# Patient Record
Sex: Female | Born: 1935 | Race: White | Hispanic: No | Smoking: Never smoker
Health system: Southern US, Community
[De-identification: ages and names within clinical notes are randomized; demographics above are authoritative.]

## PROBLEM LIST (undated history)

## (undated) DIAGNOSIS — R32 Unspecified urinary incontinence: Secondary | ICD-10-CM

## (undated) DIAGNOSIS — R600 Localized edema: Secondary | ICD-10-CM

## (undated) DIAGNOSIS — K559 Vascular disorder of intestine, unspecified: Secondary | ICD-10-CM

## (undated) DIAGNOSIS — D649 Anemia, unspecified: Secondary | ICD-10-CM

## (undated) DIAGNOSIS — J309 Allergic rhinitis, unspecified: Secondary | ICD-10-CM

## (undated) DIAGNOSIS — C4491 Basal cell carcinoma of skin, unspecified: Secondary | ICD-10-CM

## (undated) DIAGNOSIS — Z7901 Long term (current) use of anticoagulants: Secondary | ICD-10-CM

## (undated) DIAGNOSIS — Z905 Acquired absence of kidney: Secondary | ICD-10-CM

## (undated) DIAGNOSIS — I5032 Chronic diastolic (congestive) heart failure: Secondary | ICD-10-CM

## (undated) DIAGNOSIS — H409 Unspecified glaucoma: Secondary | ICD-10-CM

## (undated) DIAGNOSIS — L120 Bullous pemphigoid: Secondary | ICD-10-CM

## (undated) DIAGNOSIS — I2510 Atherosclerotic heart disease of native coronary artery without angina pectoris: Secondary | ICD-10-CM

## (undated) DIAGNOSIS — I10 Essential (primary) hypertension: Secondary | ICD-10-CM

## (undated) DIAGNOSIS — I639 Cerebral infarction, unspecified: Secondary | ICD-10-CM

## (undated) DIAGNOSIS — I482 Chronic atrial fibrillation, unspecified: Secondary | ICD-10-CM

## (undated) DIAGNOSIS — E039 Hypothyroidism, unspecified: Secondary | ICD-10-CM

## (undated) DIAGNOSIS — I82409 Acute embolism and thrombosis of unspecified deep veins of unspecified lower extremity: Secondary | ICD-10-CM

## (undated) DIAGNOSIS — Z952 Presence of prosthetic heart valve: Secondary | ICD-10-CM

## (undated) DIAGNOSIS — D696 Thrombocytopenia, unspecified: Secondary | ICD-10-CM

## (undated) HISTORY — PX: LAPAROSCOPIC SMALL BOWEL RESECTION: SUR793

## (undated) HISTORY — PX: HERNIA REPAIR: SHX51

## (undated) HISTORY — DX: Localized edema: R60.0

## (undated) HISTORY — PX: OTHER SURGICAL HISTORY: SHX169

## (undated) HISTORY — PX: APPENDECTOMY: SHX54

## (undated) HISTORY — PX: ABDOMINAL HYSTERECTOMY: SHX81

---
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
## 1998-07-29 ENCOUNTER — Ambulatory Visit (HOSPITAL_COMMUNITY): Admission: RE | Admit: 1998-07-29 | Discharge: 1998-07-30 | Payer: Self-pay | Admitting: Cardiology

## 1998-12-30 ENCOUNTER — Ambulatory Visit (HOSPITAL_COMMUNITY): Admission: RE | Admit: 1998-12-30 | Discharge: 1998-12-30 | Payer: Self-pay | Admitting: Internal Medicine

## 1998-12-30 ENCOUNTER — Encounter: Payer: Self-pay | Admitting: Internal Medicine

## 1999-02-08 ENCOUNTER — Encounter: Payer: Self-pay | Admitting: General Surgery

## 1999-02-09 ENCOUNTER — Ambulatory Visit (HOSPITAL_COMMUNITY): Admission: RE | Admit: 1999-02-09 | Discharge: 1999-02-09 | Payer: Self-pay | Admitting: General Surgery

## 1999-04-25 DIAGNOSIS — Z952 Presence of prosthetic heart valve: Secondary | ICD-10-CM

## 1999-04-25 HISTORY — DX: Presence of prosthetic heart valve: Z95.2

## 1999-11-16 ENCOUNTER — Ambulatory Visit (HOSPITAL_COMMUNITY): Admission: RE | Admit: 1999-11-16 | Discharge: 1999-11-16 | Payer: Self-pay | Admitting: Cardiology

## 2000-01-23 HISTORY — PX: MITRAL VALVE REPLACEMENT: SHX147

## 2000-01-23 HISTORY — PX: CORONARY ARTERY BYPASS GRAFT: SHX141

## 2000-01-25 ENCOUNTER — Ambulatory Visit (HOSPITAL_COMMUNITY): Admission: RE | Admit: 2000-01-25 | Discharge: 2000-01-25 | Payer: Self-pay | Admitting: *Deleted

## 2000-02-14 ENCOUNTER — Encounter: Payer: Self-pay | Admitting: Surgery

## 2000-02-16 ENCOUNTER — Inpatient Hospital Stay (HOSPITAL_COMMUNITY): Admission: RE | Admit: 2000-02-16 | Discharge: 2000-02-22 | Payer: Self-pay | Admitting: Surgery

## 2000-02-16 ENCOUNTER — Encounter: Payer: Self-pay | Admitting: Surgery

## 2000-02-17 ENCOUNTER — Encounter: Payer: Self-pay | Admitting: Surgery

## 2000-02-18 ENCOUNTER — Encounter: Payer: Self-pay | Admitting: Surgery

## 2000-03-14 ENCOUNTER — Encounter: Admission: RE | Admit: 2000-03-14 | Discharge: 2000-03-14 | Payer: Self-pay | Admitting: Surgery

## 2000-03-14 ENCOUNTER — Encounter: Payer: Self-pay | Admitting: Surgery

## 2000-03-16 ENCOUNTER — Inpatient Hospital Stay (HOSPITAL_COMMUNITY): Admission: AD | Admit: 2000-03-16 | Discharge: 2000-03-20 | Payer: Self-pay | Admitting: Surgery

## 2000-03-17 ENCOUNTER — Encounter: Payer: Self-pay | Admitting: Surgery

## 2000-03-18 ENCOUNTER — Encounter: Payer: Self-pay | Admitting: Cardiothoracic Surgery

## 2000-03-19 ENCOUNTER — Encounter: Payer: Self-pay | Admitting: Cardiothoracic Surgery

## 2000-03-20 ENCOUNTER — Encounter: Payer: Self-pay | Admitting: Surgery

## 2000-03-27 ENCOUNTER — Encounter: Admission: RE | Admit: 2000-03-27 | Discharge: 2000-03-27 | Payer: Self-pay | Admitting: Surgery

## 2000-03-27 ENCOUNTER — Encounter: Payer: Self-pay | Admitting: Surgery

## 2000-04-10 ENCOUNTER — Encounter: Payer: Self-pay | Admitting: Surgery

## 2000-04-10 ENCOUNTER — Encounter: Admission: RE | Admit: 2000-04-10 | Discharge: 2000-04-10 | Payer: Self-pay | Admitting: Surgery

## 2002-01-16 ENCOUNTER — Ambulatory Visit (HOSPITAL_COMMUNITY): Admission: RE | Admit: 2002-01-16 | Discharge: 2002-01-16 | Payer: Self-pay | Admitting: *Deleted

## 2002-07-07 ENCOUNTER — Ambulatory Visit (HOSPITAL_BASED_OUTPATIENT_CLINIC_OR_DEPARTMENT_OTHER): Admission: RE | Admit: 2002-07-07 | Discharge: 2002-07-07 | Payer: Self-pay | Admitting: General Surgery

## 2003-04-01 ENCOUNTER — Inpatient Hospital Stay (HOSPITAL_COMMUNITY): Admission: EM | Admit: 2003-04-01 | Discharge: 2003-04-12 | Payer: Self-pay | Admitting: Emergency Medicine

## 2003-06-04 ENCOUNTER — Ambulatory Visit (HOSPITAL_COMMUNITY): Admission: RE | Admit: 2003-06-04 | Discharge: 2003-06-04 | Payer: Self-pay | Admitting: Cardiology

## 2010-07-22 ENCOUNTER — Other Ambulatory Visit: Payer: Self-pay | Admitting: Dermatology

## 2011-02-15 ENCOUNTER — Other Ambulatory Visit: Payer: Self-pay | Admitting: Dermatology

## 2012-02-23 HISTORY — PX: TRANSTHORACIC ECHOCARDIOGRAM: SHX275

## 2012-03-01 ENCOUNTER — Inpatient Hospital Stay (HOSPITAL_COMMUNITY): Payer: Medicare Other

## 2012-03-01 ENCOUNTER — Observation Stay (HOSPITAL_COMMUNITY)
Admission: EM | Admit: 2012-03-01 | Discharge: 2012-03-04 | Disposition: A | Discharge status: Home / Self Care | Payer: Medicare Other | Attending: Internal Medicine | Admitting: Internal Medicine

## 2012-03-01 ENCOUNTER — Encounter (HOSPITAL_COMMUNITY): Payer: Self-pay | Admitting: Emergency Medicine

## 2012-03-01 DIAGNOSIS — L0390 Cellulitis, unspecified: Secondary | ICD-10-CM

## 2012-03-01 DIAGNOSIS — Z951 Presence of aortocoronary bypass graft: Secondary | ICD-10-CM | POA: Insufficient documentation

## 2012-03-01 DIAGNOSIS — Z7901 Long term (current) use of anticoagulants: Secondary | ICD-10-CM

## 2012-03-01 DIAGNOSIS — I482 Chronic atrial fibrillation, unspecified: Secondary | ICD-10-CM | POA: Diagnosis present

## 2012-03-01 DIAGNOSIS — L120 Bullous pemphigoid: Secondary | ICD-10-CM

## 2012-03-01 DIAGNOSIS — L03115 Cellulitis of right lower limb: Secondary | ICD-10-CM

## 2012-03-01 DIAGNOSIS — C4491 Basal cell carcinoma of skin, unspecified: Secondary | ICD-10-CM

## 2012-03-01 DIAGNOSIS — K559 Vascular disorder of intestine, unspecified: Secondary | ICD-10-CM

## 2012-03-01 DIAGNOSIS — Z905 Acquired absence of kidney: Secondary | ICD-10-CM | POA: Insufficient documentation

## 2012-03-01 DIAGNOSIS — I82409 Acute embolism and thrombosis of unspecified deep veins of unspecified lower extremity: Secondary | ICD-10-CM

## 2012-03-01 DIAGNOSIS — I4891 Unspecified atrial fibrillation: Secondary | ICD-10-CM

## 2012-03-01 DIAGNOSIS — K219 Gastro-esophageal reflux disease without esophagitis: Secondary | ICD-10-CM | POA: Insufficient documentation

## 2012-03-01 DIAGNOSIS — R32 Unspecified urinary incontinence: Secondary | ICD-10-CM

## 2012-03-01 DIAGNOSIS — I10 Essential (primary) hypertension: Secondary | ICD-10-CM | POA: Insufficient documentation

## 2012-03-01 DIAGNOSIS — H409 Unspecified glaucoma: Secondary | ICD-10-CM

## 2012-03-01 DIAGNOSIS — J309 Allergic rhinitis, unspecified: Secondary | ICD-10-CM

## 2012-03-01 DIAGNOSIS — Z952 Presence of prosthetic heart valve: Secondary | ICD-10-CM

## 2012-03-01 DIAGNOSIS — L129 Pemphigoid, unspecified: Secondary | ICD-10-CM | POA: Insufficient documentation

## 2012-03-01 DIAGNOSIS — Z79899 Other long term (current) drug therapy: Secondary | ICD-10-CM | POA: Insufficient documentation

## 2012-03-01 DIAGNOSIS — E079 Disorder of thyroid, unspecified: Secondary | ICD-10-CM

## 2012-03-01 DIAGNOSIS — Z954 Presence of other heart-valve replacement: Secondary | ICD-10-CM | POA: Insufficient documentation

## 2012-03-01 DIAGNOSIS — E039 Hypothyroidism, unspecified: Secondary | ICD-10-CM | POA: Diagnosis present

## 2012-03-01 DIAGNOSIS — R509 Fever, unspecified: Secondary | ICD-10-CM

## 2012-03-01 DIAGNOSIS — D649 Anemia, unspecified: Secondary | ICD-10-CM

## 2012-03-01 DIAGNOSIS — L02419 Cutaneous abscess of limb, unspecified: Principal | ICD-10-CM | POA: Insufficient documentation

## 2012-03-01 DIAGNOSIS — Z86718 Personal history of other venous thrombosis and embolism: Secondary | ICD-10-CM | POA: Insufficient documentation

## 2012-03-01 DIAGNOSIS — L03119 Cellulitis of unspecified part of limb: Principal | ICD-10-CM | POA: Insufficient documentation

## 2012-03-01 DIAGNOSIS — I2510 Atherosclerotic heart disease of native coronary artery without angina pectoris: Secondary | ICD-10-CM | POA: Insufficient documentation

## 2012-03-01 DIAGNOSIS — I509 Heart failure, unspecified: Secondary | ICD-10-CM

## 2012-03-01 HISTORY — DX: Basal cell carcinoma of skin, unspecified: C44.91

## 2012-03-01 HISTORY — DX: Essential (primary) hypertension: I10

## 2012-03-01 HISTORY — DX: Vascular disorder of intestine, unspecified: K55.9

## 2012-03-01 HISTORY — DX: Unspecified urinary incontinence: R32

## 2012-03-01 HISTORY — DX: Atherosclerotic heart disease of native coronary artery without angina pectoris: I25.10

## 2012-03-01 HISTORY — DX: Acquired absence of kidney: Z90.5

## 2012-03-01 HISTORY — DX: Long term (current) use of anticoagulants: Z79.01

## 2012-03-01 HISTORY — DX: Presence of prosthetic heart valve: Z95.2

## 2012-03-01 HISTORY — DX: Unspecified glaucoma: H40.9

## 2012-03-01 HISTORY — DX: Hypothyroidism, unspecified: E03.9

## 2012-03-01 HISTORY — DX: Acute embolism and thrombosis of unspecified deep veins of unspecified lower extremity: I82.409

## 2012-03-01 HISTORY — DX: Allergic rhinitis, unspecified: J30.9

## 2012-03-01 HISTORY — DX: Bullous pemphigoid: L12.0

## 2012-03-01 LAB — CBC WITH DIFFERENTIAL/PLATELET
Basophils Absolute: 0 10*3/uL (ref 0.0–0.1)
Basophils Relative: 0 % (ref 0–1)
Eosinophils Absolute: 0 10*3/uL (ref 0.0–0.7)
Eosinophils Relative: 0 % (ref 0–5)
HCT: 39.2 % (ref 36.0–46.0)
Hemoglobin: 13.7 g/dL (ref 12.0–15.0)
Lymphocytes Relative: 6 % — ABNORMAL LOW (ref 12–46)
Lymphs Abs: 0.7 10*3/uL (ref 0.7–4.0)
MCH: 31.2 pg (ref 26.0–34.0)
MCHC: 34.9 g/dL (ref 30.0–36.0)
MCV: 89.3 fL (ref 78.0–100.0)
Monocytes Absolute: 0.9 10*3/uL (ref 0.1–1.0)
Monocytes Relative: 8 % (ref 3–12)
Neutro Abs: 9.7 10*3/uL — ABNORMAL HIGH (ref 1.7–7.7)
Neutrophils Relative %: 86 % — ABNORMAL HIGH (ref 43–77)
Platelets: 159 10*3/uL (ref 150–400)
RBC: 4.39 MIL/uL (ref 3.87–5.11)
RDW: 13.6 % (ref 11.5–15.5)
WBC: 11.3 10*3/uL — ABNORMAL HIGH (ref 4.0–10.5)

## 2012-03-01 LAB — COMPREHENSIVE METABOLIC PANEL
ALT: 20 U/L (ref 0–35)
AST: 33 U/L (ref 0–37)
Albumin: 4 g/dL (ref 3.5–5.2)
Alkaline Phosphatase: 62 U/L (ref 39–117)
BUN: 15 mg/dL (ref 6–23)
CO2: 28 mEq/L (ref 19–32)
Calcium: 9.6 mg/dL (ref 8.4–10.5)
Chloride: 96 mEq/L (ref 96–112)
Creatinine, Ser: 0.83 mg/dL (ref 0.50–1.10)
GFR calc Af Amer: 72 mL/min — ABNORMAL LOW (ref >90)
GFR calc non Af Amer: 62 mL/min — ABNORMAL LOW (ref >90)
Glucose, Bld: 96 mg/dL (ref 70–99)
Potassium: 4 mEq/L (ref 3.5–5.1)
Sodium: 134 mEq/L — ABNORMAL LOW (ref 135–145)
Total Bilirubin: 0.8 mg/dL (ref 0.3–1.2)
Total Protein: 7.2 g/dL (ref 6.0–8.3)

## 2012-03-01 LAB — URINALYSIS, ROUTINE W REFLEX MICROSCOPIC
Bilirubin Urine: NEGATIVE
Ketones, ur: NEGATIVE mg/dL
Nitrite: NEGATIVE
pH: 7.5 (ref 5.0–8.0)

## 2012-03-01 LAB — PROTIME-INR
INR: 3.33 — ABNORMAL HIGH (ref 0.00–1.49)
Prothrombin Time: 31.9 seconds — ABNORMAL HIGH (ref 11.6–15.2)

## 2012-03-01 LAB — LACTIC ACID, PLASMA: Lactic Acid, Venous: 1.3 mmol/L (ref 0.5–2.2)

## 2012-03-01 LAB — URINE MICROSCOPIC-ADD ON

## 2012-03-01 MED ORDER — ATENOLOL 50 MG PO TABS
50.0000 mg | ORAL_TABLET | Freq: Every day | ORAL | Status: DC
  Administered 2012-03-02 – 2012-03-04 (×3): 50 mg via ORAL
  Filled 2012-03-01 (×3): qty 1

## 2012-03-01 MED ORDER — POTASSIUM CHLORIDE CRYS ER 20 MEQ PO TBCR
20.0000 meq | EXTENDED_RELEASE_TABLET | Freq: Every day | ORAL | Status: DC
  Filled 2012-03-01: qty 1

## 2012-03-01 MED ORDER — ONDANSETRON HCL 4 MG PO TABS: 4.0000 mg | ORAL_TABLET | Freq: Four times a day (QID) | ORAL | Status: DC | PRN

## 2012-03-01 MED ORDER — FUROSEMIDE 20 MG PO TABS
20.0000 mg | ORAL_TABLET | Freq: Every day | ORAL | Status: DC
  Administered 2012-03-02 – 2012-03-04 (×3): 20 mg via ORAL
  Filled 2012-03-01 (×3): qty 1

## 2012-03-01 MED ORDER — CLINDAMYCIN PHOSPHATE 900 MG/50ML IV SOLN
900.0000 mg | Freq: Three times a day (TID) | INTRAVENOUS | Status: DC
  Administered 2012-03-02 – 2012-03-04 (×7): 900 mg via INTRAVENOUS
  Filled 2012-03-01 (×10): qty 50

## 2012-03-01 MED ORDER — FOLIC ACID 400 MCG PO TABS: 400.0000 ug | ORAL_TABLET | Freq: Every day | ORAL | Status: DC

## 2012-03-01 MED ORDER — FOLIC ACID 0.5 MG HALF TAB
0.5000 mg | ORAL_TABLET | Freq: Every day | ORAL | Status: DC
  Administered 2012-03-02 – 2012-03-04 (×3): 0.5 mg via ORAL
  Filled 2012-03-01 (×3): qty 1

## 2012-03-01 MED ORDER — TRAMADOL HCL 50 MG PO TABS: 50.0000 mg | ORAL_TABLET | Freq: Four times a day (QID) | ORAL | Status: DC | PRN

## 2012-03-01 MED ORDER — ONDANSETRON HCL 4 MG/2ML IJ SOLN: 4.0000 mg | Freq: Three times a day (TID) | INTRAMUSCULAR | Status: DC | PRN

## 2012-03-01 MED ORDER — DOCUSATE SODIUM 100 MG PO CAPS
100.0000 mg | ORAL_CAPSULE | Freq: Two times a day (BID) | ORAL | Status: DC
  Administered 2012-03-01 – 2012-03-04 (×6): 100 mg via ORAL
  Filled 2012-03-01 (×7): qty 1

## 2012-03-01 MED ORDER — LATANOPROST 0.005 % OP SOLN
1.0000 [drp] | Freq: Every day | OPHTHALMIC | Status: DC
  Administered 2012-03-01: 1 [drp] via OPHTHALMIC
  Filled 2012-03-01: qty 2.5

## 2012-03-01 MED ORDER — ACETAMINOPHEN 325 MG PO TABS
650.0000 mg | ORAL_TABLET | Freq: Four times a day (QID) | ORAL | Status: DC | PRN
  Administered 2012-03-01 (×2): 650 mg via ORAL
  Filled 2012-03-01 (×2): qty 2

## 2012-03-01 MED ORDER — CALCIUM CARBONATE-VITAMIN D 500-200 MG-UNIT PO TABS
1.0000 | ORAL_TABLET | Freq: Every day | ORAL | Status: DC
  Administered 2012-03-02 – 2012-03-04 (×3): 1 via ORAL
  Filled 2012-03-01 (×3): qty 1

## 2012-03-01 MED ORDER — LEVOTHYROXINE SODIUM 25 MCG PO TABS
25.0000 ug | ORAL_TABLET | Freq: Every day | ORAL | Status: DC
  Administered 2012-03-02 – 2012-03-04 (×3): 25 ug via ORAL
  Filled 2012-03-01 (×5): qty 1

## 2012-03-01 MED ORDER — CLINDAMYCIN PHOSPHATE 900 MG/50ML IV SOLN
900.0000 mg | Freq: Once | INTRAVENOUS | Status: AC
  Administered 2012-03-01: 900 mg via INTRAVENOUS
  Filled 2012-03-01: qty 50

## 2012-03-01 MED ORDER — VANCOMYCIN HCL 500 MG IV SOLR
500.0000 mg | Freq: Two times a day (BID) | INTRAVENOUS | Status: DC
  Administered 2012-03-02 – 2012-03-04 (×5): 500 mg via INTRAVENOUS
  Filled 2012-03-01 (×6): qty 500

## 2012-03-01 MED ORDER — WARFARIN 0.5 MG HALF TABLET: 0.5000 mg | ORAL_TABLET | ORAL | Status: DC

## 2012-03-01 MED ORDER — MINOCYCLINE HCL 100 MG PO CAPS
100.0000 mg | ORAL_CAPSULE | Freq: Every day | ORAL | Status: DC
  Filled 2012-03-01: qty 1

## 2012-03-01 MED ORDER — VANCOMYCIN HCL IN DEXTROSE 1-5 GM/200ML-% IV SOLN
1000.0000 mg | INTRAVENOUS | Status: AC
  Administered 2012-03-01: 1000 mg via INTRAVENOUS
  Filled 2012-03-01: qty 200

## 2012-03-01 MED ORDER — NIACINAMIDE 500 MG PO TABS
500.0000 mg | ORAL_TABLET | Freq: Two times a day (BID) | ORAL | Status: DC
  Administered 2012-03-02 – 2012-03-04 (×5): 500 mg via ORAL

## 2012-03-01 MED ORDER — AMLODIPINE BESYLATE 5 MG PO TABS
5.0000 mg | ORAL_TABLET | Freq: Every day | ORAL | Status: DC
  Administered 2012-03-02 – 2012-03-04 (×3): 5 mg via ORAL
  Filled 2012-03-01 (×3): qty 1

## 2012-03-01 MED ORDER — MAGNESIUM HYDROXIDE 400 MG/5ML PO SUSP
30.0000 mL | Freq: Every day | ORAL | Status: DC | PRN
  Filled 2012-03-01: qty 30

## 2012-03-01 MED ORDER — DIGOXIN 250 MCG PO TABS
0.2500 mg | ORAL_TABLET | Freq: Every day | ORAL | Status: DC
Start: 2012-03-02 — End: 2012-03-04
  Administered 2012-03-02 – 2012-03-04 (×3): 0.25 mg via ORAL
  Filled 2012-03-01 (×3): qty 1

## 2012-03-01 MED ORDER — ADULT MULTIVITAMIN W/MINERALS CH
1.0000 | ORAL_TABLET | Freq: Every day | ORAL | Status: DC
  Administered 2012-03-02 – 2012-03-04 (×3): 1 via ORAL
  Filled 2012-03-01 (×3): qty 1

## 2012-03-01 MED ORDER — ONDANSETRON HCL 4 MG/2ML IJ SOLN: 4.0000 mg | Freq: Four times a day (QID) | INTRAMUSCULAR | Status: DC | PRN

## 2012-03-01 NOTE — H&P (Addendum)
[facility] History and Physical  Amanda Richardson ZOX:[id number] DOB: 1923-09-23 DOA: 03/01/2012  Referring physician: Dr. Juleen China PCP: Amanda Fick, MD Dr. Alesia Richardson  Chief Complaint: Cellulitis  HPI:   The patient is an 3-yo F with history of CAD, CHF, Mitral valve replacement, HTN, atrial fibrillation, hypothyroidism and bullous pemphigoid who presents with right lower extremity pain and redness and swelling.  The patient states that she has had long standing problems with her legs, including chronic lower extremity edema which has been getting progressively worse over the last year, particularly over the last month, and bullae secondary to bullous pemphigoid which have been worse over the last 4-5 months since she decreased her dose of minocycline.  She states she was in her normal state of health about one week ago, except for increased lower extremity swelling.  7 days ago, she cut her right leg on a branch of a plant, possibly a rose bush.  She had some bleeding from the area that stopped quickly, but since that cut happened, she has had continuous and increasing serous drainage from her right medial leg.  The area appeared to be healing some when she woke up this morning because she had decrease serous drainage, then at 10:30AM, she developed a bee sting like pain on the same area and she noticed it was a little red.  Over the course of the next few hours, she developed rapid progression of swelling, redness, and pain of the right leg with increased serous drainage from the ulcerated area.  Her symptoms have been associated with fevers, chills, and rigors.  She presented to her PCP who sent her to the ER for antibiotics.    In the ER, she was febrile to 102.22F.  She had blood cultures, CBC notable for elevated neutrophils 89%, and she was given vancomycin and clindamycin.  Per family, her leg already looks less red.    Review of Systems:   Chronic hearing loss.  Wears glasses.   Denies sinus congestions, sore throat, cough, shortness of breath, chest pain, nausea, vomiting, diarrhea.  She has constipation and takes prune juice.  Denies blood in stools.  Denies dysuria, but has had urgency, frequency today.  Denies lymphadenopathy.  Easy bruising secondary to warfarin.  No DOE, PND, orthopnea.  Denies focal weakness, numbness, anxiety, and depression.  She has some urinary incontinence and polyuria.     Past Medical History  Diagnosis Date  . H/O mitral valve replacement     mechanical heart valve.  Family states indication may have been MR from MVP or  rheumatic heart disease  . Thyroid disease   . Hypertension   . CHF (congestive heart failure)   . S/p nephrectomy     left  . Coronary artery disease   . H/O coronary artery bypass surgery   . 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 Urinary incontinence   . Atrial fibrillation   . DVT (deep venous thrombosis)     possible in 1990s and does not remember being on blood thinner  . Hypothyroidism   . Chronic anemia     heavy periods  . Glaucoma   . Bullous pemphigoid   . Chronic anticoagulation   . Basal cell carcinoma   . Allergic rhinitis    Past Surgical History  Procedure Date  . Abdominal hysterectomy 1960s  . Laparoscopic small bowel resection 2004 or 5    adhesions and ischemic bowel  . Mitral valve replacement  01/2000    mechanical heart valve  . Coronary artery bypass graft 01/2000  . Appendectomy 1960s  . Tonsillectomy 1933  . Hx broken collarbone    Social History:  reports that she has never smoked. She does not have any smokeless tobacco history on file. She reports that she does not drink alcohol or use illicit drugs. Two daughters, Amanda Richardson and [name]  Lives alone in a house with some steps near the mailbox, but otherwise single story.  Does not use a cane or walker or other assist device.  Active and gardens a lot.  Completes ADLS without assistance.    Allergies  Allergen  Reactions  . Heparin Anaphylaxis    Family History  Problem Relation Age of Onset  . Irregular heart beat Mother   . Other Mother     pellegra  . Heart failure Mother     had very swollen legs??  . Heart disease Brother   . Heart attack Brother 40    still alive  . Prostate cancer Father   . Heart attack Daughter     Prior to Admission medications   Medication Sig Start Date End Date Taking? Authorizing Provider  amLODipine (NORVASC) 5 MG tablet Take 5 mg by mouth daily.   Yes Historical Provider, MD  atenolol (TENORMIN) 50 MG tablet Take 50 mg by mouth daily.   Yes Historical Provider, MD  calcium-vitamin D (OSCAL WITH D) 500-200 MG-UNIT per tablet Take 1 tablet by mouth daily.   Yes Historical Provider, MD  Cholecalciferol (VITAMIN D-3) 1000 UNITS CAPS Take 1 capsule by mouth 2 (two) times daily.   Yes Historical Provider, MD  digoxin (LANOXIN) 0.25 MG tablet Take 0.25 mg by mouth daily.   Yes Historical Provider, MD  folic acid (FOLVITE) 400 MCG tablet Take 400 mcg by mouth daily.   Yes Historical Provider, MD  furosemide (LASIX) 20 MG tablet Take 20 mg by mouth 2 (two) times daily.   Yes Historical Provider, MD  latanoprost (XALATAN) 0.005 % ophthalmic solution Place 1 drop into both eyes at bedtime.   Yes Historical Provider, MD  levothyroxine (SYNTHROID, LEVOTHROID) 25 MCG tablet Take 25 mcg by mouth daily.   Yes Historical Provider, MD  minocycline (MINOCIN,DYNACIN) 100 MG capsule Take 100 mg by mouth daily.   Yes Historical Provider, MD  Multiple Vitamin (MULTIVITAMIN WITH MINERALS) TABS Take 1 tablet by mouth daily.   Yes Historical Provider, MD  niacinamide 500 MG tablet Take 500 mg by mouth 3 (three) times daily with meals.   Yes Historical Provider, MD  potassium chloride SA (K-DUR,KLOR-CON) 20 MEQ tabletch Take 20 mEq by mouth daily.   Yes Historical Provider, MD  warfarin (COUMADIN) 1 MG tablet Take 0.5-1 mg by mouth as directed. Take 1 mg everyday except on Saturday.  Take .5 mg on Saturday   Yes Historical Provider, MD   Physical Exam: Filed Vitals:   03/01/12 1442 03/01/12 1530 03/01/12 1635  BP: 147/124 143/94   Pulse: 67 70   Temp: 99.7 F (37.6 C) 102.7 F (39.3 C) 100.3 F (37.9 C)  TempSrc: Oral Rectal Oral  Resp: 16 20   SpO2: 93% 97%      General:  Caucasian female, no acute distress, lying on stretcher  Eyes: PERRL, anicteric, noninjected, EOMI  ENT: NCAT, nares erythematous with friable mucosa, OP nonerythematous, no exudate or plaque.  MMM  Neck: supple, no thyromegaly or nodules  Lymph: Shotty cervical LN on right, none on left, no  submandibular or supraclavicular LN  Cardiovascular: irregularly irregular, 2/6 murmur at LSB, S1 with mechanical click over apex  Respiratory: CTAB, no increased WOB  Abdomen: NABS, soft, nondistended, nontender, no organomegaly  Skin:  Erythema of the RLE marked with surgical marker.  Bright erythema extends midway up shin and to dorsum of foot with associated edema.  Lighter erythema extends to a couple inches below the knee.  Area of medial ankle with serous drainage and pea-size ulcer that is exquisitely painful with surrounding purple discoloration of skin several cm in radius.  Several crescent shaped healing scabs on the left shin from previous ulcers  Musculoskeletal: Normal tone and bulk  Psychiatric: A&Ox4  Neurologic:  III-XII grossly intact, strength 5/5, sensation intact to light touch.    Labs on Admission:  Basic Metabolic Panel:  Lab 03/01/12 6962  NA 134*  K 4.0  CL 96  CO2 28  GLUCOSE 96  BUN 15  CREATININE 0.83  CALCIUM 9.6  MG --  PHOS --   Liver Function Tests:  Lab 03/01/12 1550  AST 33  ALT 20  ALKPHOS 62  BILITOT 0.8  PROT 7.2  ALBUMIN 4.0   No results found for this basename: LIPASE:5,AMYLASE:5 in the last 168 hours No results found for this basename: AMMONIA:5 in the last 168 hours CBC:  Lab 03/01/12 1550  WBC 11.3*  NEUTROABS 9.7*  HGB 13.7   HCT 39.2  MCV 89.3  PLT 159   Cardiac Enzymes: No results found for this basename: CKTOTAL:5,CKMB:5,CKMBINDEX:5,TROPONINI:5 in the last 168 hours  BNP (last 3 results) No results found for this basename: PROBNP:3 in the last 8760 hours CBG: No results found for this basename: GLUCAP:5 in the last 168 hours  Radiological Exams on Admission: No results found.  EKG:  Ordered and pending.    Assessment/Plan Principal Problem:  *Cellulitis Active Problems:  H/O mitral valve replacement  Hypertension  CHF (congestive heart failure)  Coronary artery disease  Atrial fibrillation  Hypothyroidism  Bullous pemphigoid  Fever   Fever and rigors, likely due to right lower extremity cellulitis.  Patient does not have respiratory symptoms to suggest pneumonia.  She has had some urinary frequency and urgency, however.  She is at risk for endocarditis given her artificial heart valve.  Concerning that systemic symptoms started almost at the same time that she noticed the pain and redness in her leg, suggesting toxin production.  Also concerning that symptoms progressed rapidly.  She did have a thorn prick a few days ago, possible by a rose bush, but she appears to be getting better on vancomycin and clindamycin.  She does not have diabetes to predispose to PSA infection.  Given her chronic well-managed A/C and lack of recent stasis, unlikely that she developed acute DVT.  Will defer ordering duplex at this time.  She is on minocycline chronically as outpatient for bullous pemphigoid. -  F/u blood cultures -  Since there is copious drainage from the right leg ulcer, will attempt to culture to enable a more tapered Abx regimen -  Urinalysis and urine culture -  ECHO -  X-ray of medial leg/ankle without definitive subcutaneous gas, but granular opacities.  Clinically improving.  Surgery consult if worsens or stops improving.     -  Continue vancomycin with clindamycin for toxin suppression.   -   Tylenol and ultram for pain control  Bullous pemphigoid, worsening -  Continue minocycline and niacinamide -  Wound care consult for medial right leg  CAD, CHF, atrial fibrillation and MVR.  Blood pressure elevated.  She has had increased lower extremity edema without other symptoms of CHF.  She is in a-fib but rate controlled on heart monitor.   -  Continue norvasc and atenolol -  Continue digoxin, send level -  Continue lasix and potassium -  INR check -  Coumadin per pharmacy -  ECHO -  Check for proteinuria on UA as alternative dx to CHF for lower extremity edema, esp given one kidney  Hypothyroidism, stable.  Continue synthroid Glaucoma, stable.  Continue latanoprost  DIET:  Healthy heart ACCESS:  PIV IVF:  None PROPH:  SCDs (not to right leg)  Anaphylaxis to heparin  Code Status: Full code Family Communication: spoke with patient and daughter Amanda Richardson who was at bedside, [phone], [phone].  [name] is other daughter, [phone] Disposition Plan: Pending improvement in lower extremity cellulitis  Time spent: 26  Harley Mccartney [facility] Pager [phone]  If 7PM-7AM, please contact night-coverage [email or web address] Password [facility] 03/01/2012, 7:17 PM

## 2012-03-01 NOTE — Progress Notes (Signed)
[facility] ED CM spoke with pt and confirmed pcp Pt and female at bedside reports at 2 pm 03/01/12 pt was seen by Dr Emily Filbert, dermatologist's associate after her injury on legs did not respond to her attempts at home care remedies for "near a week"

## 2012-03-01 NOTE — ED Notes (Signed)
Pt presenting to ed with c/o sent by pcp for possible leg infection. Pt states positive fever and chills. Pt states her leg has been an ongoing problem. Pt states she has had drainage from bilateral legs. Pt with bandage to right leg

## 2012-03-01 NOTE — Progress Notes (Signed)
Updated EPIC with pt pcp Ramachandran per H&P note

## 2012-03-01 NOTE — ED Provider Notes (Signed)
History    78 year old female with right lower extremity pain and fever. Onset was about a day ago and progressively worsening. Patient has a history of bullous pemphigoid and often gets bullae particualry of LE, but this different. Patient also reports that she was stuck by a thorn in the same region while she was doing some gardening.   CSN: [id number]  Arrival date & time 03/01/12  1437   First MD Initiated Contact with Patient 03/01/12 1555      Chief Complaint  Patient presents with  . Fever    (Consider location/radiation/quality/duration/timing/severity/associated sxs/prior treatment) HPI  Past Medical History  Diagnosis Date  . H/O mitral valve replacement   . Thyroid disease   . Hypertension     Past Surgical History  Procedure Date  . Abdominal hysterectomy     No family history on file.  History  Substance Use Topics  . Smoking status: Never Smoker   . Smokeless tobacco: Not on file  . Alcohol Use: No    OB History    Grav Para Term Preterm Abortions TAB SAB Ect Mult Living                  Review of Systems   Review of symptoms negative unless otherwise noted in HPI.   Allergies  Heparin  Home Medications   Current Outpatient Rx  Name  Route  Sig  Dispense  Refill  . AMLODIPINE BESYLATE 5 MG PO TABS   Oral   Take 5 mg by mouth daily.         . ATENOLOL 50 MG PO TABS   Oral   Take 50 mg by mouth daily.         Marland Kitchen CALCIUM CARBONATE-VITAMIN D 500-200 MG-UNIT PO TABS   Oral   Take 1 tablet by mouth daily.         Marland Kitchen VITAMIN D-3 1000 UNITS PO CAPS   Oral   Take 1 capsule by mouth 2 (two) times daily.         Marland Kitchen DIGOXIN 0.25 MG PO TABS   Oral   Take 0.25 mg by mouth daily.         Marland Kitchen FOLIC ACID 400 MCG PO TABS   Oral   Take 400 mcg by mouth daily.         . FUROSEMIDE 20 MG PO TABS   Oral   Take 20 mg by mouth 2 (two) times daily.         Marland Kitchen LATANOPROST 0.005 % OP SOLN   Both Eyes   Place 1 drop into both eyes at  bedtime.         Marland Kitchen LEVOTHYROXINE SODIUM 25 MCG PO TABS   Oral   Take 25 mcg by mouth daily.         Marland Kitchen MINOCYCLINE HCL 100 MG PO CAPS   Oral   Take 100 mg by mouth daily.         . ADULT MULTIVITAMIN W/MINERALS CH   Oral   Take 1 tablet by mouth daily.         Marland Kitchen NIACINAMIDE 500 MG PO TABS   Oral   Take 500 mg by mouth 3 (three) times daily with meals.         Marland Kitchen POTASSIUM CHLORIDE CRYS ER 20 MEQ PO TBCR   Oral   Take 20 mEq by mouth daily.         . WARFARIN SODIUM 1 MG  PO TABS   Oral   Take 0.5-1 mg by mouth as directed. Take 1 mg everyday except on Saturday. Take .5 mg on Saturday           BP 143/94  Pulse 70  Temp 102.7 F (39.3 C) (Rectal)  Resp 20  SpO2 97%  Physical Exam  Nursing note and vitals reviewed. Constitutional: She appears well-developed and well-nourished. No distress.  HENT:  Head: Normocephalic and atraumatic.  Eyes: Conjunctivae normal are normal. Right eye exhibits no discharge. Left eye exhibits no discharge.  Neck: Neck supple.  Cardiovascular: Normal rate, regular rhythm and normal heart sounds.  Exam reveals no gallop and no friction rub.   No murmur heard. Pulmonary/Chest: Effort normal and breath sounds normal. No respiratory distress.  Abdominal: Soft. She exhibits no distension. There is no tenderness.  Musculoskeletal: She exhibits no edema and no tenderness.  Neurological: She is alert.  Skin:       Swelling, erythema, increased warmth consistent with cellulitis of LLE in area of medial/distal leg. NVI distally. No external evidence of drainable collection. No significantly increased pain with ROM or ankle to suggest septic joint.  Psychiatric: She has a normal mood and affect. Her behavior is normal. Thought content normal.    ED Course  Procedures (including critical care time)  Labs Reviewed  CBC WITH DIFFERENTIAL - Abnormal; Notable for the following:    WBC 11.3 (*)     Neutrophils Relative 86 (*)     Neutro  Abs 9.7 (*)     Lymphocytes Relative 6 (*)     All other components within normal limits  COMPREHENSIVE METABOLIC PANEL - Abnormal; Notable for the following:    Sodium 134 (*)     GFR calc non Af Amer 62 (*)     GFR calc Af Amer 72 (*)     All other components within normal limits  CULTURE, BLOOD (ROUTINE X 2)  CULTURE, BLOOD (ROUTINE X 2)  LACTIC ACID, PLASMA  PROTIME-INR   No results found.   1. Cellulitis of right lower leg   2. H/O mitral valve replacement   3. Thyroid disease   4. Hypertension   5. CHF (congestive heart failure)   6. S/p nephrectomy   7. Coronary artery disease   8. H/O coronary artery bypass surgery   9. Ischemic bowel disease   10. Urinary incontinence   11. Atrial fibrillation   12. DVT (deep venous thrombosis)   13. Hypothyroidism   14. Chronic anemia   15. Glaucoma   16. Bullous pemphigoid   17. Chronic anticoagulation   18. Basal cell carcinoma   19. Allergic rhinitis   20. Cellulitis   21. Fever       MDM  88yF with cellulitis of RLE. Febrile. IV vancomycin ordered. Given hx of stuck by thorn, clindamycin added for possible soil contamination. Consider sporothrix, but doubt. Discussed with hospitalist for admission.          Raeford Razor, MD 03/05/12 [phone]

## 2012-03-01 NOTE — Progress Notes (Addendum)
ANTICOAGULATION CONSULT NOTE - Initial Consult  Pharmacy Consult for Warfarin  Indication: Mechanical valve replacement   Allergies  Allergen Reactions  . Heparin Anaphylaxis    Patient Measurements: Height: 5\' 4"  (162.6 cm) Weight: 128 lb (58.06 kg) IBW/kg (Calculated) : 54.7    Vital Signs: Temp: 100.1 F (37.8 C) (11/08 2040) Temp src: Oral (11/08 2040) BP: 143/94 mmHg (11/08 1530) Pulse Rate: 70  (11/08 1530)  Labs:  Basename 03/01/12 1550  HGB 13.7  HCT 39.2  PLT 159  APTT --  LABPROT --  INR --  HEPARINUNFRC --  CREATININE 0.83  CKTOTAL --  CKMB --  TROPONINI --    Estimated Creatinine Clearance: 42 ml/min (by C-G formula based on Cr of 0.83).   Medical History: Past Medical History  Diagnosis Date  . H/O mitral valve replacement     mechanical heart valve.  Family states indication may have been MR from MVP or  rheumatic heart disease  . Thyroid disease   . Hypertension   . CHF (congestive heart failure)   . S/p nephrectomy     left  . Coronary artery disease   . H/O coronary artery bypass surgery   . 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 Urinary incontinence   . Atrial fibrillation   . DVT (deep venous thrombosis)     possible in 1990s and does not remember being on blood thinner  . Hypothyroidism   . Chronic anemia     heavy periods  . Glaucoma   . Bullous pemphigoid   . Chronic anticoagulation   . Basal cell carcinoma   . Allergic rhinitis     Medications:  Scheduled:    . amLODipine  5 mg Oral Daily  . atenolol  50 mg Oral Daily  . calcium-vitamin D  1 tablet Oral Daily  . [COMPLETED] clindamycin (CLEOCIN) IV  900 mg Intravenous Once  . clindamycin (CLEOCIN) IV  900 mg Intravenous Q8H  . digoxin  0.25 mg Oral Daily  . docusate sodium  100 mg Oral BID  . folic acid  0.5 mg Oral Daily  . furosemide  20 mg Oral Daily  . latanoprost  1 drop Both Eyes QHS  . levothyroxine  25 mcg Oral QAC breakfast  . minocycline   100 mg Oral Daily  . multivitamin with minerals  1 tablet Oral Daily  . niacinamide  500 mg Oral BID WC  . potassium chloride SA  20 mEq Oral Daily  . vancomycin  500 mg Intravenous Q12H  . [COMPLETED] vancomycin  1,000 mg Intravenous To ER  . [DISCONTINUED] folic acid  400 mcg Oral Daily  . [DISCONTINUED] warfarin  0.5-1 mg Oral UD   Infusions:   PRN: acetaminophen, magnesium hydroxide, ondansetron (ZOFRAN) IV, ondansetron, traMADol, [DISCONTINUED] ondansetron (ZOFRAN) IV  Assessment:  78 yo F on chronic coumadin for mechanical valve replacement.  Higher INR goal of 2.5-3.5 given indication of mechanical valve.   Home dose = 1 mg daily except 0.5 mg on Saturdays.  Patient reports taking last dose today on 11/8   INR tonight was therapeutic at 3.33  CBC is stable, no bleeding reported   Goal of Therapy:  INR 2.5-3.5  Monitor platelets by anticoagulation protocol: Yes   Plan:  1.) INR is therapeutic and patient already received dose today, will f/u with AM INR 2.) Daily PT/INR 3.) Monitor CBC and s/sx bleeding   Iokepa Geffre, Loma Messing PharmD Pager #: [phone]  9:17 PM 03/01/2012

## 2012-03-02 DIAGNOSIS — L129 Pemphigoid, unspecified: Secondary | ICD-10-CM

## 2012-03-02 DIAGNOSIS — R509 Fever, unspecified: Secondary | ICD-10-CM

## 2012-03-02 LAB — PROTIME-INR
INR: 3.19 — ABNORMAL HIGH (ref 0.00–1.49)
Prothrombin Time: 30.9 seconds — ABNORMAL HIGH (ref 11.6–15.2)

## 2012-03-02 LAB — BASIC METABOLIC PANEL
BUN: 15 mg/dL (ref 6–23)
CO2: 25 mEq/L (ref 19–32)
Chloride: 100 mEq/L (ref 96–112)
Creatinine, Ser: 0.92 mg/dL (ref 0.50–1.10)
Glucose, Bld: 97 mg/dL (ref 70–99)
Potassium: 3.3 mEq/L — ABNORMAL LOW (ref 3.5–5.1)

## 2012-03-02 LAB — CBC
HCT: 35.8 % — ABNORMAL LOW (ref 36.0–46.0)
Hemoglobin: 12.1 g/dL (ref 12.0–15.0)
MCHC: 33.8 g/dL (ref 30.0–36.0)
MCV: 89.5 fL (ref 78.0–100.0)
RDW: 13.8 % (ref 11.5–15.5)

## 2012-03-02 MED ORDER — WARFARIN - PHARMACIST DOSING INPATIENT: Freq: Every day | Status: DC

## 2012-03-02 MED ORDER — WARFARIN 0.5 MG HALF TABLET
0.5000 mg | ORAL_TABLET | Freq: Once | ORAL | Status: AC
  Administered 2012-03-02: 0.5 mg via ORAL
  Filled 2012-03-02: qty 1

## 2012-03-02 MED ORDER — ALUM & MAG HYDROXIDE-SIMETH 200-200-20 MG/5ML PO SUSP
15.0000 mL | ORAL | Status: DC | PRN
  Administered 2012-03-02 – 2012-03-04 (×6): 15 mL via ORAL
  Filled 2012-03-02 (×6): qty 30

## 2012-03-02 MED ORDER — MINOCYCLINE HCL 100 MG PO CAPS
100.0000 mg | ORAL_CAPSULE | Freq: Two times a day (BID) | ORAL | Status: DC
  Administered 2012-03-02: 100 mg via ORAL
  Filled 2012-03-02 (×3): qty 1

## 2012-03-02 MED ORDER — POTASSIUM CHLORIDE CRYS ER 20 MEQ PO TBCR
40.0000 meq | EXTENDED_RELEASE_TABLET | Freq: Once | ORAL | Status: AC
  Administered 2012-03-02: 40 meq via ORAL
  Filled 2012-03-02: qty 2

## 2012-03-02 MED ORDER — PANTOPRAZOLE SODIUM 40 MG PO TBEC
40.0000 mg | DELAYED_RELEASE_TABLET | Freq: Every day | ORAL | Status: DC
  Administered 2012-03-02 – 2012-03-04 (×3): 40 mg via ORAL
  Filled 2012-03-02 (×3): qty 1

## 2012-03-02 NOTE — Progress Notes (Signed)
ANTICOAGULATION CONSULT NOTE - Follow Up  Pharmacy Consult for Warfarin  Indication: Mechanical Valve Replacement   Allergies  Allergen Reactions  . Heparin Anaphylaxis    Patient Measurements: Height: 5\' 4"  (162.6 cm) Weight: 128 lb 6.4 oz (58.242 kg) IBW/kg (Calculated) : 54.7    Vital Signs: Temp: 100.5 F (38.1 C) (11/09 0600) Temp src: Oral (11/09 0600) BP: 123/44 mmHg (11/09 0600) Pulse Rate: 66  (11/09 0600)  Labs:  Basename 03/02/12 0515 03/01/12 2129 03/01/12 1550  HGB 12.1 -- 13.7  HCT 35.8* -- 39.2  PLT 126* -- 159  APTT -- -- --  LABPROT 30.9* 31.9* --  INR 3.19* 3.33* --  HEPARINUNFRC -- -- --  CREATININE 0.92 -- 0.83  CKTOTAL -- -- --  CKMB -- -- --  TROPONINI -- -- --    Estimated Creatinine Clearance: 37.9 ml/min (by C-G formula based on Cr of 0.92).   Medical History: Past Medical History  Diagnosis Date  . H/O mitral valve replacement     mechanical heart valve.  Family states indication may have been MR from MVP or  rheumatic heart disease  . Thyroid disease   . Hypertension   . CHF (congestive heart failure)   . S/p nephrectomy     left  . Coronary artery disease   . H/O coronary artery bypass surgery   . 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 Urinary incontinence   . Atrial fibrillation   . DVT (deep venous thrombosis)     possible in 1990s and does not remember being on blood thinner  . Hypothyroidism   . Chronic anemia     heavy periods  . Glaucoma   . Bullous pemphigoid   . Chronic anticoagulation   . Basal cell carcinoma   . Allergic rhinitis     Medications:  Scheduled:     . amLODipine  5 mg Oral Daily  . atenolol  50 mg Oral Daily  . calcium-vitamin D  1 tablet Oral Daily  . [COMPLETED] clindamycin (CLEOCIN) IV  900 mg Intravenous Once  . clindamycin (CLEOCIN) IV  900 mg Intravenous Q8H  . digoxin  0.25 mg Oral Daily  . docusate sodium  100 mg Oral BID  . folic acid  0.5 mg Oral Daily  .  furosemide  20 mg Oral Daily  . latanoprost  1 drop Both Eyes QHS  . levothyroxine  25 mcg Oral QAC breakfast  . minocycline  100 mg Oral Daily  . multivitamin with minerals  1 tablet Oral Daily  . niacinamide  500 mg Oral BID WC  . potassium chloride SA  20 mEq Oral Daily  . vancomycin  500 mg Intravenous Q12H  . [COMPLETED] vancomycin  1,000 mg Intravenous To ER  . [DISCONTINUED] folic acid  400 mcg Oral Daily  . [DISCONTINUED] warfarin  0.5-1 mg Oral UD   Infusions:   PRN: acetaminophen, magnesium hydroxide, ondansetron (ZOFRAN) IV, ondansetron, traMADol, [DISCONTINUED] ondansetron (ZOFRAN) IV  Assessment:  78 yo F on chronic coumadin for mechanical valve replacement.  Higher INR goal of 2.5-3.5 given indication of mechanical valve.   Home dose = 1 mg daily except 0.5 mg on Saturdays.  Patient reports taking last dose today on 11/8   INR remains therapeutic this am - will continue usual home dosage  Platelets slightly low this am - monitor   Drug interaction with minocycline noted (home med)- possible increased anticoag effect - monitor  Goal  of Therapy:  INR 2.5-3.5  Monitor platelets by anticoagulation protocol: Yes   Plan:  1) Warfarin 0.5mg  PO x1 at 18:00 2) Daily PT/INR 3) Monitor CBC and s/sx bleeding   Darrol Angel, PharmD Pager: [phone] 6:59 AM 03/02/2012

## 2012-03-02 NOTE — Progress Notes (Signed)
  Echocardiogram 2D Echocardiogram has been performed.  Amanda Richardson 03/02/2012, 8:37 AM

## 2012-03-02 NOTE — Progress Notes (Signed)
[facility] PROGRESS NOTE  JANETH TERRY WUJ:[id number] DOB: [birth year] DOA: 03/01/2012 PCP: Georgianne Fick, MD  Assessment/Plan: Cellulitis - There is still a copious amount of drainage from the right leg culture negative so far.  Continue to wrap leg. - X-ray of medial leg/ankle without definitive subcutaneous gas, but granular opacities. Clinically improving.   - Continue vancomycin with clindamycin  - Tylenol and ultram for pain control   Bullous pemphigoid, worsening  - Continue minocycline and niacinamide  - Wound care consult for medial right leg   CAD, CHF, atrial fibrillation and MVR.  - She has had increased lower extremity edema without other symptoms of CHF. She is in a-fib but rate controlled on heart monitor.  - Continue norvasc and atenolol  - Continue digoxin  - Continue lasix and potassium  - Coumadin per pharmacy  - ECHO pending   Hypothyroidism  stable. Continue synthroid   Glaucoma  stable. Continue latanoprost  Code Status: Full code Family Communication: Discussed with patient and her family in the room. Disposition Plan: Home in 2-3 days  [name], MD  [facility] Pager [phone]  If 7PM-7AM, please contact night-coverage [email or web address] Password [email or web address] 03/02/2012, 10:52 AM   LOS: 1 day   Brief narrative: Patient is a a 57-yo F with history of CAD, CHF, Mitral valve replacement, HTN, atrial fibrillation, hypothyroidism and bullous pemphigoid who presents with right lower extremity pain and redness and swelling.  She was noted to have cellulitis of the extremity and started on antibiotics.  The cellulitis has improved.   Consultants:  None  Procedures:  none  Antibiotics:  Clinda and  Vanc ?  HPI/Subjective: Patient feels much better.  She thinks the redness of her right lower extremity is much improved.   Objective: Filed Vitals:   03/01/12 2030 03/01/12 2040 03/01/12 2115 03/02/12 0600  BP:   147/53 123/44    Pulse:   71 66  Temp:  100.1 F (37.8 C) 101.6 F (38.7 C) 100.5 F (38.1 C)  TempSrc:  Oral Oral Oral  Resp: 24  20 18   Height:  5\' 4"  (1.626 m)    Weight:  58.06 kg (128 lb)  58.242 kg (128 lb 6.4 oz)  SpO2:   99% 100%    Intake/Output Summary (Last 24 hours) at 03/02/12 1052 Last data filed at 03/02/12 0032  Gross per 24 hour  Intake      0 ml  Output    600 ml  Net   -600 ml    Exam:   General:  Pt is sitting up in bed, does not seem to be in any acute distress.  Cardiovascular: RRR  Respiratory: clear to ascultation bilaterally,  QMV:HQIO non-tender non distended positive bowel sounds.  Ext: pos for edema 1 plus  Bilaterally with improved erythema of the right lower extremity with some clear drainage Data Reviewed: Basic Metabolic Panel:  Lab 03/02/12 9629 03/01/12 1550  NA 134* 134*  K 3.3* 4.0  CL 100 96  CO2 25 28  GLUCOSE 97 96  BUN 15 15  CREATININE 0.92 0.83  CALCIUM 8.4 9.6  MG -- --  PHOS -- --   Liver Function Tests:  Lab 03/01/12 1550  AST 33  ALT 20  ALKPHOS 62  BILITOT 0.8  PROT 7.2  ALBUMIN 4.0   CBC:  Lab 03/02/12 0515 03/01/12 1550  WBC 17.3* 11.3*  NEUTROABS -- 9.7*  HGB 12.1 13.7  HCT 35.8* 39.2  MCV 89.5 89.3  PLT 126*  159     Recent Results (from the past 240 hour(s))  WOUND CULTURE     Status: Normal (Preliminary result)   Collection Time   2012/03/24 10:10 PM      Component Value Range Status Comment   Specimen Description LEG   Final    Special Requests Normal   Final    Gram Stain     Final    Value: NO WBC SEEN     NO SQUAMOUS EPITHELIAL CELLS SEEN     NO ORGANISMS SEEN   Culture PENDING   Incomplete    Report Status PENDING   Incomplete      Studies: Dg Ankle 2 Views Right  03-24-12  *RADIOLOGY REPORT*  Clinical Data: Cellulitis, rash  RIGHT ANKLE - 2 VIEW  Comparison: None.  Findings: Two views of the right ankle submitted.  No acute fracture or subluxation.  There is diffuse soft tissue swelling.  Small granular lucencies are noted within soft tissue.  Early subcutaneous emphysema cannot be excluded.  Clinical correlation is necessary.  However subcutaneous edema may have similar appearance.  IMPRESSION:  No acute fracture or subluxation.  There is diffuse soft tissue swelling.  Small granular lucencies are noted within soft tissue. Early subcutaneous emphysema cannot be excluded.  Clinical correlation is necessary.  However subcutaneous edema may have similar appearance.   Original Report Authenticated By: Natasha Mead, M.D.     Scheduled Meds:   . amLODipine  5 mg Oral Daily  . atenolol  50 mg Oral Daily  . calcium-vitamin D  1 tablet Oral Daily  . [COMPLETED] clindamycin (CLEOCIN) IV  900 mg Intravenous Once  . clindamycin (CLEOCIN) IV  900 mg Intravenous Q8H  . digoxin  0.25 mg Oral Daily  . docusate sodium  100 mg Oral BID  . folic acid  0.5 mg Oral Daily  . furosemide  20 mg Oral Daily  . latanoprost  1 drop Both Eyes QHS  . levothyroxine  25 mcg Oral QAC breakfast  . minocycline  100 mg Oral Daily  . multivitamin with minerals  1 tablet Oral Daily  . niacinamide  500 mg Oral BID WC  . potassium chloride  40 mEq Oral Once  . vancomycin  500 mg Intravenous Q12H  . [COMPLETED] vancomycin  1,000 mg Intravenous To ER  . warfarin  0.5 mg Oral ONCE-1800  . Warfarin - Pharmacist Dosing Inpatient   Does not apply q1800  . [DISCONTINUED] folic acid  400 mcg Oral Daily  . [DISCONTINUED] potassium chloride SA  20 mEq Oral Daily  . [DISCONTINUED] warfarin  0.5-1 mg Oral UD   Continuous Infusions:    Time Spent: 25 min

## 2012-03-03 LAB — BASIC METABOLIC PANEL
BUN: 14 mg/dL (ref 6–23)
Chloride: 101 mEq/L (ref 96–112)
Creatinine, Ser: 0.92 mg/dL (ref 0.50–1.10)
GFR calc Af Amer: 64 mL/min — ABNORMAL LOW (ref >90)
GFR calc non Af Amer: 55 mL/min — ABNORMAL LOW (ref >90)
Potassium: 3.7 mEq/L (ref 3.5–5.1)

## 2012-03-03 LAB — CBC
HCT: 34.2 % — ABNORMAL LOW (ref 36.0–46.0)
MCHC: 34.8 g/dL (ref 30.0–36.0)
MCV: 88.8 fL (ref 78.0–100.0)
Platelets: 120 10*3/uL — ABNORMAL LOW (ref 150–400)
RDW: 13.8 % (ref 11.5–15.5)
WBC: 12.9 10*3/uL — ABNORMAL HIGH (ref 4.0–10.5)

## 2012-03-03 LAB — URINE CULTURE
Colony Count: NO GROWTH
Culture: NO GROWTH

## 2012-03-03 LAB — PROTIME-INR: INR: 2.87 — ABNORMAL HIGH (ref 0.00–1.49)

## 2012-03-03 MED ORDER — WARFARIN SODIUM 2 MG PO TABS
2.0000 mg | ORAL_TABLET | Freq: Once | ORAL | Status: AC
  Administered 2012-03-03: 2 mg via ORAL
  Filled 2012-03-03: qty 1

## 2012-03-03 MED ORDER — MINOCYCLINE HCL 100 MG PO CAPS
100.0000 mg | ORAL_CAPSULE | Freq: Every day | ORAL | Status: DC
  Administered 2012-03-04: 100 mg via ORAL
  Filled 2012-03-03: qty 1

## 2012-03-03 NOTE — Progress Notes (Signed)
ANTICOAGULATION CONSULT NOTE - Follow Up  Pharmacy Consult for Warfarin  Indication: Mechanical Valve Replacement   Allergies  Allergen Reactions  . Heparin Anaphylaxis    Patient Measurements: Height: 5\' 4"  (162.6 cm) Weight: 128 lb 14.4 oz (58.469 kg) IBW/kg (Calculated) : 54.7    Vital Signs: Temp: 98.9 F (37.2 C) (11/10 0600) Temp src: Oral (11/10 0600) BP: 156/69 mmHg (11/10 0600) Pulse Rate: 65  (11/10 0600)  Labs:  Basename 03/03/12 0523 03/02/12 0515 03/01/12 2129 03/01/12 1550  HGB 11.9* 12.1 -- --  HCT 34.2* 35.8* -- 39.2  PLT 120* 126* -- 159  APTT -- -- -- --  LABPROT 28.6* 30.9* 31.9* --  INR 2.87* 3.19* 3.33* --  HEPARINUNFRC -- -- -- --  CREATININE 0.92 0.92 -- 0.83  CKTOTAL -- -- -- --  CKMB -- -- -- --  TROPONINI -- -- -- --    Estimated Creatinine Clearance: 37.9 ml/min (by C-G formula based on Cr of 0.92).   Medical History: Past Medical History  Diagnosis Date  . H/O mitral valve replacement     mechanical heart valve.  Family states indication may have been MR from MVP or  rheumatic heart disease  . Thyroid disease   . Hypertension   . CHF (congestive heart failure)   . S/p nephrectomy     left  . Coronary artery disease   . H/O coronary artery bypass surgery   . 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 Urinary incontinence   . Atrial fibrillation   . DVT (deep venous thrombosis)     possible in 1990s and does not remember being on blood thinner  . Hypothyroidism   . Chronic anemia     heavy periods  . Glaucoma   . Bullous pemphigoid   . Chronic anticoagulation   . Basal cell carcinoma   . Allergic rhinitis     Medications:  Scheduled:     . amLODipine  5 mg Oral Daily  . atenolol  50 mg Oral Daily  . calcium-vitamin D  1 tablet Oral Daily  . clindamycin (CLEOCIN) IV  900 mg Intravenous Q8H  . digoxin  0.25 mg Oral Daily  . docusate sodium  100 mg Oral BID  . folic acid  0.5 mg Oral Daily  . furosemide   20 mg Oral Daily  . latanoprost  1 drop Both Eyes QHS  . levothyroxine  25 mcg Oral QAC breakfast  . minocycline  100 mg Oral BID  . multivitamin with minerals  1 tablet Oral Daily  . niacinamide  500 mg Oral BID WC  . pantoprazole  40 mg Oral Daily  . [COMPLETED] potassium chloride  40 mEq Oral Once  . vancomycin  500 mg Intravenous Q12H  . [COMPLETED] warfarin  0.5 mg Oral ONCE-1800  . Warfarin - Pharmacist Dosing Inpatient   Does not apply q1800  . [DISCONTINUED] minocycline  100 mg Oral Daily  . [DISCONTINUED] potassium chloride SA  20 mEq Oral Daily   Infusions:   PRN: acetaminophen, alum & mag hydroxide-simeth, magnesium hydroxide, ondansetron (ZOFRAN) IV, ondansetron, traMADol  Assessment:  78 yo F on chronic coumadin for mechanical valve replacement.  Higher INR goal of 2.5-3.5 given indication of mechanical valve.   Home dose = 1 mg daily except 0.5 mg on Saturdays.  Patient reports taking last dose today on 11/8.   INR remains therapeutic this am, however continues downward trend -will use larger than  usual home dose tonight.  Platelets slightly low this am - monitor   Drug interaction with minocycline noted (home med)- possible increased anticoag effect - monitor  Goal of Therapy:  INR 2.5-3.5  Monitor platelets by anticoagulation protocol: Yes   Plan:  1) Warfarin 2mg  PO x1 at 18:00 2) Daily PT/INR 3) Monitor CBC and s/sx bleeding   Darrol Angel, PharmD Pager: [phone] 8:20 AM 03/03/2012

## 2012-03-03 NOTE — Progress Notes (Signed)
[facility] PROGRESS NOTE  Amanda Richardson NWG:[id number] DOB: 1923-05-15 DOA: 03/01/2012 PCP: Georgianne Fick, MD  Assessment/Plan: Cellulitis -  drainage from the right leg culture negative so far.  Drainage has improved.  Erythema has improved. - X-ray of medial leg/ankle without definitive subcutaneous gas, but granular opacities. Clinically improving.   - Continue vancomycin with clindamycin  - Tylenol and ultram for pain control   Bullous pemphigoid, worsening  - Continue minocycline and niacinamide  - Wound care consult for medial right leg   CAD, CHF, atrial fibrillation and MVR.  - She has had increased lower extremity edema without other symptoms of CHF. She is in a-fib but rate controlled on heart monitor.  - Continue norvasc and atenolol  - Continue digoxin  - Continue lasix and potassium  - Coumadin per pharmacy  - ECHO nml EF, no wall motion abnormality.  Hypothyroidism  stable. Continue synthroid   Glaucoma  stable. Continue latanoprost  Code Status: Full code Family Communication: Discussed with patient and her family in the room. Disposition Plan: Home inin AM if stable [name], MD  [facility] Pager [phone]  If 7PM-7AM, please contact night-coverage [email or web address] Password [email or web address] 03/02/2012, 10:52 AM   LOS: 1 day   Brief narrative: Patient is a a 71-yo F with history of CAD, CHF, Mitral valve replacement, HTN, atrial fibrillation, hypothyroidism and bullous pemphigoid who presents with right lower extremity pain and redness and swelling.  She was noted to have cellulitis of the extremity and started on antibiotics.  The cellulitis has improved.   Consultants:  None  Procedures: 2D Echo  Antibiotics:  Clinda and  Vanc ?  HPI/Subjective: Patient feels much better today.  She is OK with going home tomorrow on PO antibiotics. Objective: Filed Vitals:   03/01/12 2030 03/01/12 2040 03/01/12 2115 03/02/12 0600  BP:   147/53  123/44  Pulse:   71 66  Temp:  100.1 F (37.8 C) 101.6 F (38.7 C) 100.5 F (38.1 C)  TempSrc:  Oral Oral Oral  Resp: 24  20 18   Height:  5\' 4"  (1.626 m)    Weight:  58.06 kg (128 lb)  58.242 kg (128 lb 6.4 oz)  SpO2:   99% 100%    Intake/Output Summary (Last 24 hours) at 03/02/12 1052 Last data filed at 03/02/12 0032  Gross per 24 hour  Intake      0 ml  Output    600 ml  Net   -600 ml    Exam:   General:  Pt is sitting up in bed, does not seem to be in any acute distress.  Cardiovascular: RRR  Respiratory: clear to ascultation bilaterally,  EXB:MWUX non-tender non distended positive bowel sounds.  Ext: pos for edema 1 plus  Bilaterally r>L with improved erythema of the right lower extremity with some clear drainage Data Reviewed: Basic Metabolic Panel:  Lab 03/02/12 3244 03/01/12 1550  NA 134* 134*  K 3.3* 4.0  CL 100 96  CO2 25 28  GLUCOSE 97 96  BUN 15 15  CREATININE 0.92 0.83  CALCIUM 8.4 9.6  MG -- --  PHOS -- --   Liver Function Tests:  Lab 03/01/12 1550  AST 33  ALT 20  ALKPHOS 62  BILITOT 0.8  PROT 7.2  ALBUMIN 4.0   CBC:  Lab 03/02/12 0515 03/01/12 1550  WBC 17.3* 11.3*  NEUTROABS -- 9.7*  HGB 12.1 13.7  HCT 35.8* 39.2  MCV 89.5 89.3  PLT 126* 159  Recent Results (from the past 240 hour(s))  WOUND CULTURE     Status: Normal (Preliminary result)   Collection Time   28-Mar-2012 10:10 PM      Component Value Range Status Comment   Specimen Description LEG   Final    Special Requests Normal   Final    Gram Stain     Final    Value: NO WBC SEEN     NO SQUAMOUS EPITHELIAL CELLS SEEN     NO ORGANISMS SEEN   Culture PENDING   Incomplete    Report Status PENDING   Incomplete      Studies: Dg Ankle 2 Views Right  03-28-12  *RADIOLOGY REPORT*  Clinical Data: Cellulitis, rash  RIGHT ANKLE - 2 VIEW  Comparison: None.  Findings: Two views of the right ankle submitted.  No acute fracture or subluxation.  There is diffuse soft tissue  swelling. Small granular lucencies are noted within soft tissue.  Early subcutaneous emphysema cannot be excluded.  Clinical correlation is necessary.  However subcutaneous edema may have similar appearance.  IMPRESSION:  No acute fracture or subluxation.  There is diffuse soft tissue swelling.  Small granular lucencies are noted within soft tissue. Early subcutaneous emphysema cannot be excluded.  Clinical correlation is necessary.  However subcutaneous edema may have similar appearance.   Original Report Authenticated By: Natasha Mead, M.D.     Scheduled Meds:   . amLODipine  5 mg Oral Daily  . atenolol  50 mg Oral Daily  . calcium-vitamin D  1 tablet Oral Daily  . [COMPLETED] clindamycin (CLEOCIN) IV  900 mg Intravenous Once  . clindamycin (CLEOCIN) IV  900 mg Intravenous Q8H  . digoxin  0.25 mg Oral Daily  . docusate sodium  100 mg Oral BID  . folic acid  0.5 mg Oral Daily  . furosemide  20 mg Oral Daily  . latanoprost  1 drop Both Eyes QHS  . levothyroxine  25 mcg Oral QAC breakfast  . minocycline  100 mg Oral Daily  . multivitamin with minerals  1 tablet Oral Daily  . niacinamide  500 mg Oral BID WC  . potassium chloride  40 mEq Oral Once  . vancomycin  500 mg Intravenous Q12H  . [COMPLETED] vancomycin  1,000 mg Intravenous To ER  . warfarin  0.5 mg Oral ONCE-1800  . Warfarin - Pharmacist Dosing Inpatient   Does not apply q1800  . [DISCONTINUED] folic acid  400 mcg Oral Daily  . [DISCONTINUED] potassium chloride SA  20 mEq Oral Daily  . [DISCONTINUED] warfarin  0.5-1 mg Oral UD   Continuous Infusions:    Time Spent: 25 min

## 2012-03-04 LAB — CBC
HCT: 34.3 % — ABNORMAL LOW (ref 36.0–46.0)
RDW: 13.8 % (ref 11.5–15.5)
WBC: 8 10*3/uL (ref 4.0–10.5)

## 2012-03-04 LAB — BASIC METABOLIC PANEL
BUN: 18 mg/dL (ref 6–23)
Chloride: 100 mEq/L (ref 96–112)
GFR calc Af Amer: 68 mL/min — ABNORMAL LOW (ref >90)
Potassium: 3.5 mEq/L (ref 3.5–5.1)
Sodium: 132 mEq/L — ABNORMAL LOW (ref 135–145)

## 2012-03-04 LAB — VANCOMYCIN, TROUGH: Vancomycin Tr: 10.2 ug/mL (ref 10.0–20.0)

## 2012-03-04 LAB — WOUND CULTURE

## 2012-03-04 LAB — PROTIME-INR
INR: 2.15 — ABNORMAL HIGH (ref 0.00–1.49)
Prothrombin Time: 23.1 seconds — ABNORMAL HIGH (ref 11.6–15.2)

## 2012-03-04 MED ORDER — OMEPRAZOLE MAGNESIUM 20 MG PO TBEC: 20.0000 mg | DELAYED_RELEASE_TABLET | Freq: Two times a day (BID) | ORAL | Status: DC

## 2012-03-04 MED ORDER — WARFARIN SODIUM 2 MG PO TABS
2.0000 mg | ORAL_TABLET | Freq: Once | ORAL | Status: DC
  Filled 2012-03-04: qty 1

## 2012-03-04 MED ORDER — CLINDAMYCIN HCL 300 MG PO CAPS: 300.0000 mg | ORAL_CAPSULE | Freq: Three times a day (TID) | ORAL | Status: DC

## 2012-03-04 NOTE — Progress Notes (Signed)
ANTIBIOTIC CONSULT NOTE   Pharmacy Consult for Vancomycin Indication: Cellulitis RLE  Allergies  Allergen Reactions  . Heparin Anaphylaxis    Patient Measurements: Height: 5\' 4"  (162.6 cm) Weight: 130 lb 12.8 oz (59.33 kg) IBW/kg (Calculated) : 54.7    Vital Signs: Temp: 98.8 F (37.1 C) (11/11 0600) Temp src: Oral (11/11 0600) BP: 135/68 mmHg (11/11 0600) Pulse Rate: 60  (11/11 0600) Intake/Output from previous day: 11/10 0701 - 11/11 0700 In: 390 [P.O.:120; IV Piggyback:150] Out: 650 [Urine:650] Intake/Output from this shift:    Labs:  Basename 03/04/12 0458 03/03/12 0523 03/02/12 0515  WBC 8.0 12.9* 17.3*  HGB 11.7* 11.9* 12.1  PLT 129* 120* 126*  LABCREA -- -- --  CREATININE 0.87 0.92 0.92   Estimated Creatinine Clearance: 40.1 ml/min (by C-G formula based on Cr of 0.87).  Basename 03/04/12 0633  VANCOTROUGH 10.2  VANCOPEAK --  VANCORANDOM --  GENTTROUGH --  GENTPEAK --  GENTRANDOM --  TOBRATROUGH --  TOBRAPEAK --  TOBRARND --  AMIKACINPEAK --  AMIKACINTROU --  AMIKACIN --     Microbiology: Recent Results (from the past 720 hour(s))  CULTURE, BLOOD (ROUTINE X 2)     Status: Normal (Preliminary result)   Collection Time   03/01/12  5:05 PM      Component Value Range Status Comment   Specimen Description BLOOD   Final    Special Requests BOTTLES DRAWN AEROBIC AND ANAEROBIC    Final    Culture  Setup Time 03/01/2012 23:39   Final    Culture     Final    Value:        BLOOD CULTURE RECEIVED NO GROWTH TO DATE CULTURE WILL BE HELD FOR 5 DAYS BEFORE ISSUING A FINAL NEGATIVE REPORT   Report Status PENDING   Incomplete   CULTURE, BLOOD (ROUTINE X 2)     Status: Normal (Preliminary result)   Collection Time   03/01/12  5:10 PM      Component Value Range Status Comment   Specimen Description BLOOD RIGHT ARM   Final    Special Requests BOTTLES DRAWN AEROBIC AND ANAEROBIC    Final    Culture  Setup Time 03/01/2012 23:39   Final    Culture      Final    Value:        BLOOD CULTURE RECEIVED NO GROWTH TO DATE CULTURE WILL BE HELD FOR 5 DAYS BEFORE ISSUING A FINAL NEGATIVE REPORT   Report Status PENDING   Incomplete   URINE CULTURE     Status: Normal   Collection Time   03/01/12  6:06 PM      Component Value Range Status Comment   Specimen Description URINE, RANDOM   Final    Special Requests NONE   Final    Culture  Setup Time 03/02/2012 01:59   Final    Colony Count NO GROWTH   Final    Culture NO GROWTH   Final    Report Status 03/03/2012 FINAL   Final   WOUND CULTURE     Status: Normal   Collection Time   03/01/12 10:10 PM      Component Value Range Status Comment   Specimen Description LEG   Final    Special Requests Normal   Final    Gram Stain     Final    Value: NO WBC SEEN     NO SQUAMOUS EPITHELIAL CELLS SEEN     NO ORGANISMS  SEEN   Culture NO GROWTH 2 DAYS   Final    Report Status 03/04/2012 FINAL   Final     Medical History: Past Medical History  Diagnosis Date  . H/O mitral valve replacement     mechanical heart valve.  Family states indication may have been MR from MVP or  rheumatic heart disease  . Thyroid disease   . Hypertension   . CHF (congestive heart failure)   . S/p nephrectomy     left  . Coronary artery disease   . H/O coronary artery bypass surgery   . 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 Urinary incontinence   . Atrial fibrillation   . DVT (deep venous thrombosis)     possible in 1990s and does not remember being on blood thinner  . Hypothyroidism   . Chronic anemia     heavy periods  . Glaucoma   . Bullous pemphigoid   . Chronic anticoagulation   . Basal cell carcinoma   . Allergic rhinitis     Medications:  Scheduled:    . amLODipine  5 mg Oral Daily  . atenolol  50 mg Oral Daily  . calcium-vitamin D  1 tablet Oral Daily  . clindamycin (CLEOCIN) IV  900 mg Intravenous Q8H  . digoxin  0.25 mg Oral Daily  . docusate sodium  100 mg Oral BID  . folic acid  0.5  mg Oral Daily  . furosemide  20 mg Oral Daily  . latanoprost  1 drop Both Eyes QHS  . levothyroxine  25 mcg Oral QAC breakfast  . minocycline  100 mg Oral Daily  . multivitamin with minerals  1 tablet Oral Daily  . niacinamide  500 mg Oral BID WC  . pantoprazole  40 mg Oral Daily  . vancomycin  500 mg Intravenous Q12H  . [COMPLETED] warfarin  2 mg Oral ONCE-1800  . warfarin  2 mg Oral ONCE-1800  . Warfarin - Pharmacist Dosing Inpatient   Does not apply q1800  . [DISCONTINUED] minocycline  100 mg Oral BID   Infusions:   PRN: acetaminophen, alum & mag hydroxide-simeth, magnesium hydroxide, ondansetron (ZOFRAN) IV, ondansetron, traMADol Assessment:  78 y/o F on D#4 vancomycin 500 mg IV q12h / clindamycin 900 mg IV q8h [per MD] for cellulitis RLE.  Fever and leukocytosis have resolved.  Vancomycin trough is therapeutic.  Serum creatinine is stable.  Goal of Therapy:   Vancomycin trough 10-15  Eradication of infection  Plan:   Continue present vancomycin dosage.  Follow serum creatinine and clinical course.  Elie Goody, PharmD, BCPS Pager: [phone] 03/04/2012  8:43 AM

## 2012-03-04 NOTE — Discharge Summary (Signed)
Physician Discharge Summary  Amanda Richardson ZOX:[id number] DOB: 01-Feb-1924 DOA: 03/01/2012  PCP: Georgianne Fick, MD  Admit date: 03/01/2012 Discharge date: 03/04/2012  Recommendations for Outpatient Follow-up:   Follow-up Information    Follow up with [facility], MD. In 1 week.   Contact information:   [address] [place] [address] [place] [address] [phone]         Discharge Diagnoses:  Principal Problem: Cellulitis Active Problems:  H/O mitral valve replacement  Hypertension  CHF (congestive heart failure)  Coronary artery disease  Atrial fibrillation  Hypothyroidism  Bullous pemphigoid  Fever  Discharge Condition: Stable Disposition: Home Diet recommendation: Heart Healthy  Filed Weights   03/03/12 0500 03/03/12 0600 03/04/12 0600  Weight: 61.009 kg (134 lb 8 oz) 58.469 kg (128 lb 14.4 oz) 59.33 kg (130 lb 12.8 oz)    History of present illness:  The patient is an 51-yo F with history of CAD, CHF, Mitral valve replacement, HTN, atrial fibrillation, hypothyroidism and bullous pemphigoid who presents with right lower extremity pain and redness and swelling. The patient states that she has had long standing problems with her legs, including chronic lower extremity edema which has been getting progressively worse over the last year, particularly over the last month, and bullae secondary to bullous pemphigoid which have been worse over the last 4-5 months since she decreased her dose of minocycline. She states she was in her normal state of health about one week ago, except for increased lower extremity swelling. 7 days ago, she cut her right leg on a branch of a plant, possibly a rose bush. She had some bleeding from the area that stopped quickly, but since that cut happened, she has had continuous and increasing serous drainage from her right medial leg. The area appeared to be healing some when she woke up this morning because she had decrease serous  drainage, then at 10:30AM, she developed a bee sting like pain on the same area and she noticed it was a little red. Over the course of the next few hours, she developed rapid progression of swelling, redness, and pain of the right leg with increased serous drainage from the ulcerated area. Her symptoms have been associated with fevers, chills, and rigors. She presented to her PCP who sent her to the ER for antibiotics.  In the ER, she was febrile to 102.75F. She had blood cultures, CBC notable for elevated neutrophils 89%, and she was given vancomycin and clindamycin. Per family, her leg already looks less red.    Hospital Course:  Cellulitis  Patient presented with cellulitis of her right lower extremity. Drainage from the right leg culture was negative. Drainage and erythema improved with antibiotic therapy. X-ray of medial leg/ankle without definitive subcutaneous gas, but granular opacities.  Patient was treated with IV vancomycin with clindamycin.  Redness and swelling improved in her lower extremity. Her fever and elevated white count both resolved. Patient was discharged on clindamycin for 7-8 days. She will follow up with her primary care physician. She was continued on the minocycline for her bullous pemphigoid.    Bullous pemphigoid, worsening  Continue minocycline and niacinamide   CAD, CHF, atrial fibrillation and MVR.  - She has had increased lower extremity edema without other symptoms of CHF. She is in a-fib but rate controlled on heart monitor.  she was continued on her home medications of atenolol digoxin Norvasc Lasix and potassium. Coumadin was monitored per pharmacy.  She had a 2-D echo done that show nml EF,  no wall motion abnormality.   Hypothyroidism   she was continued on her Synthroid   Glaucoma  She was  Continued on latanoprost  Reflux Patient complained of some reflux symptoms. She stated that she feels like there is stuff coming from her stomach up into her throat.  She stated that the Maalox helps. Discharged her on  Prilosec OTC twice daily while she is on antibiotic. If symptoms persist she will discuss with her primary care doctor.    Discharge Instructions  Discharge Orders    Future Orders Please Complete By Expires   Diet - low sodium heart healthy      Increase activity slowly          Medication List     As of 03/04/2012 11:20 AM    TAKE these medications         amLODipine 5 MG tablet   Commonly known as: NORVASC   Take 5 mg by mouth daily.      atenolol 50 MG tablet   Commonly known as: TENORMIN   Take 50 mg by mouth daily.      calcium-vitamin D 500-200 MG-UNIT per tablet   Commonly known as: OSCAL WITH D   Take 1 tablet by mouth daily.      clindamycin 300 MG capsule   Commonly known as: CLEOCIN   Take 1 capsule (300 mg total) by mouth 3 (three) times daily.      digoxin 0.25 MG tablet   Commonly known as: LANOXIN   Take 0.25 mg by mouth daily.      folic acid 400 MCG tablet   Commonly known as: FOLVITE   Take 400 mcg by mouth daily.      furosemide 20 MG tablet   Commonly known as: LASIX   Take 20 mg by mouth daily.      latanoprost 0.005 % ophthalmic solution   Commonly known as: XALATAN   Place 1 drop into both eyes at bedtime.      levothyroxine 25 MCG tablet   Commonly known as: SYNTHROID, LEVOTHROID   Take 25 mcg by mouth daily.      minocycline 100 MG capsule   Commonly known as: MINOCIN,DYNACIN   Take 100 mg by mouth daily.      multivitamin with minerals Tabs   Take 1 tablet by mouth daily.      niacinamide 500 MG tablet   Take 500 mg by mouth 2 (two) times daily with a meal.      omeprazole 20 MG tablet   Commonly known as: PRILOSEC OTC   Take 1 tablet (20 mg total) by mouth 2 (two) times daily.      potassium chloride SA 20 MEQ tablet   Commonly known as: K-DUR,KLOR-CON   Take 20 mEq by mouth daily.      Vitamin D-3 1000 UNITS Caps   Take 1 capsule by mouth 2 (two) times daily.        warfarin 1 MG tablet   Commonly known as: COUMADIN   Take 0.5-1 mg by mouth as directed. Take 1 mg everyday except on Saturday. Take .5 mg on Saturday           Follow-up Information    Follow up with [facility], MD. In 1 week.   Contact information:   [address] [place] [address] [phone]           The results of significant diagnostics from this hospitalization (including imaging,  microbiology, ancillary and laboratory) are listed below for reference.    Significant Diagnostic Studies: Dg Ankle 2 Views Right  Mar 05, 2012  *RADIOLOGY REPORT*  Clinical Data: Cellulitis, rash  RIGHT ANKLE - 2 VIEW  Comparison: None.  Findings: Two views of the right ankle submitted.  No acute fracture or subluxation.  There is diffuse soft tissue swelling. Small granular lucencies are noted within soft tissue.  Early subcutaneous emphysema cannot be excluded.  Clinical correlation is necessary.  However subcutaneous edema may have similar appearance.  IMPRESSION:  No acute fracture or subluxation.  There is diffuse soft tissue swelling.  Small granular lucencies are noted within soft tissue. Early subcutaneous emphysema cannot be excluded.  Clinical correlation is necessary.  However subcutaneous edema may have similar appearance.   Original Report Authenticated By: Natasha Mead, M.D.     Microbiology: Recent Results (from the past 240 hour(s))  CULTURE, BLOOD (ROUTINE X 2)     Status: Normal (Preliminary result)   Collection Time   March 05, 2012  5:05 PM      Component Value Range Status Comment   Specimen Description BLOOD   Final    Special Requests BOTTLES DRAWN AEROBIC AND ANAEROBIC    Final    Culture  Setup Time March 05, 2012 23:39   Final    Culture     Final    Value:        BLOOD CULTURE RECEIVED NO GROWTH TO DATE CULTURE WILL BE HELD FOR 5 DAYS BEFORE ISSUING A FINAL NEGATIVE REPORT   Report Status PENDING   Incomplete   CULTURE, BLOOD (ROUTINE X 2)      Status: Normal (Preliminary result)   Collection Time   March 05, 2012  5:10 PM      Component Value Range Status Comment   Specimen Description BLOOD RIGHT ARM   Final    Special Requests BOTTLES DRAWN AEROBIC AND ANAEROBIC    Final    Culture  Setup Time Mar 05, 2012 23:39   Final    Culture     Final    Value:        BLOOD CULTURE RECEIVED NO GROWTH TO DATE CULTURE WILL BE HELD FOR 5 DAYS BEFORE ISSUING A FINAL NEGATIVE REPORT   Report Status PENDING   Incomplete   URINE CULTURE     Status: Normal   Collection Time   2012/03/05  6:06 PM      Component Value Range Status Comment   Specimen Description URINE, RANDOM   Final    Special Requests NONE   Final    Culture  Setup Time 03/02/2012 01:59   Final    Colony Count NO GROWTH   Final    Culture NO GROWTH   Final    Report Status 03/03/2012 FINAL   Final   WOUND CULTURE     Status: Normal   Collection Time   2012/03/05 10:10 PM      Component Value Range Status Comment   Specimen Description LEG   Final    Special Requests Normal   Final    Gram Stain     Final    Value: NO WBC SEEN     NO SQUAMOUS EPITHELIAL CELLS SEEN     NO ORGANISMS SEEN   Culture NO GROWTH 2 DAYS   Final    Report Status 03/04/2012 FINAL   Final      Labs: Basic Metabolic Panel:  Lab 03/04/12 1610 03/03/12 0523 03/02/12 0515 March 05, 2012 1550  NA  132* 133* 134* 134*  K 3.5 3.7 3.3* 4.0  CL 100 101 100 96  CO2 24 24 25 28   GLUCOSE 98 98 97 96  BUN 18 14 15 15   CREATININE 0.87 0.92 0.92 0.83  CALCIUM 8.3* 8.5 8.4 9.6  MG -- -- -- --  PHOS -- -- -- --   Liver Function Tests:  Lab 03/01/12 1550  AST 33  ALT 20  ALKPHOS 62  BILITOT 0.8  PROT 7.2  ALBUMIN 4.0   CBC:  Lab 03/04/12 0458 03/03/12 0523 03/02/12 0515 03/01/12 1550  WBC 8.0 12.9* 17.3* 11.3*  NEUTROABS -- -- -- 9.7*  HGB 11.7* 11.9* 12.1 13.7  HCT 34.3* 34.2* 35.8* 39.2  MCV 88.9 88.8 89.5 89.3  PLT 129* 120* 126* 159     Time coordinating discharge: 60 min Signed:  [name], MD [facility] 03/04/2012, 11:20 AM

## 2012-03-04 NOTE — Consult Note (Signed)
WOC consult Note Reason for Consult:weeping area on right medial LE.  Patient with history of bullous pemphigoid. Wound type:traumatic.  Patient had trimmed bushes in yard one week ago and stepped on a twig, which then "popped up to poke her in the leg". Pressure Ulcer POA: No Measurement:.2cm round opening leaking yellow to serous fluid.  Draining is tapering off; patient states that her legs are up here in the hospital when they are primarily down (during the day) at home. Patient lives alone. Wound bed: not able to visualize,a but this small opening is leaking serous fluid at this time. Drainage (amount, consistency, odor) thin serous fluid, stains yellow on plastic, disposable underpad on bed. Periwound:intact, with resolving edema and fragile skin with staining.  Left LE has evidence of previously healed traumatic injuries (scabbed areas).  Patient states that these are from tape. Dressing procedure/placement/frequency:Patient will benefit from daily wound care with saline cleanse, placement of a non-adherent, non-irritating moisture retentive dressing.  I will order petrolatum gauze to be topped with a dry dressing and secured with a Kerlix wrap.  No adhesives are to be used on her skin in securing the dressings. May require HHRN to change and observe this area post discharge.  Patient may also require assessment of living situation to determine if a higher level of care is needed.  If you agree, please order HHRN. I will not follow.  Please re-consult if needed. Thanks, Ladona Mow, MSN, RN, [facility], CWOCN [phone])

## 2012-03-04 NOTE — Progress Notes (Signed)
Patient evaluated for long-term disease management services with [facility] Care Management Program as a benefit of her [employer]. Met with patient and daughter at bedside to explain [facility] Care Management services. Explained that she will receive a post discharge transition of care call and be evaluated for monthly home visits for assessments and for education. Discharge plan is to return home alone with home health services. Daughter at bedside is supportive as well. Left [facility] Care Management packet and contact information at bedside. Patient appreciative of visit.  Raiford Noble, RN,BSN, MSN-Ed, [facility] Liaison [phone]

## 2012-03-04 NOTE — Progress Notes (Signed)
ANTICOAGULATION CONSULT NOTE   Pharmacy Consult for Warfarin  Indication: Mechanical prosthetic mitral valve  Allergies  Allergen Reactions  . Heparin Anaphylaxis    Patient Measurements: Height: 5\' 4"  (162.6 cm) Weight: 130 lb 12.8 oz (59.33 kg) IBW/kg (Calculated) : 54.7    Vital Signs: Temp: 98.8 F (37.1 C) (11/11 0600) Temp src: Oral (11/11 0600) BP: 135/68 mmHg (11/11 0600) Pulse Rate: 60  (11/11 0600)  Labs:  Basename 03/04/12 0458 03/03/12 0523 03/02/12 0515  HGB 11.7* 11.9* --  HCT 34.3* 34.2* 35.8*  PLT 129* 120* 126*  APTT -- -- --  LABPROT 23.1* 28.6* 30.9*  INR 2.15* 2.87* 3.19*  HEPARINUNFRC -- -- --  CREATININE 0.87 0.92 0.92  CKTOTAL -- -- --  CKMB -- -- --  TROPONINI -- -- --    Estimated Creatinine Clearance: 40.1 ml/min (by C-G formula based on Cr of 0.87).   Medical History: Past Medical History  Diagnosis Date  . H/O mitral valve replacement     mechanical heart valve.  Family states indication may have been MR from MVP or  rheumatic heart disease  . Thyroid disease   . Hypertension   . CHF (congestive heart failure)   . S/p nephrectomy     left  . Coronary artery disease   . H/O coronary artery bypass surgery   . 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 Urinary incontinence   . Atrial fibrillation   . DVT (deep venous thrombosis)     possible in 1990s and does not remember being on blood thinner  . Hypothyroidism   . Chronic anemia     heavy periods  . Glaucoma   . Bullous pemphigoid   . Chronic anticoagulation   . Basal cell carcinoma   . Allergic rhinitis     Medications:  Scheduled:     . amLODipine  5 mg Oral Daily  . atenolol  50 mg Oral Daily  . calcium-vitamin D  1 tablet Oral Daily  . clindamycin (CLEOCIN) IV  900 mg Intravenous Q8H  . digoxin  0.25 mg Oral Daily  . docusate sodium  100 mg Oral BID  . folic acid  0.5 mg Oral Daily  . furosemide  20 mg Oral Daily  . latanoprost  1 drop Both Eyes  QHS  . levothyroxine  25 mcg Oral QAC breakfast  . minocycline  100 mg Oral Daily  . multivitamin with minerals  1 tablet Oral Daily  . niacinamide  500 mg Oral BID WC  . pantoprazole  40 mg Oral Daily  . vancomycin  500 mg Intravenous Q12H  . [COMPLETED] warfarin  2 mg Oral ONCE-1800  . Warfarin - Pharmacist Dosing Inpatient   Does not apply q1800  . [DISCONTINUED] minocycline  100 mg Oral BID   Inpatient warfarin doses administered 11/9 - 11/10:  0.5, 2 mg  Infusions:   PRN: acetaminophen, alum & mag hydroxide-simeth, magnesium hydroxide, ondansetron (ZOFRAN) IV, ondansetron, traMADol  Assessment:  78 yo F on chronic warfarin for mechanical prosthetic mitral valve. INR goal 2.5 - 3.5.  Home dose warfarin dosage reported as 1 mg daily except 0.5 mg on Saturdays.   Pltc slightly low but stable.  Drug interaction with minocycline noted (home med)- possible increased anticoag effect - monitor  On regular diet.  INR falling, now below goal range.  Goal of Therapy:  INR 2.5-3.5     Plan:  1.  Repeat warfarin booster dose  of 2 mg x 1 today. 2.  Follow PT/INR daily.  Elie Goody, PharmD, BCPS Pager: [phone] 03/04/2012  8:35 AM

## 2012-03-04 NOTE — Progress Notes (Signed)
Discharge instructions given to pt/dtr; verbalized understanding. Left the unit in stable condition.

## 2012-03-07 LAB — CULTURE, BLOOD (ROUTINE X 2)
Culture: NO GROWTH
Culture: NO GROWTH

## 2012-04-09 ENCOUNTER — Other Ambulatory Visit (HOSPITAL_COMMUNITY): Payer: Self-pay | Admitting: Cardiovascular Disease

## 2012-04-09 DIAGNOSIS — I6529 Occlusion and stenosis of unspecified carotid artery: Secondary | ICD-10-CM

## 2012-09-02 ENCOUNTER — Inpatient Hospital Stay (HOSPITAL_COMMUNITY): Admission: RE | Admit: 2012-09-02 | Payer: Medicare Other | Source: Ambulatory Visit

## 2012-09-29 ENCOUNTER — Other Ambulatory Visit: Payer: Self-pay | Admitting: Cardiovascular Disease

## 2012-10-18 ENCOUNTER — Other Ambulatory Visit: Payer: Self-pay | Admitting: Dermatology

## 2012-11-13 ENCOUNTER — Telehealth: Payer: Self-pay | Admitting: *Deleted

## 2012-11-13 ENCOUNTER — Encounter: Payer: Self-pay | Admitting: Cardiology

## 2012-11-13 ENCOUNTER — Ambulatory Visit (INDEPENDENT_AMBULATORY_CARE_PROVIDER_SITE_OTHER): Payer: Medicare Other | Admitting: Cardiology

## 2012-11-13 VITALS — BP 150/70 | HR 60 | Ht 64.0 in | Wt 130.6 lb

## 2012-11-13 DIAGNOSIS — R609 Edema, unspecified: Secondary | ICD-10-CM

## 2012-11-13 DIAGNOSIS — Z954 Presence of other heart-valve replacement: Secondary | ICD-10-CM

## 2012-11-13 DIAGNOSIS — L120 Bullous pemphigoid: Secondary | ICD-10-CM

## 2012-11-13 DIAGNOSIS — Z7901 Long term (current) use of anticoagulants: Secondary | ICD-10-CM

## 2012-11-13 DIAGNOSIS — Z952 Presence of prosthetic heart valve: Secondary | ICD-10-CM

## 2012-11-13 DIAGNOSIS — I482 Chronic atrial fibrillation, unspecified: Secondary | ICD-10-CM

## 2012-11-13 DIAGNOSIS — I2510 Atherosclerotic heart disease of native coronary artery without angina pectoris: Secondary | ICD-10-CM

## 2012-11-13 DIAGNOSIS — L129 Pemphigoid, unspecified: Secondary | ICD-10-CM

## 2012-11-13 DIAGNOSIS — Z951 Presence of aortocoronary bypass graft: Secondary | ICD-10-CM

## 2012-11-13 DIAGNOSIS — I4891 Unspecified atrial fibrillation: Secondary | ICD-10-CM

## 2012-11-13 DIAGNOSIS — R600 Localized edema: Secondary | ICD-10-CM

## 2012-11-13 DIAGNOSIS — Z79899 Other long term (current) drug therapy: Secondary | ICD-10-CM

## 2012-11-13 NOTE — Patient Instructions (Addendum)
Your physician recommends that you return for lab work in: the next month BMP  Your physician wants you to follow-up in: 6 months. You will receive a reminder letter in the mail two months in advance. If you don't receive a letter, please call our office to schedule the follow-up appointment.  Tips for taking potassium pills - melt pill in a little bit of warm water, then mix with a liquid or applesauce to hide taste - crush pill and mix with liquid or applesauce

## 2012-11-13 NOTE — Progress Notes (Signed)
11/13/2012 Amanda Richardson   December 25, 1923  [id number]  Primary Physicia Georgianne Fick, MD Primary Cardiologist: Dr Allyson Sabal  HPI:  79 y/o female followed by Dr Allyson Sabal here fro 6 month check up. Her records here and in Epic are incomplete. She had a mechanical MVR by Dr Laneta Simmers in 2001. Some notes indicate she may have had a bypass as well. There is no history of a Myoview. Echo in 11/13 showed preserved LVF. She has CAF and cut her Lanoxin back to 0.125 mg daily and feels better on this dose. Her HR today is 60. She denies any anginal symptoms. She has chronic LE edema. Dr Rennis Golden evaluated her in Oct 2013 but she was not felt to be a candidate for venous ablation. She has bullous pemphigoid and has trouble swallowing her K dur.    Current Outpatient Prescriptions  Medication Sig Dispense Refill  . atenolol (TENORMIN) 50 MG tablet Take 50 mg by mouth daily.      . calcium-vitamin D (OSCAL WITH D) 500-200 MG-UNIT per tablet Take 1 tablet by mouth daily.      . Cholecalciferol (VITAMIN D-3) 1000 UNITS CAPS Take 1 capsule by mouth 2 (two) times daily.      . clobetasol ointment (TEMOVATE) 0.05 % Apply 1 application topically as needed.      . digoxin (LANOXIN) 0.25 MG tablet Take 0.25 mg by mouth daily.      . folic acid (FOLVITE) 400 MCG tablet Take 400 mcg by mouth daily.      . furosemide (LASIX) 20 MG tablet Take 20 mg by mouth daily.       Marland Kitchen latanoprost (XALATAN) 0.005 % ophthalmic solution Place 1 drop into both eyes at bedtime.      Marland Kitchen levothyroxine (SYNTHROID, LEVOTHROID) 25 MCG tablet Take 25 mcg by mouth daily.      Marland Kitchen lisinopril (PRINIVIL,ZESTRIL) 5 MG tablet TAKE 1 TABLET EVERY DAY  30 tablet  5  . Multiple Vitamin (MULTIVITAMIN WITH MINERALS) TABS Take 1 tablet by mouth daily.      . niacinamide 500 MG tablet Take 500 mg by mouth 2 (two) times daily with a meal.       . potassium chloride SA (K-DUR,KLOR-CON) 20 MEQ tablet Take 20 mEq by mouth daily.      . tacrolimus (PROGRAF) 1 MG  capsule Take 1 capsule by mouth daily.      Marland Kitchen warfarin (COUMADIN) 1 MG tablet Take 0.5-1 mg by mouth as directed. Take 1 mg everyday except on Saturday. Take .5 mg on Saturday       No current facility-administered medications for this visit.    Allergies  Allergen Reactions  . Heparin Anaphylaxis    History   Social History  . Marital Status: Widowed    Spouse Name: N/A    Number of Children: N/A  . Years of Education: N/A   Occupational History  . Not on file.   Social History Main Topics  . Smoking status: Never Smoker   . Smokeless tobacco: Not on file  . Alcohol Use: No  . Drug Use: No  . Sexually Active:    Other Topics Concern  . Not on file   Social History Narrative   Two daughters, [name] and [name]  Lives alone in a house with some steps near the mailbox, but otherwise single story.  Does not use a cane or walker or other assist device.  Active and gardens a lot.  Completes ADLS without assistance.  Review of Systems: General: negative for chills, fever, night sweats or weight changes.  Cardiovascular: negative for chest pain, dyspnea on exertion, edema, orthopnea, palpitations, paroxysmal nocturnal dyspnea or shortness of breath Dermatological: negative for rash Respiratory: negative for cough or wheezing Urologic: negative for hematuria Abdominal: negative for nausea, vomiting, diarrhea, bright red blood per rectum, melena, or hematemesis Neurologic: negative for visual changes, syncope, or dizziness All other systems reviewed and are otherwise negative except as noted above.    Blood pressure 150/70, pulse 60, height 5\' 4"  (1.626 m), weight 130 lb 9.6 oz (59.24 kg).  General appearance: alert, cooperative and no distress Lungs: clear to auscultation bilaterally and kyphoscoliosis Heart: irregularly irregular rhythm and positive valve sounds over the Mitral area Extremities: chronic venous changes. !+ edema on Lt trace on Rt  EKG  EKG: unchanged  from previous tracings. AF with CVR  ASSESSMENT AND PLAN:   History of mechanical MVR 2001  Chronic atrial fibrillation Pt says she cut her Lanoxin back to 0.125 daily and feels she is doing well on this dose.  Bullous pemphigoid Trouble swallowing pills (K Dur)  Chronic anticoagulation Amanda Richardson (Pharmacist)  follows  Edema extremities- chronic. Seen by Dr Rennis Golden 10/13 Not felt to be a good candidate for ablation     PLAN  I will try and get records from Dr Sharee Pimple office. She will have lab drawn at her primary care providers office in a month and I asked her to send [place] copies. Her daughter was present in the interview and exam.   Garnette Gunner 11/13/2012 12:40 PM

## 2012-11-13 NOTE — Telephone Encounter (Signed)
Called [facility] Wayland Salinas, MD) requesting operative note on patient from 2001. Informed if note is located, will be faxed to [facility].

## 2012-11-13 NOTE — Assessment & Plan Note (Signed)
Pt says she cut her Lanoxin back to 0.125 daily and feels she is doing well on this dose.

## 2012-11-13 NOTE — Assessment & Plan Note (Signed)
Trouble swallowing pills (K Dur)

## 2012-11-13 NOTE — Assessment & Plan Note (Signed)
No angina 

## 2012-11-13 NOTE — Assessment & Plan Note (Signed)
Not felt to be a good candidate for ablation

## 2012-11-13 NOTE — Assessment & Plan Note (Signed)
Compensated 

## 2012-11-13 NOTE — Assessment & Plan Note (Addendum)
Virgina Evener (Pharmacist)  follows

## 2012-11-13 NOTE — Assessment & Plan Note (Signed)
Check BMP .  

## 2012-11-16 ENCOUNTER — Encounter: Payer: Self-pay | Admitting: Cardiovascular Disease

## 2013-04-22 ENCOUNTER — Encounter: Payer: Self-pay | Admitting: Cardiovascular Disease

## 2013-04-22 ENCOUNTER — Ambulatory Visit (INDEPENDENT_AMBULATORY_CARE_PROVIDER_SITE_OTHER): Payer: Medicare Other | Admitting: Cardiovascular Disease

## 2013-04-22 VITALS — BP 130/62 | HR 58 | Ht 64.0 in | Wt 133.0 lb

## 2013-04-22 DIAGNOSIS — Z954 Presence of other heart-valve replacement: Secondary | ICD-10-CM

## 2013-04-22 DIAGNOSIS — I4891 Unspecified atrial fibrillation: Secondary | ICD-10-CM

## 2013-04-22 DIAGNOSIS — I482 Chronic atrial fibrillation, unspecified: Secondary | ICD-10-CM

## 2013-04-22 DIAGNOSIS — Z952 Presence of prosthetic heart valve: Secondary | ICD-10-CM

## 2013-04-22 DIAGNOSIS — I10 Essential (primary) hypertension: Secondary | ICD-10-CM

## 2013-04-22 NOTE — Assessment & Plan Note (Signed)
Well-controlled on current medications 

## 2013-04-22 NOTE — Assessment & Plan Note (Signed)
History of [employer] mitral valve replacement in 2001. We've been following her 2-D echoes on annual basis which was last done a year ago revealing normal LV function with a well-functioning mitral prosthesis. She has crisp valve sounds on exam. We'll repeat a 2-D echocardiogram

## 2013-04-22 NOTE — Patient Instructions (Signed)
Your physician wants you to follow-up in: 6 months with Corine Shelter PA and 1 year with Dr Allyson Sabal. You will receive a reminder letter in the mail two months in advance. If you don't receive a letter, please call our office to schedule the follow-up appointment.  Dr Allyson Sabal has ordered an echocardiogram.

## 2013-04-22 NOTE — Assessment & Plan Note (Signed)
Rate controlled on Coumadin anticoagulation 

## 2013-04-22 NOTE — Progress Notes (Signed)
04/22/2013 Amanda Richardson   [birth year]  [id number]  Primary Physician Georgianne Fick, MD Primary Cardiologist: Runell Gess MD Amanda Richardson   HPI: The patient is an 79 year old thin and frail-appearing widowed Caucasian female, mother of 2 daughter, whom I last saw 3 months ago. She was referred to be established in our practice back in August. Her history is remarkable for chronic AFib; on digoxin, beta-blocker, and Coumadin anticoagulation. She had mitral valve replacement back in 2001. She also has treated hypertension. She has a family history of heart disease with a brother who had an MI in his 29s, but she has never had a heart attack or stroke. She had an echo performed in our office March 02, 2012, revealing normal LV function with mildly dilated left ventricle and a well-functioning mechanical mitral prosthesis. She did have venous stasis changes and chronic venous insufficiency by Doppler. I sent her to Dr. [place] Hilty to evaluate her for venous ablation, who thought she was not a good candidate for this based on her anatomy. I did discontinue her amlodipine, began her on a low-dose ACE inhibitor and low-pressure compression stockings, which provided symptomatic improvement. She saw Corine Shelter PAC back in the office 6 months ago and was stable at that time. She's been asymptomatic since that time.    Current Outpatient Prescriptions  Medication Sig Dispense Refill  . atenolol (TENORMIN) 50 MG tablet Take 50 mg by mouth daily.      . calcium-vitamin D (OSCAL WITH D) 500-200 MG-UNIT per tablet Take 1 tablet by mouth daily.      . Cholecalciferol (VITAMIN D-3) 1000 UNITS CAPS Take 1 capsule by mouth 2 (two) times daily.      . clobetasol ointment (TEMOVATE) 0.05 % Apply 1 application topically as needed.      . digoxin (LANOXIN) 0.125 MG tablet Take 0.125 mg by mouth daily.      . furosemide (LASIX) 20 MG tablet Take 20 mg by mouth daily.       Marland Kitchen latanoprost  (XALATAN) 0.005 % ophthalmic solution Place 1 drop into both eyes at bedtime.      Marland Kitchen levothyroxine (SYNTHROID, LEVOTHROID) 25 MCG tablet Take 25 mcg by mouth daily.      Marland Kitchen lisinopril (PRINIVIL,ZESTRIL) 5 MG tablet TAKE 1 TABLET EVERY DAY  30 tablet  5  . Multiple Vitamin (MULTIVITAMIN WITH MINERALS) TABS Take 1 tablet by mouth daily.      . potassium chloride SA (K-DUR,KLOR-CON) 20 MEQ tablet Take 20 mEq by mouth daily.      Marland Kitchen warfarin (COUMADIN) 1 MG tablet Take 0.5-1 mg by mouth as directed. Take 1 mg everyday except on Saturday. Take .5 mg on Saturday      . tacrolimus (PROGRAF) 1 MG capsule Take 1 capsule by mouth daily.       No current facility-administered medications for this visit.    Allergies  Allergen Reactions  . Heparin Anaphylaxis    History   Social History  . Marital Status: Widowed    Spouse Name: N/A    Number of Children: N/A  . Years of Education: N/A   Occupational History  . Not on file.   Social History Main Topics  . Smoking status: Never Smoker   . Smokeless tobacco: Not on file  . Alcohol Use: No  . Drug Use: No  . Sexual Activity:    Other Topics Concern  . Not on file   Social History Narrative  Two daughters, [name] and [name]  Lives alone in a house with some steps near the mailbox, but otherwise single story.  Does not use a cane or walker or other assist device.  Active and gardens a lot.  Completes ADLS without assistance.       Review of Systems: General: negative for chills, fever, night sweats or weight changes.  Cardiovascular: negative for chest pain, dyspnea on exertion, edema, orthopnea, palpitations, paroxysmal nocturnal dyspnea or shortness of breath Dermatological: negative for rash Respiratory: negative for cough or wheezing Urologic: negative for hematuria Abdominal: negative for nausea, vomiting, diarrhea, bright red blood per rectum, melena, or hematemesis Neurologic: negative for visual changes, syncope, or  dizziness All other systems reviewed and are otherwise negative except as noted above.    Blood pressure 130/62, pulse 58, height 5\' 4"  (1.626 m), weight 133 lb (60.328 kg).  General appearance: alert and no distress Neck: no adenopathy, no carotid bruit, no JVD, supple, symmetrical, trachea midline and thyroid not enlarged, symmetric, no tenderness/mass/nodules Lungs: clear to auscultation bilaterally Heart: irregularly irregular rhythm and crisp prosthetic valve sounds Extremities: extremities normal, atraumatic, no cyanosis or edema  EKG aatrial fibrillation with a ventricular response of 58 and evidence of left ventricular hypertrophy with repolarization changes  ASSESSMENT AND PLAN:   Chronic atrial fibrillation Rate controlled on Coumadin anticoagulation  Hypertension Well controlled on current medications  H/O mechanical mitral valve replacement 2001 History of St. Jude mitral valve replacement in 2001. We've been following her 2-D echoes on annual basis which was last done a year ago revealing normal LV function with a well-functioning mitral prosthesis. She has crisp valve sounds on exam. We'll repeat a 2-D echocardiogram      Runell Gess MD [facility], [facility] 04/22/2013 4:26 PM

## 2013-04-23 ENCOUNTER — Encounter: Payer: Self-pay | Admitting: Cardiovascular Disease

## 2013-05-07 ENCOUNTER — Ambulatory Visit (HOSPITAL_COMMUNITY): Payer: Medicare Other

## 2013-09-30 ENCOUNTER — Encounter: Payer: Self-pay | Admitting: *Deleted

## 2013-10-02 ENCOUNTER — Ambulatory Visit (INDEPENDENT_AMBULATORY_CARE_PROVIDER_SITE_OTHER): Payer: Medicare Other | Admitting: Cardiology

## 2013-10-02 ENCOUNTER — Encounter: Payer: Self-pay | Admitting: Cardiology

## 2013-10-02 VITALS — BP 168/82 | HR 53 | Ht 64.0 in | Wt 133.0 lb

## 2013-10-02 DIAGNOSIS — I059 Rheumatic mitral valve disease, unspecified: Secondary | ICD-10-CM

## 2013-10-02 DIAGNOSIS — Z7901 Long term (current) use of anticoagulants: Secondary | ICD-10-CM

## 2013-10-02 DIAGNOSIS — Z954 Presence of other heart-valve replacement: Secondary | ICD-10-CM

## 2013-10-02 DIAGNOSIS — I4891 Unspecified atrial fibrillation: Secondary | ICD-10-CM

## 2013-10-02 DIAGNOSIS — I482 Chronic atrial fibrillation, unspecified: Secondary | ICD-10-CM

## 2013-10-02 DIAGNOSIS — I341 Nonrheumatic mitral (valve) prolapse: Secondary | ICD-10-CM

## 2013-10-02 DIAGNOSIS — Z952 Presence of prosthetic heart valve: Secondary | ICD-10-CM

## 2013-10-02 NOTE — Patient Instructions (Signed)
OK to stop Digoxin if your heart rate goes below 50 Your physician has requested that you have an echocardiogram. Echocardiography is a painless test that uses sound waves to create images of your heart. It provides your doctor with information about the size and shape of your heart and how well your heart's chambers and valves are working. This procedure takes approximately one hour. There are no restrictions for this procedure. Your physician recommends that you schedule a follow-up appointment in:6 months with Dr [name].

## 2013-10-02 NOTE — Assessment & Plan Note (Signed)
Tolerating this well

## 2013-10-02 NOTE — Assessment & Plan Note (Signed)
St Jude MVR 2001

## 2013-10-02 NOTE — Assessment & Plan Note (Signed)
Followed by her primary care provider

## 2013-10-02 NOTE — Progress Notes (Signed)
10/02/2013 Amanda Richardson   [birth year]  [id number]  Primary Physicia Merrilee Seashore, MD Primary Cardiologist: Dr [name]  HPI:  The patient is an 80 year old thin and frail-appearing widowed Caucasian female, mother of 2 daughter, who is followed by Dr [name]. She was referred to be established in our practice back in August 2013. Her history is remarkable mechanical MVR 2001 by Dr Cyndia Bent, chronic AFib, chronic venous LE edema, and HTN. She is on digoxin, beta-blocker, and Coumadin anticoagulation. She had an echo performed in our office March 02, 2012, revealing normal LV function with mildly dilated left ventricle and a well-functioning mechanical mitral prosthesis. She did have venous stasis changes and chronic venous insufficiency by Doppler. Dr.Hilty has evaluated her for venous ablation, who felt  she was not a good candidate for this based on her anatomy. Her amlodipine was discontinued ,  And she  Was started on a low-dose ACE inhibitor and low-pressure compression stockings, which provided symptomatic improvement. She is in the office today for 6 month follow up. She denies any unusual SOB. She has not had dizziness or syncope. She denies any tachycardia. Her chronic edema is unchanged.     Current Outpatient Prescriptions  Medication Sig Dispense Refill  . atenolol (TENORMIN) 50 MG tablet Take 50 mg by mouth daily.      . calcium-vitamin D (OSCAL WITH D) 500-200 MG-UNIT per tablet Take 1 tablet by mouth daily.      . Cholecalciferol (VITAMIN D-3) 1000 UNITS CAPS Take 1 capsule by mouth 2 (two) times daily.      . clobetasol ointment (TEMOVATE) 2.99 % Apply 1 application topically as needed.      . digoxin (LANOXIN) 0.125 MG tablet Take 0.125 mg by mouth daily.      . furosemide (LASIX) 20 MG tablet Take 20 mg by mouth daily.       Marland Kitchen latanoprost (XALATAN) 0.005 % ophthalmic solution Place 1 drop into both eyes at bedtime.      Marland Kitchen levothyroxine (SYNTHROID, LEVOTHROID) 25 MCG  tablet Take 25 mcg by mouth daily.      Marland Kitchen lisinopril (PRINIVIL,ZESTRIL) 5 MG tablet TAKE 1 TABLET EVERY DAY  30 tablet  5  . Multiple Vitamin (MULTIVITAMIN WITH MINERALS) TABS Take 1 tablet by mouth daily.      . potassium chloride SA (K-DUR,KLOR-CON) 20 MEQ tablet Take 20 mEq by mouth daily.      . tacrolimus (PROGRAF) 1 MG capsule Take 1 capsule by mouth daily.      Marland Kitchen warfarin (COUMADIN) 1 MG tablet Take 0.5-1 mg by mouth as directed. Take 1 mg everyday except on Saturday. Take .5 mg on Saturday       No current facility-administered medications for this visit.    Allergies  Allergen Reactions  . Heparin Anaphylaxis    History   Social History  . Marital Status: Widowed    Spouse Name: N/A    Number of Children: 2  . Years of Education: 12   Occupational History  . Not on file.   Social History Main Topics  . Smoking status: Never Smoker   . Smokeless tobacco: Not on file  . Alcohol Use: No  . Drug Use: No  . Sexual Activity:    Other Topics Concern  . Not on file   Social History Narrative   Two daughters, [name] and [name]  Lives alone in a house with some steps near the mailbox, but otherwise single story.  Does not use  a cane or walker or other assist device.  Active and gardens a lot.  Completes ADLS without assistance.       Review of Systems: General: negative for chills, fever, night sweats or weight changes.  Cardiovascular: negative for chest pain, dyspnea on exertion, orthopnea, palpitations, paroxysmal nocturnal dyspnea or shortness of breath Dermatological: negative for rash Respiratory: negative for cough or wheezing Urologic: negative for hematuria Abdominal: negative for nausea, vomiting, diarrhea, bright red blood per rectum, melena, or hematemesis Neurologic: negative for visual changes, syncope, or dizziness All other systems reviewed and are otherwise negative except as noted above.    Blood pressure 168/82, pulse 53, height 5\' 4"  (1.626 m),  weight 133 lb (60.328 kg).  General appearance: alert, cooperative and no distress Lungs: clear to auscultation bilaterally and kyphoscoliosis Heart: irregularly irregular rhythm and positive valve sounds Extremities: 2+ bilateral LE edema  EKG AF with VR 53, LVH with NSST changes  ASSESSMENT AND PLAN:   H/O mechanical mitral valve replacement 2001 St Jude MVR 2001  Chronic atrial fibrillation Tolerating this well  Chronic anticoagulation Followed by her primary care provider   PLAN  Dr [name] had wanted her to have an echo in [name] but the cancelled secondary to weather ands never rescheduled. We'll get that done now. I told her she could stop her Lanoxin if her HR is less than 50.   Amanda Richardson KPA-C 10/02/2013 11:57 AM

## 2013-10-10 ENCOUNTER — Ambulatory Visit (HOSPITAL_COMMUNITY)
Admission: RE | Admit: 2013-10-10 | Discharge: 2013-10-10 | Disposition: A | Discharge status: Home / Self Care | Payer: Medicare Other | Source: Ambulatory Visit | Attending: Cardiology | Admitting: Cardiology

## 2013-10-10 DIAGNOSIS — I059 Rheumatic mitral valve disease, unspecified: Secondary | ICD-10-CM | POA: Insufficient documentation

## 2013-10-10 DIAGNOSIS — I359 Nonrheumatic aortic valve disorder, unspecified: Secondary | ICD-10-CM

## 2013-10-10 DIAGNOSIS — I341 Nonrheumatic mitral (valve) prolapse: Secondary | ICD-10-CM

## 2013-10-10 NOTE — Progress Notes (Signed)
2D Echocardiogram Complete.  10/10/2013   Bethany McMahill, RDCS 

## 2013-10-30 ENCOUNTER — Telehealth: Payer: Self-pay | Admitting: Cardiovascular Disease

## 2013-10-30 NOTE — Telephone Encounter (Signed)
She requested copy of Echo report mailed to her.  Verified her address and mailed copy of Echo Report dated 10/10/13 along with ROI for her to sign and return.  LP

## 2013-10-30 NOTE — Telephone Encounter (Signed)
Returned call to patient she wanted to know 10/10/13 echo results.Message sent to Brita Romp RN Dr.Berry's nurse.

## 2013-10-30 NOTE — Telephone Encounter (Signed)
Wanted to get results of echo from 10/10/13  Please call.

## 2013-10-30 NOTE — Telephone Encounter (Signed)
Luke, Please review the echo that you ordered.

## 2013-10-31 NOTE — Telephone Encounter (Signed)
Patient notified of results.

## 2013-10-31 NOTE — Telephone Encounter (Signed)
Echo looks OK- follow up with Dr [name] in Dec. Call if any problems.  Kerin Ransom PA-C 10/31/2013 7:47 AM

## 2014-05-08 ENCOUNTER — Encounter: Payer: Self-pay | Admitting: Cardiovascular Disease

## 2014-05-08 ENCOUNTER — Ambulatory Visit (INDEPENDENT_AMBULATORY_CARE_PROVIDER_SITE_OTHER): Payer: Medicare Other | Admitting: Cardiovascular Disease

## 2014-05-08 VITALS — BP 188/92 | HR 70 | Ht 64.0 in | Wt 140.0 lb

## 2014-05-08 DIAGNOSIS — Z952 Presence of prosthetic heart valve: Secondary | ICD-10-CM

## 2014-05-08 DIAGNOSIS — I482 Chronic atrial fibrillation, unspecified: Secondary | ICD-10-CM

## 2014-05-08 DIAGNOSIS — Z954 Presence of other heart-valve replacement: Secondary | ICD-10-CM

## 2014-05-08 DIAGNOSIS — Z951 Presence of aortocoronary bypass graft: Secondary | ICD-10-CM

## 2014-05-08 DIAGNOSIS — I10 Essential (primary) hypertension: Secondary | ICD-10-CM

## 2014-05-08 NOTE — Assessment & Plan Note (Signed)
History of hypertension with blood pressure measured at 180/92. She is on lisinopril and atenolol. Her blood pressures at home are much better than this.

## 2014-05-08 NOTE — Assessment & Plan Note (Signed)
History of coronary artery bypass grafting at the time of her mitral valve replacement in 2001. She denies chest pain or shortness of breath.

## 2014-05-08 NOTE — Progress Notes (Signed)
05/08/2014 Hans Eden Magwood   10-01-1923  [id number]  Primary Physician Merrilee Seashore, MD Primary Cardiologist: Lorretta Harp MD Renae Gloss   HPI:  The patient is an 81 year old thin and frail-appearing widowed Caucasian female, mother of 2 daughter, whom I last saw 12 months ago. She saw Kerin Ransom PA-C 6 months ago. She was referred to be established in our practice back in August. Her history is remarkable for chronic AFib; on digoxin, beta-blocker, and Coumadin anticoagulation. She had mitral valve replacement back in 2001. She also has treated hypertension. She has a family history of heart disease with a brother who had an MI in his 38s, but she has never had a heart attack or stroke. She had an echo performed in our office March 02, 2012, revealing normal LV function with mildly dilated left ventricle and a well-functioning mechanical mitral prosthesis. She did have venous stasis changes and chronic venous insufficiency by Doppler. I sent her to Dr. [place] Hilty to evaluate her for venous ablation, who thought she was not a good candidate for this based on her anatomy. I did discontinue her amlodipine, began her on a low-dose ACE inhibitor and low-pressure compression stockings, which provided symptomatic improvement. She's been asymptomatic since she was seen by me one year ago. Recent 2-D echo performed 6 months ago revealed normal LV systolic function with a well-functioning mitral mechanical prosthesis.   Current Outpatient Prescriptions  Medication Sig Dispense Refill  . amoxicillin (AMOXIL) 500 MG capsule 500 mg as needed.   0  . atenolol (TENORMIN) 50 MG tablet Take 50 mg by mouth daily.    . calcium-vitamin D (OSCAL WITH D) 500-200 MG-UNIT per tablet Take 1 tablet by mouth daily.    . Cholecalciferol (VITAMIN D-3) 1000 UNITS CAPS Take 1 capsule by mouth 2 (two) times daily.    . clobetasol ointment (TEMOVATE) 4.85 % Apply 1 application topically as needed.     Marland Kitchen COUMADIN 5 MG tablet Take 5 mg by mouth as directed.   8  . digoxin (LANOXIN) 0.125 MG tablet Take 0.125 mg by mouth daily.    Marland Kitchen latanoprost (XALATAN) 0.005 % ophthalmic solution Place 1 drop into both eyes at bedtime.    Marland Kitchen levothyroxine (SYNTHROID, LEVOTHROID) 50 MCG tablet Take 50 mcg by mouth daily.  6  . lisinopril (PRINIVIL,ZESTRIL) 10 MG tablet Take 10 mg by mouth daily.   3  . Multiple Vitamin (MULTIVITAMIN WITH MINERALS) TABS Take 1 tablet by mouth daily.    . potassium chloride SA (K-DUR,KLOR-CON) 20 MEQ tablet Take 20 mEq by mouth daily.     No current facility-administered medications for this visit.    Allergies  Allergen Reactions  . Heparin Anaphylaxis    History   Social History  . Marital Status: Widowed    Spouse Name: N/A    Number of Children: 2  . Years of Education: 12   Occupational History  . Not on file.   Social History Main Topics  . Smoking status: Never Smoker   . Smokeless tobacco: Not on file  . Alcohol Use: No  . Drug Use: No  . Sexual Activity: Not on file   Other Topics Concern  . Not on file   Social History Narrative   Two daughters, [name] and [name]  Lives alone in a house with some steps near the mailbox, but otherwise single story.  Does not use a cane or walker or other assist device.  Active and gardens a  lot.  Completes ADLS without assistance.       Review of Systems: General: negative for chills, fever, night sweats or weight changes.  Cardiovascular: negative for chest pain, dyspnea on exertion, edema, orthopnea, palpitations, paroxysmal nocturnal dyspnea or shortness of breath Dermatological: negative for rash Respiratory: negative for cough or wheezing Urologic: negative for hematuria Abdominal: negative for nausea, vomiting, diarrhea, bright red blood per rectum, melena, or hematemesis Neurologic: negative for visual changes, syncope, or dizziness All other systems reviewed and are otherwise negative except as noted  above.    Blood pressure 188/92, pulse 70, height 5\' 4"  (1.626 m), weight 140 lb (63.504 kg).  General appearance: alert and no distress Neck: no adenopathy, no carotid bruit, no JVD, supple, symmetrical, trachea midline and thyroid not enlarged, symmetric, no tenderness/mass/nodules Lungs: clear to auscultation bilaterally Heart: irregularly irregular rhythm and crisp prosthetic valve sounds Extremities: 2-3+ pitting edema bilaterally  EKG atrial fibrillation with a ventricular response of 70 and lateral T-wave inversion. I personally reviewed this EKG  ASSESSMENT AND PLAN:   Hypertension History of hypertension with blood pressure measured at 180/92. She is on lisinopril and atenolol. Her blood pressures at home are much better than this.   H/O mechanical mitral valve replacement 2001 History of mitral valve replacement back in 2001. She is on chronic Coumadin anticoagulation. Recent 2-D echo performed this past June revealed normal LV size and function and normal prosthetic valve function as well.   H/O coronary artery bypass surgery History of coronary artery bypass grafting at the time of her mitral valve replacement in 2001. She denies chest pain or shortness of breath.   Chronic atrial fibrillation Chronic atrial fibrillation rate controlled on Coumadin anticoagulation. She is on digoxin and a beta blocker.       Lorretta Harp MD FACP,FACC,FAHA, [facility] 05/08/2014 12:26 PM

## 2014-05-08 NOTE — Assessment & Plan Note (Signed)
History of mitral valve replacement back in 2001. She is on chronic Coumadin anticoagulation. Recent 2-D echo performed this past June revealed normal LV size and function and normal prosthetic valve function as well.

## 2014-05-08 NOTE — Assessment & Plan Note (Signed)
Chronic atrial fibrillation rate controlled on Coumadin anticoagulation. She is on digoxin and a beta blocker.

## 2014-05-08 NOTE — Patient Instructions (Signed)
  We will see you back in follow up in 6 months with Lurena Joiner and 1 year with Dr [name].   Dr [name] has ordered: 1.  Echocardiogram (June 2016). Echocardiography is a painless test that uses sound waves to create images of your heart. It provides your doctor with information about the size and shape of your heart and how well your heart's chambers and valves are working. This procedure takes approximately one hour. There are no restrictions for this procedure.

## 2014-08-04 ENCOUNTER — Emergency Department (HOSPITAL_COMMUNITY): Payer: Medicare Other

## 2014-08-04 ENCOUNTER — Encounter (HOSPITAL_COMMUNITY): Payer: Self-pay | Admitting: Emergency Medicine

## 2014-08-04 ENCOUNTER — Inpatient Hospital Stay (HOSPITAL_COMMUNITY)
Admission: EM | Admit: 2014-08-04 | Discharge: 2014-08-11 | DRG: 871 | Disposition: A | Discharge status: Skilled Nursing Facility | Payer: Medicare Other | Attending: Internal Medicine | Admitting: Internal Medicine

## 2014-08-04 DIAGNOSIS — Z66 Do not resuscitate: Secondary | ICD-10-CM | POA: Diagnosis not present

## 2014-08-04 DIAGNOSIS — A419 Sepsis, unspecified organism: Secondary | ICD-10-CM | POA: Diagnosis not present

## 2014-08-04 DIAGNOSIS — I10 Essential (primary) hypertension: Secondary | ICD-10-CM | POA: Diagnosis present

## 2014-08-04 DIAGNOSIS — M7989 Other specified soft tissue disorders: Secondary | ICD-10-CM | POA: Diagnosis present

## 2014-08-04 DIAGNOSIS — I2510 Atherosclerotic heart disease of native coronary artery without angina pectoris: Secondary | ICD-10-CM | POA: Diagnosis present

## 2014-08-04 DIAGNOSIS — I482 Chronic atrial fibrillation, unspecified: Secondary | ICD-10-CM | POA: Diagnosis present

## 2014-08-04 DIAGNOSIS — T45515A Adverse effect of anticoagulants, initial encounter: Secondary | ICD-10-CM | POA: Diagnosis present

## 2014-08-04 DIAGNOSIS — Z7901 Long term (current) use of anticoagulants: Secondary | ICD-10-CM | POA: Diagnosis not present

## 2014-08-04 DIAGNOSIS — W19XXXA Unspecified fall, initial encounter: Secondary | ICD-10-CM | POA: Diagnosis present

## 2014-08-04 DIAGNOSIS — G459 Transient cerebral ischemic attack, unspecified: Secondary | ICD-10-CM

## 2014-08-04 DIAGNOSIS — I5033 Acute on chronic diastolic (congestive) heart failure: Secondary | ICD-10-CM | POA: Diagnosis present

## 2014-08-04 DIAGNOSIS — Z86718 Personal history of other venous thrombosis and embolism: Secondary | ICD-10-CM

## 2014-08-04 DIAGNOSIS — I509 Heart failure, unspecified: Secondary | ICD-10-CM

## 2014-08-04 DIAGNOSIS — J189 Pneumonia, unspecified organism: Secondary | ICD-10-CM | POA: Diagnosis present

## 2014-08-04 DIAGNOSIS — Z905 Acquired absence of kidney: Secondary | ICD-10-CM

## 2014-08-04 DIAGNOSIS — Z951 Presence of aortocoronary bypass graft: Secondary | ICD-10-CM

## 2014-08-04 DIAGNOSIS — R791 Abnormal coagulation profile: Secondary | ICD-10-CM | POA: Diagnosis present

## 2014-08-04 DIAGNOSIS — Z952 Presence of prosthetic heart valve: Secondary | ICD-10-CM

## 2014-08-04 DIAGNOSIS — R0602 Shortness of breath: Secondary | ICD-10-CM

## 2014-08-04 DIAGNOSIS — M6282 Rhabdomyolysis: Secondary | ICD-10-CM | POA: Diagnosis present

## 2014-08-04 DIAGNOSIS — L120 Bullous pemphigoid: Secondary | ICD-10-CM | POA: Diagnosis present

## 2014-08-04 DIAGNOSIS — R4189 Other symptoms and signs involving cognitive functions and awareness: Secondary | ICD-10-CM

## 2014-08-04 DIAGNOSIS — L129 Pemphigoid, unspecified: Secondary | ICD-10-CM | POA: Diagnosis present

## 2014-08-04 DIAGNOSIS — E039 Hypothyroidism, unspecified: Secondary | ICD-10-CM | POA: Diagnosis present

## 2014-08-04 DIAGNOSIS — R404 Transient alteration of awareness: Secondary | ICD-10-CM | POA: Diagnosis not present

## 2014-08-04 LAB — BLOOD GAS, ARTERIAL
ACID-BASE EXCESS: 0.2 mmol/L (ref 0.0–2.0)
Bicarbonate: 23.2 mEq/L (ref 20.0–24.0)
Drawn by: 257701
O2 Content: 4 L/min
O2 Saturation: 97 %
PH ART: 7.441 (ref 7.350–7.450)
Patient temperature: 100.1
TCO2: 20.5 mmol/L (ref 0–100)
pCO2 arterial: 34.9 mmHg — ABNORMAL LOW (ref 35.0–45.0)
pO2, Arterial: 102 mmHg — ABNORMAL HIGH (ref 80.0–100.0)

## 2014-08-04 LAB — URINALYSIS, ROUTINE W REFLEX MICROSCOPIC
GLUCOSE, UA: NEGATIVE mg/dL
Ketones, ur: NEGATIVE mg/dL
NITRITE: NEGATIVE
PH: 6 (ref 5.0–8.0)
SPECIFIC GRAVITY, URINE: 1.026 (ref 1.005–1.030)
Urobilinogen, UA: 4 mg/dL — ABNORMAL HIGH (ref 0.0–1.0)

## 2014-08-04 LAB — CBC WITH DIFFERENTIAL/PLATELET
BASOS ABS: 0 10*3/uL (ref 0.0–0.1)
BASOS PCT: 0 % (ref 0–1)
EOS ABS: 0 10*3/uL (ref 0.0–0.7)
EOS PCT: 0 % (ref 0–5)
HCT: 40.6 % (ref 36.0–46.0)
Hemoglobin: 14.1 g/dL (ref 12.0–15.0)
Lymphocytes Relative: 6 % — ABNORMAL LOW (ref 12–46)
Lymphs Abs: 0.5 10*3/uL — ABNORMAL LOW (ref 0.7–4.0)
MCH: 31.1 pg (ref 26.0–34.0)
MCHC: 34.7 g/dL (ref 30.0–36.0)
MCV: 89.6 fL (ref 78.0–100.0)
Monocytes Absolute: 0.9 10*3/uL (ref 0.1–1.0)
Monocytes Relative: 10 % (ref 3–12)
Neutro Abs: 7 10*3/uL (ref 1.7–7.7)
Neutrophils Relative %: 84 % — ABNORMAL HIGH (ref 43–77)
Platelets: 126 10*3/uL — ABNORMAL LOW (ref 150–400)
RBC: 4.53 MIL/uL (ref 3.87–5.11)
RDW: 14.2 % (ref 11.5–15.5)
WBC: 8.4 10*3/uL (ref 4.0–10.5)

## 2014-08-04 LAB — COMPREHENSIVE METABOLIC PANEL
ALT: 40 U/L — ABNORMAL HIGH (ref 0–35)
ANION GAP: 8 (ref 5–15)
AST: 118 U/L — ABNORMAL HIGH (ref 0–37)
Albumin: 3.5 g/dL (ref 3.5–5.2)
Alkaline Phosphatase: 90 U/L (ref 39–117)
BUN: 25 mg/dL — ABNORMAL HIGH (ref 6–23)
CO2: 24 mmol/L (ref 19–32)
Calcium: 8.3 mg/dL — ABNORMAL LOW (ref 8.4–10.5)
Chloride: 102 mmol/L (ref 96–112)
Creatinine, Ser: 0.92 mg/dL (ref 0.50–1.10)
GFR calc non Af Amer: 54 mL/min — ABNORMAL LOW (ref >90)
GFR, EST AFRICAN AMERICAN: 62 mL/min — AB (ref >90)
Glucose, Bld: 127 mg/dL — ABNORMAL HIGH (ref 70–99)
POTASSIUM: 4 mmol/L (ref 3.5–5.1)
SODIUM: 134 mmol/L — AB (ref 135–145)
Total Bilirubin: 2.1 mg/dL — ABNORMAL HIGH (ref 0.3–1.2)
Total Protein: 6.5 g/dL (ref 6.0–8.3)

## 2014-08-04 LAB — PROTIME-INR
INR: 1.66 — ABNORMAL HIGH (ref 0.00–1.49)
Prothrombin Time: 19.8 seconds — ABNORMAL HIGH (ref 11.6–15.2)

## 2014-08-04 LAB — I-STAT CG4 LACTIC ACID, ED
LACTIC ACID, VENOUS: 1.13 mmol/L (ref 0.5–2.0)
Lactic Acid, Venous: 0.94 mmol/L (ref 0.5–2.0)

## 2014-08-04 LAB — DIGOXIN LEVEL: Digoxin Level: 0.3 ng/mL — ABNORMAL LOW (ref 0.8–2.0)

## 2014-08-04 LAB — URINE MICROSCOPIC-ADD ON

## 2014-08-04 LAB — CK: CK TOTAL: 2709 U/L — AB (ref 7–177)

## 2014-08-04 MED ORDER — ATENOLOL 50 MG PO TABS
50.0000 mg | ORAL_TABLET | Freq: Every day | ORAL | Status: DC
  Administered 2014-08-05 – 2014-08-11 (×7): 50 mg via ORAL
  Filled 2014-08-04 (×2): qty 1
  Filled 2014-08-04: qty 2
  Filled 2014-08-04: qty 1
  Filled 2014-08-04: qty 2
  Filled 2014-08-04 (×2): qty 1

## 2014-08-04 MED ORDER — DEXTROSE 5 % IV SOLN
1.0000 g | INTRAVENOUS | Status: DC
  Administered 2014-08-05 – 2014-08-10 (×6): 1 g via INTRAVENOUS
  Filled 2014-08-04 (×7): qty 10

## 2014-08-04 MED ORDER — WARFARIN SODIUM 5 MG PO TABS
5.0000 mg | ORAL_TABLET | ORAL | Status: AC
  Administered 2014-08-04: 5 mg via ORAL
  Filled 2014-08-04: qty 1

## 2014-08-04 MED ORDER — LEVOTHYROXINE SODIUM 50 MCG PO TABS
50.0000 ug | ORAL_TABLET | Freq: Every day | ORAL | Status: DC
  Administered 2014-08-05 – 2014-08-11 (×7): 50 ug via ORAL
  Filled 2014-08-04 (×7): qty 1

## 2014-08-04 MED ORDER — LATANOPROST 0.005 % OP SOLN
1.0000 [drp] | Freq: Every day | OPHTHALMIC | Status: DC
  Administered 2014-08-04 – 2014-08-10 (×7): 1 [drp] via OPHTHALMIC
  Filled 2014-08-04: qty 2.5

## 2014-08-04 MED ORDER — CEFTRIAXONE SODIUM 1 G IJ SOLR
1.0000 g | Freq: Once | INTRAMUSCULAR | Status: AC
  Administered 2014-08-04: 1 g via INTRAVENOUS
  Filled 2014-08-04: qty 10

## 2014-08-04 MED ORDER — DEXTROSE 5 % IV SOLN
500.0000 mg | Freq: Once | INTRAVENOUS | Status: AC
  Administered 2014-08-04: 500 mg via INTRAVENOUS
  Filled 2014-08-04: qty 500

## 2014-08-04 MED ORDER — VANCOMYCIN HCL 500 MG IV SOLR
500.0000 mg | Freq: Two times a day (BID) | INTRAVENOUS | Status: DC
  Administered 2014-08-04 – 2014-08-06 (×4): 500 mg via INTRAVENOUS
  Filled 2014-08-04 (×5): qty 500

## 2014-08-04 MED ORDER — WARFARIN - PHARMACIST DOSING INPATIENT: Freq: Every day | Status: DC

## 2014-08-04 MED ORDER — SODIUM CHLORIDE 0.9 % IV BOLUS (SEPSIS)
1000.0000 mL | Freq: Once | INTRAVENOUS | Status: AC
  Administered 2014-08-04: 1000 mL via INTRAVENOUS

## 2014-08-04 MED ORDER — AZITHROMYCIN 500 MG IV SOLR
500.0000 mg | INTRAVENOUS | Status: DC
  Administered 2014-08-05 – 2014-08-10 (×6): 500 mg via INTRAVENOUS
  Filled 2014-08-04 (×8): qty 500

## 2014-08-04 MED ORDER — DIGOXIN 0.0625 MG HALF TABLET
0.0625 mg | ORAL_TABLET | Freq: Every day | ORAL | Status: DC
  Administered 2014-08-05 – 2014-08-11 (×7): 0.0625 mg via ORAL
  Filled 2014-08-04 (×7): qty 1

## 2014-08-04 MED ORDER — SODIUM CHLORIDE 0.9 % IV SOLN
INTRAVENOUS | Status: DC
  Administered 2014-08-04 – 2014-08-05 (×2): via INTRAVENOUS

## 2014-08-04 MED ORDER — ASPIRIN EC 325 MG PO TBEC
325.0000 mg | DELAYED_RELEASE_TABLET | Freq: Every day | ORAL | Status: DC
  Administered 2014-08-05 – 2014-08-09 (×5): 325 mg via ORAL
  Filled 2014-08-04 (×7): qty 1

## 2014-08-04 NOTE — ED Notes (Signed)
MD made aware of rectal temp and lactic acid level.

## 2014-08-04 NOTE — Progress Notes (Addendum)
ANTICOAGULATION CONSULT NOTE - Initial Consult  Pharmacy Consult for Warfarin Indication: MVR  Allergies  Allergen Reactions  . Heparin Anaphylaxis    Patient Measurements: Weight: 135 lb (61.236 kg)  Vital Signs: Temp: 104.9 F (40.5 C) (04/12 1704) Temp Source: Rectal (04/12 1704) BP: 172/87 mmHg (04/12 1601) Pulse Rate: 84 (04/12 1845)  Labs:  Recent Labs  08/04/14 1658  HGB 14.1  HCT 40.6  PLT 126*  LABPROT 19.8*  INR 1.66*  CREATININE 0.92  CKTOTAL 2709*    Estimated Creatinine Clearance: 35.8 mL/min (by C-G formula based on Cr of 0.92).   Medical History: Past Medical History  Diagnosis Date  . H/O mitral valve replacement 2001    mechanical heart valve; family states indication may have been MR from MVP or  rheumatic heart disease  . Thyroid disease   . Hypertension   . CHF (congestive heart failure)   . S/p nephrectomy     left  . Coronary artery disease   . 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 Urinary incontinence   . Atrial fibrillation   . DVT (deep venous thrombosis)     possible in 1990s and does not remember being on blood thinner  . Hypothyroidism   . Chronic anemia     heavy periods  . Glaucoma   . Bullous pemphigoid   . Chronic anticoagulation   . Basal cell carcinoma   . Allergic rhinitis   . Bilateral lower extremity edema     history of venous insufficiency  . Shortness of breath dyspnea      Assessment: Amanda Richardson on chronic warfarin for history of mechanical MVR (2001) and atrial fibrillation.  Presents following fall Monday night due to weakness, shortness of breath, and fever. Code sepsis called.  Pharmacy consulted to manage warfarin therapy inpatient.  Home warfarin dose reported as 5 mg daily except takes 2.5 mg dose on Mondays, Tuesdays, and Thursdays.  Last dose reported as past week.    Today's INR 1.66 CBC: H/H wnl, Plts 126 Note antibiotics may interact with warfarin and cause elevated INR  Goal of  Therapy:  INR 2.5-3.5   Plan:  1.  Warfarin 5 mg po once tonight. 2.  Spoke with MD regarding a bridging agent while INR is subtherapeutic.  Patient has mechanical valve and afib as an additional thromboembolic risk factor. Patient's family reports that patient had an anaphylactic reaction to heparin in 2013 (given following a procedure).  MD will discuss options with cardiology.   Hershal Coria 08/04/2014,7:21 PM  Addendum: 08/04/2014 8:36 PM MD discussed with patient's family the options of bridging with fondaparinux versus continue with no bridging agent.  Family chose to continue with warfarin therapy without bridging agent.  Hershal Coria, PharmD, BCPS Pager: [phone] 08/04/2014 8:37 PM

## 2014-08-04 NOTE — ED Notes (Signed)
Code sepsis called to [facility].Marland Kitchenklj

## 2014-08-04 NOTE — ED Notes (Signed)
Per ems pt from home, co fall mondays night due to weakness. Pt has not been evaluated for fall. Today's issue is mainly gl weakness. Pt also co fever. Per daughter urine appears dark. Pt denies dysuria, nausea or flank pain. Pt also reports SOB , Hx CHF. O2 sat 97 on 2 L. Pt has labored RR. Pt on Coumadin.

## 2014-08-04 NOTE — Progress Notes (Addendum)
ANTIBIOTIC CONSULT NOTE - INITIAL  Pharmacy Consult for Vancomycin Indication: pneumonia  Allergies  Allergen Reactions  . Heparin Anaphylaxis    Patient Measurements: Weight: 135 lb (61.236 kg)  Vital Signs: Temp: 104.9 F (40.5 C) (04/12 1704) Temp Source: Rectal (04/12 1704) BP: 115/96 mmHg (04/12 2030) Pulse Rate: 88 (04/12 2030) Intake/Output from previous day:   Intake/Output from this shift:    Labs:  Recent Labs  08/04/14 1658  WBC 8.4  HGB 14.1  PLT 126*  CREATININE 0.92   Estimated Creatinine Clearance: 35.8 mL/min (by C-G formula based on Cr of 0.92). No results for input(s): VANCOTROUGH, VANCOPEAK, VANCORANDOM, GENTTROUGH, GENTPEAK, GENTRANDOM, TOBRATROUGH, TOBRAPEAK, TOBRARND, AMIKACINPEAK, AMIKACINTROU, AMIKACIN in the last 72 hours.   Microbiology: No results found for this or any previous visit (from the past 720 hour(s)).  Medical History: Past Medical History  Diagnosis Date  . H/O mitral valve replacement 2001    mechanical heart valve; family states indication may have been MR from MVP or  rheumatic heart disease  . Thyroid disease   . Hypertension   . CHF (congestive heart failure)   . S/p nephrectomy     left  . Coronary artery disease   . 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 Urinary incontinence   . Atrial fibrillation   . DVT (deep venous thrombosis)     possible in 1990s and does not remember being on blood thinner  . Hypothyroidism   . Chronic anemia     heavy periods  . Glaucoma   . Bullous pemphigoid   . Chronic anticoagulation   . Basal cell carcinoma   . Allergic rhinitis   . Bilateral lower extremity edema     history of venous insufficiency  . Shortness of breath dyspnea     Assessment: 31 yoF admitted with shortness of breath, weakness, fever.  Patient's daughter reports dark urine.  CXR in ED questions pneumonia.  Code sepsis called and patient admitted.  Azithromycin and Ceftriaxone given in ED.   Pharmacy consulted to dose vancomycin in addition to Azith/CTX for sepsis secondary to CAP, ICU admission.  4/12 >> Azithromycin >> 4/12 >> Ceftriaxone >> 4/12 >> Vancomycin >>  Tmax: 104.9 WBC: WNL Renal: SCr 0.92, CrCl~40 ml/min (CG)  Blood and urine cultures collected.  Goal of Therapy:  Vancomycin trough level 15-20 mcg/ml Doses adjusted per renal function Eradication of infection  Plan:  1.  Vancomycin 500mg  IV q12h. 2.  F/u SCr, trough levels, de-escalation of antibiotics.   Hershal Coria 08/04/2014,8:45 PM

## 2014-08-04 NOTE — Progress Notes (Signed)
EDCM spoke to patient and her son in law Harrington Challenger and daughter Daleen Snook.  Patient lives alone.  Patient reports she does not have home health services and has never had home health services.  Patient has a walker, several canes, shower chair, elevated toilet seat, wheelchair is available at home.  Patient reports she is able to complete her ADL's on her own.  Patient also reports she does her own cooking and cleaning.  Patient noted to be wearing oxygen in the ED.  Patient does not wear oxygen at home.  EDCM provided patient with list of home health agencies in [facility], explained services.  EDCM informed patient and her family that home health agency of their choice has 24-48 hours to contact them.  EDCM also provided patient with list of private duty nursing services, explained services and that it would be an out of pocket expense.  Patient and family thankful for resources.  No further EDCM needs at this time.

## 2014-08-04 NOTE — ED Provider Notes (Signed)
CSN: [id number]     Arrival date & time 08/04/14  1607 History   First MD Initiated Contact with Patient 08/04/14 1610     Chief Complaint  Patient presents with  . Shortness of Breath    fall     (Consider location/radiation/quality/duration/timing/severity/associated sxs/prior Treatment) Patient is a 81 y.o. female presenting with general illness.  Illness Quality:  Generalized weakness Severity:  Severe Onset quality:  Gradual Duration:  3 days Timing:  Constant Progression:  Worsening Chronicity:  New Context:  Started feeling weak two days ago.  Fell yesterday and was on the floor for about 10 hours.  Today, has had worsening weakness as well as subjective fevers and mild cough. Relieved by:  Nothing Worsened by:  Nothing Associated symptoms: shortness of breath   Associated symptoms: no abdominal pain, no chest pain, no nausea and no vomiting     Past Medical History  Diagnosis Date  . H/O mitral valve replacement 2001    mechanical heart valve; family states indication may have been MR from MVP or  rheumatic heart disease  . Thyroid disease   . Hypertension   . CHF (congestive heart failure)   . S/p nephrectomy     left  . Coronary artery disease   . 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 Urinary incontinence   . Atrial fibrillation   . DVT (deep venous thrombosis)     possible in 1990s and does not remember being on blood thinner  . Hypothyroidism   . Chronic anemia     heavy periods  . Glaucoma   . Bullous pemphigoid   . Chronic anticoagulation   . Basal cell carcinoma   . Allergic rhinitis   . Bilateral lower extremity edema     history of venous insufficiency   Past Surgical History  Procedure Laterality Date  . Abdominal hysterectomy  1960s  . Laparoscopic small bowel resection  2004 or 5    adhesions and ischemic bowel  . Mitral valve replacement  01/2000    mechanical heart valve  . Coronary artery bypass graft  01/2000  .  Appendectomy  1960s  . Tonsillectomy  1933  . Hx broken collarbone    . Transthoracic echocardiogram  02/2012    EF 60-65%, mild LVH; mildly thickened AV leaflets with sclerosis and moderate regurg; mechanical MV prosthesis; LA dilated; RVSP increased; RA severely dilated; increased LA pressure; moderate TR   Family History  Problem Relation Age of Onset  . Irregular heart beat Mother   . Other Mother     pellegra  . Heart failure Mother     had very swollen legs??  . Stroke Mother   . Prostate cancer Father     also skin cancer  . Heart attack Daughter   . Heart disease Brother   . Heart attack Brother 61    still alive   History  Substance Use Topics  . Smoking status: Never Smoker   . Smokeless tobacco: Not on file  . Alcohol Use: No   OB History    No data available     Review of Systems  Respiratory: Positive for shortness of breath.   Cardiovascular: Negative for chest pain.  Gastrointestinal: Negative for nausea, vomiting and abdominal pain.  All other systems reviewed and are negative.     Allergies  Heparin  Home Medications   Prior to Admission medications   Medication Sig Start Date End Date Taking?  Authorizing Provider  amoxicillin (AMOXIL) 500 MG capsule 500 mg as needed.  04/03/14   Historical Provider, MD  atenolol (TENORMIN) 50 MG tablet Take 50 mg by mouth daily.    Historical Provider, MD  calcium-vitamin D (OSCAL WITH D) 500-200 MG-UNIT per tablet Take 1 tablet by mouth daily.    Historical Provider, MD  Cholecalciferol (VITAMIN D-3) 1000 UNITS CAPS Take 1 capsule by mouth 2 (two) times daily.    Historical Provider, MD  clobetasol ointment (TEMOVATE) 3.71 % Apply 1 application topically as needed.    Historical Provider, MD  COUMADIN 5 MG tablet Take 5 mg by mouth as directed.  04/23/14   Historical Provider, MD  digoxin (LANOXIN) 0.125 MG tablet Take 0.125 mg by mouth daily.    Historical Provider, MD  latanoprost (XALATAN) 0.005 % ophthalmic  solution Place 1 drop into both eyes at bedtime.    Historical Provider, MD  levothyroxine (SYNTHROID, LEVOTHROID) 50 MCG tablet Take 50 mcg by mouth daily. 05/05/14   Historical Provider, MD  lisinopril (PRINIVIL,ZESTRIL) 10 MG tablet Take 10 mg by mouth daily.  03/27/14   Historical Provider, MD  Multiple Vitamin (MULTIVITAMIN WITH MINERALS) TABS Take 1 tablet by mouth daily.    Historical Provider, MD  potassium chloride SA (K-DUR,KLOR-CON) 20 MEQ tablet Take 20 mEq by mouth daily.    Historical Provider, MD   BP 172/87 mmHg  Pulse 97  Temp(Src) 99.8 F (37.7 C) (Oral)  Resp 22  SpO2 92% Physical Exam  Constitutional: She is oriented to person, place, and time. She appears well-developed and well-nourished. No distress.  Warm to the touch  HENT:  Head: Normocephalic and atraumatic.  Mouth/Throat: Oropharynx is clear and moist.  Eyes: Conjunctivae are normal. Pupils are equal, round, and reactive to light. No scleral icterus.  Neck: Neck supple.  Cardiovascular: Normal rate, regular rhythm, normal heart sounds and intact distal pulses.   No murmur heard. Pulmonary/Chest: Effort normal. No stridor. Tachypnea noted. No respiratory distress. She has rales (bibasilar).  Abdominal: Soft. Bowel sounds are normal. She exhibits no distension. There is no tenderness.  Musculoskeletal: Normal range of motion.  Neurological: She is alert and oriented to person, place, and time.  Skin: Skin is warm and dry. No rash noted.  Psychiatric: She has a normal mood and affect. Her behavior is normal.  Nursing note and vitals reviewed.   ED Course  Procedures (including critical care time) Labs Review Labs Reviewed  CBC WITH DIFFERENTIAL/PLATELET - Abnormal; Notable for the following:    Platelets 126 (*)    Neutrophils Relative % 84 (*)    Lymphocytes Relative 6 (*)    Lymphs Abs 0.5 (*)    All other components within normal limits  COMPREHENSIVE METABOLIC PANEL - Abnormal; Notable for the  following:    Sodium 134 (*)    Glucose, Bld 127 (*)    BUN 25 (*)    Calcium 8.3 (*)    AST 118 (*)    ALT 40 (*)    Total Bilirubin 2.1 (*)    GFR calc non Af Amer 54 (*)    GFR calc Af Amer 62 (*)    All other components within normal limits  URINALYSIS, ROUTINE W REFLEX MICROSCOPIC - Abnormal; Notable for the following:    Color, Urine ORANGE (*)    APPearance CLOUDY (*)    Hgb urine dipstick LARGE (*)    Bilirubin Urine MODERATE (*)    Protein, ur >300 (*)  Urobilinogen, UA 4.0 (*)    Leukocytes, UA TRACE (*)    All other components within normal limits  BLOOD GAS, ARTERIAL - Abnormal; Notable for the following:    pCO2 arterial 34.9 (*)    pO2, Arterial 102.0 (*)    All other components within normal limits  PROTIME-INR - Abnormal; Notable for the following:    Prothrombin Time 19.8 (*)    INR 1.66 (*)    All other components within normal limits  CK - Abnormal; Notable for the following:    Total CK 2709 (*)    All other components within normal limits  DIGOXIN LEVEL - Abnormal; Notable for the following:    Digoxin Level 0.3 (*)    All other components within normal limits  URINE MICROSCOPIC-ADD ON - Abnormal; Notable for the following:    Bacteria, UA FEW (*)    All other components within normal limits  CULTURE, BLOOD (ROUTINE X 2)  CULTURE, BLOOD (ROUTINE X 2)  URINE CULTURE  CULTURE, EXPECTORATED SPUTUM-ASSESSMENT  GRAM STAIN  MRSA PCR SCREENING  PROTIME-INR  HIV ANTIBODY (ROUTINE TESTING)  LEGIONELLA ANTIGEN, URINE  STREP PNEUMONIAE URINARY ANTIGEN  CBC WITH DIFFERENTIAL/PLATELET  COMPREHENSIVE METABOLIC PANEL  I-STAT CG4 LACTIC ACID, ED  I-STAT CG4 LACTIC ACID, ED    Imaging Review Ct Head Wo Contrast  08/04/2014   CLINICAL DATA:  [place] Monday secondary weakness. Headache. On blood thinners.  EXAM: CT HEAD WITHOUT CONTRAST  CT CERVICAL SPINE WITHOUT CONTRAST  TECHNIQUE: Multidetector CT imaging of the head and cervical spine was performed  following the standard protocol without intravenous contrast. Multiplanar CT image reconstructions of the cervical spine were also generated.  COMPARISON:  None  FINDINGS: CT HEAD FINDINGS  Sinuses/Soft tissues: No significant soft tissue swelling. Minimal motion degradation. No skull fracture. Clear paranasal sinuses and mastoid air cells.  Intracranial: Mild for age low density in the periventricular white matter likely related to small vessel disease. Expected cerebral atrophy. Suspect a remote lacunar infarct in the right basal ganglia. No mass lesion, hemorrhage, hydrocephalus, acute infarct, intra-axial, or extra-axial fluid collection.  CT CERVICAL SPINE FINDINGS  Spinal visualization through the bottom of T2. Mild motion degradation. Prevertebral soft tissues are within normal limits. Carotid atherosclerosis, worse on the left. No apical pneumothorax. Patient is oblique in the scanner. Relative rotation of C1 relative to C2 is felt to be positional. Grossly maintained vertebral body height and alignment. Multilevel facet arthropathy.  IMPRESSION: 1.  No acute intracranial abnormality.  Minimal motion degradation. 2. Motion degraded evaluation of the cervical spine. No gross fracture or subluxation identified. If symptoms warrant, repeat should be considered when patient is able to hold still.   Electronically Signed   By: Abigail Miyamoto M.D.   On: 08/04/2014 20:14   Ct Cervical Spine Wo Contrast  08/04/2014   CLINICAL DATA:  [place] Monday secondary weakness. Headache. On blood thinners.  EXAM: CT HEAD WITHOUT CONTRAST  CT CERVICAL SPINE WITHOUT CONTRAST  TECHNIQUE: Multidetector CT imaging of the head and cervical spine was performed following the standard protocol without intravenous contrast. Multiplanar CT image reconstructions of the cervical spine were also generated.  COMPARISON:  None  FINDINGS: CT HEAD FINDINGS  Sinuses/Soft tissues: No significant soft tissue swelling. Minimal motion degradation. No  skull fracture. Clear paranasal sinuses and mastoid air cells.  Intracranial: Mild for age low density in the periventricular white matter likely related to small vessel disease. Expected cerebral atrophy. Suspect a remote lacunar infarct in  the right basal ganglia. No mass lesion, hemorrhage, hydrocephalus, acute infarct, intra-axial, or extra-axial fluid collection.  CT CERVICAL SPINE FINDINGS  Spinal visualization through the bottom of T2. Mild motion degradation. Prevertebral soft tissues are within normal limits. Carotid atherosclerosis, worse on the left. No apical pneumothorax. Patient is oblique in the scanner. Relative rotation of C1 relative to C2 is felt to be positional. Grossly maintained vertebral body height and alignment. Multilevel facet arthropathy.  IMPRESSION: 1.  No acute intracranial abnormality.  Minimal motion degradation. 2. Motion degraded evaluation of the cervical spine. No gross fracture or subluxation identified. If symptoms warrant, repeat should be considered when patient is able to hold still.   Electronically Signed   By: Abigail Miyamoto M.D.   On: 08/04/2014 20:14   Dg Chest Port 1 View  08/04/2014   CLINICAL DATA:  Shortness of breath, fell Monday night due to weakness, fever, history CHF, hypertension, atrial fibrillation, mitral valve disease  EXAM: PORTABLE CHEST - 1 VIEW  COMPARISON:  Portable exam 1713 hours without priors for comparison.  FINDINGS: Enlargement of cardiac silhouette post MVR.  Calcified tortuous aorta.  Pulmonary vascularity normal.  Significant infiltrates identified RIGHT middle and RIGHT lower lobes, likely also RIGHT upper lobe.  LEFT lung grossly clear.  Questionable small RIGHT pleural effusion.  No LEFT pleural effusion or pneumothorax.  Bones demineralized.  IMPRESSION: Enlargement of cardiac silhouette with vascular congestion post MVR.  RIGHT lung infiltrates question pneumonia.   Electronically Signed   By: Lavonia Dana M.D.   On: 08/04/2014 17:38      EKG Interpretation   Date/Time:  Tuesday August 04 2014 16:23:06 EDT Ventricular Rate:  102 PR Interval:    QRS Duration: 101 QT Interval:  337 QTC Calculation: 439 R Axis:   111 Text Interpretation:  Atrial fibrillation Ventricular premature complex  Anterolateral infarct, age indeterminate No significant change was found  Confirmed by [facility]  MD, TREY ([id number]) on 08/04/2014 5:18:37 PM      MDM   Final diagnoses:  Fall  Warfarin anticoagulation  CAP  81 yo female presenting with generalized weakness and cough.  Fell early yesterday morning and was too weak to get up.  In ED, she was found to be febrile and hypoxic.  Code sepsis called.  Treated for CAP empirically.  CXR with possible pneumonia.  She has been admitted to internal medicine.     Serita Grit, MD 08/05/14 [phone]

## 2014-08-04 NOTE — ED Notes (Signed)
Patient is not in the room.  I will collect lab when she return.

## 2014-08-04 NOTE — H&P (Signed)
[facility] History and Physical  Patient: Amanda Richardson  MRN: [id number]  DOB: 07-18-1923  DOS: the patient was seen and examined on 08/04/2014 PCP: Merrilee Seashore, MD  Chief Complaint: Fall and shortness of breath with weakness  HPI: Amanda Richardson is a 81 y.o. 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 The patient is presenting with complaints of cough and shortness of breath ongoing since last one week. Patient has been feeling generalized weakness since last one day progressively worsening. Sundays she had some cough without any active mucus production. She also felt febrile that day. She has been feeling weak and progressively worsening since then. On Monday she had a fall when she was leaning forward. She has been living alone and has been on the ground for roughly 12 hours before her family found her on the floor. She denied any unconsciousness. Tuesday she continues to remain progressively worsening weakness and therefore they brought her to the hospital. No nausea no vomiting no abdominal pain no chest pain no constipation no diarrhea reported. No blood in the urine reported. She complains of some burning urination which is chronic. She has chronic leg swelling which is improving. She denies any leg tenderness. She has missed her dose of warfarin on Monday. Recently her warfarin level has been adjusted as her INR was elevated. She has a history of an allergic reaction with heparin and 2004 when she was admitted for small bowel obstruction at which time when they put her on heparin, as per family "she almost died"and they had to take her to ICU and at the time of the discharge she were informed that "do not give her heparin again".  The patient is coming from home. And at her  baseline independent for most of her ADL.  Review of Systems: as mentioned in the history of present illness.  A comprehensive review of the other systems is negative.  Past Medical History  Diagnosis Date  . H/O mitral valve replacement 2001    mechanical heart valve; family states indication may have been MR from MVP or  rheumatic heart disease  . Thyroid disease   . Hypertension   . CHF (congestive heart failure)   . S/p nephrectomy     left  . Coronary artery disease   . 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 Urinary incontinence   . Atrial fibrillation   . DVT (deep venous thrombosis)     possible in 1990s and does not remember being on blood thinner  . Hypothyroidism   . Chronic anemia     heavy periods  . Glaucoma   . Bullous pemphigoid   . Chronic anticoagulation   . Basal cell carcinoma   . Allergic rhinitis   . Bilateral lower extremity edema     history of venous insufficiency  . Shortness of breath dyspnea    Past Surgical History  Procedure Laterality Date  . Abdominal hysterectomy  1960s  . Laparoscopic small bowel resection  2004 or 5    adhesions and ischemic bowel  . Mitral valve replacement  01/2000    mechanical heart valve  . Coronary artery bypass graft  01/2000  . Appendectomy  1960s  . Tonsillectomy  1933  . Hx broken collarbone    . Transthoracic echocardiogram  02/2012    EF 60-65%, mild LVH; mildly thickened  AV leaflets with sclerosis and moderate regurg; mechanical MV prosthesis; LA dilated; RVSP increased; RA severely dilated; increased LA pressure; moderate TR  . Hernia repair     Social History:  reports that she has never smoked. She has never used smokeless tobacco. She reports that she does not drink alcohol or use illicit drugs.  Allergies  Allergen Reactions  . Heparin Anaphylaxis    Family History  Problem Relation Age of Onset  . Irregular heart beat Mother   . Other Mother     pellegra  . Heart failure  Mother     had very swollen legs??  . Stroke Mother   . Prostate cancer Father     also skin cancer  . Heart attack Daughter   . Heart disease Brother   . Heart attack Brother 58    still alive    Prior to Admission medications   Medication Sig Start Date End Date Taking? Authorizing Provider  atenolol (TENORMIN) 50 MG tablet Take 50 mg by mouth daily.   Yes Historical Provider, MD  calcium-vitamin D (OSCAL WITH D) 500-200 MG-UNIT per tablet Take 1 tablet by mouth daily.   Yes Historical Provider, MD  Cholecalciferol (VITAMIN D-3) 1000 UNITS CAPS Take 2 capsules by mouth daily.    Yes Historical Provider, MD  clobetasol ointment (TEMOVATE) 1.77 % Apply 1 application topically as needed (blisters for bulous pemphigoid).    Yes Historical Provider, MD  COUMADIN 5 MG tablet Take 5 mg by mouth as directed. Monday, Tuesday and Thursday take 2.5mg  Saturday, Sunday, Wednesday, and Friday= 5mg  04/23/14  Yes Historical Provider, MD  digoxin (LANOXIN) 0.125 MG tablet Take 0.0625 mg by mouth daily.    Yes Historical Provider, MD  latanoprost (XALATAN) 0.005 % ophthalmic solution Place 1 drop into both eyes at bedtime.   Yes Historical Provider, MD  levothyroxine (SYNTHROID, LEVOTHROID) 50 MCG tablet Take 50 mcg by mouth daily. 05/05/14  Yes Historical Provider, MD  lisinopril (PRINIVIL,ZESTRIL) 10 MG tablet Take 10 mg by mouth daily.  03/27/14  Yes Historical Provider, MD  Multiple Vitamin (MULTIVITAMIN WITH MINERALS) TABS Take 1 tablet by mouth daily.   Yes Historical Provider, MD  amoxicillin (AMOXIL) 500 MG capsule 500 mg as needed.  04/03/14   Historical Provider, MD    Physical Exam: Filed Vitals:   08/04/14 1704 08/04/14 1845 08/04/14 2007 08/04/14 2030  BP:   149/78 115/96  Pulse:  84 96 88  Temp: 104.9 F (40.5 C)     TempSrc: Rectal     Resp:  28 24 25   Weight:      SpO2:  97% 94% 96%    General: Alert, Awake and Oriented to Time, Place and Person. Appear in marked distress Eyes:  PERRL ENT: Oral Mucosa clear, tongue has bluish discoloration moist. Neck: Mild JVD Cardiovascular: S1 and S2 Present, aortic systolic Murmur, Peripheral Pulses Present Respiratory: Bilateral Air entry equal and Decreased,  Bilateral basal crackles, no wheezes Abdomen: Bowel Sound present, Soft and non- tender Skin: Lower extremity eczema Rash Extremities: Bilateral Pedal edema, no calf tenderness Neurologic: Grossly no focal neuro deficit.  Labs on Admission:  CBC:  Recent Labs Lab 08/04/14 1658  WBC 8.4  NEUTROABS 7.0  HGB 14.1  HCT 40.6  MCV 89.6  PLT 126*    CMP     Component Value Date/Time   NA 134* 08/04/2014 1658   K 4.0 08/04/2014 1658   CL 102 08/04/2014 1658   CO2 24 08/04/2014  1658   GLUCOSE 127* 08/04/2014 1658   BUN 25* 08/04/2014 1658   CREATININE 0.92 08/04/2014 1658   CALCIUM 8.3* 08/04/2014 1658   PROT 6.5 08/04/2014 1658   ALBUMIN 3.5 08/04/2014 1658   AST 118* 08/04/2014 1658   ALT 40* 08/04/2014 1658   ALKPHOS 90 08/04/2014 1658   BILITOT 2.1* 08/04/2014 1658   GFRNONAA 54* 08/04/2014 1658   GFRAA 62* 08/04/2014 1658    No results for input(s): LIPASE, AMYLASE in the last 168 hours.   Recent Labs Lab 08/04/14 1658  CKTOTAL 2709*   BNP (last 3 results) No results for input(s): BNP in the last 8760 hours.  ProBNP (last 3 results) No results for input(s): PROBNP in the last 8760 hours.   Radiological Exams on Admission: Ct Head Wo Contrast  08/04/2014   CLINICAL DATA:  [place] Monday secondary weakness. Headache. On blood thinners.  EXAM: CT HEAD WITHOUT CONTRAST  CT CERVICAL SPINE WITHOUT CONTRAST  TECHNIQUE: Multidetector CT imaging of the head and cervical spine was performed following the standard protocol without intravenous contrast. Multiplanar CT image reconstructions of the cervical spine were also generated.  COMPARISON:  None  FINDINGS: CT HEAD FINDINGS  Sinuses/Soft tissues: No significant soft tissue swelling. Minimal motion  degradation. No skull fracture. Clear paranasal sinuses and mastoid air cells.  Intracranial: Mild for age low density in the periventricular white matter likely related to small vessel disease. Expected cerebral atrophy. Suspect a remote lacunar infarct in the right basal ganglia. No mass lesion, hemorrhage, hydrocephalus, acute infarct, intra-axial, or extra-axial fluid collection.  CT CERVICAL SPINE FINDINGS  Spinal visualization through the bottom of T2. Mild motion degradation. Prevertebral soft tissues are within normal limits. Carotid atherosclerosis, worse on the left. No apical pneumothorax. Patient is oblique in the scanner. Relative rotation of C1 relative to C2 is felt to be positional. Grossly maintained vertebral body height and alignment. Multilevel facet arthropathy.  IMPRESSION: 1.  No acute intracranial abnormality.  Minimal motion degradation. 2. Motion degraded evaluation of the cervical spine. No gross fracture or subluxation identified. If symptoms warrant, repeat should be considered when patient is able to hold still.   Electronically Signed   By: Abigail Miyamoto M.D.   On: 08/04/2014 20:14   Ct Cervical Spine Wo Contrast  08/04/2014   CLINICAL DATA:  [place] Monday secondary weakness. Headache. On blood thinners.  EXAM: CT HEAD WITHOUT CONTRAST  CT CERVICAL SPINE WITHOUT CONTRAST  TECHNIQUE: Multidetector CT imaging of the head and cervical spine was performed following the standard protocol without intravenous contrast. Multiplanar CT image reconstructions of the cervical spine were also generated.  COMPARISON:  None  FINDINGS: CT HEAD FINDINGS  Sinuses/Soft tissues: No significant soft tissue swelling. Minimal motion degradation. No skull fracture. Clear paranasal sinuses and mastoid air cells.  Intracranial: Mild for age low density in the periventricular white matter likely related to small vessel disease. Expected cerebral atrophy. Suspect a remote lacunar infarct in the right basal  ganglia. No mass lesion, hemorrhage, hydrocephalus, acute infarct, intra-axial, or extra-axial fluid collection.  CT CERVICAL SPINE FINDINGS  Spinal visualization through the bottom of T2. Mild motion degradation. Prevertebral soft tissues are within normal limits. Carotid atherosclerosis, worse on the left. No apical pneumothorax. Patient is oblique in the scanner. Relative rotation of C1 relative to C2 is felt to be positional. Grossly maintained vertebral body height and alignment. Multilevel facet arthropathy.  IMPRESSION: 1.  No acute intracranial abnormality.  Minimal motion degradation. 2. Motion degraded  evaluation of the cervical spine. No gross fracture or subluxation identified. If symptoms warrant, repeat should be considered when patient is able to hold still.   Electronically Signed   By: Abigail Miyamoto M.D.   On: 08/04/2014 20:14   Dg Chest Port 1 View  08/04/2014   CLINICAL DATA:  Shortness of breath, fell Monday night due to weakness, fever, history CHF, hypertension, atrial fibrillation, mitral valve disease  EXAM: PORTABLE CHEST - 1 VIEW  COMPARISON:  Portable exam 1713 hours without priors for comparison.  FINDINGS: Enlargement of cardiac silhouette post MVR.  Calcified tortuous aorta.  Pulmonary vascularity normal.  Significant infiltrates identified RIGHT middle and RIGHT lower lobes, likely also RIGHT upper lobe.  LEFT lung grossly clear.  Questionable small RIGHT pleural effusion.  No LEFT pleural effusion or pneumothorax.  Bones demineralized.  IMPRESSION: Enlargement of cardiac silhouette with vascular congestion post MVR.  RIGHT lung infiltrates question pneumonia.   Electronically Signed   By: Lavonia Dana M.D.   On: 08/04/2014 17:38   EKG: Independently reviewed. normal sinus rhythm, nonspecific ST and T waves changes.  Assessment/Plan Principal Problem:   CAP (community acquired pneumonia) Active Problems:   H/O mechanical mitral valve replacement 2001   S/p nephrectomy    Coronary artery disease   Chronic atrial fibrillation   Hypothyroidism   Bullous pemphigoid   Chronic anticoagulation   Chronic right-sided heart failure   Subtherapeutic anticoagulation   1. CAP (community acquired pneumonia) The patient is presenting with complaints of generalized weakness with cough and shortness of breath. She is tachypneic with mild hypoxia. She has significant fever. Chest x-ray has been read as possible pneumonia. With this the patient will be treated as a civil community-acquired pneumonia with ceftriaxone and azithromycin. Follow cultures.  2. Fall. Chronic anticoagulation. Patient had a fall while she is on chronic warfarin anticoagulation, she has not been able to lift herself on her own and had difficulty and weakness progressing from there. We will get CT head and C-spine to rule out any acute abnormality. PTOT consultation. Most likely secondary to generalized weakness.  3. Chronic anticoagulation, mechanical mitral wall, heparin hypersensitivity. Subtherapeutic anticoagulation. Chronic A. fib.  The patient's INR is 1.66. Patient has a history of hyper-sensitivity with heparin. I discussed the case with on-call cardiology fellow as well as pharmacy for available option. No other anticoagulation has indication for mechanical mitral valve. Patient has never received Lovenox in the past. as per my discussion with pharmacy and cardiology, they recommended to provide the patient the option of fondaparinux as a potential bridging therapy. Patient's family was at bedside as well as patient decided to go with warfarin and aspirin without any other bridging therapeutic anticoagulation understanding the increased risk for mitral valvel thrombosis and subsequent complications. Patient will be closely monitored in stepdown unit. Pharmacy dosing warfarin dosing. Check INR. Continue digoxin.  4. fall, elevated CPK, nontraumatic rhabdomyolysis. Right-sided heart  failure  Patient's serum creatinine stable. Gentle IV hydration overnight. Monitor ins and outs and decide about continuing hydration in the morning since the patient has history of right-sided heart failure.  5.Hypothyroidism. Continue Synthroid.   Advance goals of care discussion: DNR/DNI as per my discussion with patient and family.   Consults: Phone consultation with cardiology   DVT Prophylaxis:  mechanical compression device And on chronic therapeutic anticoagulation. Nutrition: Cardiac diet  Family Communication: family was present at bedside, opportunity was given to ask question and all questions were answered satisfactorily at the time of  interview. Disposition: Admitted as inpatient, step-down unit.  Author: Berle Mull, MD [facility] Pager: [phone] 08/04/2014  If 7PM-7AM, please contact night-coverage [email or web address] Password [email or web address]

## 2014-08-04 NOTE — ED Notes (Signed)
Bed: WA21 Expected date:  Expected time:  Means of arrival:  Comments: EMS- 81yo M, abdominal pain, IV established

## 2014-08-05 DIAGNOSIS — Z7901 Long term (current) use of anticoagulants: Secondary | ICD-10-CM

## 2014-08-05 DIAGNOSIS — I482 Chronic atrial fibrillation: Secondary | ICD-10-CM

## 2014-08-05 DIAGNOSIS — I2510 Atherosclerotic heart disease of native coronary artery without angina pectoris: Secondary | ICD-10-CM

## 2014-08-05 DIAGNOSIS — Z954 Presence of other heart-valve replacement: Secondary | ICD-10-CM

## 2014-08-05 DIAGNOSIS — E039 Hypothyroidism, unspecified: Secondary | ICD-10-CM

## 2014-08-05 DIAGNOSIS — I509 Heart failure, unspecified: Secondary | ICD-10-CM

## 2014-08-05 DIAGNOSIS — J189 Pneumonia, unspecified organism: Secondary | ICD-10-CM

## 2014-08-05 LAB — LEGIONELLA ANTIGEN, URINE

## 2014-08-05 LAB — CBC WITH DIFFERENTIAL/PLATELET
Basophils Absolute: 0 10*3/uL (ref 0.0–0.1)
Basophils Relative: 0 % (ref 0–1)
EOS PCT: 0 % (ref 0–5)
Eosinophils Absolute: 0 10*3/uL (ref 0.0–0.7)
HEMATOCRIT: 35.7 % — AB (ref 36.0–46.0)
Hemoglobin: 11.9 g/dL — ABNORMAL LOW (ref 12.0–15.0)
LYMPHS ABS: 0.7 10*3/uL (ref 0.7–4.0)
Lymphocytes Relative: 13 % (ref 12–46)
MCH: 30.6 pg (ref 26.0–34.0)
MCHC: 33.3 g/dL (ref 30.0–36.0)
MCV: 91.8 fL (ref 78.0–100.0)
MONOS PCT: 20 % — AB (ref 3–12)
Monocytes Absolute: 1.1 10*3/uL — ABNORMAL HIGH (ref 0.1–1.0)
NEUTROS ABS: 3.5 10*3/uL (ref 1.7–7.7)
Neutrophils Relative %: 67 % (ref 43–77)
Platelets: 109 10*3/uL — ABNORMAL LOW (ref 150–400)
RBC: 3.89 MIL/uL (ref 3.87–5.11)
RDW: 14.2 % (ref 11.5–15.5)
WBC: 5.3 10*3/uL (ref 4.0–10.5)

## 2014-08-05 LAB — COMPREHENSIVE METABOLIC PANEL
ALBUMIN: 2.9 g/dL — AB (ref 3.5–5.2)
ALK PHOS: 91 U/L (ref 39–117)
ALT: 42 U/L — ABNORMAL HIGH (ref 0–35)
AST: 107 U/L — ABNORMAL HIGH (ref 0–37)
Anion gap: 6 (ref 5–15)
BILIRUBIN TOTAL: 1.2 mg/dL (ref 0.3–1.2)
BUN: 23 mg/dL (ref 6–23)
CO2: 26 mmol/L (ref 19–32)
Calcium: 7.6 mg/dL — ABNORMAL LOW (ref 8.4–10.5)
Chloride: 106 mmol/L (ref 96–112)
Creatinine, Ser: 0.84 mg/dL (ref 0.50–1.10)
GFR, EST AFRICAN AMERICAN: 69 mL/min — AB (ref >90)
GFR, EST NON AFRICAN AMERICAN: 60 mL/min — AB (ref >90)
Glucose, Bld: 109 mg/dL — ABNORMAL HIGH (ref 70–99)
POTASSIUM: 3.7 mmol/L (ref 3.5–5.1)
SODIUM: 138 mmol/L (ref 135–145)
Total Protein: 5.3 g/dL — ABNORMAL LOW (ref 6.0–8.3)

## 2014-08-05 LAB — URINE CULTURE
Colony Count: NO GROWTH
Culture: NO GROWTH

## 2014-08-05 LAB — PROTIME-INR
INR: 1.72 — ABNORMAL HIGH (ref 0.00–1.49)
Prothrombin Time: 20.3 seconds — ABNORMAL HIGH (ref 11.6–15.2)

## 2014-08-05 LAB — STREP PNEUMONIAE URINARY ANTIGEN: STREP PNEUMO URINARY ANTIGEN: NEGATIVE

## 2014-08-05 LAB — MRSA PCR SCREENING: MRSA by PCR: NEGATIVE

## 2014-08-05 LAB — HIV ANTIBODY (ROUTINE TESTING W REFLEX): HIV Screen 4th Generation wRfx: NONREACTIVE

## 2014-08-05 MED ORDER — LISINOPRIL 10 MG PO TABS
10.0000 mg | ORAL_TABLET | Freq: Every day | ORAL | Status: DC
  Administered 2014-08-05 – 2014-08-11 (×7): 10 mg via ORAL
  Filled 2014-08-05 (×7): qty 1

## 2014-08-05 MED ORDER — GUAIFENESIN-DM 100-10 MG/5ML PO SYRP
5.0000 mL | ORAL_SOLUTION | ORAL | Status: DC | PRN
  Administered 2014-08-08 – 2014-08-09 (×2): 5 mL via ORAL
  Filled 2014-08-05 (×3): qty 10

## 2014-08-05 MED ORDER — WARFARIN SODIUM 5 MG PO TABS
7.5000 mg | ORAL_TABLET | Freq: Once | ORAL | Status: AC
  Administered 2014-08-05: 7.5 mg via ORAL
  Filled 2014-08-05: qty 1.5

## 2014-08-05 MED ORDER — POTASSIUM CHLORIDE CRYS ER 20 MEQ PO TBCR
40.0000 meq | EXTENDED_RELEASE_TABLET | Freq: Once | ORAL | Status: AC
Start: 2014-08-05 — End: 2014-08-05
  Administered 2014-08-05: 40 meq via ORAL
  Filled 2014-08-05: qty 2

## 2014-08-05 MED ORDER — IPRATROPIUM-ALBUTEROL 0.5-2.5 (3) MG/3ML IN SOLN
3.0000 mL | RESPIRATORY_TRACT | Status: DC | PRN
  Administered 2014-08-05 – 2014-08-08 (×6): 3 mL via RESPIRATORY_TRACT
  Filled 2014-08-05 (×6): qty 3

## 2014-08-05 MED ORDER — CETYLPYRIDINIUM CHLORIDE 0.05 % MT LIQD
7.0000 mL | Freq: Two times a day (BID) | OROMUCOSAL | Status: DC
  Administered 2014-08-05 – 2014-08-11 (×13): 7 mL via OROMUCOSAL

## 2014-08-05 MED ORDER — GUAIFENESIN ER 600 MG PO TB12
1200.0000 mg | ORAL_TABLET | Freq: Two times a day (BID) | ORAL | Status: DC
  Administered 2014-08-05 – 2014-08-11 (×13): 1200 mg via ORAL
  Filled 2014-08-05 (×14): qty 2

## 2014-08-05 MED ORDER — ALPRAZOLAM 0.25 MG PO TABS
0.2500 mg | ORAL_TABLET | Freq: Three times a day (TID) | ORAL | Status: DC | PRN
  Administered 2014-08-06 – 2014-08-07 (×5): 0.25 mg via ORAL
  Filled 2014-08-05 (×5): qty 1

## 2014-08-05 NOTE — Progress Notes (Signed)
While turning pt to change bedding, pt became acutely SOB and developed increased WOB/stridorous breath sounds. Placed pt back in upright position and PRN breathing treatment given per RT; breathing still labored but improved. MD [facility] paged. Orders received for PRN Xanax for anxiety, but did not give as pt is now resting comfortably in bed with normal breath sounds and WOB. Will continue to monitor.   Dorrene German, RN

## 2014-08-05 NOTE — Progress Notes (Signed)
ANTICOAGULATION CONSULT NOTE - FOLLOW UP  Pharmacy Consult for Warfarin Indication: MVR and Afib  Allergies  Allergen Reactions  . Heparin Anaphylaxis    Patient Measurements: Height: 5' 4.5" (163.8 cm) Weight: 150 lb 2.1 oz (68.1 kg) IBW/kg (Calculated) : 55.85  Vital Signs: Temp: 98.3 F (36.8 C) (04/13 0800) Temp Source: Oral (04/13 0800) BP: 174/95 mmHg (04/13 0800) Pulse Rate: 86 (04/13 0800)   Labs:  Recent Labs  08/04/14 1658 08/05/14 0330  HGB 14.1 11.9*  HCT 40.6 35.7*  PLT 126* 109*  LABPROT 19.8* 20.3*  INR 1.66* 1.72*  CREATININE 0.92 0.84  CKTOTAL 2709*  --     Estimated Creatinine Clearance: 43.6 mL/min (by C-G formula based on Cr of 0.84).   Medical History: Past Medical History  Diagnosis Date  . H/O mitral valve replacement 2001    mechanical heart valve; family states indication may have been MR from MVP or  rheumatic heart disease  . Thyroid disease   . Hypertension   . CHF (congestive heart failure)   . S/p nephrectomy     left  . Coronary artery disease   . 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 Urinary incontinence   . Atrial fibrillation   . DVT (deep venous thrombosis)     possible in 1990s and does not remember being on blood thinner  . Hypothyroidism   . Chronic anemia     heavy periods  . Glaucoma   . Bullous pemphigoid   . Chronic anticoagulation   . Basal cell carcinoma   . Allergic rhinitis   . Bilateral lower extremity edema     history of venous insufficiency  . Shortness of breath dyspnea      Assessment: 89 YOF on chronic warfarin PTA for history of mechanical MVR (2001) and atrial fibrillation.  Present presented 08/04/14 following fall on Monday night due to weakness, shortness of breath, and fever. Code sepsis was called. Pharmacy consulted to manage warfarin therapy while inpatient. Home warfarin dose reported as 5 mg daily except takes 2.5 mg dose on Mondays, Tuesdays, and Thursdays. Last dose  reported as past week.    MD discussed with patient's family on 08/04/14 the options of bridging with fondaparinux versus continuing with no bridging agent.  Family chose to continue with warfarin therapy without bridging agent.  Today, 08/05/14 Patient's INR is SUBtherapeutic at 1.72. H/H low, Hbg 11.9 (decreased) and PLTs low/decreased at 109.  NOTE: antibiotics may interact with warfarin and cause elevated INR  Goal of Therapy:  INR 2.5-3.5   Plan:  -Increase warfarin to 7.5mg  once tonight (1.5 x home dose) -Follow INR in AM -Monitor for signs and symptoms of bleeding  Gloriajean Dell, PharmD Candidate

## 2014-08-05 NOTE — Progress Notes (Signed)
[facility] PROGRESS NOTE   Amanda Richardson JFH:[id number] DOB: 04-[name]-1924 DOA: 08/04/2014 PCP: Merrilee Seashore, MD  HPI/Subjective: Feels okay, no fever overnight. Has some cough and sputum production. Denies any pain, discussed CT scan findings with the patient.  Assessment/Plan: Principal Problem:   CAP (community acquired pneumonia) Active Problems:   H/O mechanical mitral valve replacement 2001   S/p nephrectomy   Coronary artery disease   Chronic atrial fibrillation   Hypothyroidism   Bullous pemphigoid   Chronic anticoagulation   Chronic right-sided heart failure   Subtherapeutic anticoagulation    Sepsis Present on admission, temperature of 104.9 and respiratory rate 28 in the presence of pneumonia. This is likely secondary to pneumonia. Was started on broad-spectrum antibiotics and IV fluids. Sputum and blood cultures pending.  CAP Community-acquired pneumonia, right lung infiltrates per CXR. Patient started on vancomycin, ceftriaxone and azithromycin. Continue current antibiotics, likely to de-escalate antibiotics if patient defervesces. Continue current supportive management with bronchodilators, mucolytics, antitussives and oxygen.  Fall Likely secondary to generalized weakness from pneumonia, no loss of consciousness. CT scan of the head/C-spine did not show any evidence of fractures of bleeding. Telemetry to rule out tachyarrhythmias.  Chronic anticoagulation Patient is on warfarin for mechanical mitral valve, INR is 1.66. Per admitting physician patient and family declined use of fondaparinux as bridge, elected to go along with warfarin and ASA. Pharmacy to dose Coumadin  Hypothyroidism Continue Synthroid.  History of right-sided heart failure Continue home medications, continue digoxin and atenolol. Wean off of IV fluids.   Code Status: DNR Family Communication: Plan discussed with the patient. Disposition Plan: Remains  inpatient Diet: Diet 2 gram sodium Room service appropriate?: Yes; Fluid consistency:: Thin  Consultants:  None  Procedures:  None  Antibiotics:  Vancomycin, Rocephin, azithromycin.   Objective: Filed Vitals:   08/05/14 0400  BP: 165/71  Pulse: 80  Temp: 98.7 F (37.1 C)  Resp: 25    Intake/Output Summary (Last 24 hours) at 08/05/14 0724 Last data filed at 08/05/14 0700  Gross per 24 hour  Intake 841.25 ml  Output    425 ml  Net 416.25 ml   Filed Weights   08/04/14 1649 08/04/14 2149  Weight: 61.236 kg (135 lb) 68.1 kg (150 lb 2.1 oz)    Exam: General: Alert and awake, oriented x3, not in any acute distress. HEENT: anicteric sclera, pupils reactive to light and accommodation, EOMI CVS: S1-S2 clear, no murmur rubs or gallops Chest: clear to auscultation bilaterally, no wheezing, rales or rhonchi Abdomen: soft nontender, nondistended, normal bowel sounds, no organomegaly Extremities: no cyanosis, clubbing or edema noted bilaterally Neuro: Cranial nerves II-XII intact, no focal neurological deficits  Data Reviewed: Basic Metabolic Panel:  Recent Labs Lab 08/04/14 1658 08/05/14 0330  NA 134* 138  K 4.0 3.7  CL 102 106  CO2 24 26  GLUCOSE 127* 109*  BUN 25* 23  CREATININE 0.92 0.84  CALCIUM 8.3* 7.6*   Liver Function Tests:  Recent Labs Lab 08/04/14 1658 08/05/14 0330  AST 118* 107*  ALT 40* 42*  ALKPHOS 90 91  BILITOT 2.1* 1.2  PROT 6.5 5.3*  ALBUMIN 3.5 2.9*   No results for input(s): LIPASE, AMYLASE in the last 168 hours. No results for input(s): AMMONIA in the last 168 hours. CBC:  Recent Labs Lab 08/04/14 1658 08/05/14 0330  WBC 8.4 5.3  NEUTROABS 7.0 3.5  HGB 14.1 11.9*  HCT 40.6 35.7*  MCV 89.6 91.8  PLT 126* 109*   Cardiac Enzymes:  Recent Labs Lab 08/04/14 1658  CKTOTAL 2709*   BNP (last 3 results) No results for input(s): BNP in the last 8760 hours.  ProBNP (last 3 results) No results for input(s): PROBNP in  the last 8760 hours.  CBG: No results for input(s): GLUCAP in the last 168 hours.  Micro Recent Results (from the past 240 hour(s))  MRSA PCR Screening     Status: None   Collection Time: 08/04/14  4:08 PM  Result Value Ref Range Status   MRSA by PCR NEGATIVE NEGATIVE Final    Comment:        The GeneXpert MRSA Assay (FDA approved for NASAL specimens only), is one component of a comprehensive MRSA colonization surveillance program. It is not intended to diagnose MRSA infection nor to guide or monitor treatment for MRSA infections.      Studies: Ct Head Wo Contrast  08/04/2014   CLINICAL DATA:  [place] secondary weakness. Headache. On blood thinners.  EXAM: CT HEAD WITHOUT CONTRAST  CT CERVICAL SPINE WITHOUT CONTRAST  TECHNIQUE: Multidetector CT imaging of the head and cervical spine was performed following the standard protocol without intravenous contrast. Multiplanar CT image reconstructions of the cervical spine were also generated.  COMPARISON:  None  FINDINGS: CT HEAD FINDINGS  Sinuses/Soft tissues: No significant soft tissue swelling. Minimal motion degradation. No skull fracture. Clear paranasal sinuses and mastoid air cells.  Intracranial: Mild for age low density in the periventricular white matter likely related to small vessel disease. Expected cerebral atrophy. Suspect a remote lacunar infarct in the right basal ganglia. No mass lesion, hemorrhage, hydrocephalus, acute infarct, intra-axial, or extra-axial fluid collection.  CT CERVICAL SPINE FINDINGS  Spinal visualization through the bottom of T2. Mild motion degradation. Prevertebral soft tissues are within normal limits. Carotid atherosclerosis, worse on the left. No apical pneumothorax. Patient is oblique in the scanner. Relative rotation of C1 relative to C2 is felt to be positional. Grossly maintained vertebral body height and alignment. Multilevel facet arthropathy.  IMPRESSION: 1.  No acute intracranial abnormality.   Minimal motion degradation. 2. Motion degraded evaluation of the cervical spine. No gross fracture or subluxation identified. If symptoms warrant, repeat should be considered when patient is able to hold still.   Electronically Signed   By: Abigail Miyamoto M.D.   On: 08/04/2014 20:14   Ct Cervical Spine Wo Contrast  08/04/2014   CLINICAL DATA:  [place] Monday secondary weakness. Headache. On blood thinners.  EXAM: CT HEAD WITHOUT CONTRAST  CT CERVICAL SPINE WITHOUT CONTRAST  TECHNIQUE: Multidetector CT imaging of the head and cervical spine was performed following the standard protocol without intravenous contrast. Multiplanar CT image reconstructions of the cervical spine were also generated.  COMPARISON:  None  FINDINGS: CT HEAD FINDINGS  Sinuses/Soft tissues: No significant soft tissue swelling. Minimal motion degradation. No skull fracture. Clear paranasal sinuses and mastoid air cells.  Intracranial: Mild for age low density in the periventricular white matter likely related to small vessel disease. Expected cerebral atrophy. Suspect a remote lacunar infarct in the right basal ganglia. No mass lesion, hemorrhage, hydrocephalus, acute infarct, intra-axial, or extra-axial fluid collection.  CT CERVICAL SPINE FINDINGS  Spinal visualization through the bottom of T2. Mild motion degradation. Prevertebral soft tissues are within normal limits. Carotid atherosclerosis, worse on the left. No apical pneumothorax. Patient is oblique in the scanner. Relative rotation of C1 relative to C2 is felt to be positional. Grossly maintained vertebral body height and alignment. Multilevel facet arthropathy.  IMPRESSION: 1.  No acute intracranial abnormality.  Minimal motion degradation. 2. Motion degraded evaluation of the cervical spine. No gross fracture or subluxation identified. If symptoms warrant, repeat should be considered when patient is able to hold still.   Electronically Signed   By: Abigail Miyamoto M.D.   On: 08/04/2014  20:14   Dg Chest Port 1 View  08/04/2014   CLINICAL DATA:  Shortness of breath, fell Monday night due to weakness, fever, history CHF, hypertension, atrial fibrillation, mitral valve disease  EXAM: PORTABLE CHEST - 1 VIEW  COMPARISON:  Portable exam 1713 hours without priors for comparison.  FINDINGS: Enlargement of cardiac silhouette post MVR.  Calcified tortuous aorta.  Pulmonary vascularity normal.  Significant infiltrates identified RIGHT middle and RIGHT lower lobes, likely also RIGHT upper lobe.  LEFT lung grossly clear.  Questionable small RIGHT pleural effusion.  No LEFT pleural effusion or pneumothorax.  Bones demineralized.  IMPRESSION: Enlargement of cardiac silhouette with vascular congestion post MVR.  RIGHT lung infiltrates question pneumonia.   Electronically Signed   By: Lavonia Dana M.D.   On: 08/04/2014 17:38    Scheduled Meds: . aspirin EC  325 mg Oral Daily  . atenolol  50 mg Oral Daily  . azithromycin  500 mg Intravenous Q24H  . cefTRIAXone (ROCEPHIN)  IV  1 g Intravenous Q24H  . digoxin  0.0625 mg Oral Daily  . latanoprost  1 drop Both Eyes QHS  . levothyroxine  50 mcg Oral QAC breakfast  . vancomycin  500 mg Intravenous Q12H  . Warfarin - Pharmacist Dosing Inpatient   Does not apply q1800   Continuous Infusions: . sodium chloride 75 mL/hr at 08/04/14 2107       Time spent: 35 minutes    [facility] A  [facility] Pager [phone] If 7PM-7AM, please contact night-coverage at [email or web address], password [facility] 08/05/2014, 7:24 AM  LOS: 1 day

## 2014-08-05 NOTE — Progress Notes (Signed)
Pt resting comfortably in bed. Breathing pattern regular and unlabored. No further episodes of shortness of breath/incresed WOB this shift.   Dorrene German, RN

## 2014-08-05 NOTE — Progress Notes (Signed)
CARE MANAGEMENT NOTE 08/05/2014  Patient:  Amanda Richardson, Amanda Richardson   Account Number:  [id number]  Date Initiated:  08/05/2014  Documentation initiated by:  DAVIS,RHONDA  Subjective/Objective Assessment:   pt with confirmed pna after fall and hypotension on Monday at home     Action/Plan:   home when stable   Anticipated DC Date:  08/08/2014   Anticipated DC Plan:  HOME/SELF CARE  In-house referral  NA      DC Planning Services  CM consult      PAC Choice  NA   Choice offered to / List presented to:  NA           Status of service:  In process, will continue to follow Medicare Important Message given?   (If response is "NO", the following Medicare IM given date fields will be blank) Date Medicare IM given:   Medicare IM given by:   Date Additional Medicare IM given:   Additional Medicare IM given by:    Discharge Disposition:    Per UR Regulation:  Reviewed for med. necessity/level of care/duration of stay  If discussed at [place] of Stay Meetings, dates discussed:    Comments:  August 05, 2014/Rhonda L. Rosana Hoes, RN, BSN, CCM. Case Management [place] [phone] No discharge needs present of time of review.

## 2014-08-06 DIAGNOSIS — Z5181 Encounter for therapeutic drug level monitoring: Secondary | ICD-10-CM

## 2014-08-06 DIAGNOSIS — L120 Bullous pemphigoid: Secondary | ICD-10-CM

## 2014-08-06 DIAGNOSIS — Z905 Acquired absence of kidney: Secondary | ICD-10-CM

## 2014-08-06 LAB — BASIC METABOLIC PANEL
Anion gap: 5 (ref 5–15)
BUN: 24 mg/dL — ABNORMAL HIGH (ref 6–23)
CO2: 23 mmol/L (ref 19–32)
Calcium: 7.7 mg/dL — ABNORMAL LOW (ref 8.4–10.5)
Chloride: 108 mmol/L (ref 96–112)
Creatinine, Ser: 0.82 mg/dL (ref 0.50–1.10)
GFR calc non Af Amer: 62 mL/min — ABNORMAL LOW (ref >90)
GFR, EST AFRICAN AMERICAN: 71 mL/min — AB (ref >90)
Glucose, Bld: 101 mg/dL — ABNORMAL HIGH (ref 70–99)
Potassium: 3.9 mmol/L (ref 3.5–5.1)
SODIUM: 136 mmol/L (ref 135–145)

## 2014-08-06 LAB — PROTIME-INR
INR: 2.96 — ABNORMAL HIGH (ref 0.00–1.49)
Prothrombin Time: 31.1 seconds — ABNORMAL HIGH (ref 11.6–15.2)

## 2014-08-06 LAB — VANCOMYCIN, TROUGH: Vancomycin Tr: 9.6 ug/mL — ABNORMAL LOW (ref 10.0–20.0)

## 2014-08-06 MED ORDER — WARFARIN 0.5 MG HALF TABLET
0.5000 mg | ORAL_TABLET | Freq: Once | ORAL | Status: AC
  Administered 2014-08-06: 0.5 mg via ORAL
  Filled 2014-08-06 (×2): qty 1

## 2014-08-06 MED ORDER — BUDESONIDE 0.25 MG/2ML IN SUSP
0.2500 mg | Freq: Once | RESPIRATORY_TRACT | Status: DC
  Filled 2014-08-06: qty 2

## 2014-08-06 MED ORDER — VANCOMYCIN HCL IN DEXTROSE 750-5 MG/150ML-% IV SOLN
750.0000 mg | Freq: Two times a day (BID) | INTRAVENOUS | Status: DC
  Filled 2014-08-06: qty 150

## 2014-08-06 NOTE — Progress Notes (Addendum)
ANTIBIOTIC CONSULT NOTE - Follow Up  Pharmacy Consult for Vancomycin Indication: pneumonia  Allergies  Allergen Reactions  . Heparin Anaphylaxis    Patient Measurements: Height: 5' 4.5" (163.8 cm) Weight: 150 lb 2.1 oz (68.1 kg) IBW/kg (Calculated) : 55.85  Vital Signs: Temp: 97.9 F (36.6 C) (04/14 0708) Temp Source: Oral (04/14 0708) BP: 179/94 mmHg (04/14 1000) Pulse Rate: 76 (04/14 1000) Intake/Output from previous day: 04/13 0701 - 04/14 0700 In: 2765 [P.O.:1200; I.V.:1065; IV Piggyback:500] Out: -  Intake/Output from this shift: Total I/O In: 500 [P.O.:150; I.V.:250; IV Piggyback:100] Out: -   Labs:  Recent Labs  08/04/14 1658 08/05/14 0330 08/06/14 0339  WBC 8.4 5.3  --   HGB 14.1 11.9*  --   PLT 126* 109*  --   CREATININE 0.92 0.84 0.82   Estimated Creatinine Clearance: 44.6 mL/min (by C-G formula based on Cr of 0.82).  Recent Labs  08/06/14 0927  VANCOTROUGH 9.6*     Assessment: 63 yoF admitted with shortness of breath, weakness, fever.  Patient's daughter reports dark urine.  CXR in ED questions pneumonia.  Code sepsis called and patient admitted.  Azithromycin and Ceftriaxone given in ED.  Pharmacy consulted to dose vancomycin in addition to Azith/CTX for sepsis secondary to CAP, ICU admission.  4/12 >> Azithromycin >> 4/12 >> Ceftriaxone >> 4/12 >> Vancomycin >>  Tmax: afebrile x 24 hours (afebrile since date of admission) WBC: WNL Renal: SCr 0.82, CrCl~49 ml/min (CG) Vancomycin trough subtherapeutic  4/12 urine cx: no growth 4/12 blood x 2 cx: ngtd  Today is day #3 Vancomycin 500 mg IV q12h, Azithromycin 500 mg IV q24h, Ceftriaxone 1g IV q24h.  Goal of Therapy:  Vancomycin trough level 15-20 mcg/ml Doses adjusted per renal function Eradication of infection  Plan:  1.  Adjust vancomycin to 750 mg IV q12h. 2.  F/u SCr, trough levels, de-escalation of antibiotics.   Hershal Coria 08/06/2014,11:12 AM

## 2014-08-06 NOTE — Progress Notes (Signed)
Report called to Myrna, RN on [facility], pt to be transferred to 1416. Family is present at bedside, two daughters. Pt denies any complaints or concerns at this time.

## 2014-08-06 NOTE — Progress Notes (Signed)
[facility] PROGRESS NOTE   Amanda Richardson KDT:[id number] DOB: Aug 24, 1923 DOA: 08/04/2014 PCP: Merrilee Seashore, MD  HPI/Subjective: Denies fever and chills, had an episode of shortness of breath with wheezing yesterday. Feels okay, less SOB and cough. I will transfer to telemetry bed.  Assessment/Plan: Principal Problem:   CAP (community acquired pneumonia) Active Problems:   H/O mechanical mitral valve replacement 2001   S/p nephrectomy   Coronary artery disease   Chronic atrial fibrillation   Hypothyroidism   Bullous pemphigoid   Chronic anticoagulation   Chronic right-sided heart failure   Subtherapeutic anticoagulation    Sepsis Present on admission, temperature of 104.9 and respiratory rate 28 in the presence of pneumonia. This is likely secondary to pneumonia. Was started on broad-spectrum antibiotics and IV fluids. Sputum and blood cultures pending.  CAP Community-acquired pneumonia, right lung infiltrates per CXR. Patient started on vancomycin, ceftriaxone and azithromycin. Continue current antibiotics, DC vancomycin as patient defervesces. Continue current supportive management with bronchodilators, mucolytics, antitussives and oxygen. Still has some respiratory distress was respiratory rate ranging from 20-33.  Fall Likely secondary to generalized weakness from pneumonia, no loss of consciousness. CT scan of the head/C-spine did not show any evidence of fractures of bleeding. Telemetry did not show any tachyarrhythmias.  Chronic anticoagulation Patient is on warfarin for mechanical mitral valve, INR is 1.66. Per admitting physician patient and family declined use of fondaparinux as bridge, elected to go along with warfarin and ASA. Pharmacy to dose Coumadin, INR is 2.9 today.  Hypothyroidism Continue Synthroid.  History of right-sided heart failure Continue home medications, continue digoxin and atenolol.  Discontinue IV fluids, Lasix as  needed.   Code Status: DNR Family Communication: Plan discussed with the patient. Disposition Plan: Remains inpatient Diet: Diet 2 gram sodium Room service appropriate?: Yes; Fluid consistency:: Thin  Consultants:  None  Procedures:  None  Antibiotics:  Vancomycin, Rocephin, azithromycin.   Objective: Filed Vitals:   08/06/14 1200  BP:   Pulse:   Temp: 97.9 F (36.6 C)  Resp:     Intake/Output Summary (Last 24 hours) at 08/06/14 1225 Last data filed at 08/06/14 1042  Gross per 24 hour  Intake   2060 ml  Output      0 ml  Net   2060 ml   Filed Weights   08/04/14 1649 08/04/14 2149  Weight: 61.236 kg (135 lb) 68.1 kg (150 lb 2.1 oz)    Exam: General: Alert and awake, oriented x3, not in any acute distress. HEENT: anicteric sclera, pupils reactive to light and accommodation, EOMI CVS: S1-S2 clear, no murmur rubs or gallops Chest: clear to auscultation bilaterally, no wheezing, rales or rhonchi Abdomen: soft nontender, nondistended, normal bowel sounds, no organomegaly Extremities: no cyanosis, clubbing or edema noted bilaterally Neuro: Cranial nerves II-XII intact, no focal neurological deficits  Data Reviewed: Basic Metabolic Panel:  Recent Labs Lab 08/04/14 1658 08/05/14 0330 08/06/14 0339  NA 134* 138 136  K 4.0 3.7 3.9  CL 102 106 108  CO2 24 26 23   GLUCOSE 127* 109* 101*  BUN 25* 23 24*  CREATININE 0.92 0.84 0.82  CALCIUM 8.3* 7.6* 7.7*   Liver Function Tests:  Recent Labs Lab 08/04/14 1658 08/05/14 0330  AST 118* 107*  ALT 40* 42*  ALKPHOS 90 91  BILITOT 2.1* 1.2  PROT 6.5 5.3*  ALBUMIN 3.5 2.9*   No results for input(s): LIPASE, AMYLASE in the last 168 hours. No results for input(s): AMMONIA in the last 168 hours. CBC:  Recent Labs Lab 08/04/14 1658 08/05/14 0330  WBC 8.4 5.3  NEUTROABS 7.0 3.5  HGB 14.1 11.9*  HCT 40.6 35.7*  MCV 89.6 91.8  PLT 126* 109*   Cardiac Enzymes:  Recent Labs Lab 08/04/14 1658    CKTOTAL 2709*   BNP (last 3 results) No results for input(s): BNP in the last 8760 hours.  ProBNP (last 3 results) No results for input(s): PROBNP in the last 8760 hours.  CBG: No results for input(s): GLUCAP in the last 168 hours.  Micro Recent Results (from the past 240 hour(s))  Urine culture     Status: None   Collection Time: 08/04/14  4:08 PM  Result Value Ref Range Status   Specimen Description URINE, CATHETERIZED  Final   Special Requests NONE  Final   Colony Count NO GROWTH Performed at [facility]   Final   Culture NO GROWTH Performed at [facility]   Final   Report Status 08/05/2014 FINAL  Final  MRSA PCR Screening     Status: None   Collection Time: 08/04/14  4:08 PM  Result Value Ref Range Status   MRSA by PCR NEGATIVE NEGATIVE Final    Comment:        The GeneXpert MRSA Assay (FDA approved for NASAL specimens only), is one component of a comprehensive MRSA colonization surveillance program. It is not intended to diagnose MRSA infection nor to guide or monitor treatment for MRSA infections.   Blood Culture (routine x 2)     Status: None (Preliminary result)   Collection Time: 08/04/14  4:53 PM  Result Value Ref Range Status   Specimen Description LEFT ANTECUBITAL  Final   Special Requests BOTTLES DRAWN AEROBIC AND ANAEROBIC 5CC  Final   Culture   Final           BLOOD CULTURE RECEIVED NO GROWTH TO DATE CULTURE WILL BE HELD FOR 5 DAYS BEFORE ISSUING A FINAL NEGATIVE REPORT Performed at [facility]    Report Status PENDING  Incomplete  Blood Culture (routine x 2)     Status: None (Preliminary result)   Collection Time: 08/04/14  4:56 PM  Result Value Ref Range Status   Specimen Description BLOOD RIGHT FOREARM  Final   Special Requests BOTTLES DRAWN AEROBIC AND ANAEROBIC 5CC  Final   Culture   Final           BLOOD CULTURE RECEIVED NO GROWTH TO DATE CULTURE WILL BE HELD FOR 5 DAYS BEFORE ISSUING A FINAL NEGATIVE  REPORT Performed at [facility]    Report Status PENDING  Incomplete     Studies: Ct Head Wo Contrast  08/04/2014   CLINICAL DATA:  [place] secondary weakness. Headache. On blood thinners.  EXAM: CT HEAD WITHOUT CONTRAST  CT CERVICAL SPINE WITHOUT CONTRAST  TECHNIQUE: Multidetector CT imaging of the head and cervical spine was performed following the standard protocol without intravenous contrast. Multiplanar CT image reconstructions of the cervical spine were also generated.  COMPARISON:  None  FINDINGS: CT HEAD FINDINGS  Sinuses/Soft tissues: No significant soft tissue swelling. Minimal motion degradation. No skull fracture. Clear paranasal sinuses and mastoid air cells.  Intracranial: Mild for age low density in the periventricular white matter likely related to small vessel disease. Expected cerebral atrophy. Suspect a remote lacunar infarct in the right basal ganglia. No mass lesion, hemorrhage, hydrocephalus, acute infarct, intra-axial, or extra-axial fluid collection.  CT CERVICAL SPINE FINDINGS  Spinal visualization through the bottom of  T2. Mild motion degradation. Prevertebral soft tissues are within normal limits. Carotid atherosclerosis, worse on the left. No apical pneumothorax. Patient is oblique in the scanner. Relative rotation of C1 relative to C2 is felt to be positional. Grossly maintained vertebral body height and alignment. Multilevel facet arthropathy.  IMPRESSION: 1.  No acute intracranial abnormality.  Minimal motion degradation. 2. Motion degraded evaluation of the cervical spine. No gross fracture or subluxation identified. If symptoms warrant, repeat should be considered when patient is able to hold still.   Electronically Signed   By: Abigail Miyamoto M.D.   On: 08/04/2014 20:14   Ct Cervical Spine Wo Contrast  08/04/2014   CLINICAL DATA:  [place] secondary weakness. Headache. On blood thinners.  EXAM: CT HEAD WITHOUT CONTRAST  CT CERVICAL SPINE WITHOUT CONTRAST   TECHNIQUE: Multidetector CT imaging of the head and cervical spine was performed following the standard protocol without intravenous contrast. Multiplanar CT image reconstructions of the cervical spine were also generated.  COMPARISON:  None  FINDINGS: CT HEAD FINDINGS  Sinuses/Soft tissues: No significant soft tissue swelling. Minimal motion degradation. No skull fracture. Clear paranasal sinuses and mastoid air cells.  Intracranial: Mild for age low density in the periventricular white matter likely related to small vessel disease. Expected cerebral atrophy. Suspect a remote lacunar infarct in the right basal ganglia. No mass lesion, hemorrhage, hydrocephalus, acute infarct, intra-axial, or extra-axial fluid collection.  CT CERVICAL SPINE FINDINGS  Spinal visualization through the bottom of T2. Mild motion degradation. Prevertebral soft tissues are within normal limits. Carotid atherosclerosis, worse on the left. No apical pneumothorax. Patient is oblique in the scanner. Relative rotation of C1 relative to C2 is felt to be positional. Grossly maintained vertebral body height and alignment. Multilevel facet arthropathy.  IMPRESSION: 1.  No acute intracranial abnormality.  Minimal motion degradation. 2. Motion degraded evaluation of the cervical spine. No gross fracture or subluxation identified. If symptoms warrant, repeat should be considered when patient is able to hold still.   Electronically Signed   By: Abigail Miyamoto M.D.   On: 08/04/2014 20:14   Dg Chest Port 1 View  08/04/2014   CLINICAL DATA:  Shortness of breath, fell Monday night due to weakness, fever, history CHF, hypertension, atrial fibrillation, mitral valve disease  EXAM: PORTABLE CHEST - 1 VIEW  COMPARISON:  Portable exam 1713 hours without priors for comparison.  FINDINGS: Enlargement of cardiac silhouette post MVR.  Calcified tortuous aorta.  Pulmonary vascularity normal.  Significant infiltrates identified RIGHT middle and RIGHT lower lobes,  likely also RIGHT upper lobe.  LEFT lung grossly clear.  Questionable small RIGHT pleural effusion.  No LEFT pleural effusion or pneumothorax.  Bones demineralized.  IMPRESSION: Enlargement of cardiac silhouette with vascular congestion post MVR.  RIGHT lung infiltrates question pneumonia.   Electronically Signed   By: Lavonia Dana M.D.   On: 08/04/2014 17:38    Scheduled Meds: . antiseptic oral rinse  7 mL Mouth Rinse BID  . aspirin EC  325 mg Oral Daily  . atenolol  50 mg Oral Daily  . azithromycin  500 mg Intravenous Q24H  . cefTRIAXone (ROCEPHIN)  IV  1 g Intravenous Q24H  . digoxin  0.0625 mg Oral Daily  . guaiFENesin  1,200 mg Oral BID  . latanoprost  1 drop Both Eyes QHS  . levothyroxine  50 mcg Oral QAC breakfast  . lisinopril  10 mg Oral Daily  . vancomycin  750 mg Intravenous Q12H  . warfarin  0.5 mg Oral ONCE-1800  . Warfarin - Pharmacist Dosing Inpatient   Does not apply q1800   Continuous Infusions: . sodium chloride 50 mL/hr at 08/05/14 1352       Time spent: 35 minutes    [facility] A  [facility] Pager [phone] If 7PM-7AM, please contact night-coverage at [email or web address], password [facility] 08/06/2014, 12:25 PM  LOS: 2 days

## 2014-08-06 NOTE — Progress Notes (Signed)
ANTICOAGULATION CONSULT NOTE - FOLLOW UP  Pharmacy Consult for Warfarin Indication: MVR and Afib  Allergies  Allergen Reactions  . Heparin Anaphylaxis    Patient Measurements: Height: 5' 4.5" (163.8 cm) Weight: 150 lb 2.1 oz (68.1 kg) IBW/kg (Calculated) : 55.85  Vital Signs: Temp: 97.9 F (36.6 C) (04/14 0708) Temp Source: Oral (04/14 0708) BP: 163/72 mmHg (04/14 0400) Pulse Rate: 83 (04/14 0400)   Labs:  Recent Labs  08/04/14 1658 08/05/14 0330 08/06/14 0339  HGB 14.1 11.9*  --   HCT 40.6 35.7*  --   PLT 126* 109*  --   LABPROT 19.8* 20.3* 31.1*  INR 1.66* 1.72* 2.96*  CREATININE 0.92 0.84 0.82  CKTOTAL 2709*  --   --     Estimated Creatinine Clearance: 44.6 mL/min (by C-G formula based on Cr of 0.82).   Medical History: Past Medical History  Diagnosis Date  . H/O mitral valve replacement 2001    mechanical heart valve; family states indication may have been MR from MVP or  rheumatic heart disease  . Thyroid disease   . Hypertension   . CHF (congestive heart failure)   . S/p nephrectomy     left  . Coronary artery disease   . 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 Urinary incontinence   . Atrial fibrillation   . DVT (deep venous thrombosis)     possible in 1990s and does not remember being on blood thinner  . Hypothyroidism   . Chronic anemia     heavy periods  . Glaucoma   . Bullous pemphigoid   . Chronic anticoagulation   . Basal cell carcinoma   . Allergic rhinitis   . Bilateral lower extremity edema     history of venous insufficiency  . Shortness of breath dyspnea    Assessment: Amanda Richardson on chronic warfarin PTA for history of mechanical MVR (2001) and atrial fibrillation.  Present presented 08/04/14 following fall on Monday night due to weakness, shortness of breath, and fever. Code sepsis was called. Pharmacy consulted to manage warfarin therapy while inpatient. Home warfarin dose reported as 5 mg daily except takes 2.5 mg dose  on Mondays, Tuesdays, and Thursdays. Last dose reported as last week (a least two days PTA).   MD discussed with patient's family on 08/04/14 the options of bridging with fondaparinux versus continuing with no bridging agent.  Family chose to continue with warfarin therapy without bridging agent.  Today, 08/06/14 Patient's INR is therapeutic at 2.96; however, INR has significantly increased.  Patient likely more sensitive to warfarin due to acute illness, low PO intake, interacting antibiotics and will likely continue to rise significantly following 7.5 mg dose given yesterday.  CBC (4/13) H/H low, Hbg 11.9 (decreased) and PLTs low/decreased at 109.  Goal of Therapy:  INR 2.5-3.5   Plan:  -Warfarin 0.5mg  once tonight (since anticipating a significant bump in INR tomorrow from 7.5mg  dose given 08/05/14) -Follow INR in AM -Monitor for signs and symptoms of bleeding  Gloriajean Dell, PharmD Candidate    ADDENDUM: 08/06/2014 9:01 AM I agree with the above assessment and plan.  Hershal Coria, PharmD, BCPS Pager: [phone] 08/06/2014 9:01 AM

## 2014-08-07 ENCOUNTER — Inpatient Hospital Stay (HOSPITAL_COMMUNITY): Payer: Medicare Other

## 2014-08-07 LAB — CBC
HEMATOCRIT: 36.7 % (ref 36.0–46.0)
Hemoglobin: 12.4 g/dL (ref 12.0–15.0)
MCH: 30.5 pg (ref 26.0–34.0)
MCHC: 33.8 g/dL (ref 30.0–36.0)
MCV: 90.4 fL (ref 78.0–100.0)
Platelets: 140 10*3/uL — ABNORMAL LOW (ref 150–400)
RBC: 4.06 MIL/uL (ref 3.87–5.11)
RDW: 14.1 % (ref 11.5–15.5)
WBC: 6.9 10*3/uL (ref 4.0–10.5)

## 2014-08-07 LAB — COMPREHENSIVE METABOLIC PANEL
ALT: 47 U/L — ABNORMAL HIGH (ref 0–35)
AST: 65 U/L — ABNORMAL HIGH (ref 0–37)
Albumin: 2.8 g/dL — ABNORMAL LOW (ref 3.5–5.2)
Alkaline Phosphatase: 90 U/L (ref 39–117)
Anion gap: 5 (ref 5–15)
BILIRUBIN TOTAL: 0.9 mg/dL (ref 0.3–1.2)
BUN: 20 mg/dL (ref 6–23)
CHLORIDE: 105 mmol/L (ref 96–112)
CO2: 24 mmol/L (ref 19–32)
CREATININE: 0.74 mg/dL (ref 0.50–1.10)
Calcium: 7.8 mg/dL — ABNORMAL LOW (ref 8.4–10.5)
GFR, EST AFRICAN AMERICAN: 85 mL/min — AB (ref >90)
GFR, EST NON AFRICAN AMERICAN: 73 mL/min — AB (ref >90)
GLUCOSE: 108 mg/dL — AB (ref 70–99)
Potassium: 3.9 mmol/L (ref 3.5–5.1)
Sodium: 134 mmol/L — ABNORMAL LOW (ref 135–145)
Total Protein: 5.3 g/dL — ABNORMAL LOW (ref 6.0–8.3)

## 2014-08-07 LAB — PROTIME-INR
INR: 4.35 — AB (ref 0.00–1.49)
Prothrombin Time: 41.9 seconds — ABNORMAL HIGH (ref 11.6–15.2)

## 2014-08-07 MED ORDER — MORPHINE SULFATE 2 MG/ML IJ SOLN
1.0000 mg | INTRAMUSCULAR | Status: DC | PRN
  Administered 2014-08-07: 2 mg via INTRAVENOUS
  Filled 2014-08-07: qty 1

## 2014-08-07 MED ORDER — FUROSEMIDE 10 MG/ML IJ SOLN
40.0000 mg | Freq: Once | INTRAMUSCULAR | Status: AC
  Administered 2014-08-07: 40 mg via INTRAVENOUS
  Filled 2014-08-07: qty 4

## 2014-08-07 NOTE — Progress Notes (Signed)
ANTICOAGULATION CONSULT NOTE - FOLLOW UP  Pharmacy Consult for Warfarin Indication: MVR and Afib  Allergies  Allergen Reactions  . Heparin Anaphylaxis    Patient Measurements: Height: 5\' 4"  (162.6 cm) Weight: 157 lb 6.5 oz (71.4 kg) IBW/kg (Calculated) : 54.7  Vital Signs: Temp: 98.2 F (36.8 C) (04/15 0618) Temp Source: Oral (04/15 0618) BP: 158/84 mmHg (04/15 0618) Pulse Rate: 74 (04/15 0618)   Labs:  Recent Labs  08/04/14 1658 08/05/14 0330 08/06/14 0339 08/07/14 0432  HGB 14.1 11.9*  --  12.4  HCT 40.6 35.7*  --  36.7  PLT 126* 109*  --  140*  LABPROT 19.8* 20.3* 31.1* 41.9*  INR 1.66* 1.72* 2.96* 4.35*  CREATININE 0.92 0.84 0.82 0.74  CKTOTAL 2709*  --   --   --     Estimated Creatinine Clearance: 46.2 mL/min (by C-G formula based on Cr of 0.74).   Medical History: Past Medical History  Diagnosis Date  . H/O mitral valve replacement 2001    mechanical heart valve; family states indication may have been MR from MVP or  rheumatic heart disease  . Thyroid disease   . Hypertension   . CHF (congestive heart failure)   . S/p nephrectomy     left  . Coronary artery disease   . 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 Urinary incontinence   . Atrial fibrillation   . DVT (deep venous thrombosis)     possible in 1990s and does not remember being on blood thinner  . Hypothyroidism   . Chronic anemia     heavy periods  . Glaucoma   . Bullous pemphigoid   . Chronic anticoagulation   . Basal cell carcinoma   . Allergic rhinitis   . Bilateral lower extremity edema     history of venous insufficiency  . Shortness of breath dyspnea    Assessment: 89 YOF on chronic warfarin PTA for history of mechanical MVR (2001) and atrial fibrillation.  Present presented 08/04/14 following fall on Monday night due to weakness, shortness of breath, and fever. Code sepsis was called. Pharmacy consulted to manage warfarin therapy while inpatient. Home warfarin dose  reported as 5 mg daily except takes 2.5 mg dose on Mondays, Tuesdays, and Thursdays. Last dose reported as last week (a least two days PTA).  MD discussed with patient's family on 08/04/14 the options of bridging with fondaparinux versus continuing with no bridging agent.  Family chose to continue with warfarin therapy without bridging agent.  Today, 08/07/2014:  INR significantly increased to 4.35, supratherapeutic  Rn reports no bleeding or complications  Patient likely more sensitive to warfarin due to acute illness, low PO intake, interacting antibiotics.  CBC: Hgb 12.4, Plt 140.  Remains stable.  Drug-interactions: Aspirin 325mg , Ceftriaxone/Azithromycin  Goal of Therapy:  INR 2.5-3.5   Plan:   Hold warfarin tonight.  Daily INR  Monitor for signs and symptoms of bleeding  Gretta Arab PharmD, BCPS Pager [phone] 08/07/2014 12:11 PM

## 2014-08-07 NOTE — Progress Notes (Signed)
Patient SOB this morning but patient stated 'not as bad as I have been'.  Appears to have increased WOB and respirations in the 30s.  O2 sats 98% on 2L [place]. Called for breathing tx this AM and administered xanax with little change.  Patient also complaining of food 'stopping in my chest' and coughing while eating.  MD notified.  No new orders at this time.  Will continue to monitor.

## 2014-08-07 NOTE — Care Management Note (Signed)
    Page 1 of 1   08/07/2014     2:55:57 PM CARE MANAGEMENT NOTE 08/07/2014  Patient:  Amanda Richardson, Amanda Richardson   Account Number:  [id number]  Date Initiated:  08/05/2014  Documentation initiated by:  DAVIS,RHONDA  Subjective/Objective Assessment:   pt with confirmed pna after fall and hypotension on Monday at home     Action/Plan:   home when stable   Anticipated DC Date:  08/10/2014   Anticipated DC Plan:  [place]  In-house referral  NA      DC Planning Services  CM consult      [facility] Choice  NA   Choice offered to / List presented to:  NA           Status of service:  In process, will continue to follow Medicare Important Message given?  YES (If response is "NO", the following Medicare IM given date fields will be blank) Date Medicare IM given:  08/07/2014 Medicare IM given by:  [facility] Date Additional Medicare IM given:   Additional Medicare IM given by:    Discharge Disposition:    Per UR Regulation:  Reviewed for med. necessity/level of care/duration of stay  If discussed at [place] of Stay Meetings, dates discussed:    Comments:  08/07/14 Dessa Phi RN BSN NCM [phone] Pt ordered-await recommendations.O2, iv abx.  August 05, 2014/Rhonda L. Rosana Hoes, RN, BSN, CCM. Case Management [place] [phone] No discharge needs present of time of review.

## 2014-08-07 NOTE — Evaluation (Signed)
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    Clinical Impression  Pt admitted with above diagnosis. Pt currently with functional limitations due to the deficits listed below (see PT Problem List).  Pt will benefit from skilled PT to increase their independence and safety with mobility to allow discharge to the venue listed below.  Pt with increased WOB throughout session so limited to sitting EOB only (even though pt hoping to stand today as well).  RN into room during session and adjusted O2 to 4L.  Daughters present so discussed recommendation to SNF at this time due to WOB and likely needing assist for all tasks at home due to fatigue and breathing.  Will attempt to progress mobility safely while pt in acute care.     Follow Up Recommendations SNF;Supervision/Assistance - 24 hour    Equipment Recommendations  Wheelchair (measurements PT)    Recommendations for Other Services       Precautions / Restrictions Precautions Precautions: Fall Precaution Comments: monitor RR and SpO2      Mobility  Bed Mobility Overal bed mobility: Needs Assistance;+2 for physical assistance Bed Mobility: Supine to Sit;Sit to Supine     Supine to sit: Min assist Sit to supine: Mod assist;+2 for physical assistance   General bed mobility comments: more assist back to bed due to increased work of breathing, pt sat EOB for 5 minutes, SpO2 flucuating from 80-91% on 2L with RN into room and adjusted to 4L O2 [place], SpO2 99% on 4L O2 upon returning to supine  Transfers Overall transfer level:  (not  attempted due to WOB)                  Ambulation/Gait                Stairs            Wheelchair Mobility    Modified Rankin (Stroke Patients Only)       Balance Overall balance assessment: Needs assistance Sitting-balance support: Bilateral upper extremity supported Sitting balance-Leahy Scale: Poor Sitting balance - Comments: requires UE support                                     Pertinent Vitals/Pain Pain Assessment: No/denies pain    Home Living Family/patient expects to be discharged to:: Private residence Living Arrangements: Alone Available Help at Discharge: Family;Available PRN/intermittently Type of Home: House         Home Equipment: Walker - 2 wheels      Prior Function Level of Independence: Independent with assistive device(s)               Hand Dominance        Extremity/Trunk Assessment   Upper Extremity Assessment: Generalized weakness           Lower Extremity Assessment: Generalized weakness         Communication   Communication: No difficulties  Cognition Arousal/Alertness: Awake/alert Behavior During Therapy: WFL for tasks assessed/performed Overall Cognitive Status: Within Functional Limits for tasks assessed  General Comments      Exercises        Assessment/Plan    PT Assessment Patient needs continued PT services  PT Diagnosis Difficulty walking;Generalized weakness   PT Problem List Decreased strength;Decreased activity tolerance;Decreased mobility;Decreased knowledge of use of DME;Cardiopulmonary status limiting activity  PT Treatment Interventions DME instruction;Gait training;Functional mobility training;Patient/family education;Therapeutic activities;Therapeutic exercise   PT Goals (Current goals can be found in the Care Plan section) Acute Rehab PT Goals PT Goal Formulation: With patient Time For Goal Achievement: 08/21/14 Potential  to Achieve Goals: Good    Frequency Min 3X/week   Barriers to discharge        Co-evaluation               End of Session   Activity Tolerance: Other (comment) (limited by SPO2 and increased WOB) Patient left: in bed;with call bell/phone within reach;with family/visitor present;with bed alarm set Nurse Communication: Mobility status         Time: 0175-1025 PT Time Calculation (min) (ACUTE ONLY): 23 min   Charges:   PT Evaluation $Initial PT Evaluation Tier I: 1 Procedure     PT G Codes:        Adedamola Seto,KATHrine E 08/07/2014, 3:47 PM Carmelia Bake, PT, DPT 08/07/2014 Pager: [phone]

## 2014-08-07 NOTE — Progress Notes (Signed)
Patient having labored breathing at 40-45 breaths using accessory muscles and wheezing.  Oxygen saturation 100% on 2 L/M. Patient states "I do feel a little short of breath." Anti-anxiety medication given and respiratory paged for breathing treatment.  MD paged with findings. No new orders received.  Will continue to monitor.

## 2014-08-07 NOTE — Progress Notes (Signed)
[facility] PROGRESS NOTE   LIZVETTE LIGHTSEY PFX:[id number] DOB: 09/22/1923 DOA: 08/04/2014 PCP: Merrilee Seashore, MD  HPI/Subjective: Patient seen with daughter and other family members at bedside, she was eating late breakfast. Initially complained about feeling of food stuck in her throat, denies any cough with eating. Then patient said she had history of bullous pemphigoids which sometimes causes the dysphagia feeling.  Assessment/Plan: Principal Problem:   CAP (community acquired pneumonia) Active Problems:   H/O mechanical mitral valve replacement 2001   S/p nephrectomy   Coronary artery disease   Chronic atrial fibrillation   Hypothyroidism   Bullous pemphigoid   Chronic anticoagulation   Chronic right-sided heart failure   Subtherapeutic anticoagulation    Sepsis Present on admission, temperature of 104.9 and respiratory rate 28 in the presence of pneumonia. This is likely secondary to pneumonia. Was started on broad-spectrum antibiotics and IV fluids. Sputum and blood cultures pending.  CAP Community-acquired pneumonia, right lung infiltrates per CXR. Patient started on vancomycin, ceftriaxone and azithromycin. Continue current antibiotics, DC vancomycin today. Continue current supportive management with bronchodilators, mucolytics, antitussives and oxygen. Feels better today, as the x-ray has right lower lobe infiltrates, I'll check for swallowing to rule out aspiration.  Fall Likely secondary to generalized weakness from pneumonia, no loss of consciousness. CT scan of the head/C-spine did not show any evidence of fractures of bleeding. Telemetry did not show any tachyarrhythmias.  Chronic anticoagulation Patient is on warfarin for mechanical mitral valve, INR is 1.66. Per admitting physician patient and family declined use of fondaparinux as bridge, elected to go along with warfarin and ASA. Pharmacy to dose Coumadin, INR is 4.3 today, dosage per  pharmacy.  Hypothyroidism Continue Synthroid.  History of right-sided heart failure Continue home medications, continue digoxin and atenolol.  Discontinue IV fluids, Lasix as needed.   Code Status: DNR Family Communication: Plan discussed with the patient. Disposition Plan: Remains inpatient Diet: Diet 2 gram sodium Room service appropriate?: Yes; Fluid consistency:: Thin  Consultants:  None  Procedures:  None  Antibiotics:  Rocephin, azithromycin.  Vancomycin discontinued on 4/15   Objective: Filed Vitals:   08/07/14 0618  BP: 158/84  Pulse: 74  Temp: 98.2 F (36.8 C)  Resp: 22    Intake/Output Summary (Last 24 hours) at 08/07/14 1250 Last data filed at 08/06/14 1511  Gross per 24 hour  Intake    100 ml  Output      0 ml  Net    100 ml   Filed Weights   08/04/14 1649 08/04/14 2149 08/07/14 0618  Weight: 61.236 kg (135 lb) 68.1 kg (150 lb 2.1 oz) 71.4 kg (157 lb 6.5 oz)    Exam: General: Alert and awake, oriented x3, not in any acute distress. HEENT: anicteric sclera, pupils reactive to light and accommodation, EOMI CVS: S1-S2 clear, no murmur rubs or gallops Chest: clear to auscultation bilaterally, no wheezing, rales or rhonchi Abdomen: soft nontender, nondistended, normal bowel sounds, no organomegaly Extremities: no cyanosis, clubbing or edema noted bilaterally Neuro: Cranial nerves II-XII intact, no focal neurological deficits  Data Reviewed: Basic Metabolic Panel:  Recent Labs Lab 08/04/14 1658 08/05/14 0330 08/06/14 0339 08/07/14 0432  NA 134* 138 136 134*  K 4.0 3.7 3.9 3.9  CL 102 106 108 105  CO2 24 26 23 24   GLUCOSE 127* 109* 101* 108*  BUN 25* 23 24* 20  CREATININE 0.92 0.84 0.82 0.74  CALCIUM 8.3* 7.6* 7.7* 7.8*   Liver Function Tests:  Recent Labs Lab  08/04/14 1658 08/05/14 0330 08/07/14 0432  AST 118* 107* 65*  ALT 40* 42* 47*  ALKPHOS 90 91 90  BILITOT 2.1* 1.2 0.9  PROT 6.5 5.3* 5.3*  ALBUMIN 3.5 2.9* 2.8*    No results for input(s): LIPASE, AMYLASE in the last 168 hours. No results for input(s): AMMONIA in the last 168 hours. CBC:  Recent Labs Lab 08/04/14 1658 08/05/14 0330 08/07/14 0432  WBC 8.4 5.3 6.9  NEUTROABS 7.0 3.5  --   HGB 14.1 11.9* 12.4  HCT 40.6 35.7* 36.7  MCV 89.6 91.8 90.4  PLT 126* 109* 140*   Cardiac Enzymes:  Recent Labs Lab 08/04/14 1658  CKTOTAL 2709*   BNP (last 3 results) No results for input(s): BNP in the last 8760 hours.  ProBNP (last 3 results) No results for input(s): PROBNP in the last 8760 hours.  CBG: No results for input(s): GLUCAP in the last 168 hours.  Micro Recent Results (from the past 240 hour(s))  Urine culture     Status: None   Collection Time: 08/04/14  4:08 PM  Result Value Ref Range Status   Specimen Description URINE, CATHETERIZED  Final   Special Requests NONE  Final   Colony Count NO GROWTH Performed at [facility]   Final   Culture NO GROWTH Performed at [facility]   Final   Report Status 08/05/2014 FINAL  Final  MRSA PCR Screening     Status: None   Collection Time: 08/04/14  4:08 PM  Result Value Ref Range Status   MRSA by PCR NEGATIVE NEGATIVE Final    Comment:        The GeneXpert MRSA Assay (FDA approved for NASAL specimens only), is one component of a comprehensive MRSA colonization surveillance program. It is not intended to diagnose MRSA infection nor to guide or monitor treatment for MRSA infections.   Blood Culture (routine x 2)     Status: None (Preliminary result)   Collection Time: 08/04/14  4:53 PM  Result Value Ref Range Status   Specimen Description LEFT ANTECUBITAL  Final   Special Requests BOTTLES DRAWN AEROBIC AND ANAEROBIC 5CC  Final   Culture   Final           BLOOD CULTURE RECEIVED NO GROWTH TO DATE CULTURE WILL BE HELD FOR 5 DAYS BEFORE ISSUING A FINAL NEGATIVE REPORT Performed at [facility]    Report Status PENDING  Incomplete  Blood Culture  (routine x 2)     Status: None (Preliminary result)   Collection Time: 08/04/14  4:56 PM  Result Value Ref Range Status   Specimen Description BLOOD RIGHT FOREARM  Final   Special Requests BOTTLES DRAWN AEROBIC AND ANAEROBIC 5CC  Final   Culture   Final           BLOOD CULTURE RECEIVED NO GROWTH TO DATE CULTURE WILL BE HELD FOR 5 DAYS BEFORE ISSUING A FINAL NEGATIVE REPORT Performed at [facility]    Report Status PENDING  Incomplete     Studies: No results found.  Scheduled Meds: . antiseptic oral rinse  7 mL Mouth Rinse BID  . aspirin EC  325 mg Oral Daily  . atenolol  50 mg Oral Daily  . azithromycin  500 mg Intravenous Q24H  . budesonide (PULMICORT) nebulizer solution  0.25 mg Nebulization Once  . cefTRIAXone (ROCEPHIN)  IV  1 g Intravenous Q24H  . digoxin  0.0625 mg Oral Daily  . guaiFENesin  1,200 mg  Oral BID  . latanoprost  1 drop Both Eyes QHS  . levothyroxine  50 mcg Oral QAC breakfast  . lisinopril  10 mg Oral Daily  . Warfarin - Pharmacist Dosing Inpatient   Does not apply q1800   Continuous Infusions:       Time spent: 35 minutes    [facility]  [facility] Pager [phone] If 7PM-7AM, please contact night-coverage at [email or web address], password [facility] 08/07/2014, 12:50 PM  LOS: 3 days

## 2014-08-07 NOTE — Progress Notes (Signed)
Patient had increased WOB. A Duoneb treatment was given. Respirations were 30;  O2 sat 96% on 2 L [place]   PCP on call was notified

## 2014-08-08 ENCOUNTER — Inpatient Hospital Stay (HOSPITAL_COMMUNITY): Payer: Medicare Other

## 2014-08-08 ENCOUNTER — Encounter (HOSPITAL_COMMUNITY): Payer: Self-pay | Admitting: Radiology

## 2014-08-08 DIAGNOSIS — I5033 Acute on chronic diastolic (congestive) heart failure: Secondary | ICD-10-CM

## 2014-08-08 LAB — BASIC METABOLIC PANEL
Anion gap: 8 (ref 5–15)
BUN: 16 mg/dL (ref 6–23)
CALCIUM: 8 mg/dL — AB (ref 8.4–10.5)
CO2: 27 mmol/L (ref 19–32)
Chloride: 99 mmol/L (ref 96–112)
Creatinine, Ser: 0.73 mg/dL (ref 0.50–1.10)
GFR, EST AFRICAN AMERICAN: 85 mL/min — AB (ref >90)
GFR, EST NON AFRICAN AMERICAN: 74 mL/min — AB (ref >90)
GLUCOSE: 106 mg/dL — AB (ref 70–99)
POTASSIUM: 3.5 mmol/L (ref 3.5–5.1)
Sodium: 134 mmol/L — ABNORMAL LOW (ref 135–145)

## 2014-08-08 LAB — AMMONIA: Ammonia: 62 umol/L — ABNORMAL HIGH (ref 11–32)

## 2014-08-08 LAB — BLOOD GAS, ARTERIAL
Acid-Base Excess: 6.1 mmol/L — ABNORMAL HIGH (ref 0.0–2.0)
Bicarbonate: 29 mEq/L — ABNORMAL HIGH (ref 20.0–24.0)
Drawn by: 257701
O2 Content: 2 L/min
O2 SAT: 94.1 %
PCO2 ART: 36.3 mmHg (ref 35.0–45.0)
Patient temperature: 98.6
TCO2: 25.3 mmol/L (ref 0–100)
pH, Arterial: 7.514 — ABNORMAL HIGH (ref 7.350–7.450)
pO2, Arterial: 63.9 mmHg — ABNORMAL LOW (ref 80.0–100.0)

## 2014-08-08 LAB — PROTIME-INR
INR: 3.3 — AB (ref 0.00–1.49)
Prothrombin Time: 33.8 seconds — ABNORMAL HIGH (ref 11.6–15.2)

## 2014-08-08 LAB — TROPONIN I: Troponin I: 0.09 ng/mL — ABNORMAL HIGH (ref <0.031)

## 2014-08-08 LAB — GLUCOSE, CAPILLARY: Glucose-Capillary: 121 mg/dL — ABNORMAL HIGH (ref 70–99)

## 2014-08-08 MED ORDER — POTASSIUM CHLORIDE CRYS ER 20 MEQ PO TBCR
60.0000 meq | EXTENDED_RELEASE_TABLET | Freq: Once | ORAL | Status: AC
  Administered 2014-08-08: 60 meq via ORAL
  Filled 2014-08-08: qty 3

## 2014-08-08 MED ORDER — WARFARIN SODIUM 2.5 MG PO TABS
2.5000 mg | ORAL_TABLET | Freq: Once | ORAL | Status: AC
  Administered 2014-08-08: 2.5 mg via ORAL
  Filled 2014-08-08: qty 1

## 2014-08-08 MED ORDER — FUROSEMIDE 10 MG/ML IJ SOLN
40.0000 mg | Freq: Two times a day (BID) | INTRAMUSCULAR | Status: DC
  Administered 2014-08-08 – 2014-08-11 (×7): 40 mg via INTRAVENOUS
  Filled 2014-08-08 (×7): qty 4

## 2014-08-08 MED ORDER — WARFARIN - PHARMACIST DOSING INPATIENT: Freq: Every day | Status: DC

## 2014-08-08 NOTE — Progress Notes (Signed)
Clinical Social Work Department BRIEF PSYCHOSOCIAL ASSESSMENT 08/08/2014  Patient:  Amanda Richardson, Amanda Richardson     Account Number:  [id number]     Admit date:  08/04/2014  Clinical Social Worker:  Maryln Manuel  Date/Time:  08/08/2014 03:30 PM  Referred by:  Physician  Date Referred:  08/08/2014 Referred for  SNF Placement   Other Referral:   Interview type:  Patient Other interview type:   and patient family at bedside    PSYCHOSOCIAL DATA Living Status:  ALONE Admitted from facility:   Level of care:   Primary support name:  [name] Hart/daughter/[phone] Primary support relationship to patient:  CHILD, ADULT Degree of support available:   strong-pt has 2 daughters who are both actively involved.    CURRENT CONCERNS Current Concerns  Post-Acute Placement   Other Concerns:    SOCIAL WORK ASSESSMENT / PLAN CSW received referral for New SNF.    CSW met with pt, pt two daughter, [name] and Vania Rea, and pt son-in-law, [name]'s husband at bedside. CSW introduced self and explained role. Pt reports that prior to admission she was living at home alone. Pt states that at home she ambulated independently and was doing work in her yard a week ago. CSW discussed with pt and pt family recommendation from PT of short term rehab. Pt states that she does not feel that PT was able to adequately evaluate her as she was having some increased work of breathing during assessment. Pt shared that she is hopeful to improve in order to return home upon discharge. CSW inquired with pt if she would be willing to explore rehab at SNF options in case pt is not progressed to the level of independence where she would be safe at home. Pt states that she is agreeable to [facility] search to have option open, but wants CSW to be aware that agreeing to search does not mean that she is agreeing to go to rehab upon discharge until she is able to see how she progresses. CSW expressed understanding and discussed with pt that  initiation of SNF search is no guarantee that pt will choose rehab at SNF, but it provides pt with options in order to make an informed decision. Pt daughter agree that it is a good plan to explore SNF rehab options in case pt does not progress to where she is strong enough to return home. Pt daughter expressed that they are familiar with [facility] an [place] home and that would be an ideal option if pt chose the route of rehab at [facility] upon discharge.    CSW completed FL2 and initiated SNF search to [facility]. Pt has [employer] and CSW will submit for authorization when pt makes decision regarding disposition.    CSW to continue to follow to provide support and assist with pt disposition planning.   Assessment/plan status:  Psychosocial Support/Ongoing Assessment of Needs Other assessment/ plan:   discharge planning   Information/referral to community resources:   [facility] list    PATIENT'S/FAMILY'S RESPONSE TO PLAN OF CARE: Pt alert and oriented x 4. Pt is very hopeful that she will be able to regain her independence in order to return home, but agreeable to explore rehab options. Pt strong willed and motivated to continue to work with therapy in the hospital as she has not yet had to rely on assistance at home or using a walker prior to admission. Pt has strong family support.   Alison Murray, MSW, LCSW Clinical  Social Work [employer] [phone]

## 2014-08-08 NOTE — Progress Notes (Signed)
Noted patient in bed with eyes open, not following most command, unable to respond to me verbally, only able to move left arm slightly, and no other command, vital 123/58, 94%-2L, 87, 28, rapid response call and Dr. Hartford Poli notified, CT scan ordered, will continue to assess patient.

## 2014-08-08 NOTE — Significant Event (Signed)
Rapid Response Event Note  Overview: Time Called: 1705 Arrival Time: 1708 Event Type: Neurologic  Initial Focused Assessment: Pt lying in bed, in no acute distress. Bedside RN states pt is less responsive than she was earlier, slight facial droop noted on left side.  Pt has eyes open, and is nonverbal. PEARL, monitor shows at fib.      Interventions: CBG 121,  Dr Hartford Poli called by bedside RN. Will get CT of head and lab work.   Event Summary:  Around 5:20pm pt became more responsive, able to move all ext, grips are weak but present.  Able to follow commands and is now verbal. Denies pain.  BP 131/67 HR 84 Resp 30's.   Will continue to monitor on telemetry unit.     at      at          [place], Jae Dire

## 2014-08-08 NOTE — Progress Notes (Signed)
ANTICOAGULATION CONSULT NOTE - FOLLOW UP  Pharmacy Consult for Warfarin Indication: MVR and Afib  Allergies  Allergen Reactions  . Heparin Anaphylaxis    Patient Measurements: Height: 5\' 4"  (162.6 cm) Weight: 147 lb 11.3 oz (67 kg) IBW/kg (Calculated) : 54.7  Vital Signs: Temp: 98.9 F (37.2 C) (04/16 0455) Temp Source: Oral (04/16 0455) BP: 156/80 mmHg (04/16 0756) Pulse Rate: 96 (04/16 0455)   Labs:  Recent Labs  08/06/14 0339 08/07/14 0432 08/08/14 0545 08/08/14 0820  HGB  --  12.4  --   --   HCT  --  36.7  --   --   PLT  --  140*  --   --   LABPROT 31.1* 41.9* 33.8*  --   INR 2.96* 4.35* 3.30*  --   CREATININE 0.82 0.74  --  0.73    Estimated Creatinine Clearance: 44.9 mL/min (by C-G formula based on Cr of 0.73).   Medical History: Past Medical History  Diagnosis Date  . H/O mitral valve replacement 2001    mechanical heart valve; family states indication may have been MR from MVP or  rheumatic heart disease  . Thyroid disease   . Hypertension   . CHF (congestive heart failure)   . S/p nephrectomy     left  . Coronary artery disease   . 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 Urinary incontinence   . Atrial fibrillation   . DVT (deep venous thrombosis)     possible in 1990s and does not remember being on blood thinner  . Hypothyroidism   . Chronic anemia     heavy periods  . Glaucoma   . Bullous pemphigoid   . Chronic anticoagulation   . Basal cell carcinoma   . Allergic rhinitis   . Bilateral lower extremity edema     history of venous insufficiency  . Shortness of breath dyspnea    Assessment: 89 YOF on chronic warfarin PTA for history of mechanical MVR (2001) and atrial fibrillation.  Present presented 08/04/14 following fall on Monday night due to weakness, shortness of breath, and fever. Code sepsis was called. Pharmacy consulted to manage warfarin therapy while inpatient. Home warfarin dose reported as 5 mg daily except takes  2.5 mg dose on Mondays, Tuesdays, and Thursdays. Last dose reported as last week (a least two days PTA).  MD discussed with patient's family on 08/04/14 the options of bridging with fondaparinux versus continuing with no bridging agent.  Family chose to continue with warfarin therapy without bridging agent.  Today, 08/08/2014:  INR now therapeutic  Rn reports no bleeding or complications  Patient likely more sensitive to warfarin due to acute illness, low PO intake, interacting antibiotics.  Yesterday CBC: Hgb 12.4, Plt 140.  Remains stable.  Drug-interactions: Aspirin 325mg , Ceftriaxone/Azithromycin  Goal of Therapy:  INR 2.5-3.5   Plan:   Warfarin 2.5mg  po x 1 tonight  Daily INR  Monitor for signs and symptoms of bleeding  Ralene Bathe, PharmD, BCPS 08/08/2014, 11:41 AM  Pager: [phone]

## 2014-08-08 NOTE — Evaluation (Signed)
Clinical/Bedside Swallow Evaluation Patient Details  Name: Amanda Richardson MRN: [id number] Date of Birth: 1924-02-17  Today's Date: 08/08/2014 Time: SLP Start Time (ACUTE ONLY): 9371 SLP Stop Time (ACUTE ONLY): 1638 SLP Time Calculation (min) (ACUTE ONLY): 30 min  Past Medical History:  Past Medical History  Diagnosis Date  . H/O mitral valve replacement 2001    mechanical heart valve; family states indication may have been MR from MVP or  rheumatic heart disease  . Thyroid disease   . Hypertension   . CHF (congestive heart failure)   . S/p nephrectomy     left  . Coronary artery disease   . 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 Urinary incontinence   . Atrial fibrillation   . DVT (deep venous thrombosis)     possible in 1990s and does not remember being on blood thinner  . Hypothyroidism   . Chronic anemia     heavy periods  . Glaucoma   . Bullous pemphigoid   . Chronic anticoagulation   . Basal cell carcinoma   . Allergic rhinitis   . Bilateral lower extremity edema     history of venous insufficiency  . Shortness of breath dyspnea    Past Surgical History:  Past Surgical History  Procedure Laterality Date  . Abdominal hysterectomy  1960s  . Laparoscopic small bowel resection  2004 or 5    adhesions and ischemic bowel  . Mitral valve replacement  01/2000    mechanical heart valve  . Coronary artery bypass graft  01/2000  . Appendectomy  1960s  . Tonsillectomy  1933  . Hx broken collarbone    . Transthoracic echocardiogram  02/2012    EF 60-65%, mild LVH; mildly thickened AV leaflets with sclerosis and moderate regurg; mechanical MV prosthesis; LA dilated; RVSP increased; RA severely dilated; increased LA pressure; moderate TR  . Hernia repair     HPI:  81 yo female adm with increased WOB, fever, cough.  Pt PMH + for CAP, right sided heart failure, mitral valve replacement.  CXR showed increased edema, left effusion, ? LLL and RLL pna.  Pt with  event yesterday where her RR was elevated and labored.  Today respiratory status better with dosing of Lasix per MD note.     Assessment / Plan / Recommendation Clinical Impression  Pt presents with symptoms consistent with possible primary esophageal dysphagia characterized by pt report of sensation of delayed esophageal clearance.  Pt pointis to cervical esophagus stating she was "full up to here".  CN exam unremarkable.  Cough noted at baseline x3 only (approx 10 minues of med hx reviewing).   Delayed cough observed after swallow with 2/2 yogurt boluses and 2/3 water boluses.  - pt with poor awareness to this response.  Pt denies sensation of pharyngeal residuals nor choking sensation during these episodes.    Concern for possible aspiration present both from oropharyngeal due to dyspnea and esophagus from pt's symptoms.  Pt observed to use accessory muscle during shallow respirations - and becomes dyspneic easily.    Recommend consider dedicated esophageal evaluation.  Pt/RN informed and pt agreeable.    Reviewed aspiration precautions with pt using teach back stressing importance of airway protection.  Will follow up to help mitigate aspiration/dysphagia symptoms/risk.      Aspiration Risk    Moderate   Diet Recommendation Regular;Thin liquid   Liquid Administration via: Cup;Straw Medication Administration:  (as tolerated) Supervision: Patient able to self  feed Compensations: Slow rate;Small sips/bites Postural Changes and/or Swallow Maneuvers: Seated upright 90 degrees;Upright 30-60 min after meal    Other  Recommendations Oral Care Recommendations: Oral care BID   Follow Up Recommendations       Frequency and Duration min 1 x/week  1 week   Pertinent Vitals/Pain Afebrile, decreased      Swallow Study Prior Functional Status   see [place] Date of Onset: 08/08/14 HPI: 81 yo female adm with increased WOB, fever, cough.  Pt PMH + for CAP, right sided heart failure,  mitral valve replacement.  CXR showed increased edema, left effusion, ? LLL and RLL pna.  Pt with event yesterday where her RR was elevated and labored.  Today respiratory status better with dosing of Lasix per MD note.   Type of Study: Bedside swallow evaluation Diet Prior to this Study: Regular;Thin liquids Temperature Spikes Noted: No Respiratory Status: Nasal cannula History of Recent Intubation: No Behavior/Cognition: Alert;Cooperative;Pleasant mood Oral Cavity - Dentition: Adequate natural dentition Self-Feeding Abilities: Able to feed self Patient Positioning: Upright in bed Baseline Vocal Quality: Low vocal intensity;Hoarse Volitional Cough: Strong Volitional Swallow: Able to elicit    Oral/Motor/Sensory Function Overall Oral Motor/Sensory Function: Appears within functional limits for tasks assessed (except generalized weakness)   Ice Chips Ice chips: Not tested   Thin Liquid Thin Liquid: Impaired Presentation: Straw Oral Phase Functional Implications: Prolonged oral transit (pt "swished and swallowed" water causing minimal delay in oral transit) Pharyngeal  Phase Impairments: Cough - Delayed    Nectar Thick Nectar Thick Liquid: Not tested   Honey Thick Honey Thick Liquid: Not tested   Puree Puree: Impaired Presentation: Self Fed;Spoon Oral Phase Impairments: Other (comment) (n/a) Oral Phase Functional Implications: Other (comment) (n/a) Pharyngeal Phase Impairments: Cough - Immediate;Cough - Delayed Other Comments: yogurt   Solid   GO    Solid: Not tested Other Comments: pt stated she was "full up to here" pointing to her cervical esophageal region       Luanna Salk, [place] [facility] [place] [phone]

## 2014-08-08 NOTE — Progress Notes (Signed)
Clinical Social Work Department CLINICAL SOCIAL WORK PLACEMENT NOTE 08/08/2014  Patient:  Amanda Richardson, Amanda Richardson  Account Number:  [id number] [place] date:  08/04/2014  Clinical Social Worker:  Maryln Manuel  Date/time:  08/08/2014 03:45 PM  Clinical Social Work is seeking post-discharge placement for this patient at the following level of care:   SKILLED NURSING   (*CSW will update this form in Epic as items are completed)   08/08/2014  Patient/family provided with [place] Department of Clinical Social Work's list of facilities offering this level of care within the geographic area requested by the patient (or if unable, by the patient's family).  08/08/2014  Patient/family informed of their freedom to choose among providers that offer the needed level of care, that participate in Medicare, Medicaid or managed care program needed by the patient, have an available bed and are willing to accept the patient.  08/08/2014  Patient/family informed of [facility]' ownership interest in [facility], as well as of the fact that they are under no obligation to receive care at this facility.  PASARR submitted to EDS on 08/08/2014 PASARR number received on 08/08/2014  FL2 transmitted to all facilities in geographic area requested by pt/family on  08/08/2014 FL2 transmitted to all facilities within larger geographic area on   Patient informed that his/her managed care company has contracts with or will negotiate with  certain facilities, including the following:     Patient/family informed of bed offers received:   Patient chooses bed at  Physician recommends and patient chooses bed at    Patient to be transferred to  on   Patient to be transferred to facility by  Patient and family notified of transfer on  Name of family member notified:    The following physician request were entered in Epic:   Additional Comments:   Alison Murray, MSW, [place] Work Weekend  coverage [phone]

## 2014-08-08 NOTE — Progress Notes (Signed)
Patient responding but still slow than normal, unable to give PO medication until she is more responsive. Will continue to monitor. Family at the bedside.

## 2014-08-08 NOTE — Progress Notes (Signed)
[facility] PROGRESS NOTE   WAYNETTE TOWERS IOM:[id number] DOB: [name] 31, 1924 DOA: 08/04/2014 PCP: Merrilee Seashore, MD  HPI/Subjective: Patient seen with daughter at bedside. Had respiratory distress yesterday improved with Lasix. Chest x-ray showed pulmonary edema, started on 40 mg of Lasix twice a day.   Assessment/Plan: Principal Problem:   CAP (community acquired pneumonia) Active Problems:   H/O mechanical mitral valve replacement 2001   S/p nephrectomy   Coronary artery disease   Chronic atrial fibrillation   Hypothyroidism   Bullous pemphigoid   Chronic anticoagulation   Acute on chronic diastolic heart failure   Subtherapeutic anticoagulation    Sepsis Present on admission, temperature of 104.9 and respiratory rate 28 in the presence of pneumonia. This is likely secondary to pneumonia. Was started on broad-spectrum antibiotics and IV fluids. Sputum and blood cultures pending.  CAP Community-acquired pneumonia, right lung infiltrates per CXR. Patient started on vancomycin, ceftriaxone and azithromycin. Continue current antibiotics, DC vancomycin today. Continue current supportive management with bronchodilators, mucolytics, antitussives and oxygen. Feels better today, as the x-ray has right lower lobe infiltrates, I'll check for swallowing to rule out aspiration.  Fall Likely secondary to generalized weakness from pneumonia, no loss of consciousness. CT scan of the head/C-spine did not show any evidence of fractures of bleeding. Telemetry did not show any tachyarrhythmias.  Chronic anticoagulation Patient is on warfarin for mechanical mitral valve, INR is 1.66. Per admitting physician patient and family declined use of fondaparinux as bridge, elected to go along with warfarin and ASA. Pharmacy to dose Coumadin, INR is 4.3 today, dosage per pharmacy.  Hypothyroidism Continue Synthroid.  Acute on chronic diastolic CHF Continue home medications,  continue digoxin and atenolol.  Wasn't IV fluids, probably cause acute on chronic diastolic CHF, IV fluids discontinued. Patient is started on Lasix, monitor intake/output. Monitor renal function closely as patient has solitary kidney.   Code Status: DNR Family Communication: Plan discussed with the patient. Disposition Plan: Remains inpatient Diet: Diet 2 gram sodium Room service appropriate?: Yes; Fluid consistency:: Thin; Fluid restriction:: 1500 mL Fluid  Consultants:  None  Procedures:  None  Antibiotics:  Rocephin, azithromycin.  Vancomycin discontinued on 4/15   Objective: Filed Vitals:   08/08/14 0756  BP: 156/80  Pulse:   Temp:   Resp:     Intake/Output Summary (Last 24 hours) at 08/08/14 1217 Last data filed at 08/08/14 1100  Gross per 24 hour  Intake    720 ml  Output   3750 ml  Net  -3030 ml   Filed Weights   08/04/14 2149 08/07/14 0618 08/08/14 0455  Weight: 68.1 kg (150 lb 2.1 oz) 71.4 kg (157 lb 6.5 oz) 67 kg (147 lb 11.3 oz)    Exam: General: Alert and awake, oriented x3, not in any acute distress. HEENT: anicteric sclera, pupils reactive to light and accommodation, EOMI CVS: S1-S2 clear, no murmur rubs or gallops Chest: clear to auscultation bilaterally, no wheezing, rales or rhonchi Abdomen: soft nontender, nondistended, normal bowel sounds, no organomegaly Extremities: no cyanosis, clubbing or edema noted bilaterally Neuro: Cranial nerves II-XII intact, no focal neurological deficits  Data Reviewed: Basic Metabolic Panel:  Recent Labs Lab 08/04/14 1658 08/05/14 0330 08/06/14 0339 08/07/14 0432 08/08/14 0820  NA 134* 138 136 134* 134*  K 4.0 3.7 3.9 3.9 3.5  CL 102 106 108 105 99  CO2 24 26 23 24 27   GLUCOSE 127* 109* 101* 108* 106*  BUN 25* 23 24* 20 16  CREATININE 0.92 0.84 0.82 0.74  0.73  CALCIUM 8.3* 7.6* 7.7* 7.8* 8.0*   Liver Function Tests:  Recent Labs Lab 08/04/14 1658 08/05/14 0330 08/07/14 0432  AST 118*  107* 65*  ALT 40* 42* 47*  ALKPHOS 90 91 90  BILITOT 2.1* 1.2 0.9  PROT 6.5 5.3* 5.3*  ALBUMIN 3.5 2.9* 2.8*   No results for input(s): LIPASE, AMYLASE in the last 168 hours. No results for input(s): AMMONIA in the last 168 hours. CBC:  Recent Labs Lab 08/04/14 1658 08/05/14 0330 08/07/14 0432  WBC 8.4 5.3 6.9  NEUTROABS 7.0 3.5  --   HGB 14.1 11.9* 12.4  HCT 40.6 35.7* 36.7  MCV 89.6 91.8 90.4  PLT 126* 109* 140*   Cardiac Enzymes:  Recent Labs Lab 08/04/14 1658  CKTOTAL 2709*   BNP (last 3 results) No results for input(s): BNP in the last 8760 hours.  ProBNP (last 3 results) No results for input(s): PROBNP in the last 8760 hours.  CBG: No results for input(s): GLUCAP in the last 168 hours.  Micro Recent Results (from the past 240 hour(s))  Urine culture     Status: None   Collection Time: 08/04/14  4:08 PM  Result Value Ref Range Status   Specimen Description URINE, CATHETERIZED  Final   Special Requests NONE  Final   Colony Count NO GROWTH Performed at [facility]   Final   Culture NO GROWTH Performed at [facility]   Final   Report Status 08/05/2014 FINAL  Final  MRSA PCR Screening     Status: None   Collection Time: 08/04/14  4:08 PM  Result Value Ref Range Status   MRSA by PCR NEGATIVE NEGATIVE Final    Comment:        The GeneXpert MRSA Assay (FDA approved for NASAL specimens only), is one component of a comprehensive MRSA colonization surveillance program. It is not intended to diagnose MRSA infection nor to guide or monitor treatment for MRSA infections.   Blood Culture (routine x 2)     Status: None (Preliminary result)   Collection Time: 08/04/14  4:53 PM  Result Value Ref Range Status   Specimen Description LEFT ANTECUBITAL  Final   Special Requests BOTTLES DRAWN AEROBIC AND ANAEROBIC 5CC  Final   Culture   Final           BLOOD CULTURE RECEIVED NO GROWTH TO DATE CULTURE WILL BE HELD FOR 5 DAYS BEFORE ISSUING A  FINAL NEGATIVE REPORT Performed at [facility]    Report Status PENDING  Incomplete  Blood Culture (routine x 2)     Status: None (Preliminary result)   Collection Time: 08/04/14  4:56 PM  Result Value Ref Range Status   Specimen Description BLOOD RIGHT FOREARM  Final   Special Requests BOTTLES DRAWN AEROBIC AND ANAEROBIC 5CC  Final   Culture   Final           BLOOD CULTURE RECEIVED NO GROWTH TO DATE CULTURE WILL BE HELD FOR 5 DAYS BEFORE ISSUING A FINAL NEGATIVE REPORT Performed at [facility]    Report Status PENDING  Incomplete     Studies: Dg Chest Port 1 View  08/07/2014   CLINICAL DATA:  Shortness of breath and cough  EXAM: PORTABLE CHEST - 1 VIEW  COMPARISON:  August 04, 2014  FINDINGS: There is a new pleural effusion on the left with left lower lobe consolidation, new. There is extensive airspace consolidation and edema throughout the right lung, slightly increased.  Heart is enlarged with pulmonary venous hypertension. Patient is status post mitral valve replacement. No pneumothorax. No adenopathy.  IMPRESSION: Congestive heart failure, with increased edema and new left effusion. Areas of consolidation on the right may represent superimposed pneumonia. Left lower lobe consolidation is also concerning for pneumonia. Both congestive heart failure and pneumonia may exist concurrently.   Electronically Signed   By: Lowella Grip III M.D.   On: 08/07/2014 15:41    Scheduled Meds: . antiseptic oral rinse  7 mL Mouth Rinse BID  . aspirin EC  325 mg Oral Daily  . atenolol  50 mg Oral Daily  . azithromycin  500 mg Intravenous Q24H  . budesonide (PULMICORT) nebulizer solution  0.25 mg Nebulization Once  . cefTRIAXone (ROCEPHIN)  IV  1 g Intravenous Q24H  . digoxin  0.0625 mg Oral Daily  . furosemide  40 mg Intravenous Q12H  . guaiFENesin  1,200 mg Oral BID  . latanoprost  1 drop Both Eyes QHS  . levothyroxine  50 mcg Oral QAC breakfast  . lisinopril  10 mg Oral  Daily  . warfarin  2.5 mg Oral ONCE-1800  . Warfarin - Pharmacist Dosing Inpatient   Does not apply q1800   Continuous Infusions:       Time spent: 35 minutes    [facility]  [facility] Pager [phone] If 7PM-7AM, please contact night-coverage at [email or web address], password [facility] 08/08/2014, 12:17 PM  LOS: 4 days

## 2014-08-08 NOTE — Progress Notes (Signed)
Patient responding back slowly but not her baseline yet, able to move arms and legs, but speech not as clear as before. Will continue to assess patient.

## 2014-08-08 NOTE — Progress Notes (Signed)
[facility] progress note. Chief complaint. Altered mental status. History of present illness. This 81 year old female in hospital with community-acquired pneumonia and sepsis. She has a history of mechanical heart valve and is chronically on Coumadin with today's INR level  3.3. Patient had an event late afternoon where she was noted to be staring fixedly and nonverbal as well as not following commands. A CT scan of the brain was ordered and resulted no acute findings with chronic atrophy. Nursing reported to me that the family arrived this evening and found the patient dysarthric and confused. I came up to see the patient at bedside and to address the family's concerns. I found the patient alert but a poor historian secondary to confusion. Physical exam. Vital signs. Temperature 97.5, pulse 77, respiration 24, blood pressure 124/53. General appearance. Frail elderly female who is alert and in no distress. Cardiac. Rate and rhythm regular. Lungs. Breath sounds are reduced in the bases otherwise clear. Abdomen. Soft with positive bowel sounds. Neurologic. Cranial nerves II-12 grossly intact. No unilateral or focal defects found. I found the patient to be speaking clearly but her answers are not appropriate. She does not appear to be oriented. Discussed the case with neurology Dr. Nicole Kindred who recommended a follow-up MRI to rule out CVA. The patient is fully anticoagulated does not a candidate for any procedures. If MRI is positive and neurology consult is desired neurology requests we contact them officially for a consult.

## 2014-08-08 NOTE — Progress Notes (Signed)
F/c ordered d/t severe dyspnea with turning, IV lasix and strict I &O. Patient voided approximately 50cc's into female urinal. RN then inserted Foley catheter using sterile technique per physician order. Output of 800 ml of clear yellow urine after insertion. Patient tolerated insertion well, stat lock placed.  Barbee Shro[place]. Brigitte Pulse, RN

## 2014-08-09 ENCOUNTER — Inpatient Hospital Stay (HOSPITAL_COMMUNITY): Payer: Medicare Other

## 2014-08-09 LAB — COMPREHENSIVE METABOLIC PANEL
ALBUMIN: 2.7 g/dL — AB (ref 3.5–5.2)
ALT: 39 U/L — AB (ref 0–35)
AST: 45 U/L — AB (ref 0–37)
Alkaline Phosphatase: 125 U/L — ABNORMAL HIGH (ref 39–117)
Anion gap: 6 (ref 5–15)
BUN: 18 mg/dL (ref 6–23)
CALCIUM: 7.9 mg/dL — AB (ref 8.4–10.5)
CHLORIDE: 100 mmol/L (ref 96–112)
CO2: 30 mmol/L (ref 19–32)
Creatinine, Ser: 0.78 mg/dL (ref 0.50–1.10)
GFR calc non Af Amer: 72 mL/min — ABNORMAL LOW (ref >90)
GFR, EST AFRICAN AMERICAN: 83 mL/min — AB (ref >90)
Glucose, Bld: 108 mg/dL — ABNORMAL HIGH (ref 70–99)
POTASSIUM: 3.5 mmol/L (ref 3.5–5.1)
Sodium: 136 mmol/L (ref 135–145)
Total Bilirubin: 0.8 mg/dL (ref 0.3–1.2)
Total Protein: 5.4 g/dL — ABNORMAL LOW (ref 6.0–8.3)

## 2014-08-09 LAB — TROPONIN I
Troponin I: 0.07 ng/mL — ABNORMAL HIGH (ref <0.031)
Troponin I: 0.08 ng/mL — ABNORMAL HIGH (ref <0.031)

## 2014-08-09 LAB — PROTIME-INR
INR: 2.61 — AB (ref 0.00–1.49)
Prothrombin Time: 28.2 seconds — ABNORMAL HIGH (ref 11.6–15.2)

## 2014-08-09 MED ORDER — POTASSIUM CHLORIDE CRYS ER 10 MEQ PO TBCR
60.0000 meq | EXTENDED_RELEASE_TABLET | Freq: Once | ORAL | Status: AC
  Administered 2014-08-09: 60 meq via ORAL
  Filled 2014-08-09: qty 6

## 2014-08-09 MED ORDER — WARFARIN SODIUM 5 MG PO TABS
5.0000 mg | ORAL_TABLET | Freq: Once | ORAL | Status: AC
  Administered 2014-08-09: 5 mg via ORAL
  Filled 2014-08-09: qty 1

## 2014-08-09 NOTE — Consult Note (Signed)
Referring Physician: Dr Hartford Poli    Chief Complaint: Transient aphasia  HPI: Amanda Richardson is an 81 y.o. female admitted to [facility] on 08/04/2014 for evaluation of a recent fall associated with shortness of breath and generalized weakness. The patient has a history of congestive heart failure, hypertension, coronary artery disease, she is status post mechanical mitral replacement and is on chronic Coumadin therapy. She also has atrial fibrillation, and a previous DVT. Since admission the patient's warfarin level has been therapeutic. She was found to have possible pneumonia and is currently on IV antibiotic therapy for this problem. Yesterday her swallowing was being evaluated by the speech therapist when it was noted that the patient was having difficulty getting her words out. The patient's nurse also noted that the patient was less responsive. Dr Hartford Poli was notified and evaluated the patient. A neurology consult was requested. A CT scan of the head was performed that showed atrophy but no acute changes.  Today most of the history is obtained from the patient's daughter. She reports that the patient was living alone and independently prior to admission. She still drives. She is very active. She takes care of her own home. She has not had any noticeable memory deficits in the past. The daughter was contacted by the patient's nurse yesterday after the above-noted episode. The patient's daughter came to the hospital and she agreed that her mother was less responsive than normal. She was not speaking much. When asked questions the patient had difficulty responding. When asked what her name was she answered with a string of numbers. The patient's daughter did not note any facial droop but felt that her speech was mildly slurred. The patient's nurse felt that the patient was having difficulty moving her extremities.The patient continues to be somewhat less responsive and less talkative today but not  as bad as yesterday. With questioning the patient is notably confused. An MRI is being anticipated although there are concerns regarding the patient's mechanical mitral valve compatibility with the MRI.  Date last known well: Date: 08/08/2014 Time last known well: Time: 17:00 tPA Given: No: Deficits. Improving.  Past Medical History  Diagnosis Date  . H/O mitral valve replacement 2001    mechanical heart valve; family states indication may have been MR from MVP or  rheumatic heart disease  . Thyroid disease   . Hypertension   . CHF (congestive heart failure)   . S/p nephrectomy     left  . Coronary artery disease   . 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 Urinary incontinence   . Atrial fibrillation   . DVT (deep venous thrombosis)     possible in 1990s and does not remember being on blood thinner  . Hypothyroidism   . Chronic anemia     heavy periods  . Glaucoma   . Bullous pemphigoid   . Chronic anticoagulation   . Basal cell carcinoma   . Allergic rhinitis   . Bilateral lower extremity edema     history of venous insufficiency  . Shortness of breath dyspnea     Past Surgical History  Procedure Laterality Date  . Abdominal hysterectomy  1960s  . Laparoscopic small bowel resection  2004 or 5    adhesions and ischemic bowel  . Mitral valve replacement  01/2000    mechanical heart valve  . Coronary artery bypass graft  01/2000  . Appendectomy  1960s  . Tonsillectomy  1933  .  Hx broken collarbone    . Transthoracic echocardiogram  02/2012    EF 60-65%, mild LVH; mildly thickened AV leaflets with sclerosis and moderate regurg; mechanical MV prosthesis; LA dilated; RVSP increased; RA severely dilated; increased LA pressure; moderate TR  . Hernia repair      Family History  Problem Relation Age of Onset  . Irregular heart beat Mother   . Other Mother     pellegra  . Heart failure Mother     had very swollen legs??  . Stroke Mother   . Prostate cancer  Father     also skin cancer  . Heart attack Daughter   . Heart disease Brother   . Heart attack Brother 49    still alive   Social History:  reports that she has never smoked. She has never used smokeless tobacco. She reports that she does not drink alcohol or use illicit drugs.  Allergies:  Allergies  Allergen Reactions  . Heparin Anaphylaxis    Medications:  Scheduled: . antiseptic oral rinse  7 mL Mouth Rinse BID  . aspirin EC  325 mg Oral Daily  . atenolol  50 mg Oral Daily  . azithromycin  500 mg Intravenous Q24H  . budesonide (PULMICORT) nebulizer solution  0.25 mg Nebulization Once  . cefTRIAXone (ROCEPHIN)  IV  1 g Intravenous Q24H  . digoxin  0.0625 mg Oral Daily  . furosemide  40 mg Intravenous Q12H  . guaiFENesin  1,200 mg Oral BID  . latanoprost  1 drop Both Eyes QHS  . levothyroxine  50 mcg Oral QAC breakfast  . lisinopril  10 mg Oral Daily  . warfarin  5 mg Oral ONCE-1800  . Warfarin - Pharmacist Dosing Inpatient   Does not apply q1800    ROS: History obtained from the patient's daughter  General ROS: negative for - chills, fatigue, fever, night sweats, weight gain or weight loss. Positive for generalized weakness Psychological ROS: negative for - behavioral disorder, hallucinations, memory difficulties, mood swings or suicidal ideation Ophthalmic ROS: negative for - blurry vision, double vision, eye pain or loss of vision ENT ROS: negative for - epistaxis, nasal discharge, oral lesions, sore throat, tinnitus or vertigo Allergy and Immunology ROS: negative for - hives or itchy/watery eyes Hematological and Lymphatic ROS: negative for - bleeding problems, bruising or swollen lymph nodes Endocrine ROS: negative for - galactorrhea, hair pattern changes, polydipsia/polyuria or temperature intolerance Respiratory ROS: negative for - hemoptysis, or wheezing. Positive for cough and shortness of breath. Cardiovascular ROS: negative for - chest pain, dyspnea on  exertion, edema or irregular heartbeat Gastrointestinal ROS: negative for - abdominal pain, diarrhea, hematemesis, nausea/vomiting or stool incontinence Genito-Urinary ROS: negative for - dysuria, hematuria, incontinence or urinary frequency/urgency Musculoskeletal ROS: negative for - joint swelling or muscular weakness Neurological ROS: as noted in HPI Dermatological ROS: negative for rash and skin lesion changes   Physical Examination: Blood pressure 131/57, pulse 76, temperature 97.7 F (36.5 C), temperature source Oral, resp. rate 32, height 5\' 4"  (1.626 m), weight 62.7 kg (138 lb 3.7 oz), SpO2 97 %.  General - pleasantly confused 81 year old female alert but somewhat slow to respond. Heart - Regular rate and rhythm - crisp valve click noted Lungs - decreased breath sounds in all fields. Poor inspiratory effort. Abdomen - Soft - non tender Extremities - Distal pulses intact - no edema Skin - Warm and dry  Mental Status: Disoriented. The patient was able to name [facility]. She does not  know why she is in the hospital. She believes the year is 1. When asked the year of birth she also responded 1946. She believes the month is October. She was able to identify her daughter sitting at the bedside. Speech slow and soft.  Could not do simple calculations. Able to follow 3 step commands with some difficulty Cranial Nerves: II: Discs not visualized; Visual fields grossly normal, pupils equal, round, reactive to light and accommodation III,IV, VI: ptosis not present, extra-ocular motions intact bilaterally V,VII: smile symmetric, facial light touch sensation normal bilaterally VIII: hearing normal bilaterally IX,X: gag reflex present XI: bilateral shoulder shrug XII: midline tongue extension Motor: 3-4 over 5 throughout. No unilateral weakness identified. Tone and bulk:normal tone throughout; no atrophy noted Sensory: Light touch intact in all areas Deep Tendon Reflexes: 1 -  2+ and symmetric throughout Plantars: Right: downgoing   Left: downgoing Cerebellar: normal finger-to-nose but very slow Gait: Deferred CV: pulses palpable throughout   Laboratory Studies:  Basic Metabolic Panel:  Recent Labs Lab 08/05/14 0330 08/06/14 0339 08/07/14 0432 08/08/14 0820 08/09/14 0526  NA 138 136 134* 134* 136  K 3.7 3.9 3.9 3.5 3.5  CL 106 108 105 99 100  CO2 26 23 24 27 30   GLUCOSE 109* 101* 108* 106* 108*  BUN 23 24* 20 16 18   CREATININE 0.84 0.82 0.74 0.73 0.78  CALCIUM 7.6* 7.7* 7.8* 8.0* 7.9*    Liver Function Tests:  Recent Labs Lab 08/04/14 1658 08/05/14 0330 08/07/14 0432 08/09/14 0526  AST 118* 107* 65* 45*  ALT 40* 42* 47* 39*  ALKPHOS 90 91 90 125*  BILITOT 2.1* 1.2 0.9 0.8  PROT 6.5 5.3* 5.3* 5.4*  ALBUMIN 3.5 2.9* 2.8* 2.7*   No results for input(s): LIPASE, AMYLASE in the last 168 hours.  Recent Labs Lab 08/08/14 1816  AMMONIA 62*    CBC:  Recent Labs Lab 08/04/14 1658 08/05/14 0330 08/07/14 0432  WBC 8.4 5.3 6.9  NEUTROABS 7.0 3.5  --   HGB 14.1 11.9* 12.4  HCT 40.6 35.7* 36.7  MCV 89.6 91.8 90.4  PLT 126* 109* 140*    Cardiac Enzymes:  Recent Labs Lab 08/04/14 1658 08/08/14 1816 08/08/14 2330 08/09/14 0526  CKTOTAL 2709*  --   --   --   TROPONINI  --  0.09* 0.08* 0.07*    BNP: Invalid input(s): POCBNP  CBG:  Recent Labs Lab 08/08/14 1713  GLUCAP 121*    Microbiology: Results for orders placed or performed during the hospital encounter of 08/04/14  Urine culture     Status: None   Collection Time: 08/04/14  4:08 PM  Result Value Ref Range Status   Specimen Description URINE, CATHETERIZED  Final   Special Requests NONE  Final   Colony Count NO GROWTH Performed at [facility]   Final   Culture NO GROWTH Performed at [facility]   Final   Report Status 08/05/2014 FINAL  Final  MRSA PCR Screening     Status: None   Collection Time: 08/04/14  4:08 PM  Result Value Ref  Range Status   MRSA by PCR NEGATIVE NEGATIVE Final    Comment:        The GeneXpert MRSA Assay (FDA approved for NASAL specimens only), is one component of a comprehensive MRSA colonization surveillance program. It is not intended to diagnose MRSA infection nor to guide or monitor treatment for MRSA infections.   Blood Culture (routine x 2)  Status: None (Preliminary result)   Collection Time: 08/04/14  4:53 PM  Result Value Ref Range Status   Specimen Description LEFT ANTECUBITAL  Final   Special Requests BOTTLES DRAWN AEROBIC AND ANAEROBIC 5CC  Final   Culture   Final           BLOOD CULTURE RECEIVED NO GROWTH TO DATE CULTURE WILL BE HELD FOR 5 DAYS BEFORE ISSUING A FINAL NEGATIVE REPORT Performed at [facility]    Report Status PENDING  Incomplete  Blood Culture (routine x 2)     Status: None (Preliminary result)   Collection Time: 08/04/14  4:56 PM  Result Value Ref Range Status   Specimen Description BLOOD RIGHT FOREARM  Final   Special Requests BOTTLES DRAWN AEROBIC AND ANAEROBIC 5CC  Final   Culture   Final           BLOOD CULTURE RECEIVED NO GROWTH TO DATE CULTURE WILL BE HELD FOR 5 DAYS BEFORE ISSUING A FINAL NEGATIVE REPORT Performed at [facility]    Report Status PENDING  Incomplete    Coagulation Studies:  Recent Labs  08/07/14 0432 08/08/14 0545 08/09/14 0526  LABPROT 41.9* 33.8* 28.2*  INR 4.35* 3.30* 2.61*    Urinalysis:  Recent Labs Lab 08/04/14 1608  COLORURINE ORANGE*  LABSPEC 1.026  PHURINE 6.0  GLUCOSEU NEGATIVE  HGBUR LARGE*  BILIRUBINUR MODERATE*  KETONESUR NEGATIVE  PROTEINUR >300*  UROBILINOGEN 4.0*  NITRITE NEGATIVE  LEUKOCYTESUR TRACE*    Lipid Panel: No results found for: CHOL, TRIG, HDL, CHOLHDL, VLDL, LDLCALC  HgbA1C: No results found for: HGBA1C  Urine Drug Screen:  No results found for: LABOPIA, COCAINSCRNUR, LABBENZ, AMPHETMU, THCU, LABBARB  Alcohol Level: No results for input(s): ETH in the  last 168 hours.  Other results: EKG: Atrial fibrillation with ventricular response at 102 bpm.  Imaging:  Ct Head Wo Contrast 08/08/2014    No acute findings.  Chronic atrophy.       Dg Chest Port 1 View 08/07/2014    Congestive heart failure, with increased edema and new left effusion. Areas of consolidation on the right may represent superimposed pneumonia. Left lower lobe consolidation is also concerning for pneumonia. Both congestive heart failure and pneumonia may exist concurrently.       Assessment: 81 y.o. female with recent confusion and speech difficulties in the setting of pneumonia and possible congestive heart failure. The patient has been on warfarin for a prosthetic heart valve as well as atrial fibrillation, with therapeutic INR. She is now also on aspirin 325 mg daily. This episode may represent a stroke or a TIA however her exam at this time is unremarkable other than confusion. An MRI has been ordered. An MRA might also be a consideration if her prosthetic heart pelvis compatible. Carotid Dopplers and a 2-D echo might also be performed. Doubt seizure and will not suggest EEG at this moment.  Stroke Risk Factors - atrial fibrillation, family history, hypertension and coronary artery disease and prosthetic heart valve.  Plan: 1. HgbA1c, fasting lipid panel 2. MRI, MRA  of the brain without contrast 3. PT consult, OT consult, Speech consult 4. Echocardiogram 5. Carotid dopplers 6. Prophylactic therapy-Anticoagulation: Coumadin- at current dose along with aspirin - consider decreasing dose to 81 mg daily. 7. NPO until RN stroke swallow screen 8. Telemetry monitoring 9. Frequent neuro checks   Lowry Ram [facility] Pager [phone] 08/09/2014, 3:11 PM

## 2014-08-09 NOTE — Progress Notes (Signed)
Patient and family unsure if patient has a metallic implant from mitral valve replacement in 2001. Family unable to find records at home to support which model for MRI to assess.  Family instructed to call MD who did procedure in 2001 to fax records to [facility] in the morning.  MD made aware of update.

## 2014-08-09 NOTE — Progress Notes (Addendum)
ANTICOAGULATION CONSULT NOTE - FOLLOW UP  Pharmacy Consult for Warfarin Indication: MVR and Afib  Allergies  Allergen Reactions  . Heparin Anaphylaxis    Patient Measurements: Height: 5\' 4"  (162.6 cm) Weight: 138 lb 3.7 oz (62.7 kg) IBW/kg (Calculated) : 54.7  Vital Signs: Temp Source: Oral (04/17 0553) BP: 142/67 mmHg (04/17 0553) Pulse Rate: 61 (04/17 0553)   Labs:  Recent Labs  08/07/14 0432 08/08/14 0545 08/08/14 0820 08/08/14 1816 08/08/14 2330 08/09/14 0526  HGB 12.4  --   --   --   --   --   HCT 36.7  --   --   --   --   --   PLT 140*  --   --   --   --   --   LABPROT 41.9* 33.8*  --   --   --  28.2*  INR 4.35* 3.30*  --   --   --  2.61*  CREATININE 0.74  --  0.73  --   --  0.78  TROPONINI  --   --   --  0.09* 0.08* 0.07*    Estimated Creatinine Clearance: 41.2 mL/min (by C-G formula based on Cr of 0.78).   Medical History: Past Medical History  Diagnosis Date  . H/O mitral valve replacement 2001    mechanical heart valve; family states indication may have been MR from MVP or  rheumatic heart disease  . Thyroid disease   . Hypertension   . CHF (congestive heart failure)   . S/p nephrectomy     left  . Coronary artery disease   . 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 Urinary incontinence   . Atrial fibrillation   . DVT (deep venous thrombosis)     possible in 1990s and does not remember being on blood thinner  . Hypothyroidism   . Chronic anemia     heavy periods  . Glaucoma   . Bullous pemphigoid   . Chronic anticoagulation   . Basal cell carcinoma   . Allergic rhinitis   . Bilateral lower extremity edema     history of venous insufficiency  . Shortness of breath dyspnea    Assessment: 89 YOF on chronic warfarin PTA for history of mechanical MVR (2001) and atrial fibrillation.  Present presented 08/04/14 following fall on Monday night due to weakness, shortness of breath, and fever. Code sepsis was called. Pharmacy consulted  to manage warfarin therapy while inpatient.   Home warfarin dose reported as 5 mg daily except takes 2.5 mg dose on Mondays, Tuesdays, and Thursdays.  Last dose reported as last week (a least two days PTA).  MD discussed with patient's family on 08/04/14 the options of bridging with fondaparinux versus continuing with no bridging agent.  Family chose to continue with warfarin therapy without bridging agent.  Today, 08/09/2014:  INR remains therapeutic (goal 2.5-3.5)  Pt with episode of unresponsiveness yesterday afternoon, CT scan negative, plan for MRI per neurology recommendations to rule out CVA.  MRI delayed until family can obtain medical records on type of MVR implant to know which model MRI to use.   CBC (4/15): Hgb ok, plts slightly low  Drug-interactions: Aspirin 325mg , Ceftriaxone/Azithromycin  RN notes pt has bleeding in one nostril, MD aware and wishes to continue warfarin tonight  Goal of Therapy:  INR 2.5-3.5   Plan:   Warfarin 5mg  po x 1 tonight  Daily INR, check CBC tomorrow   Monitor  for signs and symptoms of bleeding  F/u MRI results  Ralene Bathe, PharmD, BCPS 08/09/2014, 2:02 PM  Pager: [phone]

## 2014-08-09 NOTE — Progress Notes (Addendum)
[facility] PROGRESS NOTE   Amanda Richardson GGE:[id number] DOB: 1923/11/03 DOA: 08/04/2014 PCP: Merrilee Seashore, MD  HPI/Subjective: Patient seen with daughter at bedside. Had episode of unresponsiveness, unclear what happened. Per family and RN report patient eyes were opened but was not following commands. CT scan is negative  Assessment/Plan: Principal Problem:   CAP (community acquired pneumonia) Active Problems:   H/O mechanical mitral valve replacement 2001   S/p nephrectomy   Coronary artery disease   Chronic atrial fibrillation   Hypothyroidism   Bullous pemphigoid   Chronic anticoagulation   Acute on chronic diastolic heart failure   Subtherapeutic anticoagulation    Sepsis Present on admission, temperature of 104.9 and respiratory rate 28 in the presence of pneumonia. This is likely secondary to pneumonia. Was started on broad-spectrum antibiotics and IV fluids. Sputum and blood cultures pending. No changes in regimen.  CAP Community-acquired pneumonia, right lung infiltrates per CXR. Patient started on vancomycin, ceftriaxone and azithromycin. Continue current antibiotics, DC vancomycin today. Continue current supportive management with bronchodilators, mucolytics, antitussives and oxygen. Feels better today, as the x-ray has right lower lobe infiltrates, I'll check for swallowing to rule out aspiration.  Unresponsiveness Patient was in bed, had episode of unresponsiveness with her eyes wide open but she does not follow commands. CT scan of the head repeated because of the recent fall looking for delayed bleeding, no acute events. MRI to be done, and asked neurology to see her.  Fall Likely secondary to generalized weakness from pneumonia, no loss of consciousness. CT scan of the head/C-spine did not show any evidence of fractures of bleeding. Telemetry did not show any tachyarrhythmias.  Chronic anticoagulation/mechanical mitral valve Patient  is on warfarin for mechanical mitral valve, INR is 1.66. Per admitting physician patient and family declined use of fondaparinux as bridge, elected to go along with warfarin and ASA. Pharmacy to dose Coumadin, INR is 4.3 today, dosage per pharmacy.  Hypothyroidism Continue Synthroid.  Acute on chronic diastolic CHF Continue home medications, continue digoxin and atenolol.  Wasn't IV fluids, probably cause acute on chronic diastolic CHF, IV fluids discontinued. Patient is started on Lasix, monitor intake/output. Monitor renal function closely as patient has solitary kidney.  Slightly elevated INR INR is slightly elevated, cereals are 0.09, 0.08 and 0.07. Likely the slight elevation secondary to acute diastolic CHF, patient did not mention any chest pain.   Code Status: DNR Family Communication: Plan discussed with the patient. Disposition Plan: Remains inpatient Diet: Diet 2 gram sodium Room service appropriate?: Yes; Fluid consistency:: Thin; Fluid restriction:: 1500 mL Fluid  Consultants:  None  Procedures:  None  Antibiotics:  Rocephin, azithromycin.  Vancomycin discontinued on 4/15   Objective: Filed Vitals:   08/09/14 0827  BP:   Pulse:   Temp:   Resp: 33    Intake/Output Summary (Last 24 hours) at 08/09/14 1117 Last data filed at 08/09/14 0552  Gross per 24 hour  Intake    780 ml  Output   2695 ml  Net  -1915 ml   Filed Weights   08/07/14 0618 08/08/14 0455 08/09/14 0500  Weight: 71.4 kg (157 lb 6.5 oz) 67 kg (147 lb 11.3 oz) 62.7 kg (138 lb 3.7 oz)    Exam: General: Alert and awake, oriented x3, not in any acute distress. HEENT: anicteric sclera, pupils reactive to light and accommodation, EOMI CVS: S1-S2 clear, no murmur rubs or gallops Chest: clear to auscultation bilaterally, no wheezing, rales or rhonchi Abdomen: soft nontender, nondistended, normal bowel  sounds, no organomegaly Extremities: no cyanosis, clubbing or edema noted  bilaterally Neuro: Cranial nerves II-XII intact, no focal neurological deficits  Data Reviewed: Basic Metabolic Panel:  Recent Labs Lab 08/05/14 0330 08/06/14 0339 08/07/14 0432 08/08/14 0820 08/09/14 0526  NA 138 136 134* 134* 136  K 3.7 3.9 3.9 3.5 3.5  CL 106 108 105 99 100  CO2 26 23 24 27 30   GLUCOSE 109* 101* 108* 106* 108*  BUN 23 24* 20 16 18   CREATININE 0.84 0.82 0.74 0.73 0.78  CALCIUM 7.6* 7.7* 7.8* 8.0* 7.9*   Liver Function Tests:  Recent Labs Lab 08/04/14 1658 08/05/14 0330 08/07/14 0432 08/09/14 0526  AST 118* 107* 65* 45*  ALT 40* 42* 47* 39*  ALKPHOS 90 91 90 125*  BILITOT 2.1* 1.2 0.9 0.8  PROT 6.5 5.3* 5.3* 5.4*  ALBUMIN 3.5 2.9* 2.8* 2.7*   No results for input(s): LIPASE, AMYLASE in the last 168 hours.  Recent Labs Lab 08/08/14 1816  AMMONIA 62*   CBC:  Recent Labs Lab 08/04/14 1658 08/05/14 0330 08/07/14 0432  WBC 8.4 5.3 6.9  NEUTROABS 7.0 3.5  --   HGB 14.1 11.9* 12.4  HCT 40.6 35.7* 36.7  MCV 89.6 91.8 90.4  PLT 126* 109* 140*   Cardiac Enzymes:  Recent Labs Lab 08/04/14 1658 08/08/14 1816 08/08/14 2330 08/09/14 0526  CKTOTAL 2709*  --   --   --   TROPONINI  --  0.09* 0.08* 0.07*   BNP (last 3 results) No results for input(s): BNP in the last 8760 hours.  ProBNP (last 3 results) No results for input(s): PROBNP in the last 8760 hours.  CBG:  Recent Labs Lab 08/08/14 1713  GLUCAP 121*    Micro Recent Results (from the past 240 hour(s))  Urine culture     Status: None   Collection Time: 08/04/14  4:08 PM  Result Value Ref Range Status   Specimen Description URINE, CATHETERIZED  Final   Special Requests NONE  Final   Colony Count NO GROWTH Performed at [facility]   Final   Culture NO GROWTH Performed at [facility]   Final   Report Status 08/05/2014 FINAL  Final  MRSA PCR Screening     Status: None   Collection Time: 08/04/14  4:08 PM  Result Value Ref Range Status   MRSA by PCR  NEGATIVE NEGATIVE Final    Comment:        The GeneXpert MRSA Assay (FDA approved for NASAL specimens only), is one component of a comprehensive MRSA colonization surveillance program. It is not intended to diagnose MRSA infection nor to guide or monitor treatment for MRSA infections.   Blood Culture (routine x 2)     Status: None (Preliminary result)   Collection Time: 08/04/14  4:53 PM  Result Value Ref Range Status   Specimen Description LEFT ANTECUBITAL  Final   Special Requests BOTTLES DRAWN AEROBIC AND ANAEROBIC 5CC  Final   Culture   Final           BLOOD CULTURE RECEIVED NO GROWTH TO DATE CULTURE WILL BE HELD FOR 5 DAYS BEFORE ISSUING A FINAL NEGATIVE REPORT Performed at [facility]    Report Status PENDING  Incomplete  Blood Culture (routine x 2)     Status: None (Preliminary result)   Collection Time: 08/04/14  4:56 PM  Result Value Ref Range Status   Specimen Description BLOOD RIGHT FOREARM  Final   Special Requests BOTTLES  DRAWN AEROBIC AND ANAEROBIC 5CC  Final   Culture   Final           BLOOD CULTURE RECEIVED NO GROWTH TO DATE CULTURE WILL BE HELD FOR 5 DAYS BEFORE ISSUING A FINAL NEGATIVE REPORT Performed at [facility]    Report Status PENDING  Incomplete     Studies: Ct Head Wo Contrast  08/08/2014   CLINICAL DATA:  Mental status changes, not following commands  EXAM: CT HEAD WITHOUT CONTRAST  TECHNIQUE: Contiguous axial images were obtained from the base of the skull through the vertex without intravenous contrast.  COMPARISON:  08/04/2014  FINDINGS: Severe diffuse atrophy. Low attenuation in the deep white matter. No hydrocephalus. No evidence of cortical infarct hemorrhage or extra-axial fluid. No intracranial mass. Chronic lacunar infarct right caudate head stable. Calvarium intact. Visualized portions of the paranasal sinuses clear.  IMPRESSION: No acute findings.  Chronic atrophy.   Electronically Signed   By: Skipper Cliche M.D.   On:  08/08/2014 18:15   Dg Chest Port 1 View  08/07/2014   CLINICAL DATA:  Shortness of breath and cough  EXAM: PORTABLE CHEST - 1 VIEW  COMPARISON:  August 04, 2014  FINDINGS: There is a new pleural effusion on the left with left lower lobe consolidation, new. There is extensive airspace consolidation and edema throughout the right lung, slightly increased. Heart is enlarged with pulmonary venous hypertension. Patient is status post mitral valve replacement. No pneumothorax. No adenopathy.  IMPRESSION: Congestive heart failure, with increased edema and new left effusion. Areas of consolidation on the right may represent superimposed pneumonia. Left lower lobe consolidation is also concerning for pneumonia. Both congestive heart failure and pneumonia may exist concurrently.   Electronically Signed   By: Lowella Grip III M.D.   On: 08/07/2014 15:41    Scheduled Meds: . antiseptic oral rinse  7 mL Mouth Rinse BID  . aspirin EC  325 mg Oral Daily  . atenolol  50 mg Oral Daily  . azithromycin  500 mg Intravenous Q24H  . budesonide (PULMICORT) nebulizer solution  0.25 mg Nebulization Once  . cefTRIAXone (ROCEPHIN)  IV  1 g Intravenous Q24H  . digoxin  0.0625 mg Oral Daily  . furosemide  40 mg Intravenous Q12H  . guaiFENesin  1,200 mg Oral BID  . latanoprost  1 drop Both Eyes QHS  . levothyroxine  50 mcg Oral QAC breakfast  . lisinopril  10 mg Oral Daily  . Warfarin - Pharmacist Dosing Inpatient   Does not apply q1800   Continuous Infusions:       Time spent: 35 minutes    [facility]  [facility] Pager [phone] If 7PM-7AM, please contact night-coverage at [email or web address], password [facility] 08/09/2014, 11:17 AM  LOS: 5 days

## 2014-08-09 NOTE — Progress Notes (Signed)
Patient had small amount of blood from left nare after blowing her nose.  Reported to pharmacist who ordered Coumadin dosage for today.  Pharmacist informed MD who in turn stated it was ok to give to patient.  Will continue to monitor for any other signs of bleeding.

## 2014-08-10 ENCOUNTER — Inpatient Hospital Stay (HOSPITAL_COMMUNITY): Payer: Medicare Other

## 2014-08-10 DIAGNOSIS — R404 Transient alteration of awareness: Secondary | ICD-10-CM

## 2014-08-10 LAB — CULTURE, BLOOD (ROUTINE X 2)
CULTURE: NO GROWTH
Culture: NO GROWTH

## 2014-08-10 LAB — PROTIME-INR
INR: 2.84 — AB (ref 0.00–1.49)
PROTHROMBIN TIME: 30.1 s — AB (ref 11.6–15.2)

## 2014-08-10 LAB — BASIC METABOLIC PANEL
ANION GAP: 5 (ref 5–15)
BUN: 21 mg/dL (ref 6–23)
CO2: 31 mmol/L (ref 19–32)
Calcium: 7.8 mg/dL — ABNORMAL LOW (ref 8.4–10.5)
Chloride: 97 mmol/L (ref 96–112)
Creatinine, Ser: 0.86 mg/dL (ref 0.50–1.10)
GFR calc Af Amer: 67 mL/min — ABNORMAL LOW (ref >90)
GFR calc non Af Amer: 58 mL/min — ABNORMAL LOW (ref >90)
GLUCOSE: 111 mg/dL — AB (ref 70–99)
Potassium: 3.5 mmol/L (ref 3.5–5.1)
SODIUM: 133 mmol/L — AB (ref 135–145)

## 2014-08-10 MED ORDER — WARFARIN SODIUM 2.5 MG PO TABS
2.5000 mg | ORAL_TABLET | Freq: Once | ORAL | Status: AC
  Administered 2014-08-10: 2.5 mg via ORAL
  Filled 2014-08-10: qty 1

## 2014-08-10 MED ORDER — POTASSIUM CHLORIDE CRYS ER 20 MEQ PO TBCR
40.0000 meq | EXTENDED_RELEASE_TABLET | Freq: Four times a day (QID) | ORAL | Status: AC
Start: 2014-08-10 — End: 2014-08-10
  Administered 2014-08-10 (×2): 40 meq via ORAL
  Filled 2014-08-10 (×2): qty 2

## 2014-08-10 MED ORDER — MAGNESIUM SULFATE 2 GM/50ML IV SOLN
2.0000 g | Freq: Once | INTRAVENOUS | Status: AC
  Administered 2014-08-10: 2 g via INTRAVENOUS
  Filled 2014-08-10: qty 50

## 2014-08-10 NOTE — Progress Notes (Signed)
Speech Language Pathology Treatment: Dysphagia  Patient Details Name: Amanda Richardson MRN: [id number] DOB: 02-18-1924 Today's Date: 08/10/2014 Time: 5176-1607 SLP Time Calculation (min) (ACUTE ONLY): 29 min  Assessment / Plan / Recommendation Clinical Impression  F/u after initial bedside swallow evaluation 4/16. Patient alert with significant decreased in SOB. Esophageal w/u recommended initially has not been complete. Per patient, occasional globus noted occurring for years prior to arrival consistent with dx of GER/ possible esophageal involvement. Diagnostic po trials provided by SLP today to assess for need for this to be complete on this admission. Patient with much improvement in overall function, consuming trials of regular solids, thin liquids without overt indication of aspiration as seen previously. Although cannot r/o aspiration related PNA, patient does not have a history of recent recurrent PNAs and appears to be protecting airway at this time. Educated patient and daughters regarding current function and recommendations as well as general safe swallowing and esophageal precautions which may mitigate aspiration risks and increase comfort during po intake. All verbalized understanding. If PNA becomes recurrent in nature and/or symptoms return/worsen, would consider esophageal w/u and/or instrumental testing of swallow. Given s/s of aspiration noted on initial evaluation, will continue to f/u briefly for diagnostic treatment and education.    HPI HPI: 81 yo female adm with increased WOB, fever, cough.  Pt PMH + for CAP, right sided heart failure, mitral valve replacement.  CXR showed increased edema, left effusion, ? LLL and RLL pna.  Pt with event yesterday where her RR was elevated and labored.  Today respiratory status better with dosing of Lasix per MD note.     Pertinent Vitals Pain Assessment: No/denies pain  SLP Plan  Continue with current plan of care    Recommendations Diet  recommendations: Regular;Thin liquid Liquids provided via: Cup;Straw Medication Administration: Whole meds with liquid Supervision: Patient able to self feed Compensations: Slow rate;Small sips/bites;Follow solids with liquid Postural Changes and/or Swallow Maneuvers: Seated upright 90 degrees;Upright 30-60 min after meal              Oral Care Recommendations: Oral care BID Plan: Continue with current plan of care    [place], [place] [phone]  Cyniah Gossard Meryl 08/10/2014, 3:08 PM

## 2014-08-10 NOTE — Progress Notes (Signed)
CSW met with patient & daughters at bedside re: discharge planning. Patient is agreeable to SNF now and has accepted bed offer at [facility]. CSW confirmed with Claiborne Billings at [facility] that they would be able to take patient when ready for discharge.   Clinical Social Work Department CLINICAL SOCIAL WORK PLACEMENT NOTE 08/10/2014  Patient:  Amanda Richardson, Amanda Richardson  Account Number:  [id number] [place] date:  08/04/2014  Clinical Social Worker:  Maryln Manuel  Date/time:  08/08/2014 03:45 PM  Clinical Social Work is seeking post-discharge placement for this patient at the following level of care:   SKILLED NURSING   (*CSW will update this form in Epic as items are completed)   08/08/2014  Patient/family provided with [place] Department of Clinical Social Work's list of facilities offering this level of care within the geographic area requested by the patient (or if unable, by the patient's family).  08/08/2014  Patient/family informed of their freedom to choose among providers that offer the needed level of care, that participate in Medicare, Medicaid or managed care program needed by the patient, have an available bed and are willing to accept the patient.  08/08/2014  Patient/family informed of [facility]' ownership interest in [facility], as well as of the fact that they are under no obligation to receive care at this facility.  PASARR submitted to EDS on 08/08/2014 PASARR number received on 08/08/2014  FL2 transmitted to all facilities in geographic area requested by pt/family on  08/08/2014 FL2 transmitted to all facilities within larger geographic area on   Patient informed that his/her managed care company has contracts with or will negotiate with  certain facilities, including the following:     Patient/family informed of bed offers received:  08/10/2014 Patient chooses bed at [place] Physician recommends and patient chooses bed at    Patient to be  transferred to [place] on   Patient to be transferred to facility by  Patient and family notified of transfer on  Name of family member notified:    The following physician request were entered in Epic:   Additional Comments:    Raynaldo Opitz, [place] Social Worker cell #: [phone]

## 2014-08-10 NOTE — Progress Notes (Signed)
ANTICOAGULATION CONSULT NOTE - FOLLOW UP  Pharmacy Consult for Warfarin Indication: MVR and Afib  Allergies  Allergen Reactions  . Heparin Anaphylaxis    Patient Measurements: Height: 5\' 4"  (162.6 cm) Weight: 138 lb 3.7 oz (62.7 kg) IBW/kg (Calculated) : 54.7  Vital Signs: Temp: 99 F (37.2 C) (04/18 0507) Temp Source: Oral (04/18 0507) BP: 110/56 mmHg (04/18 0909) Pulse Rate: 79 (04/18 0909)   Labs:  Recent Labs  08/08/14 0545 08/08/14 0820 08/08/14 1816 08/08/14 2330 08/09/14 0526 08/10/14 0545  LABPROT 33.8*  --   --   --  28.2* 30.1*  INR 3.30*  --   --   --  2.61* 2.84*  CREATININE  --  0.73  --   --  0.78 0.86  TROPONINI  --   --  0.09* 0.08* 0.07*  --     Estimated Creatinine Clearance: 38.3 mL/min (by C-G formula based on Cr of 0.86).   Medical History: Past Medical History  Diagnosis Date  . H/O mitral valve replacement 2001    mechanical heart valve; family states indication may have been MR from MVP or  rheumatic heart disease  . Thyroid disease   . Hypertension   . CHF (congestive heart failure)   . S/p nephrectomy     left  . Coronary artery disease   . 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 Urinary incontinence   . Atrial fibrillation   . DVT (deep venous thrombosis)     possible in 1990s and does not remember being on blood thinner  . Hypothyroidism   . Chronic anemia     heavy periods  . Glaucoma   . Bullous pemphigoid   . Chronic anticoagulation   . Basal cell carcinoma   . Allergic rhinitis   . Bilateral lower extremity edema     history of venous insufficiency  . Shortness of breath dyspnea    Assessment: 89 YOF on chronic warfarin PTA for history of mechanical MVR (2001) and atrial fibrillation. Present presented 08/04/14 following fall on Monday night, 08/03/14, due to weakness, shortness of breath, and fever. Code sepsis was called. Pharmacy consulted to manage warfarin therapy while inpatient.   Home warfarin  dose reported as 5 mg daily except takes 2.5 mg dose on Mondays, Tuesdays, and Thursdays. Last dose reported as last week (a least two days PTA).   MD discussed with patient's family on 08/04/14 the options of bridging with fondaparinux versus continuing with no bridging agent. Family chose to continue with warfarin therapy without bridging agent.  Today, 08/10/2014: -INR remains therapeutic at 2.84 (goal 2.5-3.5) -08/07/14 CBC: H/H WNL, Hgb 12.4, PLTs slightly low at 140 -Drug-dug interactions- Aspirin 325mg , Ceftriaxone/Azithromycin -08/09/14: RN notes pt has bleeding in one nostril on, MD aware and wishes to continue warfarin. No further bleeding noted today.   Goal of Therapy:  INR 2.5-3.5   Plan:  -Warfarin 2.5mg  PO x 1 tonight -Daily INR, check CBC tomorrow  -Monitor for signs and symptoms of bleeding  Gloriajean Dell, PharmD Candidate    I agree with the above assessment and plan. For discharge, recommend resuming home warfarin dosing. Gretta Arab PharmD, BCPS Pager [phone] 08/10/2014 12:53 PM

## 2014-08-10 NOTE — Progress Notes (Signed)
Subjective: No further episodes. Per daughter, seems to be showing some improvement.   Exam: Filed Vitals:   08/10/14 0507  BP: 141/57  Pulse: 81  Temp: 99 F (37.2 C)  Resp: 28   Gen: In bed, NAD MS: Awake, alert. Not oriented to month or year.  EH:UDJSH, EOMI, VFF Motor: MAEW, but with generalized weakness 4/5.  Sensory:intact to LT   Impression: 81 yo F with AMS in the setting of pneumonia and hospitalization who had a transient episode of unresponsiveness on 04/16. From description, TIA or stroke does not sound likely though cannot be ruled out. If it did represent ischemia, I would not add antiplatelets to her already therapeutic INR. Also, given her generalized weakness, would not favor adding statin therapy at 89. Another possibility would be a partial complex seizure, but with this being her first episode and unclear that it even was a seizure, I would not favor starting an AED at this time. If further events were to occur, then EEG or empiric AED therapy could be considered.   Given that management would not change considerably if this were stroke, I do not feel that MRI would change management. I discussed this with the daughter who expressed understanding.   I also discussed driving and the family has already decided to revoke her driving privileges.   I suspect her continued confusion is hospital related delirium.   Recommendations: 1) Agree with cessation of driving.  2) Will d/c asa given theraputic INR 3) will d/c MRI given that it will not change management.  4) please call with any further questions or concerns.   Roland Rack, MD [facility] [phone]  If 7pm- 7am, please page neurology on call as listed in [place].

## 2014-08-10 NOTE — Progress Notes (Signed)
[facility] PROGRESS NOTE   Amanda Richardson VHQ:[id number] DOB: 07/14/1923 DOA: 08/04/2014 PCP: Merrilee Seashore, MD  HPI/Subjective: Seen with daughter at bedside, denies any complaints today. Breathing improving, not was on oxygen when I saw her she is about to have her physical therapy. Discussed with Dr. Leonel Ramsay, recommended to cancel the MRI, as it will not change management. Anticipate discharge in the morning to skilled nursing facility.  Assessment/Plan: Principal Problem:   CAP (community acquired pneumonia) Active Problems:   H/O mechanical mitral valve replacement 2001   S/p nephrectomy   Coronary artery disease   Chronic atrial fibrillation   Hypothyroidism   Bullous pemphigoid   Chronic anticoagulation   Acute on chronic diastolic heart failure   Subtherapeutic anticoagulation    Sepsis Present on admission, temperature of 104.9 and respiratory rate 28 in the presence of pneumonia. This is likely secondary to pneumonia. Was started on broad-spectrum antibiotics and IV fluids. Sputum blood cultures showed no growth, continue antibiotics for total of 7 days.  CAP Community-acquired pneumonia, right lung infiltrates per CXR. Patient started on vancomycin, ceftriaxone and azithromycin. Continue current supportive management with bronchodilators, mucolytics, antitussives and oxygen. Feels better today, as the x-ray has right lower lobe infiltrates, I'll check for swallowing to rule out aspiration.  Acute on chronic diastolic CHF Continue home medications, continue digoxin and atenolol.  Was on IV fluids briefly on admission, probably caused acute on chronic diastolic CHF, IV fluids discontinued. Patient is started on Lasix 40 IV twice a day, monitor intake/output. Renal function monitored daily, creatinine is 0.8  Unresponsiveness Patient was in bed, had episode of unresponsiveness with her eyes wide open but she does not follow commands. CT scan  of the head repeated because of the recent fall looking for delayed bleeding, no acute events. Per neurology this could be partial complex seizure, MRI canceled no change in management. No EEG for now if this is happening again consider EEG and anticonvulsants.  Fall Likely secondary to generalized weakness from pneumonia, no loss of consciousness. CT scan of the head/C-spine did not show any evidence of fractures of bleeding. Telemetry did not show any tachyarrhythmias.  Chronic anticoagulation/mechanical mitral valve Patient is on warfarin for mechanical mitral valve, INR is 1.66. Per admitting physician patient and family declined use of fondaparinux as bridge, elected to go along with warfarin and ASA. Pharmacy is dosing the Coumadin, INR today is 2.8.  Hypothyroidism Continue Synthroid.  Slightly elevated INR INR is slightly elevated, cereals are 0.09, 0.08 and 0.07. Likely the slight elevation secondary to acute diastolic CHF, patient did not mention any chest pain.   Code Status: DNR Family Communication: Plan discussed with the patient. Disposition Plan: Remains inpatient Diet: Diet 2 gram sodium Room service appropriate?: Yes; Fluid consistency:: Thin; Fluid restriction:: 1500 mL Fluid  Consultants:  None  Procedures:  None  Antibiotics:  Rocephin, azithromycin.  Vancomycin discontinued on 4/15   Objective: Filed Vitals:   08/10/14 0909  BP: 110/56  Pulse: 79  Temp:   Resp:     Intake/Output Summary (Last 24 hours) at 08/10/14 1157 Last data filed at 08/10/14 1012  Gross per 24 hour  Intake    660 ml  Output   3550 ml  Net  -2890 ml   Filed Weights   08/07/14 0618 08/08/14 0455 08/09/14 0500  Weight: 71.4 kg (157 lb 6.5 oz) 67 kg (147 lb 11.3 oz) 62.7 kg (138 lb 3.7 oz)    Exam: General: Alert and awake, oriented  x3, not in any acute distress. HEENT: anicteric sclera, pupils reactive to light and accommodation, EOMI CVS: S1-S2 clear, no  murmur rubs or gallops Chest: clear to auscultation bilaterally, no wheezing, rales or rhonchi Abdomen: soft nontender, nondistended, normal bowel sounds, no organomegaly Extremities: no cyanosis, clubbing or edema noted bilaterally Neuro: Cranial nerves II-XII intact, no focal neurological deficits  Data Reviewed: Basic Metabolic Panel:  Recent Labs Lab 08/06/14 0339 08/07/14 0432 08/08/14 0820 08/09/14 0526 08/10/14 0545  NA 136 134* 134* 136 133*  K 3.9 3.9 3.5 3.5 3.5  CL 108 105 99 100 97  CO2 23 24 27 30 31   GLUCOSE 101* 108* 106* 108* 111*  BUN 24* 20 16 18 21   CREATININE 0.82 0.74 0.73 0.78 0.86  CALCIUM 7.7* 7.8* 8.0* 7.9* 7.8*   Liver Function Tests:  Recent Labs Lab 08/04/14 1658 08/05/14 0330 08/07/14 0432 08/09/14 0526  AST 118* 107* 65* 45*  ALT 40* 42* 47* 39*  ALKPHOS 90 91 90 125*  BILITOT 2.1* 1.2 0.9 0.8  PROT 6.5 5.3* 5.3* 5.4*  ALBUMIN 3.5 2.9* 2.8* 2.7*   No results for input(s): LIPASE, AMYLASE in the last 168 hours.  Recent Labs Lab 08/08/14 1816  AMMONIA 62*   CBC:  Recent Labs Lab 08/04/14 1658 08/05/14 0330 08/07/14 0432  WBC 8.4 5.3 6.9  NEUTROABS 7.0 3.5  --   HGB 14.1 11.9* 12.4  HCT 40.6 35.7* 36.7  MCV 89.6 91.8 90.4  PLT 126* 109* 140*   Cardiac Enzymes:  Recent Labs Lab 08/04/14 1658 08/08/14 1816 08/08/14 2330 08/09/14 0526  CKTOTAL 2709*  --   --   --   TROPONINI  --  0.09* 0.08* 0.07*   BNP (last 3 results) No results for input(s): BNP in the last 8760 hours.  ProBNP (last 3 results) No results for input(s): PROBNP in the last 8760 hours.  CBG:  Recent Labs Lab 08/08/14 1713  GLUCAP 121*    Micro Recent Results (from the past 240 hour(s))  Urine culture     Status: None   Collection Time: 08/04/14  4:08 PM  Result Value Ref Range Status   Specimen Description URINE, CATHETERIZED  Final   Special Requests NONE  Final   Colony Count NO GROWTH Performed at [facility]   Final    Culture NO GROWTH Performed at [facility]   Final   Report Status 08/05/2014 FINAL  Final  MRSA PCR Screening     Status: None   Collection Time: 08/04/14  4:08 PM  Result Value Ref Range Status   MRSA by PCR NEGATIVE NEGATIVE Final    Comment:        The GeneXpert MRSA Assay (FDA approved for NASAL specimens only), is one component of a comprehensive MRSA colonization surveillance program. It is not intended to diagnose MRSA infection nor to guide or monitor treatment for MRSA infections.   Blood Culture (routine x 2)     Status: None   Collection Time: 08/04/14  4:53 PM  Result Value Ref Range Status   Specimen Description LEFT ANTECUBITAL  Final   Special Requests BOTTLES DRAWN AEROBIC AND ANAEROBIC 5CC  Final   Culture   Final    NO GROWTH 5 DAYS Performed at [facility]    Report Status 08/10/2014 FINAL  Final  Blood Culture (routine x 2)     Status: None   Collection Time: 08/04/14  4:56 PM  Result Value Ref Range Status  Specimen Description BLOOD RIGHT FOREARM  Final   Special Requests BOTTLES DRAWN AEROBIC AND ANAEROBIC 5CC  Final   Culture   Final    NO GROWTH 5 DAYS Performed at [facility]    Report Status 08/10/2014 FINAL  Final     Studies: Ct Head Wo Contrast  08/08/2014   CLINICAL DATA:  Mental status changes, not following commands  EXAM: CT HEAD WITHOUT CONTRAST  TECHNIQUE: Contiguous axial images were obtained from the base of the skull through the vertex without intravenous contrast.  COMPARISON:  08/04/2014  FINDINGS: Severe diffuse atrophy. Low attenuation in the deep white matter. No hydrocephalus. No evidence of cortical infarct hemorrhage or extra-axial fluid. No intracranial mass. Chronic lacunar infarct right caudate head stable. Calvarium intact. Visualized portions of the paranasal sinuses clear.  IMPRESSION: No acute findings.  Chronic atrophy.   Electronically Signed   By: Skipper Cliche M.D.   On: 08/08/2014  18:15    Scheduled Meds: . antiseptic oral rinse  7 mL Mouth Rinse BID  . atenolol  50 mg Oral Daily  . azithromycin  500 mg Intravenous Q24H  . budesonide (PULMICORT) nebulizer solution  0.25 mg Nebulization Once  . cefTRIAXone (ROCEPHIN)  IV  1 g Intravenous Q24H  . digoxin  0.0625 mg Oral Daily  . furosemide  40 mg Intravenous Q12H  . guaiFENesin  1,200 mg Oral BID  . latanoprost  1 drop Both Eyes QHS  . levothyroxine  50 mcg Oral QAC breakfast  . lisinopril  10 mg Oral Daily  . potassium chloride  40 mEq Oral Q6H  . Warfarin - Pharmacist Dosing Inpatient   Does not apply q1800   Continuous Infusions:       Time spent: 35 minutes    [facility]  [facility] Pager [phone] If 7PM-7AM, please contact night-coverage at [email or web address], password [facility] 08/10/2014, 11:57 AM  LOS: 6 days

## 2014-08-10 NOTE — Progress Notes (Signed)
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    PT Comments    Progressing with mobility and activity tolerance. Able to tolerate ambulation on today. Pt is unsteady and at risk for falls. Continue to recommend ST rehab at SNF to improve strength, activity tolerance, balance, and safety.   Follow Up Recommendations  SNF;Supervision/Assistance - 24 hour     Equipment Recommendations  Rolling walker with 5" wheels    Recommendations for Other Services       Precautions / Restrictions Precautions Precautions: Fall Precaution Comments: monitor RR and SpO2 Restrictions Weight Bearing Restrictions: No    Mobility  Bed Mobility Overal bed mobility: Needs Assistance Bed Mobility: Supine to Sit     Supine to sit: [facility] elevated;Min assist     General bed mobility comments: assist to fully get to EOB. Increased time. VCs for instruction  Transfers Overall transfer level: Needs assistance Equipment used: Rolling walker (2 wheeled) Transfers: Sit to/from Stand Sit to Stand: Min assist         General transfer comment: Assist to rise, stabilize, control descent. VCs safety, technique, hand placement. Weight shifted posteriorly with initial standing so stood in front of bed for a short while before taking any steps  Ambulation/Gait Ambulation/Gait assistance: Min assist Ambulation Distance (Feet): 50 Feet Assistive device: Rolling walker (2 wheeled) Gait Pattern/deviations: Step-through pattern;Decreased stride length;Trunk flexed     General Gait Details: Assist to stabilize pt  and maneuver with walker. Unsteady. O2 sats 95% on RA after ambulation   Stairs            Wheelchair Mobility    Modified Rankin (Stroke Patients Only)       Balance           Standing balance support: Bilateral upper extremity supported;During functional activity Standing balance-Leahy Scale: Poor Standing balance comment: needs RW                    Cognition Arousal/Alertness: Awake/alert Behavior During Therapy: WFL for tasks assessed/performed Overall Cognitive Status: Impaired/Different from baseline Area of Impairment: Orientation;Following commands Orientation Level: Place;Situation     Following Commands: Follows one step commands consistently            Exercises      General Comments        Pertinent Vitals/Pain Pain Assessment: No/denies pain    Home Living                      Prior Function            PT Goals (current goals can now be found in the care plan section) Progress towards PT goals: Progressing toward goals    Frequency  Min 3X/week    PT Plan Current plan remains appropriate    Co-evaluation             End of Session Equipment Utilized During Treatment: Gait belt Activity Tolerance: Patient tolerated treatment well Patient left: in chair;with call bell/phone within reach;with chair alarm set;with family/visitor present     Time: 0950-1011 PT Time Calculation (min) (ACUTE ONLY): 21 min  Charges:  $Gait Training: 8-22 mins  G Codes:      Weston Anna, MPT Pager: [phone]

## 2014-08-11 DIAGNOSIS — R740 Nonspecific elevation of levels of transaminase and lactic acid dehydrogenase [LDH]: Secondary | ICD-10-CM

## 2014-08-11 LAB — COMPREHENSIVE METABOLIC PANEL
ALBUMIN: 2.6 g/dL — AB (ref 3.5–5.2)
ALK PHOS: 128 U/L — AB (ref 39–117)
ALT: 34 U/L (ref 0–35)
AST: 43 U/L — ABNORMAL HIGH (ref 0–37)
Anion gap: 6 (ref 5–15)
BUN: 24 mg/dL — ABNORMAL HIGH (ref 6–23)
CO2: 30 mmol/L (ref 19–32)
Calcium: 7.8 mg/dL — ABNORMAL LOW (ref 8.4–10.5)
Chloride: 98 mmol/L (ref 96–112)
Creatinine, Ser: 0.88 mg/dL (ref 0.50–1.10)
GFR calc Af Amer: 66 mL/min — ABNORMAL LOW (ref >90)
GFR calc non Af Amer: 57 mL/min — ABNORMAL LOW (ref >90)
Glucose, Bld: 100 mg/dL — ABNORMAL HIGH (ref 70–99)
Potassium: 3.5 mmol/L (ref 3.5–5.1)
SODIUM: 134 mmol/L — AB (ref 135–145)
TOTAL PROTEIN: 5.2 g/dL — AB (ref 6.0–8.3)
Total Bilirubin: 0.8 mg/dL (ref 0.3–1.2)

## 2014-08-11 LAB — CBC
HCT: 39 % (ref 36.0–46.0)
Hemoglobin: 12.9 g/dL (ref 12.0–15.0)
MCH: 30 pg (ref 26.0–34.0)
MCHC: 33.1 g/dL (ref 30.0–36.0)
MCV: 90.7 fL (ref 78.0–100.0)
Platelets: 262 10*3/uL (ref 150–400)
RBC: 4.3 MIL/uL (ref 3.87–5.11)
RDW: 13.9 % (ref 11.5–15.5)
WBC: 7.1 10*3/uL (ref 4.0–10.5)

## 2014-08-11 LAB — DIGOXIN LEVEL: Digoxin Level: 0.4 ng/mL — ABNORMAL LOW (ref 0.8–2.0)

## 2014-08-11 LAB — PROTIME-INR
INR: 2.75 — ABNORMAL HIGH (ref 0.00–1.49)
Prothrombin Time: 29.4 seconds — ABNORMAL HIGH (ref 11.6–15.2)

## 2014-08-11 LAB — MAGNESIUM: MAGNESIUM: 2 mg/dL (ref 1.5–2.5)

## 2014-08-11 LAB — BRAIN NATRIURETIC PEPTIDE: B Natriuretic Peptide: 277.7 pg/mL — ABNORMAL HIGH (ref 0.0–100.0)

## 2014-08-11 MED ORDER — FUROSEMIDE 40 MG PO TABS: 40.0000 mg | ORAL_TABLET | Freq: Every day | ORAL | Status: DC

## 2014-08-11 NOTE — Progress Notes (Signed)
Report called to Diplomatic Services operational officer at [facility].  Transportation has been arranged.  Ready for d/c.

## 2014-08-11 NOTE — Progress Notes (Signed)
ANTICOAGULATION CONSULT NOTE - FOLLOW UP  Pharmacy Consult for Warfarin Indication: MVR and Afib  Allergies  Allergen Reactions  . Heparin Anaphylaxis    Patient Measurements: Height: 5\' 4"  (162.6 cm) Weight: 133 lb 6.1 oz (60.5 kg) IBW/kg (Calculated) : 54.7  Vital Signs: Temp: 98.7 F (37.1 C) (04/19 0500) Temp Source: Oral (04/19 0500) BP: 125/49 mmHg (04/19 0500) Pulse Rate: 76 (04/19 0500)   Labs:  Recent Labs  08/08/14 1816 08/08/14 2330 08/09/14 0526 08/10/14 0545 08/11/14 0510  HGB  --   --   --   --  12.9  HCT  --   --   --   --  39.0  PLT  --   --   --   --  262  LABPROT  --   --  28.2* 30.1* 29.4*  INR  --   --  2.61* 2.84* 2.75*  CREATININE  --   --  0.78 0.86 0.88  TROPONINI 0.09* 0.08* 0.07*  --   --     Estimated Creatinine Clearance: 37.4 mL/min (by C-G formula based on Cr of 0.88).   Medical History: Past Medical History  Diagnosis Date  . H/O mitral valve replacement 2001    mechanical heart valve; family states indication may have been MR from MVP or  rheumatic heart disease  . Thyroid disease   . Hypertension   . CHF (congestive heart failure)   . S/p nephrectomy     left  . Coronary artery disease   . 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 Urinary incontinence   . Atrial fibrillation   . DVT (deep venous thrombosis)     possible in 1990s and does not remember being on blood thinner  . Hypothyroidism   . Chronic anemia     heavy periods  . Glaucoma   . Bullous pemphigoid   . Chronic anticoagulation   . Basal cell carcinoma   . Allergic rhinitis   . Bilateral lower extremity edema     history of venous insufficiency  . Shortness of breath dyspnea    Assessment: 89 YOF on chronic warfarin PTA for history of mechanical MVR (2001) and atrial fibrillation. Present presented 08/04/14 following fall on Monday night, 08/03/14, due to weakness, shortness of breath, and fever. Code sepsis was called. Pharmacy consulted to  manage warfarin therapy while inpatient.   Home warfarin dose reported as 5 mg daily except takes 2.5 mg dose on Mondays, Tuesdays, and Thursdays. Last dose reported as last week (a least two days PTA).   Today, 08/11/2014: -INR remains therapeutic at 2.75 (goal 2.5-3.5) -CBC: H/H WNL, Hgb 12.9, PLTs have increased to WNL -Drug-dug interactions -  Ceftriaxone/Azithromycin - No further bleeding per RN  Goal of Therapy:  INR 2.5-3.5   Plan:  - For discharge recommend to continue PTA regimen of warfarin 5mg  daily except for 2.5mg  on Mondays, Tuesdays, and Thursdays -If patient remains inpatient give warfarin 5mg  PO x 1 tonight at 1800 -Follow up for INR check by Monday 08/17/14  Gloriajean Dell, PharmD Candidate   Gretta Arab PharmD, BCPS Pager [phone] 08/11/2014 10:51 AM

## 2014-08-11 NOTE — Discharge Summary (Signed)
Physician Discharge Summary  Amanda Richardson JOI:[id number] DOB: May 30, 1923 DOA: 08/04/2014  PCP: Merrilee Seashore, MD  Admit date: 08/04/2014 Discharge date: 08/11/2014  Time spent: 40 minutes  Recommendations for Outpatient Follow-up:  1. Follow-up with nursing home M.D. 2. Check BMP and INR in 2-3 days  Discharge Diagnoses:  Principal Problem:   CAP (community acquired pneumonia) Active Problems:   H/O mechanical mitral valve replacement 2001   S/p nephrectomy   Coronary artery disease   Chronic atrial fibrillation   Hypothyroidism   Bullous pemphigoid   Chronic anticoagulation   Acute on chronic diastolic heart failure   Subtherapeutic anticoagulation   Transaminitis   Discharge Condition: Stable  Diet recommendation: Heart healthy  Filed Weights   08/08/14 0455 08/09/14 0500 08/11/14 0500  Weight: 67 kg (147 lb 11.3 oz) 62.7 kg (138 lb 3.7 oz) 60.5 kg (133 lb 6.1 oz)    History of present illness:  Amanda Richardson is a 81 y.o. 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 The patient is presenting with complaints of cough and shortness of breath ongoing since last one week. Patient has been feeling generalized weakness since last one day progressively worsening. Sundays she had some cough without any active mucus production. She also felt febrile that day. She has been feeling weak and progressively worsening since then. On Monday she had a fall when she was leaning forward. She has been living alone and has been on the ground for roughly 12 hours before her family found her on the floor. She denied any unconsciousness. Tuesday she continues to remain progressively worsening weakness and therefore they brought her to the hospital. No nausea no vomiting no abdominal pain no chest  pain no constipation no diarrhea reported. No blood in the urine reported. She complains of some burning urination which is chronic. She has chronic leg swelling which is improving. She denies any leg tenderness. She has missed her dose of warfarin on Monday. Recently her warfarin level has been adjusted as her INR was elevated. She has a history of an allergic reaction with heparin and 2004 when she was admitted for small bowel obstruction at which time when they put her on heparin, as per family "she almost died"and they had to take her to ICU and at the time of the discharge she were informed that "do not give her heparin again".  The patient is coming from home. And at her baseline independent for most of her ADL.   Hospital Course:   Sepsis Present on admission, temperature of 104.9 and respiratory rate 28 in the presence of pneumonia. This is likely secondary to pneumonia. Started on broad-spectrum antibiotics and IV fluids. Sputum blood cultures showed no growth, finished 7 days of antibiotics prior to discharge.  CAP Community-acquired pneumonia, right lung infiltrates per CXR. Patient started on vancomycin, ceftriaxone and azithromycin. Continue current supportive management with bronchodilators, mucolytics, antitussives and oxygen. Felt much better, treated with azithromycin and ceftriaxone for total of 7 days. Because of the RLL infiltrates patient was checked for aspiration, SOB findings no evidence of dysphagia/aspiration.  Acute on chronic diastolic CHF Continue home medications, continue digoxin and atenolol.  Was on IV fluids briefly on admission, probably caused acute on chronic diastolic CHF, IV fluids discontinued. Patient is started on Lasix 40 IV twice a day, monitor intake/output. On discharge placed on Lasix 40 mg daily. Patient reported that  she was on Lasix before but she was taken off because bullous pemphigoid. If patient developed blisters and flareup of her  pemphigoid, consider switching to another loop diuretic.  Unresponsiveness Patient was in bed, had episode of unresponsiveness with her eyes wide open but she does not follow commands. CT scan of the head repeated because of the recent fall looking for delayed bleeding, no acute events. Per neurology this could be partial complex seizure, MRI canceled no change in management. No EEG for now if this is happens again consider EEG and anticonvulsants.  Fall Likely secondary to generalized weakness from pneumonia, no loss of consciousness. CT scan of the head/C-spine did not show any evidence of fractures of bleeding. Telemetry did not show any tachyarrhythmias.  Chronic anticoagulation/mechanical mitral valve Patient is on warfarin for mechanical mitral valve, INR is 1.66. Per admitting physician patient and family declined use of fondaparinux as bridge, elected to go along with warfarin and ASA. Pharmacy is dosing the Coumadin, INR today is 2.7.  Hypothyroidism Continue Synthroid.  Slightly elevated INR INR is slightly elevated, cereals are 0.09, 0.08 and 0.07. Likely the slight elevation secondary to acute diastolic CHF, patient did not mention any chest pain.   Procedures:  None  Consultations:  None  Discharge Exam: Filed Vitals:   08/11/14 0500  BP: 125/49  Pulse: 76  Temp: 98.7 F (37.1 C)  Resp: 24   General: Alert and awake, oriented x3, not in any acute distress. HEENT: anicteric sclera, pupils reactive to light and accommodation, EOMI CVS: S1-S2 clear, no murmur rubs or gallops Chest: clear to auscultation bilaterally, no wheezing, rales or rhonchi Abdomen: soft nontender, nondistended, normal bowel sounds, no organomegaly Extremities: no cyanosis, clubbing or edema noted bilaterally Neuro: Cranial nerves II-XII intact, no focal neurological deficits  Discharge Instructions   Discharge Instructions    Diet - low sodium heart healthy    Complete by:  As  directed      Increase activity slowly    Complete by:  As directed           Current Discharge Medication List    START taking these medications   Details  furosemide (LASIX) 40 MG tablet Take 1 tablet (40 mg total) by mouth daily.      CONTINUE these medications which have NOT CHANGED   Details  atenolol (TENORMIN) 50 MG tablet Take 50 mg by mouth daily.    calcium-vitamin D (OSCAL WITH D) 500-200 MG-UNIT per tablet Take 1 tablet by mouth daily.    Cholecalciferol (VITAMIN D-3) 1000 UNITS CAPS Take 2 capsules by mouth daily.     clobetasol ointment (TEMOVATE) 5.68 % Apply 1 application topically as needed (blisters for bulous pemphigoid).     COUMADIN 5 MG tablet Take 5 mg by mouth as directed. Monday, Tuesday and Thursday take 2.5mg  Saturday, Sunday, Wednesday, and Friday= 5mg  Refills: 8    digoxin (LANOXIN) 0.125 MG tablet Take 0.0625 mg by mouth daily.     latanoprost (XALATAN) 0.005 % ophthalmic solution Place 1 drop into both eyes at bedtime.    levothyroxine (SYNTHROID, LEVOTHROID) 50 MCG tablet Take 50 mcg by mouth daily. Refills: 6    lisinopril (PRINIVIL,ZESTRIL) 10 MG tablet Take 10 mg by mouth daily.  Refills: 3    Multiple Vitamin (MULTIVITAMIN WITH MINERALS) TABS Take 1 tablet by mouth daily.      STOP taking these medications     amoxicillin (AMOXIL) 500 MG capsule  Allergies  Allergen Reactions  . Heparin Anaphylaxis   Follow-up Information    Follow up with Lorretta Harp, MD In 1 week.   Specialty:  Cardiology   Contact information:   [address] [place] [address] [phone]        The results of significant diagnostics from this hospitalization (including imaging, microbiology, ancillary and laboratory) are listed below for reference.    Significant Diagnostic Studies: Ct Head Wo Contrast  08/08/2014   CLINICAL DATA:  Mental status changes, not following commands  EXAM: CT HEAD WITHOUT CONTRAST   TECHNIQUE: Contiguous axial images were obtained from the base of the skull through the vertex without intravenous contrast.  COMPARISON:  08/04/2014  FINDINGS: Severe diffuse atrophy. Low attenuation in the deep white matter. No hydrocephalus. No evidence of cortical infarct hemorrhage or extra-axial fluid. No intracranial mass. Chronic lacunar infarct right caudate head stable. Calvarium intact. Visualized portions of the paranasal sinuses clear.  IMPRESSION: No acute findings.  Chronic atrophy.   Electronically Signed   By: Skipper Cliche M.D.   On: 08/08/2014 18:15   Ct Head Wo Contrast  08/04/2014   CLINICAL DATA:  [place] Monday secondary weakness. Headache. On blood thinners.  EXAM: CT HEAD WITHOUT CONTRAST  CT CERVICAL SPINE WITHOUT CONTRAST  TECHNIQUE: Multidetector CT imaging of the head and cervical spine was performed following the standard protocol without intravenous contrast. Multiplanar CT image reconstructions of the cervical spine were also generated.  COMPARISON:  None  FINDINGS: CT HEAD FINDINGS  Sinuses/Soft tissues: No significant soft tissue swelling. Minimal motion degradation. No skull fracture. Clear paranasal sinuses and mastoid air cells.  Intracranial: Mild for age low density in the periventricular white matter likely related to small vessel disease. Expected cerebral atrophy. Suspect a remote lacunar infarct in the right basal ganglia. No mass lesion, hemorrhage, hydrocephalus, acute infarct, intra-axial, or extra-axial fluid collection.  CT CERVICAL SPINE FINDINGS  Spinal visualization through the bottom of T2. Mild motion degradation. Prevertebral soft tissues are within normal limits. Carotid atherosclerosis, worse on the left. No apical pneumothorax. Patient is oblique in the scanner. Relative rotation of C1 relative to C2 is felt to be positional. Grossly maintained vertebral body height and alignment. Multilevel facet arthropathy.  IMPRESSION: 1.  No acute intracranial  abnormality.  Minimal motion degradation. 2. Motion degraded evaluation of the cervical spine. No gross fracture or subluxation identified. If symptoms warrant, repeat should be considered when patient is able to hold still.   Electronically Signed   By: Abigail Miyamoto M.D.   On: 08/04/2014 20:14   Ct Cervical Spine Wo Contrast  08/04/2014   CLINICAL DATA:  [place] Monday secondary weakness. Headache. On blood thinners.  EXAM: CT HEAD WITHOUT CONTRAST  CT CERVICAL SPINE WITHOUT CONTRAST  TECHNIQUE: Multidetector CT imaging of the head and cervical spine was performed following the standard protocol without intravenous contrast. Multiplanar CT image reconstructions of the cervical spine were also generated.  COMPARISON:  None  FINDINGS: CT HEAD FINDINGS  Sinuses/Soft tissues: No significant soft tissue swelling. Minimal motion degradation. No skull fracture. Clear paranasal sinuses and mastoid air cells.  Intracranial: Mild for age low density in the periventricular white matter likely related to small vessel disease. Expected cerebral atrophy. Suspect a remote lacunar infarct in the right basal ganglia. No mass lesion, hemorrhage, hydrocephalus, acute infarct, intra-axial, or extra-axial fluid collection.  CT CERVICAL SPINE FINDINGS  Spinal visualization through the bottom of T2. Mild motion degradation. Prevertebral soft tissues  are within normal limits. Carotid atherosclerosis, worse on the left. No apical pneumothorax. Patient is oblique in the scanner. Relative rotation of C1 relative to C2 is felt to be positional. Grossly maintained vertebral body height and alignment. Multilevel facet arthropathy.  IMPRESSION: 1.  No acute intracranial abnormality.  Minimal motion degradation. 2. Motion degraded evaluation of the cervical spine. No gross fracture or subluxation identified. If symptoms warrant, repeat should be considered when patient is able to hold still.   Electronically Signed   By: Abigail Miyamoto M.D.   On:  08/04/2014 20:14   Dg Chest Port 1 View  08/07/2014   CLINICAL DATA:  Shortness of breath and cough  EXAM: PORTABLE CHEST - 1 VIEW  COMPARISON:  August 04, 2014  FINDINGS: There is a new pleural effusion on the left with left lower lobe consolidation, new. There is extensive airspace consolidation and edema throughout the right lung, slightly increased. Heart is enlarged with pulmonary venous hypertension. Patient is status post mitral valve replacement. No pneumothorax. No adenopathy.  IMPRESSION: Congestive heart failure, with increased edema and new left effusion. Areas of consolidation on the right may represent superimposed pneumonia. Left lower lobe consolidation is also concerning for pneumonia. Both congestive heart failure and pneumonia may exist concurrently.   Electronically Signed   By: Lowella Grip III M.D.   On: 08/07/2014 15:41   Dg Chest Port 1 View  08/04/2014   CLINICAL DATA:  Shortness of breath, fell Monday night due to weakness, fever, history CHF, hypertension, atrial fibrillation, mitral valve disease  EXAM: PORTABLE CHEST - 1 VIEW  COMPARISON:  Portable exam 1713 hours without priors for comparison.  FINDINGS: Enlargement of cardiac silhouette post MVR.  Calcified tortuous aorta.  Pulmonary vascularity normal.  Significant infiltrates identified RIGHT middle and RIGHT lower lobes, likely also RIGHT upper lobe.  LEFT lung grossly clear.  Questionable small RIGHT pleural effusion.  No LEFT pleural effusion or pneumothorax.  Bones demineralized.  IMPRESSION: Enlargement of cardiac silhouette with vascular congestion post MVR.  RIGHT lung infiltrates question pneumonia.   Electronically Signed   By: Lavonia Dana M.D.   On: 08/04/2014 17:38   Dg Chest Port 1v Same Day  08/10/2014   CLINICAL DATA:  Congestive heart failure.  Shortness of breath.  EXAM: PORTABLE CHEST - 1 VIEW SAME DAY  COMPARISON:  08/07/2014  FINDINGS: Pulmonary edema on the left has almost resolved. Pulmonary edema on  the right has improved. Persistent bilateral moderate pleural effusions. Cardiomegaly. Mitral valve prosthesis. Pulmonary vascularity is now normal.  IMPRESSION: Improving congestive heart failure. Persistent moderate effusions. Slight residual pulmonary edema.   Electronically Signed   By: Lorriane Shire M.D.   On: 08/10/2014 13:12    Microbiology: Recent Results (from the past 240 hour(s))  Urine culture     Status: None   Collection Time: 08/04/14  4:08 PM  Result Value Ref Range Status   Specimen Description URINE, CATHETERIZED  Final   Special Requests NONE  Final   Colony Count NO GROWTH Performed at [facility]   Final   Culture NO GROWTH Performed at [facility]   Final   Report Status 08/05/2014 FINAL  Final  MRSA PCR Screening     Status: None   Collection Time: 08/04/14  4:08 PM  Result Value Ref Range Status   MRSA by PCR NEGATIVE NEGATIVE Final    Comment:        The GeneXpert MRSA Assay (FDA approved for  NASAL specimens only), is one component of a comprehensive MRSA colonization surveillance program. It is not intended to diagnose MRSA infection nor to guide or monitor treatment for MRSA infections.   Blood Culture (routine x 2)     Status: None   Collection Time: 08/04/14  4:53 PM  Result Value Ref Range Status   Specimen Description LEFT ANTECUBITAL  Final   Special Requests BOTTLES DRAWN AEROBIC AND ANAEROBIC 5CC  Final   Culture   Final    NO GROWTH 5 DAYS Performed at [facility]    Report Status 08/10/2014 FINAL  Final  Blood Culture (routine x 2)     Status: None   Collection Time: 08/04/14  4:56 PM  Result Value Ref Range Status   Specimen Description BLOOD RIGHT FOREARM  Final   Special Requests BOTTLES DRAWN AEROBIC AND ANAEROBIC 5CC  Final   Culture   Final    NO GROWTH 5 DAYS Performed at [facility]    Report Status 08/10/2014 FINAL  Final     Labs: Basic Metabolic Panel:  Recent Labs Lab  08/07/14 0432 08/08/14 0820 08/09/14 0526 08/10/14 0545 08/11/14 0510  NA 134* 134* 136 133* 134*  K 3.9 3.5 3.5 3.5 3.5  CL 105 99 100 97 98  CO2 24 27 30 31 30   GLUCOSE 108* 106* 108* 111* 100*  BUN 20 16 18 21  24*  CREATININE 0.74 0.73 0.78 0.86 0.88  CALCIUM 7.8* 8.0* 7.9* 7.8* 7.8*  MG  --   --   --   --  2.0   Liver Function Tests:  Recent Labs Lab 08/04/14 1658 08/05/14 0330 08/07/14 0432 08/09/14 0526 08/11/14 0510  AST 118* 107* 65* 45* 43*  ALT 40* 42* 47* 39* 34  ALKPHOS 90 91 90 125* 128*  BILITOT 2.1* 1.2 0.9 0.8 0.8  PROT 6.5 5.3* 5.3* 5.4* 5.2*  ALBUMIN 3.5 2.9* 2.8* 2.7* 2.6*   No results for input(s): LIPASE, AMYLASE in the last 168 hours.  Recent Labs Lab 08/08/14 1816  AMMONIA 62*   CBC:  Recent Labs Lab 08/04/14 1658 08/05/14 0330 08/07/14 0432 08/11/14 0510  WBC 8.4 5.3 6.9 7.1  NEUTROABS 7.0 3.5  --   --   HGB 14.1 11.9* 12.4 12.9  HCT 40.6 35.7* 36.7 39.0  MCV 89.6 91.8 90.4 90.7  PLT 126* 109* 140* 262   Cardiac Enzymes:  Recent Labs Lab 08/04/14 1658 08/08/14 1816 08/08/14 2330 08/09/14 0526  CKTOTAL 2709*  --   --   --   TROPONINI  --  0.09* 0.08* 0.07*   BNP: BNP (last 3 results)  Recent Labs  08/11/14 0510  BNP 277.7*    ProBNP (last 3 results) No results for input(s): PROBNP in the last 8760 hours.  CBG:  Recent Labs Lab 08/08/14 1713  GLUCAP 121*       Signed:  Wiley Flicker A  [facility] 08/11/2014, 11:19 AM

## 2014-08-11 NOTE — Progress Notes (Signed)
Patient is set to discharge to [facility] today. Patient & daughter at bedside aware. Discharge packet given to RN, Deidre Ala. [facility] called for transport to pickup at 3:00pm.    Clinical Social Work Department CLINICAL SOCIAL WORK PLACEMENT NOTE 08/11/2014  Patient:  Amanda Richardson, Amanda Richardson  Account Number:  [id number] Admit date:  08/04/2014  Clinical Social Worker:  Maryln Manuel  Date/time:  08/08/2014 03:45 PM  Clinical Social Work is seeking post-discharge placement for this patient at the following level of care:   [facility]   (*CSW will update this form in Epic as items are completed)   08/08/2014  Patient/family provided with [facility] Department of Clinical Social Work's list of facilities offering this level of care within the geographic area requested by the patient (or if unable, by the patient's family).  08/08/2014  Patient/family informed of their freedom to choose among providers that offer the needed level of care, that participate in Medicare, Medicaid or managed care program needed by the patient, have an available bed and are willing to accept the patient.  08/08/2014  Patient/family informed of [facility]' ownership interest in [facility], as well as of the fact that they are under no obligation to receive care at this facility.  PASARR submitted to EDS on 08/08/2014 PASARR number received on 08/08/2014  FL2 transmitted to all facilities in geographic area requested by pt/family on  08/08/2014 FL2 transmitted to all facilities within larger geographic area on   Patient informed that his/her managed care company has contracts with or will negotiate with  certain facilities, including the following:     Patient/family informed of bed offers received:  08/10/2014 Patient chooses bed at [place] [facility] [place] Physician recommends and patient chooses bed at    Patient to be transferred to [facility] on   08/11/2014 Patient to be transferred to facility by [facility] Patient and family notified of transfer on 08/11/2014 Name of family member notified:  patient's daughter at bedside  The following physician request were entered in Epic:   Additional Comments:    Raynaldo Opitz, [place] Social Worker cell #: [phone]

## 2014-08-27 ENCOUNTER — Ambulatory Visit (INDEPENDENT_AMBULATORY_CARE_PROVIDER_SITE_OTHER): Payer: Medicare Other | Admitting: Cardiology

## 2014-08-27 VITALS — BP 152/80 | HR 62 | Ht 64.0 in | Wt 127.0 lb

## 2014-08-27 DIAGNOSIS — I5032 Chronic diastolic (congestive) heart failure: Secondary | ICD-10-CM | POA: Diagnosis not present

## 2014-08-27 NOTE — Patient Instructions (Signed)
You will received an appointment letter in the mailed for your next available appointment.

## 2014-08-27 NOTE — Progress Notes (Signed)
08/27/2014 [name]   Jun 10, 1923  [id number]  Primary Physician: Merrilee Seashore, MD Primary Cardiologist: Dr. [name]  Reason for Visit/CC: [facility] F/U for Diastolic CHF Exacerbation  HPI:  The patient is a 81 y/o female, followed by Dr. [name]. Her  history is remarkable for mechanical MVR in 2001 by Dr Cyndia Bent, chronic AFib, chronic venous LE edema,  HTN and chronic diastolic dysfunction. She is on digoxin, beta-blocker, and Coumadin anticoagulation. Her INRs are followed by her PCP. Last 2D echo was 10/10/13 which demonstrated normal LVEF at 55-60% with moderate AI and mild MR.   She was recently admitted by Internal Medicine to [facility] on 08/04/14 for sepsis, CAP and acute on chronic diastolic CHF. She was treated with antibiotics and dieretic therapy with IV Lasix. She had a good diuretic response and was discharged home on 40 mg PO daily. She was continued on digoxin and atenolol. She was discharged to a SNF for temporary rehab but has since transitioned back to home.  She reports to clinic today for post hospital f/u. She is accompanied by her daughter. She reports that she has done well. She denies any additional weight gain and no LEE. No dyspnea, orthopnea, PND, or chest pain. Fully compliant with medications and adherent to a low sodium diet. Denies any falls or abnormal bleeding on coumadin. She is scheduled for an INR check with her PCP next week.    Current Outpatient Prescriptions  Medication Sig Dispense Refill  . atenolol (TENORMIN) 50 MG tablet Take 50 mg by mouth daily.    . calcium-vitamin D (OSCAL WITH D) 500-200 MG-UNIT per tablet Take 1 tablet by mouth daily.    . Cholecalciferol (VITAMIN D-3) 1000 UNITS CAPS Take 2 capsules by mouth daily.     . clobetasol ointment (TEMOVATE) 6.59 % Apply 1 application topically as needed (blisters for bulous pemphigoid).     . COUMADIN 5 MG tablet Take 5 mg by mouth as directed. Monday, Tuesday and Thursday take  2.5mg  Saturday, Sunday, Wednesday, and Friday= 5mg   8  . digoxin (LANOXIN) 0.125 MG tablet Take 0.0625 mg by mouth daily.     . furosemide (LASIX) 40 MG tablet Take 1 tablet (40 mg total) by mouth daily.    Marland Kitchen KLOR-CON M20 20 MEQ tablet Take 1 tablet by mouth daily.  0  . latanoprost (XALATAN) 0.005 % ophthalmic solution Place 1 drop into both eyes at bedtime.    Marland Kitchen levothyroxine (SYNTHROID, LEVOTHROID) 50 MCG tablet Take 50 mcg by mouth daily.  6  . lisinopril (PRINIVIL,ZESTRIL) 10 MG tablet Take 10 mg by mouth daily.   3  . Multiple Vitamin (MULTIVITAMIN WITH MINERALS) TABS Take 1 tablet by mouth daily.     No current facility-administered medications for this visit.    Allergies  Allergen Reactions  . Heparin Anaphylaxis    History   Social History  . Marital Status: Widowed    Spouse Name: N/A  . Number of Children: 2  . Years of Education: 12   Occupational History  . Not on file.   Social History Main Topics  . Smoking status: Never Smoker   . Smokeless tobacco: Never Used  . Alcohol Use: No  . Drug Use: No  . Sexual Activity: Not on file   Other Topics Concern  . Not on file   Social History Narrative   Two daughters, [name] and [name]  Lives alone in a house with some steps near the mailbox, but otherwise  single story.  Does not use a cane or walker or other assist device.  Active and gardens a lot.  Completes ADLS without assistance.       Review of Systems: General: negative for chills, fever, night sweats or weight changes.  Cardiovascular: negative for chest pain, dyspnea on exertion, edema, orthopnea, palpitations, paroxysmal nocturnal dyspnea or shortness of breath Dermatological: negative for rash Respiratory: negative for cough or wheezing Urologic: negative for hematuria Abdominal: negative for nausea, vomiting, diarrhea, bright red blood per rectum, melena, or hematemesis Neurologic: negative for visual changes, syncope, or dizziness All other systems  reviewed and are otherwise negative except as noted above.    Blood pressure 152/80, pulse 62, height 5\' 4"  (1.626 m), weight 127 lb (57.607 kg).  General appearance: alert, cooperative and no distress Neck: no carotid bruit and no JVD Lungs: clear to auscultation bilaterally Heart: regular rate and rhythm, S1, S2 normal, no murmur, click, rub or gallop Extremities: no LEE Pulses: 2+ and symmetric Skin: warm and dry Neurologic: Grossly normal  EKG not performed.   ASSESSMENT AND PLAN:   1. Chronic Diastolic CHF: stable. Euvolemic on physical exam. No dyspnea, orthopnea, PND or LEE. Continue daily lasix and BB therapy. BP is well controlled.  Continue low sodium diet and daily weights.   2. H/O Mechanical MVR: stable on coumadin for anticoagulation. INR followed by PCP.   3. Chronic Atrial Fibrillation: rate controlled on atenolol and digoxin. HR in the low 60s. Asymptomatic. Continue Coumadin for a/c.   4. CAP: symptoms resolved. Patient has completed course of antibiotics.    PLAN  Continue current plan of care. Continue routine f/u with Dr. [name] every 6 months.    Amanda Richardson, [place] 08/27/2014 11:29 AM

## 2014-08-28 ENCOUNTER — Encounter: Payer: Self-pay | Admitting: Cardiology

## 2015-05-05 ENCOUNTER — Other Ambulatory Visit (HOSPITAL_COMMUNITY): Payer: Medicare Other

## 2015-05-12 ENCOUNTER — Ambulatory Visit: Payer: Medicare Other | Admitting: Cardiovascular Disease

## 2015-12-17 ENCOUNTER — Inpatient Hospital Stay (HOSPITAL_COMMUNITY)
Admission: EM | Admit: 2015-12-17 | Discharge: 2015-12-22 | DRG: 470 | Disposition: A | Discharge status: Skilled Nursing Facility | Payer: Medicare Other | Attending: Family Medicine | Admitting: Family Medicine

## 2015-12-17 ENCOUNTER — Emergency Department (HOSPITAL_COMMUNITY): Payer: Medicare Other

## 2015-12-17 ENCOUNTER — Encounter (HOSPITAL_COMMUNITY): Payer: Self-pay | Admitting: Emergency Medicine

## 2015-12-17 DIAGNOSIS — Z85828 Personal history of other malignant neoplasm of skin: Secondary | ICD-10-CM | POA: Diagnosis not present

## 2015-12-17 DIAGNOSIS — D62 Acute posthemorrhagic anemia: Secondary | ICD-10-CM | POA: Diagnosis not present

## 2015-12-17 DIAGNOSIS — Z952 Presence of prosthetic heart valve: Secondary | ICD-10-CM | POA: Diagnosis not present

## 2015-12-17 DIAGNOSIS — Z7901 Long term (current) use of anticoagulants: Secondary | ICD-10-CM | POA: Diagnosis not present

## 2015-12-17 DIAGNOSIS — Z8249 Family history of ischemic heart disease and other diseases of the circulatory system: Secondary | ICD-10-CM

## 2015-12-17 DIAGNOSIS — W1830XA Fall on same level, unspecified, initial encounter: Secondary | ICD-10-CM | POA: Diagnosis present

## 2015-12-17 DIAGNOSIS — E039 Hypothyroidism, unspecified: Secondary | ICD-10-CM | POA: Diagnosis present

## 2015-12-17 DIAGNOSIS — S72002D Fracture of unspecified part of neck of left femur, subsequent encounter for closed fracture with routine healing: Secondary | ICD-10-CM | POA: Diagnosis not present

## 2015-12-17 DIAGNOSIS — Z808 Family history of malignant neoplasm of other organs or systems: Secondary | ICD-10-CM

## 2015-12-17 DIAGNOSIS — Z905 Acquired absence of kidney: Secondary | ICD-10-CM

## 2015-12-17 DIAGNOSIS — Z951 Presence of aortocoronary bypass graft: Secondary | ICD-10-CM

## 2015-12-17 DIAGNOSIS — I482 Chronic atrial fibrillation, unspecified: Secondary | ICD-10-CM | POA: Diagnosis present

## 2015-12-17 DIAGNOSIS — S72012A Unspecified intracapsular fracture of left femur, initial encounter for closed fracture: Secondary | ICD-10-CM | POA: Diagnosis present

## 2015-12-17 DIAGNOSIS — I110 Hypertensive heart disease with heart failure: Secondary | ICD-10-CM | POA: Diagnosis present

## 2015-12-17 DIAGNOSIS — S72002A Fracture of unspecified part of neck of left femur, initial encounter for closed fracture: Secondary | ICD-10-CM | POA: Diagnosis not present

## 2015-12-17 DIAGNOSIS — Z888 Allergy status to other drugs, medicaments and biological substances status: Secondary | ICD-10-CM | POA: Diagnosis not present

## 2015-12-17 DIAGNOSIS — Z954 Presence of other heart-valve replacement: Secondary | ICD-10-CM | POA: Diagnosis not present

## 2015-12-17 DIAGNOSIS — Z9049 Acquired absence of other specified parts of digestive tract: Secondary | ICD-10-CM

## 2015-12-17 DIAGNOSIS — Z96649 Presence of unspecified artificial hip joint: Secondary | ICD-10-CM

## 2015-12-17 DIAGNOSIS — I5032 Chronic diastolic (congestive) heart failure: Secondary | ICD-10-CM | POA: Diagnosis present

## 2015-12-17 DIAGNOSIS — W19XXXA Unspecified fall, initial encounter: Secondary | ICD-10-CM

## 2015-12-17 DIAGNOSIS — I2510 Atherosclerotic heart disease of native coronary artery without angina pectoris: Secondary | ICD-10-CM | POA: Diagnosis present

## 2015-12-17 LAB — CBC WITH DIFFERENTIAL/PLATELET
BASOS PCT: 0 %
Basophils Absolute: 0 10*3/uL (ref 0.0–0.1)
EOS ABS: 0 10*3/uL (ref 0.0–0.7)
Eosinophils Relative: 0 %
HCT: 36.4 % (ref 36.0–46.0)
Hemoglobin: 12.8 g/dL (ref 12.0–15.0)
LYMPHS ABS: 0.6 10*3/uL — AB (ref 0.7–4.0)
Lymphocytes Relative: 8 %
MCH: 31.4 pg (ref 26.0–34.0)
MCHC: 35.2 g/dL (ref 30.0–36.0)
MCV: 89.2 fL (ref 78.0–100.0)
MONOS PCT: 7 %
Monocytes Absolute: 0.6 10*3/uL (ref 0.1–1.0)
NEUTROS PCT: 85 %
Neutro Abs: 6.6 10*3/uL (ref 1.7–7.7)
PLATELETS: 119 10*3/uL — AB (ref 150–400)
RBC: 4.08 MIL/uL (ref 3.87–5.11)
RDW: 14.2 % (ref 11.5–15.5)
WBC: 7.8 10*3/uL (ref 4.0–10.5)

## 2015-12-17 LAB — COMPREHENSIVE METABOLIC PANEL
ALK PHOS: 64 U/L (ref 38–126)
ALT: 28 U/L (ref 14–54)
ANION GAP: 8 (ref 5–15)
AST: 45 U/L — ABNORMAL HIGH (ref 15–41)
Albumin: 3.8 g/dL (ref 3.5–5.0)
BUN: 18 mg/dL (ref 6–20)
CHLORIDE: 107 mmol/L (ref 101–111)
CO2: 26 mmol/L (ref 22–32)
Calcium: 9.1 mg/dL (ref 8.9–10.3)
Creatinine, Ser: 0.84 mg/dL (ref 0.44–1.00)
GFR calc Af Amer: >60 mL/min (ref >60)
GFR calc non Af Amer: 59 mL/min — ABNORMAL LOW (ref >60)
GLUCOSE: 146 mg/dL — AB (ref 65–99)
Potassium: 3.9 mmol/L (ref 3.5–5.1)
SODIUM: 141 mmol/L (ref 135–145)
Total Bilirubin: 1.5 mg/dL — ABNORMAL HIGH (ref 0.3–1.2)
Total Protein: 7 g/dL (ref 6.5–8.1)

## 2015-12-17 LAB — DIGOXIN LEVEL: Digoxin Level: 0.2 ng/mL — ABNORMAL LOW (ref 0.8–2.0)

## 2015-12-17 LAB — TROPONIN I: Troponin I: <0.03 ng/mL (ref <0.03)

## 2015-12-17 LAB — PROTIME-INR
INR: 2.67
Prothrombin Time: 29 seconds — ABNORMAL HIGH (ref 11.4–15.2)

## 2015-12-17 MED ORDER — LISINOPRIL 10 MG PO TABS
10.0000 mg | ORAL_TABLET | Freq: Every day | ORAL | Status: DC
Start: 2015-12-17 — End: 2015-12-19
  Administered 2015-12-17: 10 mg via ORAL
  Filled 2015-12-17: qty 1

## 2015-12-17 MED ORDER — ADULT MULTIVITAMIN W/MINERALS CH
1.0000 | ORAL_TABLET | Freq: Every evening | ORAL | Status: DC
  Administered 2015-12-17 – 2015-12-22 (×5): 1 via ORAL
  Filled 2015-12-17 (×5): qty 1

## 2015-12-17 MED ORDER — MORPHINE SULFATE (PF) 2 MG/ML IV SOLN: 0.5000 mg | INTRAVENOUS | Status: DC | PRN

## 2015-12-17 MED ORDER — ATENOLOL 50 MG PO TABS
50.0000 mg | ORAL_TABLET | Freq: Every day | ORAL | Status: DC
  Administered 2015-12-17 – 2015-12-22 (×5): 50 mg via ORAL
  Filled 2015-12-17 (×6): qty 1

## 2015-12-17 MED ORDER — SODIUM CHLORIDE 0.9 % IV SOLN
INTRAVENOUS | Status: DC
  Administered 2015-12-17 – 2015-12-18 (×2): via INTRAVENOUS

## 2015-12-17 MED ORDER — VITAMIN K1 10 MG/ML IJ SOLN
1.0000 mg | Freq: Once | INTRAVENOUS | Status: AC
  Administered 2015-12-17: 1 mg via INTRAVENOUS
  Filled 2015-12-17: qty 0.5

## 2015-12-17 MED ORDER — DIGOXIN 125 MCG PO TABS
0.0625 mg | ORAL_TABLET | Freq: Every day | ORAL | Status: DC
  Administered 2015-12-17 – 2015-12-22 (×5): 0.0625 mg via ORAL
  Filled 2015-12-17 (×5): qty 1

## 2015-12-17 MED ORDER — HYDROCODONE-ACETAMINOPHEN 5-325 MG PO TABS
1.0000 | ORAL_TABLET | Freq: Four times a day (QID) | ORAL | Status: DC | PRN
  Administered 2015-12-18: 1 via ORAL
  Administered 2015-12-19: 2 via ORAL
  Administered 2015-12-21 – 2015-12-22 (×3): 1 via ORAL
  Filled 2015-12-17: qty 2
  Filled 2015-12-17 (×4): qty 1

## 2015-12-17 MED ORDER — LEVOTHYROXINE SODIUM 50 MCG PO TABS
50.0000 ug | ORAL_TABLET | Freq: Every day | ORAL | Status: DC
  Administered 2015-12-19 – 2015-12-22 (×4): 50 ug via ORAL
  Filled 2015-12-17 (×4): qty 1

## 2015-12-17 NOTE — ED Triage Notes (Signed)
Per EMS, pt stated she fell between 1200-1230 today. Per EMS, No LOC, pain to left hip from fall. Pt has an inch of shortening and external rotation. Per EMS, VS stable, pulses intact. Pelvis bound with a sheet. 20g in left Ac. 100 of Fentanyl. A&O. Pt takes Warfarin. Per EMS, pt was in Afib during transport. Last set of VS = BP 169/74, HR 99 & irregular, RR 18, O2 95% room air. Blood sugar 185.

## 2015-12-17 NOTE — ED Notes (Signed)
Bed: WA20 Expected date:  Expected time:  Means of arrival:  Comments: 55 F left leg deformity

## 2015-12-17 NOTE — Consult Note (Signed)
Reason for Consult:L hip pain Referring Physician: [name]. A  Amanda Richardson is an 82 y.o. female.  HPI: 82 yo female fell in kitchen today. Arrived via EMS. Takes Coumadin daily. Reports immediate left hip pain and dysfunction. At baseline walks with a cane in public, ambulates without assistance at home. Lives by herself. Family reports hx of Afib, h/o mitral valve replacement, CHF, CAD, bilat LE edema with weeping, severe allergy to heparin.  Past Medical History:  Diagnosis Date  . Allergic rhinitis   . Atrial fibrillation ([place])   . Basal cell carcinoma   . Bilateral lower extremity edema    history of venous insufficiency  . Bullous pemphigoid   . CHF (congestive heart failure) ([place])   . Chronic anemia    heavy periods  . Chronic anticoagulation   . Coronary artery disease   . DVT (deep venous thrombosis) ([place])    possible in 1990s and does not remember being on blood thinner  . Glaucoma   . H/O mitral valve replacement 2001   mechanical heart valve; family states indication may have been MR from MVP or  rheumatic heart disease  . Hypertension   . Hypothyroidism   . 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 S/p nephrectomy    left  . Shortness of breath dyspnea   . Thyroid disease   . Urinary incontinence     Past Surgical History:  Procedure Laterality Date  . ABDOMINAL HYSTERECTOMY  1960s  . APPENDECTOMY  1960s  . CORONARY ARTERY BYPASS GRAFT  01/2000  . HERNIA REPAIR    . hx broken collarbone    . LAPAROSCOPIC SMALL BOWEL RESECTION  2004 or 5   adhesions and ischemic bowel  . MITRAL VALVE REPLACEMENT  01/2000   mechanical heart valve  . TONSILLECTOMY  1933  . TRANSTHORACIC ECHOCARDIOGRAM  02/2012   EF 60-65%, mild LVH; mildly thickened AV leaflets with sclerosis and moderate regurg; mechanical MV prosthesis; LA dilated; RVSP increased; RA severely dilated; increased LA pressure; moderate TR    Family History  Problem Relation Age of Onset  .  Irregular heart beat Mother   . Other Mother     pellegra  . Heart failure Mother     had very swollen legs??  . Stroke Mother   . Prostate cancer Father     also skin cancer  . Heart attack Daughter   . Heart disease Brother   . Heart attack Brother 13    still alive    Social History:  reports that she has never smoked. She has never used smokeless tobacco. She reports that she does not drink alcohol or use drugs.  Allergies:  Allergies  Allergen Reactions  . Heparin Anaphylaxis    Medications: I have reviewed the patient's current medications.  Results for orders placed or performed during the hospital encounter of 12/17/15 (from the past 48 hour(s))  CBC with Differential/Platelet     Status: Abnormal   Collection Time: 12/17/15  8:41 PM  Result Value Ref Range   WBC 7.8 4.0 - 10.5 K/uL   RBC 4.08 3.87 - 5.11 MIL/uL   Hemoglobin 12.8 12.0 - 15.0 g/dL   HCT 36.4 36.0 - 46.0 %   MCV 89.2 78.0 - 100.0 fL   MCH 31.4 26.0 - 34.0 pg   MCHC 35.2 30.0 - 36.0 g/dL   RDW 14.2 11.5 - 15.5 %   Platelets 119 (L) 150 - 400 K/uL  Comment: SPECIMEN CHECKED FOR CLOTS REPEATED TO VERIFY PLATELET COUNT CONFIRMED BY SMEAR    Neutrophils Relative % 85 %   Neutro Abs 6.6 1.7 - 7.7 K/uL   Lymphocytes Relative 8 %   Lymphs Abs 0.6 (L) 0.7 - 4.0 K/uL   Monocytes Relative 7 %   Monocytes Absolute 0.6 0.1 - 1.0 K/uL   Eosinophils Relative 0 %   Eosinophils Absolute 0.0 0.0 - 0.7 K/uL   Basophils Relative 0 %   Basophils Absolute 0.0 0.0 - 0.1 K/uL  Comprehensive metabolic panel     Status: Abnormal   Collection Time: 12/17/15  8:41 PM  Result Value Ref Range   Sodium 141 135 - 145 mmol/L   Potassium 3.9 3.5 - 5.1 mmol/L   Chloride 107 101 - 111 mmol/L   CO2 26 22 - 32 mmol/L   Glucose, Bld 146 (H) 65 - 99 mg/dL   BUN 18 6 - 20 mg/dL   Creatinine, Ser 0.84 0.44 - 1.00 mg/dL   Calcium 9.1 8.9 - 10.3 mg/dL   Total Protein 7.0 6.5 - 8.1 g/dL   Albumin 3.8 3.5 - 5.0 g/dL   AST  45 (H) 15 - 41 U/L   ALT 28 14 - 54 U/L   Alkaline Phosphatase 64 38 - 126 U/L   Total Bilirubin 1.5 (H) 0.3 - 1.2 mg/dL   GFR calc non Af Amer 59 (L) >60 mL/min   GFR calc Af Amer >60 >60 mL/min    Comment: (NOTE) The eGFR has been calculated using the CKD EPI equation. This calculation has not been validated in all clinical situations. eGFR's persistently <60 mL/min signify possible Chronic Kidney Disease.    Anion gap 8 5 - 15  Protime-INR     Status: Abnormal   Collection Time: 12/17/15  8:41 PM  Result Value Ref Range   Prothrombin Time 29.0 (H) 11.4 - 15.2 seconds   INR 2.67   Troponin I     Status: None   Collection Time: 12/17/15  8:41 PM  Result Value Ref Range   Troponin I <0.03 <0.03 ng/mL  Digoxin level     Status: Abnormal   Collection Time: 12/17/15  8:41 PM  Result Value Ref Range   Digoxin Level 0.2 (L) 0.8 - 2.0 ng/mL    Dg Chest 1 View  Result Date: 12/17/2015 CLINICAL DATA:  Status post fall.  History of AFib and CHF. EXAM: CHEST 1 VIEW COMPARISON:  Chest radiograph 08/10/2014 and before FINDINGS: The heart remains massively enlarged. There is right retrocardiac consolidation. Previously seen pleural effusion on the right has resolved. No pneumothorax. IMPRESSION: 1. Massive cardiomegaly, unchanged. 2. Probable superimposed right lower lobe consolidation. This could indicate developing infection or atelectasis. Electronically Signed   By: Ulyses Jarred M.D.   On: 12/17/2015 20:52   Dg Hip Unilat W Or Wo Pelvis 2-3 Views Left  Result Date: 12/17/2015 CLINICAL DATA:  Fall with hip pain.  LEFT hip pain EXAM: DG HIP (WITH OR WITHOUT PELVIS) 2-3V LEFT COMPARISON:  None. FINDINGS: Subcapital fracture of the LEFT femoral neck with varus angulation. There is mild migration of the femur superiorly. IMPRESSION: Subcapital LEFT femoral neck fracture Electronically Signed   By: Suzy Bouchard M.D.   On: 12/17/2015 20:50    Review of Systems  Musculoskeletal:       Left  hip pain/dysfunction  All other systems reviewed and are negative.  Blood pressure (!) 204/88, pulse 61, temperature 98.3 F (36.8 C),  temperature source Oral, resp. rate 20, SpO2 93 %. Physical Exam  Constitutional: She is oriented to person, place, and time. She appears well-developed.  HENT:  Head: Normocephalic and atraumatic.  Eyes: EOM are normal.  Neck: Normal range of motion.  Cardiovascular: Normal rate.   Respiratory: Effort normal.  Musculoskeletal:  Left leg shortened and externally rotated Bilateral LE with sig pitting edema TTP left hip Wiggles toes, distally NVI   Neurological: She is alert and oriented to person, place, and time.  Skin: Skin is warm and dry.  Psychiatric: She has a normal mood and affect. Her behavior is normal.    Assessment/Plan: L subcapital femoral neck fracture in 82 yo with significant comorbidities Discussed with patient and family conservative and surgical tx options, left hip hemiarthroplasty recommended to minimize risks associated with bed rest and to allow early ambulation and return to function. She does have significant cardiac comorbities that will need to be monitored. Medicine to admit and we appreciate their help in this complicated patient INR- 2.6 today, did not take coumadin today If INR WNL tomorrow- could plan on surgery tomorrow afternoon, if not plan for Sunday NWB L LE NPO after midnight   Tanish Sinkler 12/17/2015, 10:07 PM

## 2015-12-17 NOTE — ED Provider Notes (Addendum)
[facility] DEPT Provider Note   CSN: [id number] Arrival date & time: 12/17/15  1940     History   Chief Complaint Chief Complaint  Patient presents with  . Fall  . Hip Pain    HPI Amanda Richardson is a 82 y.o. female.  82 year old female who fell just prior to arrival onto her left hip. States she was standing in the kitchen and her leg gave way. Did strike her head against an object in her kitchen. Does take Coumadin on a chronic basis. Denies any current head pain or neck pain. Denies lower back pain. No rib discomfort. Pain at the left hip is sharp and worse with any movement. Denies any distal numbness or tingling to the left foot. Denies any recent illnesses. Called EMS and was given 100 mg of fentanyl due to the shortening or rotation of her left lower extremity.      Past Medical History:  Diagnosis Date  . Allergic rhinitis   . Atrial fibrillation   . Basal cell carcinoma   . Bilateral lower extremity edema    history of venous insufficiency  . Bullous pemphigoid   . CHF (congestive heart failure)   . Chronic anemia    heavy periods  . Chronic anticoagulation   . Coronary artery disease   . DVT (deep venous thrombosis)    possible in 1990s and does not remember being on blood thinner  . Glaucoma   . H/O mitral valve replacement 2001   mechanical heart valve; family states indication may have been MR from MVP or  rheumatic heart disease  . Hypertension   . Hypothyroidism   . 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 S/p nephrectomy    left  . Shortness of breath dyspnea   . Thyroid disease   . Urinary incontinence     Patient Active Problem List   Diagnosis Date Noted  . Transaminitis 08/11/2014  . CAP (community acquired pneumonia) 08/04/2014  . Acute on chronic diastolic heart failure ([place]) 08/04/2014  . Subtherapeutic anticoagulation 08/04/2014  . Edema extremities- chronic. Seen by Dr Debara Pickett 10/13 11/13/2012  . Cellulitis 03/01/2012    . Fever 03/01/2012  . H/O mechanical mitral valve replacement 2001   . Thyroid disease   . Hypertension   . CHF (congestive heart failure) ([place])   . S/p nephrectomy   . Coronary artery disease   . H/O coronary artery bypass surgery   . Ischemic bowel disease ([place])   . Urinary incontinence   . Chronic atrial fibrillation ([place])   . DVT (deep venous thrombosis) ([place])   . Hypothyroidism   . Chronic anemia   . Glaucoma   . Bullous pemphigoid   . Chronic anticoagulation   . Basal cell carcinoma   . Allergic rhinitis     Past Surgical History:  Procedure Laterality Date  . ABDOMINAL HYSTERECTOMY  1960s  . APPENDECTOMY  1960s  . CORONARY ARTERY BYPASS GRAFT  01/2000  . HERNIA REPAIR    . hx broken collarbone    . LAPAROSCOPIC SMALL BOWEL RESECTION  2004 or 5   adhesions and ischemic bowel  . MITRAL VALVE REPLACEMENT  01/2000   mechanical heart valve  . TONSILLECTOMY  1933  . TRANSTHORACIC ECHOCARDIOGRAM  02/2012   EF 60-65%, mild LVH; mildly thickened AV leaflets with sclerosis and moderate regurg; mechanical MV prosthesis; LA dilated; RVSP increased; RA severely dilated; increased LA pressure; moderate TR    OB History  No data available       Home Medications    Prior to Admission medications   Medication Sig Start Date End Date Taking? Authorizing Provider  atenolol (TENORMIN) 50 MG tablet Take 50 mg by mouth daily.    Historical Provider, MD  calcium-vitamin D (OSCAL WITH D) 500-200 MG-UNIT per tablet Take 1 tablet by mouth daily.    Historical Provider, MD  Cholecalciferol (VITAMIN D-3) 1000 UNITS CAPS Take 2 capsules by mouth daily.     Historical Provider, MD  clobetasol ointment (TEMOVATE) [id number] % Apply 1 application topically as needed (blisters for bulous pemphigoid).     Historical Provider, MD  COUMADIN 5 MG tablet Take 5 mg by mouth as directed. Monday, Tuesday and Thursday take 2.5mg  Saturday, Sunday, Wednesday, and Friday= 5mg  04/23/14   Historical  Provider, MD  digoxin (LANOXIN) 0.125 MG tablet Take 0.0625 mg by mouth daily.     Historical Provider, MD  furosemide (LASIX) 40 MG tablet Take 1 tablet (40 mg total) by mouth daily. 08/11/14   Verlee Monte, MD  KLOR-CON M20 20 MEQ tablet Take 1 tablet by mouth daily. 08/22/14   Historical Provider, MD  latanoprost (XALATAN) 0.005 % ophthalmic solution Place 1 drop into both eyes at bedtime.    Historical Provider, MD  levothyroxine (SYNTHROID, LEVOTHROID) 50 MCG tablet Take 50 mcg by mouth daily. 05/05/14   Historical Provider, MD  lisinopril (PRINIVIL,ZESTRIL) 10 MG tablet Take 10 mg by mouth daily.  03/27/14   Historical Provider, MD  Multiple Vitamin (MULTIVITAMIN WITH MINERALS) TABS Take 1 tablet by mouth daily.    Historical Provider, MD    Family History Family History  Problem Relation Age of Onset  . Irregular heart beat Mother   . Other Mother     pellegra  . Heart failure Mother     had very swollen legs??  . Stroke Mother   . Prostate cancer Father     also skin cancer  . Heart attack Daughter   . Heart disease Brother   . Heart attack Brother 18    still alive    Social History Social History  Substance Use Topics  . Smoking status: Never Smoker  . Smokeless tobacco: Never Used  . Alcohol use No     Allergies   Heparin   Review of Systems Review of Systems  All other systems reviewed and are negative.    Physical Exam Updated Vital Signs BP (!) 204/88 (BP Location: Right Arm)   Pulse 61   Temp 98.3 F (36.8 C) (Oral)   Resp 20   SpO2 93%   Physical Exam  Constitutional: She is oriented to person, place, and time. She appears well-developed and well-nourished.  Non-toxic appearance. No distress.  HENT:  Head: Normocephalic and atraumatic.  Eyes: Conjunctivae, EOM and lids are normal. Pupils are equal, round, and reactive to light.  Neck: Normal range of motion. Neck supple. No tracheal deviation present. No thyroid mass present.  Cardiovascular:  Normal rate, regular rhythm and normal heart sounds.  Exam reveals no gallop.   No murmur heard. Pulmonary/Chest: Effort normal and breath sounds normal. No stridor. No respiratory distress. She has no decreased breath sounds. She has no wheezes. She has no rhonchi. She has no rales.  Nontender along the chest wall  Abdominal: Soft. Normal appearance and bowel sounds are normal. She exhibits no distension. There is no tenderness. There is no rebound and no CVA tenderness.  Musculoskeletal: Normal range of motion.  She exhibits no edema or tenderness.  Left lower extremity shortened and rotated. Neurovascular intact at the left foot  Neurological: She is alert and oriented to person, place, and time. She has normal strength. No cranial nerve deficit or sensory deficit. GCS eye subscore is 4. GCS verbal subscore is 5. GCS motor subscore is 6.  Skin: Skin is warm and dry. No abrasion and no rash noted.  Psychiatric: She has a normal mood and affect. Her speech is normal and behavior is normal.  Nursing note and vitals reviewed.    ED Treatments / Results  Labs (all labs ordered are listed, but only abnormal results are displayed) Labs Reviewed  CBC WITH DIFFERENTIAL/PLATELET  COMPREHENSIVE METABOLIC PANEL  PROTIME-INR  TROPONIN I  URINALYSIS, ROUTINE W REFLEX MICROSCOPIC (NOT AT [facility])  DIGOXIN LEVEL    EKG  EKG Interpretation  Date/Time:  Friday December 17 2015 20:04:48 EDT Ventricular Rate:  102 PR Interval:    QRS Duration: 114 QT Interval:  375 QTC Calculation: 489 R Axis:   101 Text Interpretation:  Atrial fibrillation Probable anterior infarct, age indeterminate No significant change since last tracing Confirmed by Edelmiro Innocent  MD, Jayden Rudge ([id number]) on 12/17/2015 10:34:45 PM       Radiology No results found.  Procedures Procedures (including critical care time)  Medications Ordered in ED Medications - No data to display   Initial Impression / Assessment and Plan / ED Course    I have reviewed the triage vital signs and the nursing notes.  Pertinent labs & imaging results that were available during my care of the patient were reviewed by me and considered in my medical decision making (see chart for details).  Clinical Course    Patient with left hip fracture. Consult orthopedics to Dr. Tamera Punt was made. Will consult hospitalist for admission  Final Clinical Impressions(s) / ED Diagnoses   Final diagnoses:  Fall    New Prescriptions New Prescriptions   No medications on file     Lacretia Leigh, MD 12/17/15 2153    Lacretia Leigh, MD 12/17/15 2235

## 2015-12-17 NOTE — H&P (Signed)
History and Physical    Amanda Richardson [id number] DOB: [birth year] DOA: 12/17/2015   PCP: Merrilee Seashore, MD Chief Complaint:  Chief Complaint  Patient presents with  . Fall  . Hip Pain    HPI: Amanda Richardson is a 82 y.o. female with medical history significant of mechanical mitral valve replacement.  Heparin allergy per family "almost killed her", per allergies list "anaphalaxis", A.Fib, on chronic coumadin therapy.  Patient presents to the ED after a mechanical fall that occurred at home.  She was standing in kitchen and leg gave way.  Has pain located in L hip, worse with movement, better at rest.  Unable to bear weight.  Pain severe.  ED Course: L hip femoral neck fx.  INR 2.67.  Review of Systems: As per HPI otherwise 10 point review of systems negative.    Past Medical History:  Diagnosis Date  . Allergic rhinitis   . Atrial fibrillation ([place])   . Basal cell carcinoma   . Bilateral lower extremity edema    history of venous insufficiency  . Bullous pemphigoid   . CHF (congestive heart failure) ([place])   . Chronic anemia    heavy periods  . Chronic anticoagulation   . Coronary artery disease   . DVT (deep venous thrombosis) ([place])    possible in 1990s and does not remember being on blood thinner  . Glaucoma   . H/O mitral valve replacement 2001   mechanical heart valve; family states indication may have been MR from MVP or  rheumatic heart disease  . Hypertension   . Hypothyroidism   . 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 S/p nephrectomy    left  . Shortness of breath dyspnea   . Thyroid disease   . Urinary incontinence     Past Surgical History:  Procedure Laterality Date  . ABDOMINAL HYSTERECTOMY  1960s  . APPENDECTOMY  1960s  . CORONARY ARTERY BYPASS GRAFT  01/2000  . HERNIA REPAIR    . hx broken collarbone    . LAPAROSCOPIC SMALL BOWEL RESECTION  2004 or 5   adhesions and ischemic bowel  . MITRAL VALVE REPLACEMENT   01/2000   mechanical heart valve  . TONSILLECTOMY  1933  . TRANSTHORACIC ECHOCARDIOGRAM  02/2012   EF 60-65%, mild LVH; mildly thickened AV leaflets with sclerosis and moderate regurg; mechanical MV prosthesis; LA dilated; RVSP increased; RA severely dilated; increased LA pressure; moderate TR     reports that she has never smoked. She has never used smokeless tobacco. She reports that she does not drink alcohol or use drugs.  Allergies  Allergen Reactions  . Heparin Anaphylaxis    Family History  Problem Relation Age of Onset  . Irregular heart beat Mother   . Other Mother     pellegra  . Heart failure Mother     had very swollen legs??  . Stroke Mother   . Prostate cancer Father     also skin cancer  . Heart attack Daughter   . Heart disease Brother   . Heart attack Brother 79    still alive      Prior to Admission medications   Medication Sig Start Date End Date Taking? Authorizing Provider  atenolol (TENORMIN) 25 MG tablet Take 50 mg by mouth daily.   Yes Historical Provider, MD  calcium-vitamin D (OSCAL WITH D) 500-200 MG-UNIT per tablet Take 1 tablet by mouth every evening.    Yes Historical  Provider, MD  Cholecalciferol (VITAMIN D-3) 1000 UNITS CAPS Take 2 capsules by mouth every evening.    Yes Historical Provider, MD  COUMADIN 5 MG tablet Take 2.5-5 mg by mouth as directed. Mon, Wed, Fri: Take 2.5mg  Sun, Tues, Thurs, Sat= 5mg  04/23/14  Yes Historical Provider, MD  digoxin (LANOXIN) 0.125 MG tablet Take 0.0625 mg by mouth daily.    Yes Historical Provider, MD  furosemide (LASIX) 40 MG tablet Take 1 tablet (40 mg total) by mouth daily. 08/11/14  Yes Mutaz Elmahi, MD  KLOR-CON M20 20 MEQ tablet Take 1 tablet by mouth daily. 08/22/14  Yes Historical Provider, MD  latanoprost (XALATAN) 0.005 % ophthalmic solution Place 1 drop into both eyes at bedtime.   Yes Historical Provider, MD  levothyroxine (SYNTHROID, LEVOTHROID) 50 MCG tablet Take 50 mcg by mouth daily. 05/05/14   Yes Historical Provider, MD  lisinopril (PRINIVIL,ZESTRIL) 10 MG tablet Take 10 mg by mouth daily.  03/27/14  Yes Historical Provider, MD  Multiple Vitamin (MULTIVITAMIN WITH MINERALS) TABS Take 1 tablet by mouth every evening.    Yes Historical Provider, MD  clobetasol ointment (TEMOVATE) [id number] % Apply 1 application topically as needed (blisters for bulous pemphigoid).     Historical Provider, MD    Physical Exam: Vitals:   12/17/15 1957  BP: (!) 204/88  Pulse: 61  Resp: 20  Temp: 98.3 F (36.8 C)  TempSrc: Oral  SpO2: 93%      Constitutional: NAD, calm, comfortable Eyes: PERRL, lids and conjunctivae normal ENMT: Mucous membranes are moist. Posterior pharynx clear of any exudate or lesions.Normal dentition.  Neck: normal, supple, no masses, no thyromegaly Respiratory: clear to auscultation bilaterally, no wheezing, no crackles. Normal respiratory effort. No accessory muscle use.  Cardiovascular: Regular rate and rhythm, no murmurs / rubs / gallops. No extremity edema. 2+ pedal pulses. No carotid bruits.  Abdomen: no tenderness, no masses palpated. No hepatosplenomegaly. Bowel sounds positive.  Musculoskeletal: no clubbing / cyanosis. No joint deformity upper and lower extremities. Good ROM, no contractures. Normal muscle tone.  Skin: no rashes, lesions, ulcers. No induration Neurologic: CN 2-12 grossly intact. Sensation intact, DTR normal. Strength 5/5 in all 4.  Psychiatric: Normal judgment and insight. Alert and oriented x 3. Normal mood.    Labs on Admission: I have personally reviewed following labs and imaging studies  CBC:  Recent Labs Lab 12/17/15 2041  WBC 7.8  NEUTROABS 6.6  HGB 12.8  HCT 36.4  MCV 89.2  PLT 123456*   Basic Metabolic Panel:  Recent Labs Lab 12/17/15 2041  NA 141  K 3.9  CL 107  CO2 26  GLUCOSE 146*  BUN 18  CREATININE 0.84  CALCIUM 9.1   GFR: CrCl cannot be calculated (Unknown ideal weight.). Liver Function Tests:  Recent  Labs Lab 12/17/15 2041  AST 45*  ALT 28  ALKPHOS 64  BILITOT 1.5*  PROT 7.0  ALBUMIN 3.8   No results for input(s): LIPASE, AMYLASE in the last 168 hours. No results for input(s): AMMONIA in the last 168 hours. Coagulation Profile:  Recent Labs Lab 12/17/15 2041  INR 2.67   Cardiac Enzymes:  Recent Labs Lab 12/17/15 2041  TROPONINI <0.03   BNP (last 3 results) No results for input(s): PROBNP in the last 8760 hours. HbA1C: No results for input(s): HGBA1C in the last 72 hours. CBG: No results for input(s): GLUCAP in the last 168 hours. Lipid Profile: No results for input(s): CHOL, HDL, LDLCALC, TRIG, CHOLHDL, LDLDIRECT in the last  72 hours. Thyroid Function Tests: No results for input(s): TSH, T4TOTAL, FREET4, T3FREE, THYROIDAB in the last 72 hours. Anemia Panel: No results for input(s): VITAMINB12, FOLATE, FERRITIN, TIBC, IRON, RETICCTPCT in the last 72 hours. Urine analysis:    Component Value Date/Time   COLORURINE ORANGE (A) 08/04/2014 1608   APPEARANCEUR CLOUDY (A) 08/04/2014 1608   LABSPEC 1.026 08/04/2014 1608   PHURINE 6.0 08/04/2014 1608   GLUCOSEU NEGATIVE 08/04/2014 1608   HGBUR LARGE (A) 08/04/2014 1608   BILIRUBINUR MODERATE (A) 08/04/2014 1608   KETONESUR NEGATIVE 08/04/2014 1608   PROTEINUR >300 (A) 08/04/2014 1608   UROBILINOGEN 4.0 (H) 08/04/2014 1608   NITRITE NEGATIVE 08/04/2014 1608   LEUKOCYTESUR TRACE (A) 08/04/2014 1608   Sepsis Labs: @LABRCNTIP (procalcitonin:4,lacticidven:4) )No results found for this or any previous visit (from the past 240 hour(s)).   Radiological Exams on Admission: Dg Chest 1 View  Result Date: 12/17/2015 CLINICAL DATA:  Status post fall.  History of AFib and CHF. EXAM: CHEST 1 VIEW COMPARISON:  Chest radiograph 08/10/2014 and before FINDINGS: The heart remains massively enlarged. There is right retrocardiac consolidation. Previously seen pleural effusion on the right has resolved. No pneumothorax. IMPRESSION: 1.  Massive cardiomegaly, unchanged. 2. Probable superimposed right lower lobe consolidation. This could indicate developing infection or atelectasis. Electronically Signed   By: Ulyses Jarred M.D.   On: 12/17/2015 20:52   Dg Hip Unilat W Or Wo Pelvis 2-3 Views Left  Result Date: 12/17/2015 CLINICAL DATA:  Fall with hip pain.  LEFT hip pain EXAM: DG HIP (WITH OR WITHOUT PELVIS) 2-3V LEFT COMPARISON:  None. FINDINGS: Subcapital fracture of the LEFT femoral neck with varus angulation. There is mild migration of the femur superiorly. IMPRESSION: Subcapital LEFT femoral neck fracture Electronically Signed   By: Suzy Bouchard M.D.   On: 12/17/2015 20:50    EKG: Independently reviewed.  Assessment/Plan Principal Problem:   Left displaced femoral neck fracture (HCC) Active Problems:   H/O mechanical mitral valve replacement 2001   Chronic atrial fibrillation (HCC)   Chronic anticoagulation    1. L femoral neck fx - complicated  1. Hold coumadin 2. Give 1mg  vit K, want to reverse as slowly as possible to minimize thrombus risk with mechanical valve 3. Goal INR < 2.0 so they can perform surgery 4. Recheck INR at noon per Dr. Tamera Punt, if less than 2.0 they will take to OR tomorrow, if not then will have to wait till Sunday. 5. Unfortunately due to history of "nearly dying when given heparin" per family and "anaphalaxis" per chart, cannot bridge with heparin therapy. 6. Hope to resume anticoagulation as soon as reasonable post operatively. 7. Discussed above risks and management extensively with patient and family.  As well as the need for operative management.   DVT prophylaxis: INR currently 2.7 Code Status: Full Family Communication: Family at bedside Consults called: Dr. Tamera Punt has seen patient Admission status: Admit to inpatient   Etta Quill DO [facility] Pager [phone] from 7PM-7AM  If 7AM-7PM, please contact the day physician for the  patient [email or web address] Password [facility]  12/17/2015, 11:05 PM

## 2015-12-18 ENCOUNTER — Encounter (HOSPITAL_COMMUNITY)
Admission: EM | Disposition: A | Discharge status: Skilled Nursing Facility | Payer: Self-pay | Source: Home / Self Care | Attending: Internal Medicine

## 2015-12-18 ENCOUNTER — Inpatient Hospital Stay (HOSPITAL_COMMUNITY): Payer: Medicare Other | Admitting: Certified Registered Nurse Anesthetist

## 2015-12-18 ENCOUNTER — Encounter (HOSPITAL_COMMUNITY): Payer: Self-pay | Admitting: Anesthesiology

## 2015-12-18 ENCOUNTER — Inpatient Hospital Stay (HOSPITAL_COMMUNITY): Payer: Medicare Other

## 2015-12-18 DIAGNOSIS — Z7901 Long term (current) use of anticoagulants: Secondary | ICD-10-CM

## 2015-12-18 DIAGNOSIS — Z954 Presence of other heart-valve replacement: Secondary | ICD-10-CM

## 2015-12-18 DIAGNOSIS — S72002A Fracture of unspecified part of neck of left femur, initial encounter for closed fracture: Secondary | ICD-10-CM

## 2015-12-18 HISTORY — PX: HIP ARTHROPLASTY: SHX981

## 2015-12-18 LAB — PROTIME-INR
INR: 1.88
INR: 1.62
Prothrombin Time: 21.9 seconds — ABNORMAL HIGH (ref 11.4–15.2)
Prothrombin Time: 19.4 seconds — ABNORMAL HIGH (ref 11.4–15.2)

## 2015-12-18 LAB — SURGICAL PCR SCREEN
MRSA, PCR: NEGATIVE
STAPHYLOCOCCUS AUREUS: NEGATIVE

## 2015-12-18 LAB — GLUCOSE, CAPILLARY: Glucose-Capillary: 131 mg/dL — ABNORMAL HIGH (ref 65–99)

## 2015-12-18 SURGERY — HEMIARTHROPLASTY, HIP, DIRECT ANTERIOR APPROACH, FOR FRACTURE
Anesthesia: General | Site: Hip | Laterality: Left

## 2015-12-18 MED ORDER — CEFAZOLIN SODIUM-DEXTROSE 2-4 GM/100ML-% IV SOLN
2.0000 g | Freq: Once | INTRAVENOUS | Status: AC
  Administered 2015-12-18: 2 g via INTRAVENOUS
  Filled 2015-12-18: qty 100

## 2015-12-18 MED ORDER — SUGAMMADEX SODIUM 200 MG/2ML IV SOLN
INTRAVENOUS | Status: AC
  Filled 2015-12-18: qty 2

## 2015-12-18 MED ORDER — ONDANSETRON HCL 4 MG PO TABS: 4.0000 mg | ORAL_TABLET | Freq: Four times a day (QID) | ORAL | Status: DC | PRN

## 2015-12-18 MED ORDER — SUGAMMADEX SODIUM 200 MG/2ML IV SOLN
INTRAVENOUS | Status: DC | PRN
  Administered 2015-12-18: 50 mg via INTRAVENOUS

## 2015-12-18 MED ORDER — CEFAZOLIN SODIUM-DEXTROSE 2-4 GM/100ML-% IV SOLN
2.0000 g | INTRAVENOUS | Status: AC
  Administered 2015-12-18: 2 g via INTRAVENOUS
  Filled 2015-12-18: qty 100

## 2015-12-18 MED ORDER — FENTANYL CITRATE (PF) 100 MCG/2ML IJ SOLN
INTRAMUSCULAR | Status: AC
  Filled 2015-12-18: qty 4

## 2015-12-18 MED ORDER — FENTANYL CITRATE (PF) 100 MCG/2ML IJ SOLN
50.0000 ug | Freq: Once | INTRAMUSCULAR | Status: AC
  Administered 2015-12-18: 50 ug via INTRAVENOUS
  Filled 2015-12-18: qty 2

## 2015-12-18 MED ORDER — CHLORHEXIDINE GLUCONATE 4 % EX LIQD
60.0000 mL | Freq: Once | CUTANEOUS | Status: AC
  Administered 2015-12-18: 4 via TOPICAL
  Filled 2015-12-18: qty 60

## 2015-12-18 MED ORDER — ONDANSETRON HCL 4 MG/2ML IJ SOLN
INTRAMUSCULAR | Status: AC
  Filled 2015-12-18: qty 2

## 2015-12-18 MED ORDER — EPHEDRINE 5 MG/ML INJ
INTRAVENOUS | Status: AC
  Filled 2015-12-18: qty 10

## 2015-12-18 MED ORDER — FENTANYL CITRATE (PF) 100 MCG/2ML IJ SOLN
INTRAMUSCULAR | Status: AC
  Filled 2015-12-18: qty 2

## 2015-12-18 MED ORDER — GLYCOPYRROLATE 0.2 MG/ML IV SOSY
PREFILLED_SYRINGE | INTRAVENOUS | Status: DC | PRN
  Administered 2015-12-18: 0.4 mg via INTRAVENOUS

## 2015-12-18 MED ORDER — ONDANSETRON HCL 4 MG/2ML IJ SOLN
INTRAMUSCULAR | Status: DC | PRN
  Administered 2015-12-18: 4 mg via INTRAVENOUS

## 2015-12-18 MED ORDER — WARFARIN SODIUM 5 MG PO TABS
5.0000 mg | ORAL_TABLET | Freq: Once | ORAL | Status: AC
  Administered 2015-12-18: 5 mg via ORAL
  Filled 2015-12-18: qty 1

## 2015-12-18 MED ORDER — PHENYLEPHRINE HCL 10 MG/ML IJ SOLN
INTRAVENOUS | Status: DC | PRN
  Administered 2015-12-18: 25 ug/min via INTRAVENOUS

## 2015-12-18 MED ORDER — PROPOFOL 10 MG/ML IV BOLUS
INTRAVENOUS | Status: AC
  Filled 2015-12-18: qty 20

## 2015-12-18 MED ORDER — FENTANYL CITRATE (PF) 100 MCG/2ML IJ SOLN
INTRAMUSCULAR | Status: DC | PRN
  Administered 2015-12-18: 50 ug via INTRAVENOUS
  Administered 2015-12-18 (×2): 25 ug via INTRAVENOUS

## 2015-12-18 MED ORDER — MENTHOL 3 MG MT LOZG
1.0000 | LOZENGE | OROMUCOSAL | Status: DC | PRN
  Administered 2015-12-18: 3 mg via ORAL
  Filled 2015-12-18: qty 9

## 2015-12-18 MED ORDER — ROCURONIUM BROMIDE 10 MG/ML (PF) SYRINGE
PREFILLED_SYRINGE | INTRAVENOUS | Status: DC | PRN
  Administered 2015-12-18: 40 mg via INTRAVENOUS

## 2015-12-18 MED ORDER — SUCCINYLCHOLINE CHLORIDE 200 MG/10ML IV SOSY
PREFILLED_SYRINGE | INTRAVENOUS | Status: AC
  Filled 2015-12-18: qty 10

## 2015-12-18 MED ORDER — LACTATED RINGERS IV SOLN
INTRAVENOUS | Status: DC
  Administered 2015-12-18: 14:00:00 via INTRAVENOUS

## 2015-12-18 MED ORDER — PHENOL 1.4 % MT LIQD: 1.0000 | OROMUCOSAL | Status: DC | PRN

## 2015-12-18 MED ORDER — METOCLOPRAMIDE HCL 5 MG PO TABS: 5.0000 mg | ORAL_TABLET | Freq: Three times a day (TID) | ORAL | Status: DC | PRN

## 2015-12-18 MED ORDER — WARFARIN - PHYSICIAN DOSING INPATIENT: Freq: Every day | Status: DC

## 2015-12-18 MED ORDER — FENTANYL CITRATE (PF) 100 MCG/2ML IJ SOLN
25.0000 ug | INTRAMUSCULAR | Status: DC | PRN
  Administered 2015-12-18 (×2): 25 ug via INTRAVENOUS

## 2015-12-18 MED ORDER — ACETAMINOPHEN 650 MG RE SUPP: 650.0000 mg | Freq: Four times a day (QID) | RECTAL | Status: DC | PRN

## 2015-12-18 MED ORDER — POVIDONE-IODINE 10 % EX SWAB: 2.0000 "application " | Freq: Once | CUTANEOUS | Status: DC

## 2015-12-18 MED ORDER — ACETAMINOPHEN 325 MG PO TABS: 650.0000 mg | ORAL_TABLET | Freq: Four times a day (QID) | ORAL | Status: DC | PRN

## 2015-12-18 MED ORDER — PROPOFOL 10 MG/ML IV BOLUS
INTRAVENOUS | Status: DC | PRN
  Administered 2015-12-18: 80 mg via INTRAVENOUS
  Administered 2015-12-18 (×2): 10 mg via INTRAVENOUS

## 2015-12-18 MED ORDER — LIDOCAINE 2% (20 MG/ML) 5 ML SYRINGE
INTRAMUSCULAR | Status: DC | PRN
  Administered 2015-12-18: 100 mg via INTRAVENOUS

## 2015-12-18 MED ORDER — ONDANSETRON HCL 4 MG/2ML IJ SOLN: 4.0000 mg | Freq: Four times a day (QID) | INTRAMUSCULAR | Status: DC | PRN

## 2015-12-18 MED ORDER — ALBUMIN HUMAN 5 % IV SOLN
INTRAVENOUS | Status: DC | PRN
  Administered 2015-12-18: 17:00:00 via INTRAVENOUS

## 2015-12-18 MED ORDER — LACTATED RINGERS IV SOLN: INTRAVENOUS | Status: DC

## 2015-12-18 MED ORDER — SODIUM CHLORIDE 0.9 % IR SOLN
Status: DC | PRN
  Administered 2015-12-18: 3000 mL

## 2015-12-18 MED ORDER — 0.9 % SODIUM CHLORIDE (POUR BTL) OPTIME
TOPICAL | Status: DC | PRN
  Administered 2015-12-18: 1000 mL

## 2015-12-18 MED ORDER — METOCLOPRAMIDE HCL 5 MG/ML IJ SOLN: 5.0000 mg | Freq: Three times a day (TID) | INTRAMUSCULAR | Status: DC | PRN

## 2015-12-18 SURGICAL SUPPLY — 62 items
BIT DRILL TWIST 2.7 (BIT) ×2 IMPLANT
BIT DRILL TWIST 2.7MM (BIT) ×1
BLADE SAW SAG 73X25 THK (BLADE) ×2
BLADE SAW SGTL 73X25 THK (BLADE) ×1 IMPLANT
BLADE SURG ROTATE 9660 (MISCELLANEOUS) IMPLANT
BRUSH FEMORAL CANAL (MISCELLANEOUS) ×3 IMPLANT
CAPT HIP HEMI 1 ×3 IMPLANT
CEMENT BONE DEPUY (Cement) ×6 IMPLANT
CEMENT RESTRICTOR DEPUY SZ 3 (Cement) ×6 IMPLANT
CHLORAPREP W/TINT 26ML (MISCELLANEOUS) ×3 IMPLANT
COVER SURGICAL LIGHT HANDLE (MISCELLANEOUS) ×3 IMPLANT
DRAPE IMP U-DRAPE 54X76 (DRAPES) ×3 IMPLANT
DRAPE INCISE IOBAN 66X45 STRL (DRAPES) IMPLANT
DRAPE ORTHO SPLIT 77X108 STRL (DRAPES) ×4
DRAPE PROXIMA HALF (DRAPES) ×3 IMPLANT
DRAPE SURG ORHT 6 SPLT 77X108 (DRAPES) ×2 IMPLANT
DRAPE U-SHAPE 47X51 STRL (DRAPES) ×3 IMPLANT
DRILL BIT 7/64X5 (BIT) ×3 IMPLANT
DRSG AQUACEL AG ADV 3.5X10 (GAUZE/BANDAGES/DRESSINGS) ×3 IMPLANT
ELECT BLADE 6.5 EXT (BLADE) IMPLANT
ELECT REM PT RETURN 9FT ADLT (ELECTROSURGICAL) ×3
ELECTRODE REM PT RTRN 9FT ADLT (ELECTROSURGICAL) ×1 IMPLANT
EVACUATOR 1/8 PVC DRAIN (DRAIN) IMPLANT
GAUZE XEROFORM 5X9 LF (GAUZE/BANDAGES/DRESSINGS) ×3 IMPLANT
GLOVE BIO SURGEON STRL SZ7 (GLOVE) ×3 IMPLANT
GLOVE BIO SURGEON STRL SZ7.5 (GLOVE) ×3 IMPLANT
GLOVE BIOGEL PI IND STRL 8 (GLOVE) ×1 IMPLANT
GLOVE BIOGEL PI INDICATOR 8 (GLOVE) ×2
GLOVE SS BIOGEL STRL SZ 7.5 (GLOVE) ×2 IMPLANT
GLOVE SS BIOGEL STRL SZ 8 (GLOVE) ×1 IMPLANT
GLOVE SUPERSENSE BIOGEL SZ 7.5 (GLOVE) ×4
GLOVE SUPERSENSE BIOGEL SZ 8 (GLOVE) ×2
GOWN STRL REUS W/ TWL LRG LVL3 (GOWN DISPOSABLE) ×4 IMPLANT
GOWN STRL REUS W/TWL LRG LVL3 (GOWN DISPOSABLE) ×8
HANDPIECE INTERPULSE COAX TIP (DISPOSABLE) ×2
HOOD PEEL AWAY FACE SHEILD DIS (HOOD) ×6 IMPLANT
IMMOBILIZER KNEE 22 UNIV (SOFTGOODS) ×3 IMPLANT
KIT BASIN OR (CUSTOM PROCEDURE TRAY) ×3 IMPLANT
KIT ROOM TURNOVER OR (KITS) ×3 IMPLANT
MANIFOLD NEPTUNE II (INSTRUMENTS) ×3 IMPLANT
NEEDLE MAYO TROCAR (NEEDLE) ×3 IMPLANT
NS IRRIG 1000ML POUR BTL (IV SOLUTION) ×3 IMPLANT
PACK TOTAL JOINT (CUSTOM PROCEDURE TRAY) ×3 IMPLANT
PACK UNIVERSAL I (CUSTOM PROCEDURE TRAY) ×3 IMPLANT
PAD ARMBOARD 7.5X6 YLW CONV (MISCELLANEOUS) ×6 IMPLANT
PASSER SUT SWANSON 36MM LOOP (INSTRUMENTS) ×3 IMPLANT
PRESSURIZER FEMORAL UNIV (MISCELLANEOUS) ×3 IMPLANT
SET HNDPC FAN SPRY TIP SCT (DISPOSABLE) ×1 IMPLANT
STAPLER VISISTAT 35W (STAPLE) ×3 IMPLANT
SUT ETHIBOND NAB CT1 #1 30IN (SUTURE) ×12 IMPLANT
SUT VIC AB 0 CT1 27 (SUTURE) ×2
SUT VIC AB 0 CT1 27XBRD ANBCTR (SUTURE) ×1 IMPLANT
SUT VIC AB 1 CTB1 27 (SUTURE) ×6 IMPLANT
SUT VIC AB 2-0 CT1 27 (SUTURE) ×2
SUT VIC AB 2-0 CT1 TAPERPNT 27 (SUTURE) ×1 IMPLANT
SUT VIC AB 2-0 FS1 27 (SUTURE) ×3 IMPLANT
TOWEL OR 17X24 6PK STRL BLUE (TOWEL DISPOSABLE) ×3 IMPLANT
TOWEL OR 17X26 10 PK STRL BLUE (TOWEL DISPOSABLE) ×3 IMPLANT
TOWER CARTRIDGE SMART MIX (DISPOSABLE) IMPLANT
TRAY FOLEY CATH 16FRSI W/METER (SET/KITS/TRAYS/PACK) ×3 IMPLANT
WATER STERILE IRR 1000ML POUR (IV SOLUTION) ×12 IMPLANT
YANKAUER SUCT BULB TIP NO VENT (SUCTIONS) ×3 IMPLANT

## 2015-12-18 NOTE — Anesthesia Procedure Notes (Signed)
Procedure Name: Intubation Date/Time: 12/18/2015 3:35 PM Performed by: Kyung Rudd Pre-anesthesia Checklist: Patient identified, Emergency Drugs available, Suction available, Patient being monitored and Timeout performed Patient Re-evaluated:Patient Re-evaluated prior to inductionOxygen Delivery Method: Circle system utilized Preoxygenation: Pre-oxygenation with 100% oxygen Intubation Type: IV induction Ventilation: Mask ventilation without difficulty Laryngoscope Size: Mac and 3 Grade View: Grade I Tube type: Oral Tube size: 7.0 mm Number of attempts: 1 Airway Equipment and Method: Stylet Placement Confirmation: ETT inserted through vocal cords under direct vision,  positive ETCO2 and breath sounds checked- equal and bilateral Secured at: 21 cm Tube secured with: Tape Dental Injury: Teeth and Oropharynx as per pre-operative assessment

## 2015-12-18 NOTE — Progress Notes (Signed)
Initial Nutrition Assessment  DOCUMENTATION CODES:   Not applicable  INTERVENTION:    Diet advancement as able post-op per MD.  RD to add PO supplements as needed.  NUTRITION DIAGNOSIS:   Increased nutrient needs related to acute illness as evidenced by estimated needs.  GOAL:   Patient will meet greater than or equal to 90% of their needs  MONITOR:   Diet advancement, PO intake, I & O's  REASON FOR ASSESSMENT:   Consult Hip fracture protocol  ASSESSMENT:   82 year old female who presented to the ED after a mechanical fall that occurred at home on 8/25. Admitted with L hip femoral neck fracture.  Labs reviewed. Medications reviewed and include MVI. Patient is currently in the OR for repair of hip fx. Unable to complete Nutrition-Focused physical exam at this time.   Diet Order:  Diet NPO time specified Except for: Sips with Meds Diet NPO time specified  Skin:  Reviewed, no issues  Last BM:  8/25  Height:   Ht Readings from Last 1 Encounters:  12/18/15 5\' 4"  (1.626 m)    Weight:   Wt Readings from Last 1 Encounters:  12/18/15 133 lb 9.6 oz (60.6 kg)    Ideal Body Weight:  54.5 kg  BMI:  Body mass index is 22.93 kg/m.  Estimated Nutritional Needs:   Kcal:  1550-1750  Protein:  75-90 gm  Fluid:  1.6-1.8 L  EDUCATION NEEDS:   No education needs identified at this time  Molli Barrows, [place], [place], [place] Pager [phone] After Hours Pager [phone]

## 2015-12-18 NOTE — ED Notes (Signed)
Attempted to call patient's daughter, no answer.

## 2015-12-18 NOTE — Progress Notes (Signed)
PROGRESS NOTE    Amanda Richardson  [id number] DOB: December 30, 1923 DOA: 12/17/2015 PCP: Merrilee Seashore, MD Brief Narrative: HPI: Amanda Richardson is a 82 y.o. female with medical history significant of mechanical mitral valve replacement, on coumadin.  Heparin allergy per family "almost killed her", per allergies list "anaphalaxis", A.Fib. Patient presented to the ED after a mechanical fall that occurred at home.  She was standing in kitchen and leg gave way. Found to have L hip femoral neck fx.  INR 2.67.  Assessment & Plan:   1. L hip fracture -s/p mechanical fall -stable, couamdin on hold, lasix dose Thursday, INR 2.6 yesterday, given Vitamin K, await INR this am before deciding regarding timing of surgery, today vs tomorrow  2. Mechanical MVR -coumadin on hold, unable to tolerate heparin or lovenox due to severe heparin allergy as a bridge peri-op -discussed risks during this time with family -resume coumadin immediately post op  3. Chronic diastolic CHF -euvolemic, lasix on hold, caution with IVF  4. HTN -continue lisinopril, atenolol  DVT prophylaxis: warfarin on hold Code Status: Full Code Family Communication:son and daughter at bedside Disposition Plan:will need SNF Consultants:  Ortho  Subjective: Feels ok, wants the surgery over with  Objective: Vitals:   12/18/15 0130 12/18/15 0230 12/18/15 0300 12/18/15 0425  BP: 167/80 165/92 167/64 (!) 162/66  Pulse: 62  76 68  Resp: 20 24 21 20   Temp:    98.4 F (36.9 C)  TempSrc:    Oral  SpO2: 94%  92% 97%  Weight:    60.6 kg (133 lb 9.6 oz)   No intake or output data in the 24 hours ending 12/18/15 0839 Filed Weights   12/18/15 0425  Weight: 60.6 kg (133 lb 9.6 oz)    Examination:  General exam: Appears calm and comfortable, AAOx3 Respiratory system: Clear to auscultation. Respiratory effort normal. Cardiovascular system: S1 & S2 heard, RRR, metallic click. No JVD, murmurs, rubs, gallops or clicks. 2plus  edema. Gastrointestinal system: Abdomen is nondistended, soft and nontender. No organomegaly or masses felt. Normal bowel sounds heard. Central nervous system: Alert and oriented. No focal neurological deficits. Extremities: Symmetric 5 x 5 power. Skin: No rashes, lesions or ulcers Psychiatry: Judgement and insight appear normal. Mood & affect appropriate.     Data Reviewed: I have personally reviewed following labs and imaging studies  CBC:  Recent Labs Lab 12/17/15 2041  WBC 7.8  NEUTROABS 6.6  HGB 12.8  HCT 36.4  MCV 89.2  PLT 123456*   Basic Metabolic Panel:  Recent Labs Lab 12/17/15 2041  NA 141  K 3.9  CL 107  CO2 26  GLUCOSE 146*  BUN 18  CREATININE 0.84  CALCIUM 9.1   GFR: CrCl cannot be calculated (Unknown ideal weight.). Liver Function Tests:  Recent Labs Lab 12/17/15 2041  AST 45*  ALT 28  ALKPHOS 64  BILITOT 1.5*  PROT 7.0  ALBUMIN 3.8   No results for input(s): LIPASE, AMYLASE in the last 168 hours. No results for input(s): AMMONIA in the last 168 hours. Coagulation Profile:  Recent Labs Lab 12/17/15 2041  INR 2.67   Cardiac Enzymes:  Recent Labs Lab 12/17/15 2041  TROPONINI <0.03   BNP (last 3 results) No results for input(s): PROBNP in the last 8760 hours. HbA1C: No results for input(s): HGBA1C in the last 72 hours. CBG:  Recent Labs Lab 12/18/15 0431  GLUCAP 131*   Lipid Profile: No results for input(s): CHOL, HDL, LDLCALC, TRIG, CHOLHDL, LDLDIRECT  in the last 72 hours. Thyroid Function Tests: No results for input(s): TSH, T4TOTAL, FREET4, T3FREE, THYROIDAB in the last 72 hours. Anemia Panel: No results for input(s): VITAMINB12, FOLATE, FERRITIN, TIBC, IRON, RETICCTPCT in the last 72 hours. Urine analysis:    Component Value Date/Time   COLORURINE ORANGE (A) 08/04/2014 1608   APPEARANCEUR CLOUDY (A) 08/04/2014 1608   LABSPEC 1.026 08/04/2014 1608   PHURINE 6.0 08/04/2014 1608   GLUCOSEU NEGATIVE 08/04/2014 1608    HGBUR LARGE (A) 08/04/2014 1608   BILIRUBINUR MODERATE (A) 08/04/2014 1608   KETONESUR NEGATIVE 08/04/2014 1608   PROTEINUR >300 (A) 08/04/2014 1608   UROBILINOGEN 4.0 (H) 08/04/2014 1608   NITRITE NEGATIVE 08/04/2014 1608   LEUKOCYTESUR TRACE (A) 08/04/2014 1608   Sepsis Labs: @LABRCNTIP (procalcitonin:4,lacticidven:4)  )No results found for this or any previous visit (from the past 240 hour(s)).       Radiology Studies: Dg Chest 1 View  Result Date: 12/17/2015 CLINICAL DATA:  Status post fall.  History of AFib and CHF. EXAM: CHEST 1 VIEW COMPARISON:  Chest radiograph 08/10/2014 and before FINDINGS: The heart remains massively enlarged. There is right retrocardiac consolidation. Previously seen pleural effusion on the right has resolved. No pneumothorax. IMPRESSION: 1. Massive cardiomegaly, unchanged. 2. Probable superimposed right lower lobe consolidation. This could indicate developing infection or atelectasis. Electronically Signed   By: Ulyses Jarred M.D.   On: 12/17/2015 20:52   Dg Hip Unilat W Or Wo Pelvis 2-3 Views Left  Result Date: 12/17/2015 CLINICAL DATA:  Fall with hip pain.  LEFT hip pain EXAM: DG HIP (WITH OR WITHOUT PELVIS) 2-3V LEFT COMPARISON:  None. FINDINGS: Subcapital fracture of the LEFT femoral neck with varus angulation. There is mild migration of the femur superiorly. IMPRESSION: Subcapital LEFT femoral neck fracture Electronically Signed   By: Suzy Bouchard M.D.   On: 12/17/2015 20:50        Scheduled Meds: . atenolol  50 mg Oral Daily  .  ceFAZolin (ANCEF) IV  2 g Intravenous To SS-Surg  . chlorhexidine  60 mL Topical Once  . digoxin  0.0625 mg Oral Daily  . levothyroxine  50 mcg Oral QAC breakfast  . lisinopril  10 mg Oral Daily  . multivitamin with minerals  1 tablet Oral QPM  . povidone-iodine  2 application Topical Once   Continuous Infusions: . sodium chloride 75 mL/hr at 12/18/15 0749     LOS: 1 day    Time spent: 97min    Domenic Polite, MD [facility] Pager [phone]  If 7PM-7AM, please contact night-coverage [email or web address] Password [facility] 12/18/2015, 8:39 AM

## 2015-12-18 NOTE — Anesthesia Postprocedure Evaluation (Signed)
Anesthesia Post Note  Patient: Amanda Richardson  Procedure(s) Performed: Procedure(s) (LRB): HEMIARTHROPLASTY  HIP (Left)  Patient location during evaluation: PACU Anesthesia Type: General Level of consciousness: sedated and patient cooperative Pain management: pain level controlled Vital Signs Assessment: post-procedure vital signs reviewed and stable Respiratory status: spontaneous breathing Cardiovascular status: stable Anesthetic complications: no    Last Vitals:  Vitals:   12/18/15 1745 12/18/15 1800  BP: (!) 130/59 (!) 141/105  Pulse: (!) 112 66  Resp: 13 15  Temp: 36.4 C     Last Pain:  Vitals:   12/18/15 1800  TempSrc:   PainSc: [place]

## 2015-12-18 NOTE — Interval H&P Note (Signed)
History and Physical Interval Note:  12/18/2015 2:34 PM  Amanda Richardson  has presented today for surgery, with the diagnosis of left hip fracture  The various methods of treatment have been discussed with the patient and family. After consideration of risks, benefits and other options for treatment, the patient has consented to  Procedure(s): HEMIARTHROPLASTY  HIP (Left) as a surgical intervention .  The patient's history has been reviewed, patient examined, no change in status, stable for surgery.  I have reviewed the patient's chart and labs.  Questions were answered to the patient's satisfaction.     [name]

## 2015-12-18 NOTE — Anesthesia Preprocedure Evaluation (Addendum)
Anesthesia Evaluation  Patient identified by MRN, date of birth, ID band Patient awake    Reviewed: Allergy & Precautions, NPO status , Patient's Chart, lab work & pertinent test results  Airway Mallampati: II  TM Distance: >3 FB Neck ROM: Full    Dental no notable dental hx.    Pulmonary shortness of breath, pneumonia,    Pulmonary exam normal breath sounds clear to auscultation       Cardiovascular hypertension, Pt. on medications + CAD and +CHF  Normal cardiovascular exam Rhythm:Regular Rate:Normal  Echo 2015 - Left ventricle: The cavity size was mildly dilated. Wall thickness was normal. Systolic function was normal. The estimatedejection fraction was in the range of 55% to 60%. - Aortic valve: There was moderate regurgitation. - Mitral valve: There was mild regurgitation. Valve area by pressure half-time: 2.44 cm^2. - Left atrium: The atrium was massively dilated. The appendage wasseverely dilated. - Right ventricle: The cavity size was moderately dilated. - Right atrium: The atrium was moderately to severely dilated. Theappendage was severely dilated.   Neuro/Psych negative neurological ROS  negative psych ROS   GI/Hepatic negative GI ROS, Neg liver ROS,   Endo/Other  negative endocrine ROSHypothyroidism   Renal/GU negative Renal ROS     Musculoskeletal negative musculoskeletal ROS (+)   Abdominal   Peds  Hematology negative hematology ROS (+) anemia ,   Anesthesia Other Findings   Reproductive/Obstetrics negative OB ROS                            Anesthesia Physical Anesthesia Plan  ASA: IV  Anesthesia Plan:    Post-op Pain Management:    Induction:   Airway Management Planned:   Additional Equipment:   Intra-op Plan:   Post-operative Plan:   Informed Consent: I have reviewed the patients History and Physical, chart, labs and discussed the procedure  including the risks, benefits and alternatives for the proposed anesthesia with the patient or authorized representative who has indicated his/her understanding and acceptance.   Dental advisory given  Plan Discussed with: CRNA  Anesthesia Plan Comments:         Anesthesia Quick Evaluation

## 2015-12-18 NOTE — Op Note (Signed)
Procedure(s): HEMIARTHROPLASTY  HIP Procedure Note  Amanda Richardson female 81 y.o. 12/18/2015  Procedure(s) and Anesthesia Type:    * LEFT HEMIARTHROPLASTY  HIP - General  Surgeon(s) and Role:    * Tania Ade, MD - Primary   Indications:  82 y.o. female s/p fall with left hip fracture. Indicated for surgery to promote early ambulation, pain control and prevent complications of bed rest.     Surgeon: [name]   Assistants: Jeanmarie Hubert PA-C (Danielle was present and scrubbed throughout the procedure and was essential in positioning, retraction, exposure, and closure)  Anesthesia: General endotracheal anesthesia    Procedure Detail  HEMIARTHROPLASTY  HIP  Findings: DePuy size 3 Summit basic cemented stem with a 48 head +0 neck excellent stability.  Estimated Blood Loss:  300 mL         Drains: none  Blood Given: none          Specimens: none        Complications:  * No complications entered in OR log *         Disposition: PACU - hemodynamically stable.         Condition: stable    Procedure:  The patient was identified in the preoperative  holding area where I personally marked the operative site after  verifying site side and procedure with the patient. She was taken back  to the operating room where general anesthesia was induced without  Complication. The patient was placed in lateral decubitus position with the  left side up. The left lower extremity was then prepped and draped in standard sterile fashion. The patient did receive IV antibiotics prior to the  incision.   After the appropriate time-out, an approximately 12-cm  incision was made over the posterior third of the greater trochanter.  Dissection was carried down to the fascia which was split longitudinally  in line with the incision. The piriformis was tagged and the external  rotators were then taken down off the posterior greater trochanter in 1  sheath with the  posterior capsule. The fracture was exposed. Posterior  capsule and external rotators were split just below the piriformis down  to the level of the acetabulum. Great care was taken to protect the  sciatic nerve. The proximal femoral cut was then made using the cut  guide and the femoral head was then removed and sized and felt to be a 48.  The proximal femur was then prepared by first using an intramedullary  canal finder, a lateralizer and then sequentially broaching from 1 to 3.   The size 48 head with +0 offset neck was then placed and a trial reduction was performed. It was felt  to be excellent in terms of the soft tissue tension. I was able to  bring up to 90 degrees of flexion and 70 degrees internal rotation  without any instability. Leg lengths were felt to be appropriate. The  hip was dislocated. The broach was then removed. The cement restrictor  was then placed and the canal was prepared with pulse lavage with a  brush. The size 3 stem was then cemented into place in approximately 15-20  degrees anteversion. It was held until the cement was hardened and  cool. The size 48 +0 offset head was then placed and impacted. It  was then reduced after ensuring that there was nothing in the  acetabulum. The hip reduced nicely and again it was taken through the  trial. I was able  to flex to 90 degrees, internal rotation to 70  without any instability. Soft tissue tension was excellent and lengths  were felt to be equal. The joint was then copiously irrigated with  normal saline with pulse lavage and then the external rotators were  repaired using Ethibond sutures through the greater trochanter and tied  over a bone bridge. The deep fascia was then closed using #1 Vicryl in  running fashion proximally and distally. The skin was then closed using  2-0 Vicryl in deep dermal layer and staples for skin closure. Sterile  dressings were then applied including Mepilex dressing. The patient was   then placed in an abduction pillow, rolled into supine position and  extubated. He was then transferred to the PACU in stable condition.    POSTOPERATIVE PLAN: The patient will be weightbearing as tolerated on the operative Extremity with posterior hip precautions and will have DVT prophylaxis of Coumadin which she is already on for a mechanical heart valve. This will be restarted tonight.

## 2015-12-18 NOTE — Transfer of Care (Signed)
Immediate Anesthesia Transfer of Care Note  Patient: Amanda Richardson  Procedure(s) Performed: Procedure(s): HEMIARTHROPLASTY  HIP (Left)  Patient Location: PACU  Anesthesia Type:General  Level of Consciousness: patient cooperative and responds to stimulation, follows commands  Airway & Oxygen Therapy: Patient Spontanous Breathing and Patient connected to nasal cannula oxygen  Post-op Assessment: Report given to RN and Post -op Vital signs reviewed and stable  Post vital signs: Reviewed and stable  Last Vitals:  Vitals:   12/18/15 1027 12/18/15 1745  BP: (!) 190/61   Pulse: 62   Resp: 17   Temp: 36.9 C (P) 36.4 C    Last Pain:  Vitals:   12/18/15 1027  TempSrc: Oral  PainSc:          Complications: No apparent anesthesia complications

## 2015-12-18 NOTE — Progress Notes (Signed)
ANTICOAGULATION CONSULT NOTE - Initial Consult  Pharmacy Consult for Coumadin Indication: atrial fibrillation / MVR  Allergies  Allergen Reactions  . Heparin Anaphylaxis    Patient Measurements: Height: 5\' 4"  (162.6 cm) Weight: 133 lb 9.6 oz (60.6 kg) IBW/kg (Calculated) : 54.7  Vital Signs: Temp: 97.5 F (36.4 C) (08/26 1745) Temp Source: Oral (08/26 1027) BP: 158/72 (08/26 1815) Pulse Rate: 43 (08/26 1820)  Labs:  Recent Labs  12/17/15 2041 12/18/15 0748 12/18/15 1430  HGB 12.8  --   --   HCT 36.4  --   --   PLT 119*  --   --   LABPROT 29.0* 21.9* 19.4*  INR 2.67 1.88 1.62  CREATININE 0.84  --   --   TROPONINI <0.03  --   --     Estimated Creatinine Clearance: 38.4 mL/min (by C-G formula based on SCr of 0.84 mg/dL).   Medical History: Past Medical History:  Diagnosis Date  . Allergic rhinitis   . Atrial fibrillation ([place])   . Basal cell carcinoma   . Bilateral lower extremity edema    history of venous insufficiency  . Bullous pemphigoid   . CHF (congestive heart failure) ([place])   . Chronic anemia    heavy periods  . Chronic anticoagulation   . Coronary artery disease   . DVT (deep venous thrombosis) ([place])    possible in 1990s and does not remember being on blood thinner  . Glaucoma   . H/O mitral valve replacement 2001   mechanical heart valve; family states indication may have been MR from MVP or  rheumatic heart disease  . Hypertension   . Hypothyroidism   . 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 S/p nephrectomy    left  . Shortness of breath dyspnea   . Thyroid disease   . Urinary incontinence     Assessment: 82 year old female s/p hip surgery now to resume Coumadin for Afib and MVR Dose PTA = 2.5 mg MWF, 5 mg other days  Goal of Therapy:  INR 2-3 Monitor platelets by anticoagulation protocol: Yes   Plan:  Coumadin 5 mg po x 1  Daily INR  Thank you Anette Guarneri, PharmD [phone]  12/18/2015,6:37 PM

## 2015-12-18 NOTE — ED Notes (Addendum)
Please call patient's daughter when she is moved to [facility].  [phone]

## 2015-12-18 NOTE — ED Notes (Signed)
Patient was given 1mg  dilaudid by [facility], patient SPO2 decreased to 85% on RA. Patient was placed on 2LNC, SPO2 increased to 93%.

## 2015-12-18 NOTE — Progress Notes (Signed)
   PATIENT ID: Amanda Richardson     Procedure(s) (LRB): HEMIARTHROPLASTY  HIP (Left)  Subjective: Doing well. Tells me she has no pain at rest, increased left hip pain with mvmt. No other complaints or concerns.  Objective:  Vitals:   12/18/15 0300 12/18/15 0425  BP: 167/64 (!) 162/66  Pulse: 76 68  Resp: 21 20  Temp:  98.4 F (36.9 C)     L hip slight ecchymosis L LE shortened and ext rotated Wiggles toes, distally NVI Awake, orientated  Labs:   Recent Labs  12/17/15 2041  HGB 12.8   Recent Labs  12/17/15 2041  WBC 7.8  RBC 4.08  HCT 36.4  PLT 119*   Recent Labs  12/17/15 2041  NA 141  K 3.9  CL 107  CO2 26  BUN 18  CREATININE 0.84  GLUCOSE 146*  CALCIUM 9.1    Assessment and Plan: 1 day s/p fall with L subcapital femoral neck fx Plan for L hip hemiarthroplasty Check INR around noon, if less than 2, proceed with sx today Anticoagulation on hold per medicine team and we appreciate their care of this high risk patient NWB Minimize pain medication, looks like she is not taking anything at this point and seems comfortable

## 2015-12-19 LAB — BASIC METABOLIC PANEL
Anion gap: 6 (ref 5–15)
BUN: 17 mg/dL (ref 6–20)
CHLORIDE: 108 mmol/L (ref 101–111)
CO2: 26 mmol/L (ref 22–32)
CREATININE: 1.04 mg/dL — AB (ref 0.44–1.00)
Calcium: 8.2 mg/dL — ABNORMAL LOW (ref 8.9–10.3)
GFR calc Af Amer: 53 mL/min — ABNORMAL LOW (ref >60)
GFR calc non Af Amer: 46 mL/min — ABNORMAL LOW (ref >60)
GLUCOSE: 131 mg/dL — AB (ref 65–99)
Potassium: 3.9 mmol/L (ref 3.5–5.1)
SODIUM: 140 mmol/L (ref 135–145)

## 2015-12-19 LAB — CBC
HCT: 27.5 % — ABNORMAL LOW (ref 36.0–46.0)
HEMOGLOBIN: 9.2 g/dL — AB (ref 12.0–15.0)
MCH: 30.7 pg (ref 26.0–34.0)
MCHC: 33.5 g/dL (ref 30.0–36.0)
MCV: 91.7 fL (ref 78.0–100.0)
PLATELETS: 111 10*3/uL — AB (ref 150–400)
RBC: 3 MIL/uL — ABNORMAL LOW (ref 3.87–5.11)
RDW: 14.2 % (ref 11.5–15.5)
WBC: 11.1 10*3/uL — ABNORMAL HIGH (ref 4.0–10.5)

## 2015-12-19 LAB — PROTIME-INR
INR: 1.69
PROTHROMBIN TIME: 20 s — AB (ref 11.4–15.2)

## 2015-12-19 MED ORDER — WARFARIN SODIUM 5 MG PO TABS
5.0000 mg | ORAL_TABLET | Freq: Once | ORAL | Status: AC
Start: 2015-12-19 — End: 2015-12-19
  Administered 2015-12-19: 5 mg via ORAL
  Filled 2015-12-19: qty 1

## 2015-12-19 MED ORDER — WARFARIN SODIUM 5 MG PO TABS: 5.0000 mg | ORAL_TABLET | Freq: Once | ORAL | Status: DC

## 2015-12-19 MED ORDER — SODIUM CHLORIDE 0.9 % IV SOLN: INTRAVENOUS | Status: AC

## 2015-12-19 MED ORDER — WARFARIN - PHARMACIST DOSING INPATIENT: Freq: Every day | Status: DC

## 2015-12-19 MED ORDER — SODIUM CHLORIDE 0.9 % IV BOLUS (SEPSIS)
250.0000 mL | Freq: Once | INTRAVENOUS | Status: AC
  Administered 2015-12-19: 250 mL via INTRAVENOUS

## 2015-12-19 MED ORDER — WARFARIN - PHYSICIAN DOSING INPATIENT: Freq: Every day | Status: DC

## 2015-12-19 NOTE — Evaluation (Signed)
Physical Therapy Evaluation Patient Details Name: Amanda Richardson MRN: [id number] DOB: 09/30/1923 Today's Date: 12/19/2015   History of Present Illness  Pt is a 82 y/o F who presented to the ED after a mechanical fall that occurred at home.  She was found to have a Lt hip femoral neck fx now s/p Lt hemiarthroplasty.  Pt's PMH includes a-fib, CHF, DVT, glaucoma, urinary incontinence, CABG.    Clinical Impression  Patient is s/p above surgery resulting in functional limitations due to the deficits listed below (see PT Problem List). PTA pt was Ind ambulating without AD and living alone.  She currently requires max assist for bed mobility and mod assist to pivot to chair.  Patient will benefit from skilled PT to increase their independence and safety with mobility to allow discharge to the venue listed below.        Follow Up Recommendations SNF;Supervision for mobility/OOB    Equipment Recommendations  Other (comment) (TBD at next venue of care)    Recommendations for Other Services OT consult     Precautions / Restrictions Precautions Precautions: Posterior Hip;Fall Precaution Booklet Issued: Yes (comment) Precaution Comments: Provided pt with and reviewed precaution handout.  Pt able to recall 1/3 precautions at end of session. Required Braces or Orthoses: Knee Immobilizer - Left Knee Immobilizer - Left: Other (comment) (KI on upon arrival, no specific orders) Restrictions Weight Bearing Restrictions: Yes LLE Weight Bearing: Weight bearing as tolerated      Mobility  Bed Mobility Overal bed mobility: Needs Assistance Bed Mobility: Supine to Sit     Supine to sit: Max assist;HOB elevated     General bed mobility comments: Cues for technique and use of bed rail.  Assist to elevate trunk, advance LEs, and use of bed pad for positioning.  Transfers Overall transfer level: Needs assistance Equipment used: Rolling walker (2 wheeled) Transfers: Sit to/from [employer] Sit to Stand: Mod assist Stand pivot transfers: Mod assist       General transfer comment: Cues for hand placement and technique.  Pt has very difficult time pivoting Lt LE and requires physical assist to do so.  Constant cues for upright posture and assist sliding Lt LE forward to prepare to sit.  Ambulation/Gait             General Gait Details: unable at this time  Stairs            Wheelchair Mobility    Modified Rankin (Stroke Patients Only)       Balance Overall balance assessment: Needs assistance;History of Falls Sitting-balance support: No upper extremity supported;Feet supported Sitting balance-Leahy Scale: Fair     Standing balance support: Bilateral upper extremity supported;During functional activity Standing balance-Leahy Scale: Poor Standing balance comment: Relies on RW and physical assist for support                             Pertinent Vitals/Pain Pain Assessment: Faces Faces Pain Scale: Hurts whole lot Pain Location: Lt hip and Bil groin pain (due to maceration) Pain Descriptors / Indicators: Grimacing;Aching;Guarding Pain Intervention(s): Limited activity within patient's tolerance;Monitored during session;Repositioned;Other (comment) (RN made aware of macerated skin in groin region)    Home Living Family/patient expects to be discharged to:: Skilled nursing facility Living Arrangements: Alone Available Help at Discharge: Family;Available PRN/intermittently Type of Home: House Home Access: Stairs to enter Entrance Stairs-Rails: Right Entrance Stairs-Number of Steps: 2 Home Layout: One level Home  Equipment: Gilford Rile - 2 wheels;Cane - single point      Prior Function Level of Independence: Independent         Comments: Pt takes a sponge bath.  Does not cook but rather does microwave meals.  Daughters do the driving.  Otherwise independent living alone.     Hand Dominance        Extremity/Trunk Assessment    Upper Extremity Assessment: Generalized weakness           Lower Extremity Assessment: LLE deficits/detail   LLE Deficits / Details: limited ROM and strength as expected s/p surgery listed above  Cervical / Trunk Assessment: Kyphotic  Communication   Communication: HOH  Cognition Arousal/Alertness: Awake/alert Behavior During Therapy: Anxious;WFL for tasks assessed/performed Overall Cognitive Status: Within Functional Limits for tasks assessed                      General Comments General comments (skin integrity, edema, etc.): SpO2 down to 88% on RA and [place] donned at 2L O2 as found.      Exercises General Exercises - Lower Extremity Ankle Circles/Pumps: AROM;Both;10 reps;Supine      Assessment/Plan    PT Assessment Patient needs continued PT services  PT Diagnosis Difficulty walking;Generalized weakness;Acute pain   PT Problem List Decreased strength;Decreased range of motion;Decreased activity tolerance;Decreased balance;Decreased mobility;Decreased knowledge of use of DME;Decreased safety awareness;Decreased knowledge of precautions;Cardiopulmonary status limiting activity;Pain;Decreased skin integrity  PT Treatment Interventions DME instruction;Gait training;Stair training;Functional mobility training;Therapeutic activities;Therapeutic exercise;Balance training;Neuromuscular re-education;Patient/family education;Modalities   PT Goals (Current goals can be found in the Care Plan section) Acute Rehab PT Goals Patient Stated Goal: rehab before home PT Goal Formulation: With patient/family Time For Goal Achievement: 01/02/16 Potential to Achieve Goals: Good    Frequency Min 3X/week   Barriers to discharge Decreased caregiver support Lives alone    Co-evaluation               End of Session Equipment Utilized During Treatment: Gait belt;Left knee immobilizer;Oxygen Activity Tolerance: Patient limited by pain;Patient limited by fatigue Patient left:  in chair;with call bell/phone within reach;with chair alarm set;with family/visitor present Nurse Communication: Mobility status;Other (comment);Precautions;Weight bearing status (macerated skin as documented above; SpO2)         Time: [id number] PT Time Calculation (min) (ACUTE ONLY): 41 min   Charges:   PT Evaluation $PT Eval Moderate Complexity: 1 Procedure PT Treatments $Therapeutic Exercise: 8-22 mins $Therapeutic Activity: 8-22 mins   PT G Codes:        Collie Siad PT, DPT  Pager: [phone] Phone: [phone] 12/19/2015, 1:41 PM

## 2015-12-19 NOTE — Discharge Instructions (Addendum)
Discharge Instructions after Hip Hemiarthroplasty   You can bear weight and ambulate as tolerated on both legs Observe posterior hip precautions Use ice on the hip intermittently over the first 48 hours after surgery.  Pain medicine has been prescribed for you.  Use your medicine liberally over the first 48 hours, and then you can begin to taper your use. You may take Extra Strength Tylenol or Tylenol only in place of the pain pills. DO NOT take ANY nonsteroidal anti-inflammatory pain medications: Advil, Motrin, Ibuprofen, Aleve, Naproxen or Naprosyn.  Take aspirin or anticoagulant medication as prescribed for 2 weeks after surgery. Please notify if allergic or sensitivity to aspirin. Okay to remove dressing in 5 days. Okay to shower in 5 days. Dressing changes as needed for first 5 days.    Please call [phone] during normal business hours or [phone] after hours for any problems. Including the following:  - excessive redness of the incisions - drainage for more than 4 days - fever of more than 101.5 F  *Please note that pain medications will not be refilled after hours or on weekends.   Information on my medicine - Coumadin   (Warfarin)  This medication education was reviewed with me or my healthcare representative as part of my discharge preparation.  The pharmacist that spoke with me during my hospital stay was:  Einar Grad, [facility]  Why was Coumadin prescribed for you? Coumadin was prescribed for you because you have a blood clot or a medical condition that can cause an increased risk of forming blood clots. Blood clots can cause serious health problems by blocking the flow of blood to the heart, lung, or brain. Coumadin can prevent harmful blood clots from forming. As a reminder your indication for Coumadin is:   Stroke Prevention Because Of Atrial Fibrillation  What test will check on my response to Coumadin? While on Coumadin (warfarin) you will need to have an INR  test regularly to ensure that your dose is keeping you in the desired range. The INR (international normalized ratio) number is calculated from the result of the laboratory test called prothrombin time (PT).  If an INR APPOINTMENT HAS NOT ALREADY BEEN MADE FOR YOU please schedule an appointment to have this lab work done by your health care provider within 7 days. Your INR goal is usually a number between:  2 to 3 or your provider may give you a more narrow range like 2-2.5.  Ask your health care provider during an office visit what your goal INR is.  What  do you need to  know  About  COUMADIN? Take Coumadin (warfarin) exactly as prescribed by your healthcare provider about the same time each day.  DO NOT stop taking without talking to the doctor who prescribed the medication.  Stopping without other blood clot prevention medication to take the place of Coumadin may increase your risk of developing a new clot or stroke.  Get refills before you run out.  What do you do if you miss a dose? If you miss a dose, take it as soon as you remember on the same day then continue your regularly scheduled regimen the next day.  Do not take two doses of Coumadin at the same time.  Important Safety Information A possible side effect of Coumadin (Warfarin) is an increased risk of bleeding. You should call your healthcare provider right away if you experience any of the following: ? Bleeding from an injury or your nose that does not  stop. ? Unusual colored urine (red or dark brown) or unusual colored stools (red or black). ? Unusual bruising for unknown reasons. ? A serious fall or if you hit your head (even if there is no bleeding).  Some foods or medicines interact with Coumadin (warfarin) and might alter your response to warfarin. To help avoid this: ? Eat a balanced diet, maintaining a consistent amount of Vitamin K. ? Notify your provider about major diet changes you plan to make. ? Avoid alcohol or limit  your intake to 1 drink for women and 2 drinks for men per day. (1 drink is 5 oz. wine, 12 oz. beer, or 1.5 oz. liquor.)  Make sure that ANY health care provider who prescribes medication for you knows that you are taking Coumadin (warfarin).  Also make sure the healthcare provider who is monitoring your Coumadin knows when you have started a new medication including herbals and non-prescription products.  Coumadin (Warfarin)  Major Drug Interactions  Increased Warfarin Effect Decreased Warfarin Effect  Alcohol (large quantities) Antibiotics (esp. Septra/Bactrim, Flagyl, Cipro) Amiodarone (Cordarone) Aspirin (ASA) Cimetidine (Tagamet) Megestrol (Megace) NSAIDs (ibuprofen, naproxen, etc.) Piroxicam (Feldene) Propafenone (Rythmol SR) Propranolol (Inderal) Isoniazid (INH) Posaconazole (Noxafil) Barbiturates (Phenobarbital) Carbamazepine (Tegretol) Chlordiazepoxide (Librium) Cholestyramine (Questran) Griseofulvin Oral Contraceptives Rifampin Sucralfate (Carafate) Vitamin K   Coumadin (Warfarin) Major Herbal Interactions  Increased Warfarin Effect Decreased Warfarin Effect  Garlic Ginseng Ginkgo biloba Coenzyme Q10 Green tea St. Johns wort    Coumadin (Warfarin) FOOD Interactions  Eat a consistent number of servings per week of foods HIGH in Vitamin K (1 serving =  cup)  Collards (cooked, or boiled & drained) Kale (cooked, or boiled & drained) Mustard greens (cooked, or boiled & drained) Parsley *serving size only =  cup Spinach (cooked, or boiled & drained) Swiss chard (cooked, or boiled & drained) Turnip greens (cooked, or boiled & drained)  Eat a consistent number of servings per week of foods MEDIUM-HIGH in Vitamin K (1 serving = 1 cup)  Asparagus (cooked, or boiled & drained) Broccoli (cooked, boiled & drained, or raw & chopped) Brussel sprouts (cooked, or boiled & drained) *serving size only =  cup Lettuce, raw (green leaf, endive, romaine) Spinach,  raw Turnip greens, raw & chopped   These websites have more information on Coumadin (warfarin):  FailFactory.se; VeganReport.com.au;

## 2015-12-19 NOTE — Progress Notes (Signed)
PROGRESS NOTE    Amanda Richardson  [id number] DOB: [birth year] DOA: 12/17/2015 PCP: Merrilee Seashore, MD Brief Narrative: HPI: Amanda Richardson is a 82 y.o. female with medical history significant of mechanical mitral valve replacement, on coumadin.  Heparin allergy per family "almost killed her", per allergies list "anaphalaxis", A.Fib. Patient presented to the ED after a mechanical fall that occurred at home.  She was standing in kitchen and leg gave way. Found to have L hip femoral neck fx.  INR 2.67.  Assessment & Plan:   1. L hip fracture -s/p mechanical fall -coumadin held and underwent L Hemiarthroplasty yesterday, coumadin resumed   2. Mechanical MVR -coumadin resumed, INR 1.69, unable to tolerate heparin or lovenox due to severe heparin allergy as a bridge peri-op -discussed risks during this time with family  3. Post op blood loss anemia -monitor Hb, no indication for transfusion yet -CBC in am  4. Chronic diastolic CHF -euvolemic, stop IVF -resume lasix tomorrow  5. HTN -hold lisinopril, continue atenolol  DVT prophylaxis: warfarin  Code Status: Full Code Family Communication:son and daughter at bedside Disposition Plan:will need SNF  Consultants:  Ortho  Subjective: Doing well, no complaints  Objective: Vitals:   12/18/15 1845 12/18/15 1951 12/19/15 0000 12/19/15 0414  BP: (!) 162/60 111/82 (!) 105/52 100/67  Pulse: 65 (!) 115 68 73  Resp: 10 16 16 16   Temp: 97.8 F (36.6 C) 97.8 F (36.6 C) 99 F (37.2 C) 98.4 F (36.9 C)  TempSrc:  Oral Oral Oral  SpO2: 98% 98% 98% 98%  Weight:      Height:        Intake/Output Summary (Last 24 hours) at 12/19/15 1210 Last data filed at 12/19/15 0900  Gross per 24 hour  Intake             1340 ml  Output              650 ml  Net              690 ml   Filed Weights   12/18/15 0425  Weight: 60.6 kg (133 lb 9.6 oz)    Examination:  General exam: Appears calm and comfortable, AAOx3 Respiratory  system: Clear to auscultation. Respiratory effort normal. Cardiovascular system: S1 & S2 heard, RRR, metallic click. No JVD, murmurs, rubs, gallops or clicks. 2plus edema. Gastrointestinal system: Abdomen is nondistended, soft and nontender. No organomegaly or masses felt. Normal bowel sounds heard. Central nervous system: Alert and oriented. No focal neurological deficits. Extremities: Symmetric 5 x 5 power. Skin: No rashes, lesions or ulcers Psychiatry: Judgement and insight appear normal. Mood & affect appropriate.     Data Reviewed: I have personally reviewed following labs and imaging studies  CBC:  Recent Labs Lab 12/17/15 2041 12/19/15 0348  WBC 7.8 11.1*  NEUTROABS 6.6  --   HGB 12.8 9.2*  HCT 36.4 27.5*  MCV 89.2 91.7  PLT 119* [id number]*   Basic Metabolic Panel:  Recent Labs Lab 12/17/15 2041 12/19/15 0348  NA 141 140  K 3.9 3.9  CL 107 108  CO2 26 26  GLUCOSE 146* 131*  BUN 18 17  CREATININE 0.84 1.04*  CALCIUM 9.1 8.2*   GFR: Estimated Creatinine Clearance: 31 mL/min (by C-G formula based on SCr of 1.04 mg/dL). Liver Function Tests:  Recent Labs Lab 12/17/15 2041  AST 45*  ALT 28  ALKPHOS 64  BILITOT 1.5*  PROT 7.0  ALBUMIN 3.8   No results for  input(s): LIPASE, AMYLASE in the last 168 hours. No results for input(s): AMMONIA in the last 168 hours. Coagulation Profile:  Recent Labs Lab 12/17/15 2041 12/18/15 0748 12/18/15 1430 12/19/15 0348  INR 2.67 1.88 1.62 1.69   Cardiac Enzymes:  Recent Labs Lab 12/17/15 2041  TROPONINI <0.03   BNP (last 3 results) No results for input(s): PROBNP in the last 8760 hours. HbA1C: No results for input(s): HGBA1C in the last 72 hours. CBG:  Recent Labs Lab 12/18/15 0431  GLUCAP 131*   Lipid Profile: No results for input(s): CHOL, HDL, LDLCALC, TRIG, CHOLHDL, LDLDIRECT in the last 72 hours. Thyroid Function Tests: No results for input(s): TSH, T4TOTAL, FREET4, T3FREE, THYROIDAB in the last 72  hours. Anemia Panel: No results for input(s): VITAMINB12, FOLATE, FERRITIN, TIBC, IRON, RETICCTPCT in the last 72 hours. Urine analysis:    Component Value Date/Time   COLORURINE ORANGE (A) 08/04/2014 1608   APPEARANCEUR CLOUDY (A) 08/04/2014 1608   LABSPEC 1.026 08/04/2014 1608   PHURINE 6.0 08/04/2014 1608   GLUCOSEU NEGATIVE 08/04/2014 1608   HGBUR LARGE (A) 08/04/2014 1608   BILIRUBINUR MODERATE (A) 08/04/2014 1608   KETONESUR NEGATIVE 08/04/2014 1608   PROTEINUR >300 (A) 08/04/2014 1608   UROBILINOGEN 4.0 (H) 08/04/2014 1608   NITRITE NEGATIVE 08/04/2014 1608   LEUKOCYTESUR TRACE (A) 08/04/2014 1608   Sepsis Labs: @LABRCNTIP (procalcitonin:4,lacticidven:4)  ) Recent Results (from the past 240 hour(s))  Surgical pcr screen     Status: None   Collection Time: 12/18/15  7:33 AM  Result Value Ref Range Status   MRSA, PCR NEGATIVE NEGATIVE Final   Staphylococcus aureus NEGATIVE NEGATIVE Final    Comment:        The Xpert SA Assay (FDA approved for NASAL specimens in patients over 42 years of age), is one component of a comprehensive surveillance program.  Test performance has been validated by [facility] for patients greater than or equal to 101 year old. It is not intended to diagnose infection nor to guide or monitor treatment.          Radiology Studies: Dg Chest 1 View  Result Date: 12/17/2015 CLINICAL DATA:  Status post fall.  History of AFib and CHF. EXAM: CHEST 1 VIEW COMPARISON:  Chest radiograph 08/10/2014 and before FINDINGS: The heart remains massively enlarged. There is right retrocardiac consolidation. Previously seen pleural effusion on the right has resolved. No pneumothorax. IMPRESSION: 1. Massive cardiomegaly, unchanged. 2. Probable superimposed right lower lobe consolidation. This could indicate developing infection or atelectasis. Electronically Signed   By: Ulyses Jarred M.D.   On: 12/17/2015 20:52   Pelvis Portable  Result Date:  12/18/2015 CLINICAL DATA:  Post left hip hemiarthroplasty EXAM: PORTABLE PELVIS 1-2 VIEWS COMPARISON:  CT urogram 03/29/2005 FINDINGS: Postoperative left hip hemiarthroplasty using cemented femoral component. Components appear well seated. No evidence of acute fracture or dislocation. Skin clips and subcutaneous soft tissue gas consistent with recent surgery. IMPRESSION: Postoperative left hip hemiarthroplasty. Component appears well seated. Electronically Signed   By: Lucienne Capers M.D.   On: 12/18/2015 19:14   Dg Hip Unilat W Or Wo Pelvis 2-3 Views Left  Result Date: 12/17/2015 CLINICAL DATA:  Fall with hip pain.  LEFT hip pain EXAM: DG HIP (WITH OR WITHOUT PELVIS) 2-3V LEFT COMPARISON:  None. FINDINGS: Subcapital fracture of the LEFT femoral neck with varus angulation. There is mild migration of the femur superiorly. IMPRESSION: Subcapital LEFT femoral neck fracture Electronically Signed   By: Helane Gunther.D.  On: 12/17/2015 20:50        Scheduled Meds: . atenolol  50 mg Oral Daily  . digoxin  0.0625 mg Oral Daily  . levothyroxine  50 mcg Oral QAC breakfast  . multivitamin with minerals  1 tablet Oral QPM  . warfarin  5 mg Oral ONCE-1800  . Warfarin - Pharmacist Dosing Inpatient   Does not apply q1800   Continuous Infusions:     LOS: 2 days    Time spent: 53min    Domenic Polite, MD [facility] Pager [phone]  If 7PM-7AM, please contact night-coverage [email or web address] Password [email or web address] 12/19/2015, 12:10 PM

## 2015-12-19 NOTE — Progress Notes (Signed)
   PATIENT ID: Amanda Richardson   1 Day Post-Op Procedure(s) (LRB): HEMIARTHROPLASTY  HIP (Left)  Subjective: Doing well, minimal pain. Well controlled overnight. Comfortable in bed. No complaints or concerns.   Objective:  Vitals:   12/19/15 0000 12/19/15 0414  BP: (!) 105/52 100/67  Pulse: 68 73  Resp: 16 16  Temp: 99 F (37.2 C) 98.4 F (36.9 C)     L hip dressing with some dried blood, intact No surround erythema, swelling, warmth Wiggles toes, distally NVI  Labs:   Recent Labs  12/17/15 2041 12/19/15 0348  HGB 12.8 9.2*   Recent Labs  12/17/15 2041 12/19/15 0348  WBC 7.8 11.1*  RBC 4.08 3.00*  HCT 36.4 27.5*  PLT 119* 111*   Recent Labs  12/17/15 2041 12/19/15 0348  NA 141 140  K 3.9 3.9  CL 107 108  CO2 26 26  BUN 18 17  CREATININE 0.84 1.04*  GLUCOSE 146* 131*  CALCIUM 9.1 8.2*    Assessment and Plan: 1 day s/p left hip hemiarthroplasty for fracture ABLA- expected, will continue to observe Continue to minimize narcotics, norco or tylenol prn Up with PT today Coumadin per pharmacy Posterior hip precautions WBAT

## 2015-12-19 NOTE — Progress Notes (Signed)
ANTICOAGULATION CONSULT NOTE - Follow-up Consult  Pharmacy Consult for Coumadin Indication: atrial fibrillation / MVR  Allergies  Allergen Reactions  . Heparin Anaphylaxis    Patient Measurements: Height: 5\' 4"  (162.6 cm) Weight: 133 lb 9.6 oz (60.6 kg) IBW/kg (Calculated) : 54.7  Vital Signs: Temp: 98.4 F (36.9 C) (08/27 0414) Temp Source: Oral (08/27 0414) BP: 100/67 (08/27 0414) Pulse Rate: 73 (08/27 0414)  Labs:  Recent Labs  12/17/15 2041 12/18/15 0748 12/18/15 1430 12/19/15 0348  HGB 12.8  --   --  9.2*  HCT 36.4  --   --  27.5*  PLT 119*  --   --  111*  LABPROT 29.0* 21.9* 19.4* 20.0*  INR 2.67 1.88 1.62 1.69  CREATININE 0.84  --   --  1.04*  TROPONINI <0.03  --   --   --     Estimated Creatinine Clearance: 31 mL/min (by C-G formula based on SCr of 1.04 mg/dL).   Medical History: Past Medical History:  Diagnosis Date  . Allergic rhinitis   . Atrial fibrillation ([place])   . Basal cell carcinoma   . Bilateral lower extremity edema    history of venous insufficiency  . Bullous pemphigoid   . CHF (congestive heart failure) ([place])   . Chronic anemia    heavy periods  . Chronic anticoagulation   . Coronary artery disease   . DVT (deep venous thrombosis) ([place])    possible in 1990s and does not remember being on blood thinner  . Glaucoma   . H/O mitral valve replacement 2001   mechanical heart valve; family states indication may have been MR from MVP or  rheumatic heart disease  . Hypertension   . Hypothyroidism   . 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 S/p nephrectomy    left  . Shortness of breath dyspnea   . Thyroid disease   . Urinary incontinence     Assessment: Warfarin PTA for AFib and MVR (goal = 2.5-3.5 per prior notes) held on admit for surgery and given vitamin k 1 mg x1, warfarin restarted 8/26 PM with no bridge given heparin allergy. Hgb 9.2, plt 111 - no S/Sx bleeding noted. INR subtherapeutic today at 1.69. SCDs on  as well.  **PTA dosing = 2.5 mg MWF, 5 mg AOD  Goal of Therapy:  INR 2.5-3.5 Monitor platelets by anticoagulation protocol: Yes   Plan:  -Warfarin 5 mg x1 -Daily INR, monitor S/Sx bleeding and CBC -Warfarin education  Arrie Senate, PharmD PGY-1 Pharmacy Resident Pager: [phone] 12/19/2015

## 2015-12-20 ENCOUNTER — Encounter (HOSPITAL_COMMUNITY): Payer: Self-pay | Admitting: Orthopedic Surgery

## 2015-12-20 DIAGNOSIS — S72002D Fracture of unspecified part of neck of left femur, subsequent encounter for closed fracture with routine healing: Secondary | ICD-10-CM

## 2015-12-20 LAB — BASIC METABOLIC PANEL
Anion gap: 7 (ref 5–15)
BUN: 26 mg/dL — AB (ref 6–20)
CHLORIDE: 105 mmol/L (ref 101–111)
CO2: 25 mmol/L (ref 22–32)
CREATININE: 1.22 mg/dL — AB (ref 0.44–1.00)
Calcium: 7.9 mg/dL — ABNORMAL LOW (ref 8.9–10.3)
GFR calc Af Amer: 44 mL/min — ABNORMAL LOW (ref >60)
GFR, EST NON AFRICAN AMERICAN: 38 mL/min — AB (ref >60)
GLUCOSE: 129 mg/dL — AB (ref 65–99)
POTASSIUM: 3.8 mmol/L (ref 3.5–5.1)
Sodium: 137 mmol/L (ref 135–145)

## 2015-12-20 LAB — CBC
HCT: 22.4 % — ABNORMAL LOW (ref 36.0–46.0)
Hemoglobin: 7.4 g/dL — ABNORMAL LOW (ref 12.0–15.0)
MCH: 30.6 pg (ref 26.0–34.0)
MCHC: 33 g/dL (ref 30.0–36.0)
MCV: 92.6 fL (ref 78.0–100.0)
PLATELETS: 85 10*3/uL — AB (ref 150–400)
RBC: 2.42 MIL/uL — AB (ref 3.87–5.11)
RDW: 14.2 % (ref 11.5–15.5)
WBC: 10.9 10*3/uL — ABNORMAL HIGH (ref 4.0–10.5)

## 2015-12-20 LAB — PREPARE RBC (CROSSMATCH)

## 2015-12-20 LAB — PROTIME-INR
INR: 2.11
Prothrombin Time: 24 seconds — ABNORMAL HIGH (ref 11.4–15.2)

## 2015-12-20 LAB — ABO/RH: ABO/RH(D): AB POS

## 2015-12-20 MED ORDER — WARFARIN SODIUM 5 MG PO TABS
2.5000 mg | ORAL_TABLET | Freq: Once | ORAL | Status: AC
  Administered 2015-12-20: 2.5 mg via ORAL
  Filled 2015-12-20: qty 1

## 2015-12-20 MED ORDER — POLYETHYLENE GLYCOL 3350 17 G PO PACK
17.0000 g | PACK | Freq: Every day | ORAL | Status: DC
  Administered 2015-12-20 – 2015-12-22 (×3): 17 g via ORAL
  Filled 2015-12-20 (×2): qty 1

## 2015-12-20 MED ORDER — HYDROCODONE-ACETAMINOPHEN 5-325 MG PO TABS: 1.0000 | ORAL_TABLET | Freq: Four times a day (QID) | ORAL | 0 refills | Status: DC | PRN

## 2015-12-20 MED ORDER — SODIUM CHLORIDE 0.9 % IV SOLN
Freq: Once | INTRAVENOUS | Status: DC
Start: 2015-12-20 — End: 2015-12-21

## 2015-12-20 MED ORDER — SENNOSIDES-DOCUSATE SODIUM 8.6-50 MG PO TABS
1.0000 | ORAL_TABLET | Freq: Two times a day (BID) | ORAL | Status: DC
  Administered 2015-12-20 – 2015-12-22 (×5): 1 via ORAL
  Filled 2015-12-20 (×5): qty 1

## 2015-12-20 MED ORDER — SODIUM CHLORIDE 0.9 % IV SOLN
Freq: Once | INTRAVENOUS | Status: AC
  Administered 2015-12-20: 09:00:00 via INTRAVENOUS

## 2015-12-20 MED ORDER — FUROSEMIDE 10 MG/ML IJ SOLN
40.0000 mg | Freq: Once | INTRAMUSCULAR | Status: AC
  Administered 2015-12-20: 40 mg via INTRAVENOUS
  Filled 2015-12-20: qty 4

## 2015-12-20 MED FILL — Hydromorphone HCl Inj 2 MG/ML: INTRAMUSCULAR | Qty: 1 | Status: AC

## 2015-12-20 NOTE — NC FL2 (Signed)
[place] LEVEL OF CARE SCREENING TOOL     IDENTIFICATION  Patient Name: Amanda Richardson Birthdate: 08-11-1923 Sex: female Admission Date (Current Location): 12/17/2015  [facility] and [place] Number:  Herbalist and Address:  [facility], [place] [address], [place], [place] [address]      Provider Number: [id number]  Attending Physician Name and Address:  Domenic Polite, MD  Relative Name and Phone Number:       Current Level of Care: Hospital Recommended Level of Care: [place] Prior Approval Number:    Date Approved/Denied:   PASRR Number:  ([id number])  Discharge Plan: SNF    Current Diagnoses: Patient Active Problem List   Diagnosis Date Noted  . Left displaced femoral neck fracture ([place]) 12/17/2015  . Transaminitis 08/11/2014  . CAP (community acquired pneumonia) 08/04/2014  . Acute on chronic diastolic heart failure ([place]) 08/04/2014  . Subtherapeutic anticoagulation 08/04/2014  . Edema extremities- chronic. Seen by Dr Debara Pickett 10/13 11/13/2012  . Cellulitis 03/01/2012  . Fever 03/01/2012  . H/O mechanical mitral valve replacement 2001   . Thyroid disease   . Hypertension   . CHF (congestive heart failure) ([place])   . S/p nephrectomy   . Coronary artery disease   . H/O coronary artery bypass surgery   . Ischemic bowel disease ([place])   . Urinary incontinence   . Chronic atrial fibrillation ([place])   . DVT (deep venous thrombosis) ([place])   . Hypothyroidism   . Chronic anemia   . Glaucoma   . Bullous pemphigoid   . Chronic anticoagulation   . Basal cell carcinoma   . Allergic rhinitis     Orientation RESPIRATION BLADDER Height & Weight     Self, Time, Situation, Place  Normal Incontinent Weight: 133 lb 9.6 oz (60.6 kg) Height:  5\' 4"  (162.6 cm)  BEHAVIORAL SYMPTOMS/MOOD NEUROLOGICAL BOWEL NUTRITION STATUS      Incontinent    AMBULATORY STATUS COMMUNICATION OF NEEDS Skin   Limited Assist Verbally  Normal                       Personal Care Assistance Level of Assistance  Bathing, Dressing Bathing Assistance: Limited assistance Feeding assistance: Independent Dressing Assistance: Limited assistance     Functional Limitations Info             SPECIAL CARE FACTORS FREQUENCY                       Contractures      Additional Factors Info                  Current Medications (12/20/2015):  This is the current hospital active medication list Current Facility-Administered Medications  Medication Dose Route Frequency Provider Last Rate Last Dose  . 0.9 %  sodium chloride infusion   Intravenous Once Domenic Polite, MD      . acetaminophen (TYLENOL) tablet 650 mg  650 mg Oral Q6H PRN Grier Mitts, PA-C       Or  . acetaminophen (TYLENOL) suppository 650 mg  650 mg Rectal Q6H PRN Grier Mitts, PA-C      . atenolol (TENORMIN) tablet 50 mg  50 mg Oral Daily Etta Quill, DO   50 mg at 12/20/15 0851  . digoxin (LANOXIN) tablet 0.0625 mg  0.0625 mg Oral Daily Etta Quill, DO   0.0625 mg at 12/20/15 [id number]  . furosemide (  LASIX) injection 40 mg  40 mg Intravenous Once Domenic Polite, MD      . HYDROcodone-acetaminophen (NORCO/VICODIN) 5-325 MG per tablet 1-2 tablet  1-2 tablet Oral Q6H PRN Etta Quill, DO   2 tablet at 12/19/15 1049  . levothyroxine (SYNTHROID, LEVOTHROID) tablet 50 mcg  50 mcg Oral QAC breakfast Etta Quill, DO   50 mcg at 12/20/15 [id number]  . menthol-cetylpyridinium (CEPACOL) lozenge 3 mg  1 lozenge Oral PRN Grier Mitts, PA-C   3 mg at 12/18/15 2159   Or  . phenol (CHLORASEPTIC) mouth spray 1 spray  1 spray Mouth/Throat PRN Grier Mitts, PA-C      . morphine 2 MG/ML injection 0.5 mg  0.5 mg Intravenous Q2H PRN Etta Quill, DO      . multivitamin with minerals tablet 1 tablet  1 tablet Oral QPM Etta Quill, DO   1 tablet at 12/19/15 2224  . ondansetron (ZOFRAN) tablet 4 mg  4 mg Oral Q6H PRN Grier Mitts, PA-C       Or  . ondansetron (ZOFRAN) injection 4 mg  4 mg Intravenous Q6H PRN Grier Mitts, PA-C      . polyethylene glycol (MIRALAX / GLYCOLAX) packet 17 g  17 g Oral Daily Domenic Polite, MD   17 g at 12/20/15 0851  . senna-docusate (Senokot-S) tablet 1 tablet  1 tablet Oral BID Domenic Polite, MD   1 tablet at 12/20/15 [phone]  . warfarin (COUMADIN) tablet 2.5 mg  2.5 mg Oral ONCE-1800 Kris Mouton, [facility]      . Warfarin - Pharmacist Dosing Inpatient   Does not apply [id number] Domenic Polite, MD         Discharge Medications: Please see discharge summary for a list of discharge medications.  Relevant Imaging Results:  Relevant Lab Results:   Additional Information    Venetia Maxon, [place] R

## 2015-12-20 NOTE — Progress Notes (Signed)
OT Cancellation Note  Patient Details Name: Amanda Richardson MRN: [id number] DOB: Sep 20, 1923   Cancelled Treatment:    Reason Eval/Treat Not Completed: Medical issues which prohibited therapy. Pt had hemoglobin level of 7.4, and was being prepared to receive 2 units of blood.  [place] 12/20/2015, 12:13 PM   Hulda Humphrey OTR/L [phone]

## 2015-12-20 NOTE — Care Management Important Message (Signed)
Important Message  Patient Details  Name: Amanda Richardson MRN: [id number] Date of Birth: [birth year]   Medicare Important Message Given:  Yes    [name] 12/20/2015, 12:12 PM

## 2015-12-20 NOTE — Progress Notes (Signed)
PROGRESS NOTE    Amanda Richardson  [id number] DOB: 16-May-1923 DOA: 12/17/2015 PCP: Merrilee Seashore, MD Brief Narrative: HPI: Amanda Richardson is a 82 y.o. female with medical history significant of mechanical mitral valve replacement, on coumadin.  Heparin allergy per family "almost killed her", per allergies list "anaphalaxis", A.Fib. Patient presented to the ED after a mechanical fall that occurred at home.  She was standing in kitchen and leg gave way. Found to have L hip femoral neck fx.  INR 2.67.  Assessment & Plan:   1. L hip fracture -s/p mechanical fall -coumadin held and underwent L Hemiarthroplasty yesterday, coumadin resumed yesterday post op  2. Mechanical MVR -coumadin resumed, INR 1.69, unable to tolerate heparin or lovenox due to severe heparin allergy as a bridge peri-op -discussed risks during this time with family  3. Post op blood loss anemia -no overt bleeding and no bleeding at surgical site -Hb down to 7.4 today, will transfuse 2units PRBC with lasix  4. Chronic diastolic CHF -euvolemic, stop IVF -lasix with PRBC today  5. HTN -hold lisinopril, continue atenolol  DVT prophylaxis: warfarin  Code Status: Full Code Family Communication:son and daughter at bedside yesterday Disposition Plan:will need SNF ? 2days  Consultants:  Ortho  Subjective: Doing well, no complaints  Objective: Vitals:   12/19/15 1300 12/19/15 1827 12/19/15 2024 12/20/15 0428  BP: (!) 77/37 (!) 87/48 (!) 94/51 (!) 110/55  Pulse: 76  85 87  Resp: 17 18    Temp: 98.7 F (37.1 C) 98.3 F (36.8 C) 98.2 F (36.8 C) 98.6 F (37 C)  TempSrc: Oral Oral Oral Oral  SpO2: 98% 98% 98% 100%  Weight:      Height:        Intake/Output Summary (Last 24 hours) at 12/20/15 1214 Last data filed at 12/20/15 0817  Gross per 24 hour  Intake              360 ml  Output              350 ml  Net               10 ml   Filed Weights   12/18/15 0425  Weight: 60.6 kg (133 lb 9.6  oz)    Examination:  General exam: Appears calm and comfortable, AAOx3 Respiratory system: Clear to auscultation. Respiratory effort normal. Cardiovascular system: S1 & S2 heard, RRR, metallic click. No JVD, murmurs, rubs, gallops or clicks. 1-2plus edema. Gastrointestinal system: Abdomen is nondistended, soft and nontender. No organomegaly or masses felt. Normal bowel sounds heard. Central nervous system: No focal neurological deficits. Extremities: Symmetric 5 x 5 power, L hip dressing noted Skin: No rashes, lesions or ulcers Psychiatry: Judgement and insight appear normal. Mood & affect appropriate.     Data Reviewed: I have personally reviewed following labs and imaging studies  CBC:  Recent Labs Lab 12/17/15 2041 12/19/15 0348 12/20/15 0258  WBC 7.8 11.1* 10.9*  NEUTROABS 6.6  --   --   HGB 12.8 9.2* 7.4*  HCT 36.4 27.5* 22.4*  MCV 89.2 91.7 92.6  PLT 119* 111* 85*   Basic Metabolic Panel:  Recent Labs Lab 12/17/15 2041 12/19/15 0348 12/20/15 0258  NA 141 140 137  K 3.9 3.9 3.8  CL 107 108 105  CO2 26 26 25   GLUCOSE 146* 131* 129*  BUN 18 17 26*  CREATININE 0.84 1.04* 1.22*  CALCIUM 9.1 8.2* 7.9*   GFR: Estimated Creatinine Clearance: 26.5 mL/min (by  C-G formula based on SCr of 1.22 mg/dL). Liver Function Tests:  Recent Labs Lab 12/17/15 2041  AST 45*  ALT 28  ALKPHOS 64  BILITOT 1.5*  PROT 7.0  ALBUMIN 3.8   No results for input(s): LIPASE, AMYLASE in the last 168 hours. No results for input(s): AMMONIA in the last 168 hours. Coagulation Profile:  Recent Labs Lab 12/17/15 2041 12/18/15 0748 12/18/15 1430 12/19/15 0348 12/20/15 0258  INR 2.67 1.88 1.62 1.69 2.11   Cardiac Enzymes:  Recent Labs Lab 12/17/15 2041  TROPONINI <0.03   BNP (last 3 results) No results for input(s): PROBNP in the last 8760 hours. HbA1C: No results for input(s): HGBA1C in the last 72 hours. CBG:  Recent Labs Lab 12/18/15 0431  GLUCAP 131*    Lipid Profile: No results for input(s): CHOL, HDL, LDLCALC, TRIG, CHOLHDL, LDLDIRECT in the last 72 hours. Thyroid Function Tests: No results for input(s): TSH, T4TOTAL, FREET4, T3FREE, THYROIDAB in the last 72 hours. Anemia Panel: No results for input(s): VITAMINB12, FOLATE, FERRITIN, TIBC, IRON, RETICCTPCT in the last 72 hours. Urine analysis:    Component Value Date/Time   COLORURINE ORANGE (A) 08/04/2014 1608   APPEARANCEUR CLOUDY (A) 08/04/2014 1608   LABSPEC 1.026 08/04/2014 1608   PHURINE 6.0 08/04/2014 1608   GLUCOSEU NEGATIVE 08/04/2014 1608   HGBUR LARGE (A) 08/04/2014 1608   BILIRUBINUR MODERATE (A) 08/04/2014 1608   KETONESUR NEGATIVE 08/04/2014 1608   PROTEINUR >300 (A) 08/04/2014 1608   UROBILINOGEN 4.0 (H) 08/04/2014 1608   NITRITE NEGATIVE 08/04/2014 1608   LEUKOCYTESUR TRACE (A) 08/04/2014 1608   Sepsis Labs: @LABRCNTIP (procalcitonin:4,lacticidven:4)  ) Recent Results (from the past 240 hour(s))  Surgical pcr screen     Status: None   Collection Time: 12/18/15  7:33 AM  Result Value Ref Range Status   MRSA, PCR NEGATIVE NEGATIVE Final   Staphylococcus aureus NEGATIVE NEGATIVE Final    Comment:        The Xpert SA Assay (FDA approved for NASAL specimens in patients over 71 years of age), is one component of a comprehensive surveillance program.  Test performance has been validated by [facility] for patients greater than or equal to 79 year old. It is not intended to diagnose infection nor to guide or monitor treatment.          Radiology Studies: Pelvis Portable  Result Date: 12/18/2015 CLINICAL DATA:  Post left hip hemiarthroplasty EXAM: PORTABLE PELVIS 1-2 VIEWS COMPARISON:  CT urogram 03/29/2005 FINDINGS: Postoperative left hip hemiarthroplasty using cemented femoral component. Components appear well seated. No evidence of acute fracture or dislocation. Skin clips and subcutaneous soft tissue gas consistent with recent surgery. IMPRESSION:  Postoperative left hip hemiarthroplasty. Component appears well seated. Electronically Signed   By: Lucienne Capers M.D.   On: 12/18/2015 19:14        Scheduled Meds: . sodium chloride   Intravenous Once  . atenolol  50 mg Oral Daily  . digoxin  0.0625 mg Oral Daily  . furosemide  40 mg Intravenous Once  . levothyroxine  50 mcg Oral QAC breakfast  . multivitamin with minerals  1 tablet Oral QPM  . polyethylene glycol  17 g Oral Daily  . senna-docusate  1 tablet Oral BID  . Warfarin - Pharmacist Dosing Inpatient   Does not apply q1800   Continuous Infusions:     LOS: 3 days    Time spent: 53min    Domenic Polite, MD [facility] Pager [phone]  If 7PM-7AM, please  contact night-coverage [email or web address] Password [facility] 12/20/2015, 12:14 PM

## 2015-12-20 NOTE — Progress Notes (Addendum)
Physical Therapy Treatment Patient Details Name: Amanda Richardson MRN: [id number] DOB: Jun 26, 1923 Today's Date: 12/20/2015    History of Present Illness Pt is a 82 y/o F who presented to the ED after a mechanical fall that occurred at home.  She was found to have a Lt hip femoral neck fx now s/p Lt hemiarthroplasty.  Pt's PMH includes a-fib, CHF, DVT, glaucoma, urinary incontinence, CABG.    PT Comments    Pt agreeable to session and tolerated well, though does endorse increased pain with activity.  Pt with COG posterior to BOS in standing with RW and requires max multimodal cues to correct.  Able to pivot from EOB to recliner with RW with +2 assist and increased time.   Follow Up Recommendations  SNF;Supervision for mobility/OOB     Equipment Recommendations       Recommendations for Other Services       Precautions / Restrictions Precautions Precautions: Posterior Hip;Fall Required Braces or Orthoses: Knee Immobilizer - Left Knee Immobilizer - Left: Other (comment) (KI in place on arrival, no specific orders) Restrictions Weight Bearing Restrictions: Yes LLE Weight Bearing: Weight bearing as tolerated    Mobility  Bed Mobility Overal bed mobility: Needs Assistance Bed Mobility: Supine to Sit     Supine to sit: Max assist;HOB elevated;+2 for physical assistance     General bed mobility comments: cues for technique, +2 to manage LEs and trunk, able to help initiate movement and scooting EOB once sitting  Transfers Overall transfer level: Needs assistance Equipment used: Rolling walker (2 wheeled) Transfers: Sit to/from [facility] Sit to Stand: Max assist Stand pivot transfers: Max assist;+2 physical assistance       General transfer comment: Cues for hand placement, power up, and forward weight shift over BOS in standing, mod/max verbal cues for technique for pivot with Rw  Ambulation/Gait             General Gait Details: able to take  small steps for pivot to chair, non-functional ambulation at this time   Stairs            Wheelchair Mobility    Modified Rankin (Stroke Patients Only)       Balance Overall balance assessment: Needs assistance Sitting-balance support: Bilateral upper extremity supported Sitting balance-Leahy Scale: Poor Sitting balance - Comments: requires BUE support to maintain static sitting balance EOB Postural control: Posterior lean Standing balance support: Bilateral upper extremity supported Standing balance-Leahy Scale: Zero Standing balance comment: RW and physical assist to support static stance                    Cognition Arousal/Alertness: Awake/alert Behavior During Therapy: WFL for tasks assessed/performed                        Exercises      General Comments        Pertinent Vitals/Pain Pain Assessment: 0-10 Pain Score: 9  (with movement) Pain Location: L hip, R calf 2/2 blister Pain Intervention(s): Limited activity within patient's tolerance;Monitored during session;Repositioned    Home Living                      Prior Function            PT Goals (current goals can now be found in the care plan section) Acute Rehab PT Goals Patient Stated Goal: rehab before home PT Goal Formulation: With patient/family Time For Goal Achievement:  01/02/16 Potential to Achieve Goals: Good    Frequency  Min 3X/week    PT Plan Current plan remains appropriate    Co-evaluation             End of Session Equipment Utilized During Treatment: Gait belt;Left knee immobilizer;Oxygen Activity Tolerance: Patient limited by fatigue;Patient limited by pain Patient left: in chair;with call bell/phone within reach;with family/visitor present     Time: [id number] PT Time Calculation (min) (ACUTE ONLY): 27 min  Charges:  $Therapeutic Activity: 23-37 mins                    G Codes:      Shaheem Pichon E Penven-Crew, PT, DPT 12/20/2015, 3:00  PM

## 2015-12-20 NOTE — Progress Notes (Signed)
ANTICOAGULATION CONSULT NOTE - Follow-up Consult  Pharmacy Consult for Coumadin Indication: atrial fibrillation / MVR  Allergies  Allergen Reactions  . Heparin Anaphylaxis    Patient Measurements: Height: 5\' 4"  (162.6 cm) Weight: 133 lb 9.6 oz (60.6 kg) IBW/kg (Calculated) : 54.7  Vital Signs: Temp: 97.3 F (36.3 C) (08/28 1331) Temp Source: Oral (08/28 1331) BP: 122/70 (08/28 1331) Pulse Rate: 78 (08/28 1331)  Labs:  Recent Labs  12/17/15 2041  12/18/15 1430 12/19/15 0348 12/20/15 0258  HGB 12.8  --   --  9.2* 7.4*  HCT 36.4  --   --  27.5* 22.4*  PLT 119*  --   --  111* 85*  LABPROT 29.0*  < > 19.4* 20.0* 24.0*  INR 2.67  < > 1.62 1.69 2.11  CREATININE 0.84  --   --  1.04* 1.22*  TROPONINI <0.03  --   --   --   --   < > = values in this interval not displayed.  Estimated Creatinine Clearance: 26.5 mL/min (by C-G formula based on SCr of 1.22 mg/dL).   Medical History: Past Medical History:  Diagnosis Date  . Allergic rhinitis   . Atrial fibrillation ([place])   . Basal cell carcinoma   . Bilateral lower extremity edema    history of venous insufficiency  . Bullous pemphigoid   . CHF (congestive heart failure) ([place])   . Chronic anemia    heavy periods  . Chronic anticoagulation   . Coronary artery disease   . DVT (deep venous thrombosis) ([place])    possible in 1990s and does not remember being on blood thinner  . Glaucoma   . H/O mitral valve replacement 2001   mechanical heart valve; family states indication may have been MR from MVP or  rheumatic heart disease  . Hypertension   . Hypothyroidism   . 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 S/p nephrectomy    left  . Shortness of breath dyspnea   . Thyroid disease   . Urinary incontinence     Assessment: Warfarin PTA for AFib and MVR (goal = 2.5-3.5 per prior notes) held on admit for surgery and given vitamin k 1 mg x1, warfarin restarted 8/26 PM with no bridge given heparin allergy.   -INR trend up to 2.11  **PTA dosing = 2.5 mg MWF, 5 mg AOD  Goal of Therapy:  INR 2.5-3.5 Monitor platelets by anticoagulation protocol: Yes   Plan:  -Warfarin 2.5 mg x1 -Daily INR  Hildred Laser, Pharm D 12/20/2015 1:45 PM

## 2015-12-20 NOTE — Clinical Social Work Note (Signed)
Clinical Social Work Assessment  Patient Details  Name: Amanda Richardson MRN: [id number] Date of Birth: 1923/09/17  Date of referral:  12/20/15               Reason for consult:  Facility Placement                Permission sought to share information with:   (Facilities) Permission granted to share information::   (Facilities)  Name::        Agency::     Relationship::     Contact Information:     Housing/Transportation Living arrangements for the past 2 months:  Single Family Home (Patient states she lives alone in [place].) Source of Information:  Patient, Adult Children Patient Interpreter Needed:  None Criminal Activity/Legal Involvement Pertinent to Current Situation/Hospitalization:  No - Comment as needed Significant Relationships:  Adult Children Lives with:  Self Do you feel safe going back to the place where you live?   (Patient and daughter are interested in facility.) Need for family participation in patient care:  Yes (Comment)  Care giving concerns:  Patient states that she was independent prior to falling. Patient states that she now needs assistance with ADL's.   Social Worker assessment / plan:  SW met with patient at bedside. She was alert and oriented. Daughter was present. Patient states that she presents to hospital due to having a fall last Friday. Patient states that she is not sure what caused her to fall. Patient states that all of a sudden she found herself on the ground.  Patient states that she would be interested in facility for rehab and is accepting to being referred out to facilities.   Employment status:  Research scientist (medical), Other (Comment) Insurance information:   Therapist, occupational.) PT Recommendations:  [place] / Referral to community resources:  [place]  Patient/Family's Response to care:  Patient and daughter are accepting at this time.  Patient/Family's Understanding of and Emotional Response to  Diagnosis, Current Treatment, and Prognosis:  Patient or daughter has no questions for SW at this time.  Emotional Assessment Appearance:  Appears stated age Attitude/Demeanor/Rapport:   (Appropriate.) Affect (typically observed):  Accepting, Appropriate Orientation:  Oriented to Self, Oriented to Place, Oriented to  Time, Oriented to Situation Alcohol / Substance use:  Not Applicable Psych involvement (Current and /or in the community):  No (Comment)  Discharge Needs  Concerns to be addressed:  No discharge needs identified Readmission within the last 30 days:  No Current discharge risk:  None Barriers to Discharge:  No Barriers Identified   [name] R 12/20/2015, 2:55 PM

## 2015-12-20 NOTE — Progress Notes (Signed)
   PATIENT ID: Amanda Richardson   2 Days Post-Op Procedure(s) (LRB): HEMIARTHROPLASTY  HIP (Left)  Subjective: Doing well. Pain minimal. She hasnt had pain medication in 12 hours and tolerating that well. Feels "stronger" than yesterday. Reports skin abrasions on legs are painful. No other concerns.   Objective:  Vitals:   12/19/15 2024 12/20/15 0428  BP: (!) 94/51 (!) 110/55  Pulse: 85 87  Resp:    Temp: 98.2 F (36.8 C) 98.6 F (37 C)     L LE with chronic swelling No calf tenderness Incision dressing with some saturation, intact Skin tears bilateral thighs Wiggles toes, distally NVI  Labs:   Recent Labs  12/17/15 2041 12/19/15 0348 12/20/15 0258  HGB 12.8 9.2* 7.4*   Recent Labs  12/19/15 0348 12/20/15 0258  WBC 11.1* 10.9*  RBC 3.00* 2.42*  HCT 27.5* 22.4*  PLT 111* 85*   Recent Labs  12/19/15 0348 12/20/15 0258  NA 140 137  K 3.9 3.8  CL 108 105  CO2 26 25  BUN 17 26*  CREATININE 1.04* 1.22*  GLUCOSE 131* 129*  CALCIUM 8.2* 7.9*    Assessment and Plan: 2 days s/p left hip hemiarthroplasty for fracture ABLA- expected, will likely need a transfusion, will defer to medicine to determine if this is safe Continue to minimize narcotics, norco or tylenol prn Up with PT today Coumadin per pharmacy Posterior hip precautions- knee immobilizer and foam pillow between legs at all times except for PT Please apply dressings to wounds WBAT

## 2015-12-21 ENCOUNTER — Encounter (HOSPITAL_COMMUNITY): Payer: Self-pay

## 2015-12-21 DIAGNOSIS — I482 Chronic atrial fibrillation: Secondary | ICD-10-CM

## 2015-12-21 LAB — TYPE AND SCREEN
ABO/RH(D): AB POS
Antibody Screen: NEGATIVE
UNIT DIVISION: 0
UNIT DIVISION: 0

## 2015-12-21 LAB — BASIC METABOLIC PANEL
Anion gap: 5 (ref 5–15)
BUN: 26 mg/dL — AB (ref 6–20)
CHLORIDE: 106 mmol/L (ref 101–111)
CO2: 27 mmol/L (ref 22–32)
CREATININE: 0.96 mg/dL (ref 0.44–1.00)
Calcium: 7.9 mg/dL — ABNORMAL LOW (ref 8.9–10.3)
GFR calc Af Amer: 59 mL/min — ABNORMAL LOW (ref >60)
GFR calc non Af Amer: 51 mL/min — ABNORMAL LOW (ref >60)
GLUCOSE: 105 mg/dL — AB (ref 65–99)
Potassium: 3.9 mmol/L (ref 3.5–5.1)
SODIUM: 138 mmol/L (ref 135–145)

## 2015-12-21 LAB — CBC
HCT: 26.9 % — ABNORMAL LOW (ref 36.0–46.0)
Hemoglobin: 9.1 g/dL — ABNORMAL LOW (ref 12.0–15.0)
MCH: 30.2 pg (ref 26.0–34.0)
MCHC: 33.8 g/dL (ref 30.0–36.0)
MCV: 89.4 fL (ref 78.0–100.0)
PLATELETS: 102 10*3/uL — AB (ref 150–400)
RBC: 3.01 MIL/uL — ABNORMAL LOW (ref 3.87–5.11)
RDW: 15.5 % (ref 11.5–15.5)
WBC: 9.5 10*3/uL (ref 4.0–10.5)

## 2015-12-21 LAB — PROTIME-INR
INR: 2.35
Prothrombin Time: 26.1 seconds — ABNORMAL HIGH (ref 11.4–15.2)

## 2015-12-21 MED ORDER — WARFARIN SODIUM 5 MG PO TABS
5.0000 mg | ORAL_TABLET | Freq: Once | ORAL | Status: AC
  Administered 2015-12-21: 5 mg via ORAL
  Filled 2015-12-21: qty 1

## 2015-12-21 MED ORDER — SENNOSIDES-DOCUSATE SODIUM 8.6-50 MG PO TABS: 1.0000 | ORAL_TABLET | Freq: Two times a day (BID) | ORAL | Status: DC

## 2015-12-21 MED ORDER — FUROSEMIDE 40 MG PO TABS
40.0000 mg | ORAL_TABLET | Freq: Every day | ORAL | Status: DC
  Administered 2015-12-21 – 2015-12-22 (×2): 40 mg via ORAL
  Filled 2015-12-21 (×2): qty 1

## 2015-12-21 MED ORDER — ACETAMINOPHEN 325 MG PO TABS: 650.0000 mg | ORAL_TABLET | Freq: Four times a day (QID) | ORAL | Status: DC | PRN

## 2015-12-21 MED ORDER — ENSURE ENLIVE PO LIQD
237.0000 mL | Freq: Two times a day (BID) | ORAL | Status: DC
  Administered 2015-12-21 – 2015-12-22 (×3): 237 mL via ORAL

## 2015-12-21 NOTE — Evaluation (Signed)
Occupational Therapy Evaluation Patient Details Name: Amanda Richardson MRN: [id number] DOB: 1924/04/08 Today's Date: 12/21/2015    History of Present Illness Pt is a 82 y/o F who presented to the ED after a mechanical fall that occurred at home.  She was found to have a Lt hip femoral neck fx now s/p Lt hemiarthroplasty.  Pt's PMH includes a-fib, CHF, DVT, glaucoma, urinary incontinence, CABG.   Clinical Impression   Pt is willing and able to work with therapy. PTA Pt lived alone and was independent in ADL. IADL she mainly ate microwave meals, and had her daughters drive her but still cleaning. Pt stated several times during the session "Thank you for being so encouraging" Pt used Steady and was able to perform sink level ADL with mod assist (please see problem list below). Pt will benefit from skilled occupational therapy in the acute care setting and in next venue of care to increase independence in ADL.    Follow Up Recommendations  SNF;Supervision/Assistance - 24 hour    Equipment Recommendations  Other (comment) (To be determined by next care venue)    Recommendations for Other Services       Precautions / Restrictions Precautions Precautions: Posterior Hip;Fall Precaution Booklet Issued: Yes (comment) Precaution Comments: Pt remains to require re-education for hip precautions, daughter educated on hip precautions.   Required Braces or Orthoses: Knee Immobilizer - Left Knee Immobilizer - Left:  (Other (comment) (KI on upon arrival, no specific orders)) Restrictions Weight Bearing Restrictions: Yes LLE Weight Bearing: Weight bearing as tolerated      Mobility Bed Mobility               General bed mobility comments: Pt received in recliner on arrival.    Transfers Overall transfer level: Needs assistance Equipment used: None Transfers: Sit to/from Stand Sit to Stand: Mod assist;+2 physical assistance;+2 safety/equipment;Max assist         General transfer  comment: Cues for hand placement to pull on stedy bar to improve ease and build confidence.  Pt required cues and tactile assist for forward weight shifting, upper trunk and head control.  Posterior lean noted in standing.      Balance Overall balance assessment: Needs assistance Sitting-balance support: Bilateral upper extremity supported Sitting balance-Leahy Scale: Poor       Standing balance-Leahy Scale: Zero Standing balance comment: Pt required physical assistance and UE support to maintain standing. Static stance x5 min during ADL's at sink.                             ADL Overall ADL's : Needs assistance/impaired Eating/Feeding: Minimal assistance;Sitting Eating/Feeding Details (indicate cue type and reason): tremor, which can make eating difficult, but Pt refused assistance and was successful Grooming: Wash/dry hands;Wash/dry face;Oral care;Brushing hair;Sitting;Standing;Moderate assistance (using STEADY) Grooming Details (indicate cue type and reason): using STEDY at sink to perform ADL with verbal cues for sit to stand and req mod assit to remain standing     Lower Body Bathing: Maximal assistance;Sit to/from stand       Lower Body Dressing: Maximal assistance;Sit to/from stand (don depends) Lower Body Dressing Details (indicate cue type and reason): unable to reach feet to adjust socks Toilet Transfer: Comfort height toilet;Moderate assistance;+2 for physical assistance;+2 for safety/equipment (STEDY)           Functional mobility during ADLs: Moderate assistance;+2 for physical assistance;+2 for safety/equipment Charlaine Dalton) General ADL Comments: Pt really enjoyed and  benefitted from using Stedy for sink level ADL "Thank you for being so encouraging"     Vision     Perception     Praxis      Pertinent Vitals/Pain Pain Assessment: 0-10 Pain Score: 3  Faces Pain Scale: Hurts little more Pain Location: L hip Pain Descriptors / Indicators:  Aching;Grimacing;Operative site guarding Pain Intervention(s): Limited activity within patient's tolerance;Repositioned;Monitored during session     Hand Dominance     Extremity/Trunk Assessment Upper Extremity Assessment Upper Extremity Assessment: Generalized weakness   Lower Extremity Assessment Lower Extremity Assessment: LLE deficits/detail LLE Deficits / Details: limited ROM and strength as expected s/p surgery listed above   Cervical / Trunk Assessment Cervical / Trunk Assessment: Kyphotic   Communication Communication Communication: HOH   Cognition Arousal/Alertness: Awake/alert Behavior During Therapy: WFL for tasks assessed/performed Overall Cognitive Status: Within Functional Limits for tasks assessed                     General Comments       Exercises       Shoulder Instructions      Home Living Family/patient expects to be discharged to:: Skilled nursing facility Living Arrangements: Alone Available Help at Discharge: Family;Available PRN/intermittently Type of Home: House Home Access: Stairs to enter [employer] of Steps: 2 Entrance Stairs-Rails: Right Home Layout: One level               Home Equipment: Walker - 2 wheels;Cane - single point          Prior Functioning/Environment Level of Independence: Independent        Comments: Pt takes a sponge bath.  Does not cook but rather does microwave meals.  Daughters do the driving.  Otherwise independent living alone.    OT Diagnosis: Generalized weakness;Acute pain   OT Problem List: Decreased strength;Decreased range of motion;Decreased activity tolerance;Decreased knowledge of use of DME or AE;Decreased knowledge of precautions;Pain   OT Treatment/Interventions: Self-care/ADL training;DME and/or AE instruction;Therapeutic activities;Patient/family education;Balance training    OT Goals(Current goals can be found in the care plan section) Acute Rehab OT Goals Patient  Stated Goal: rehab before home OT Goal Formulation: With patient/family Time For Goal Achievement: 01/04/16 Potential to Achieve Goals: Good ADL Goals Pt Will Perform Grooming: with supervision;sitting Pt Will Perform Upper Body Dressing: with supervision;sitting Pt Will Perform Lower Body Dressing: with mod assist;sit to/from stand Pt Will Transfer to Toilet: with mod assist;bedside commode;ambulating Pt Will Perform Toileting - Clothing Manipulation and hygiene: with min guard assist;sit to/from stand Additional ADL Goal #1: Pt will be able to recall and maintain 3 out of 3 precautions during functional activity  OT Frequency: Min 2X/week   Barriers to D/C: Decreased caregiver support  very supportive family, but Pt lives alone       Co-evaluation PT/OT/SLP Co-Evaluation/Treatment: Yes Reason for Co-Treatment: For patient/therapist safety PT goals addressed during session: Mobility/safety with mobility OT goals addressed during session: ADL's and self-care      End of Session Nurse Communication: Mobility status  Activity Tolerance: Patient tolerated treatment well;Patient limited by fatigue Patient left: in chair;with call bell/phone within reach   Time: [id number] OT Time Calculation (min): 26 min Charges:  OT General Charges $OT Visit: 1 Procedure OT Evaluation $OT Eval Moderate Complexity: 1 Procedure G-Codes:    Merri Ray Latajah Thuman 01-11-2016, 3:07 PM  Hulda Humphrey OTR/L [phone]

## 2015-12-21 NOTE — Progress Notes (Signed)
   PATIENT ID: Hans Eden Bobst   3 Days Post-Op Procedure(s) (LRB): HEMIARTHROPLASTY  HIP (Left)  Subjective: Doing well, eating earlier today and reports minimal pain.   Objective:  Vitals:   12/20/15 2050 12/21/15 0553  BP: (!) 117/49 (!) 124/55  Pulse: 87 77  Resp: 16 16  Temp: 100.3 F (37.9 C) 98.9 F (37.2 C)     L hip dressing changed by nursing c/d/i Wiggles toes, distally NVI   Labs:   Recent Labs  12/19/15 0348 12/20/15 0258 12/21/15 0517  HGB 9.2* 7.4* 9.1*   Recent Labs  12/20/15 0258 12/21/15 0517  WBC 10.9* 9.5  RBC 2.42* 3.01*  HCT 22.4* 26.9*  PLT 85* 102*   Recent Labs  12/20/15 0258 12/21/15 0517  NA 137 138  K 3.8 3.9  CL 105 106  CO2 25 27  BUN 26* 26*  CREATININE 1.22* 0.96  GLUCOSE 129* 105*  CALCIUM 7.9* 7.9*    Assessment and Plan: 3 days s/p left hip hemiarthroplasty for fracture ABLA- expected, improved from transfusion yesterday Continue to minimize narcotics, norco or tylenol prn Up with PT today Coumadin per pharmacy Posterior hip precautions- knee immobilizer and foam pillow between legs at all times except for PT WBAT D/c to SNF per primary team, fu with Dr. Tamera Punt in 2 weeks

## 2015-12-21 NOTE — Progress Notes (Addendum)
Nutrition Follow Up  DOCUMENTATION CODES:   Not applicable  INTERVENTION:    Ensure Enlive po BID, each supplement provides 350 kcal and 20 grams of protein  NUTRITION DIAGNOSIS:   Increased nutrient needs related to acute illness as evidenced by estimated needs, ongoing  GOAL:   Patient will meet greater than or equal to 90% of their needs  MONITOR:   PO intake, Labs, Weight trends, I & O's  ASSESSMENT:   82 year old female who presented to the ED after a mechanical fall that occurred at home on 8/25. Admitted with L hip femoral neck fracture.  Patient s/p procedure 8/26: LEFT HEMIARTHROPLASTY   Diet advanced to Heart Healthy 8/27. PO intake variable at 25-50% per flowsheet records. Would benefit from oral nutrition supplements given post-op state >> will order. Labs and medications reviewed. No muscle or subcutaneous fat depletion noticed.  Diet Order:  Diet Heart Room service appropriate? Yes; Fluid consistency: Thin  Skin:  Reviewed, no issues  Last BM:  8/25  Height:   Ht Readings from Last 1 Encounters:  12/18/15 5\' 4"  (1.626 m)    Weight:   Wt Readings from Last 1 Encounters:  12/18/15 133 lb 9.6 oz (60.6 kg)    Ideal Body Weight:  54.5 kg  BMI:  Body mass index is 22.93 kg/m.  Estimated Nutritional Needs:   Kcal:  1550-1750  Protein:  75-90 gm  Fluid:  1.6-1.8 L  EDUCATION NEEDS:   No education needs identified at this time  Arthur Holms, RD, LDN Pager #: [phone] After-Hours Pager #: [phone]

## 2015-12-21 NOTE — Progress Notes (Signed)
Physical Therapy Treatment Patient Details Name: Amanda Richardson MRN: [id number] DOB: [birth year] Today's Date: 12/21/2015    History of Present Illness Pt is a 82 y/o F who presented to the ED after a mechanical fall that occurred at home.  She was found to have a Lt hip femoral neck fx now s/p Lt hemiarthroplasty.  Pt's PMH includes a-fib, CHF, DVT, glaucoma, urinary incontinence, CABG.    PT Comments    Pt performed sit to stand at stedy frame with cues for upper trunk control.  Pt presents with improved standing.  Pt fatigues easily but motivated to improve.    Follow Up Recommendations  SNF;Supervision for mobility/OOB     Equipment Recommendations  Other (comment)    Recommendations for Other Services OT consult     Precautions / Restrictions Precautions Precautions: Posterior Hip;Fall Precaution Booklet Issued: Yes (comment) Precaution Comments: Pt remains to require re-education for hip precautions, daughter educated on hip precautions.   Restrictions Weight Bearing Restrictions: Yes LLE Weight Bearing: Weight bearing as tolerated    Mobility  Bed Mobility               General bed mobility comments: Pt received in recliner on arrival.    Transfers Overall transfer level: Needs assistance Equipment used: None Transfers: Sit to/from Stand Sit to Stand: Mod assist;+2 physical assistance;+2 safety/equipment;Max assist (Max +2 from recliner and mod assist from stedy pad (higher seat height))         General transfer comment: Cues for hand placement to pull on stedy bar to improve ease and build confidence.  Pt required cues and tactile assist for forward weight shifting, upper trunk and head control.  Posterior lean noted in standing.    Ambulation/Gait                 Stairs            Wheelchair Mobility    Modified Rankin (Stroke Patients Only)       Balance Overall balance assessment: Needs assistance Sitting-balance support:  Bilateral upper extremity supported Sitting balance-Leahy Scale: Poor       Standing balance-Leahy Scale: Zero Standing balance comment: Pt required physical assistance and UE support to maintain standing.  Static stance x5 min during ADL's at sink.                      Cognition Arousal/Alertness: Awake/alert Behavior During Therapy: WFL for tasks assessed/performed Overall Cognitive Status: Within Functional Limits for tasks assessed                      Exercises      General Comments        Pertinent Vitals/Pain Pain Assessment: Faces Faces Pain Scale: Hurts little more Pain Location: L hip  Pain Descriptors / Indicators: Grimacing;Guarding;Aching Pain Intervention(s): Monitored during session;Repositioned    Home Living                      Prior Function            PT Goals (current goals can now be found in the care plan section) Acute Rehab PT Goals Patient Stated Goal: rehab before home Potential to Achieve Goals: Good Progress towards PT goals: Progressing toward goals    Frequency  Min 3X/week    PT Plan Current plan remains appropriate    Co-evaluation PT/OT/SLP Co-Evaluation/Treatment: Yes Reason for Co-Treatment: For patient/therapist safety PT goals addressed  during session: Mobility/safety with mobility OT goals addressed during session: ADL's and self-care     End of Session Equipment Utilized During Treatment: Gait belt Activity Tolerance: Patient limited by fatigue;Patient limited by pain Patient left: in chair;with call bell/phone within reach;with family/visitor present     Time: 1041-1105 PT Time Calculation (min) (ACUTE ONLY): 24 min  Charges:  $Therapeutic Activity: 8-22 mins                    G Codes:      Cristela Blue 01-19-16, 11:16 AM Governor Rooks, PTA pager [phone]

## 2015-12-21 NOTE — Discharge Summary (Signed)
Physician Discharge Summary  Amanda Richardson [id number] DOB: Sep 07, 1923 DOA: 12/17/2015  PCP: Merrilee Seashore, MD  Admit date: 12/17/2015 Discharge date: 12/22/2015  Time spent: 45 minutes  Recommendations for Outpatient Follow-up:  1. PCP in 1 week, please monitor INR, on coumadin for Mechanical MVR 2. Dr.Chandler with Ortho in 2 weeks   Discharge Diagnoses:  Principal Problem:   Left displaced femoral neck fracture (HCC) Active Problems:   H/O mechanical mitral valve replacement 2001   Chronic atrial fibrillation (HCC)   Chronic anticoagulation   Chronic diastolic CHF  Discharge Condition: stable  Diet recommendation: heart healthy  Filed Weights   12/18/15 0425  Weight: 60.6 kg (133 lb 9.6 oz)    History of present illness:  : Amanda Richardson a 82 y.o.femalewith medical history significant of mechanical mitral valve replacement, on coumadin. Heparin allergy per family "almost killed her", per allergies list "anaphalaxis", A.Fib. Patient presented to the ED after a mechanical fall that occurred at home. She was standing in kitchen and leg gave way. Found to haveL hip femoral neck fx. INR 2.67  Hospital Course:  1. L hip fracture -s/p mechanical fall -coumadin held and underwent L Hemiarthroplasty yesterday, coumadin resumed yesterday post op -INR 2.35 at discharge today  2. Mechanical MVR -coumadin resumed, after being held pre -op, unable to tolerate heparin or lovenox due to severe heparin allergy as a bridge peri-op -INR 2.35 at discharge  3. Post op blood loss anemia -no overt bleeding and no bleeding at surgical site -Hb down to 7.4 post op and was transfused 2units PRBC  -Hb improved at 9  4. Chronic diastolic CHF -euvolemic, PO lasix resumed  5. HTN -lisinopril resumed and continued atenolol  Consultations:  Ortho Dr.Chandler  Discharge Exam: Vitals:   12/20/15 2050 12/21/15 0553  BP: (!) 117/49 (!) 124/55  Pulse: 87 77   Resp: 16 16  Temp: 100.3 F (37.9 C) 98.9 F (37.2 C)    General: AAOx3 Cardiovascular: S1S2/RRR Respiratory: CTAB  Discharge Instructions   Discharge Instructions    Diet - low sodium heart healthy    Complete by:  As directed   Increase activity slowly    Complete by:  As directed   Weight bearing as tolerated    Complete by:  As directed     Current Discharge Medication List    START taking these medications   Details  acetaminophen (TYLENOL) 325 MG tablet Take 2 tablets (650 mg total) by mouth every 6 (six) hours as needed for mild pain (or Fever >/= 101).    HYDROcodone-acetaminophen (NORCO/VICODIN) 5-325 MG tablet Take 1-2 tablets by mouth every 6 (six) hours as needed for moderate pain. Qty: 30 tablet, Refills: 0    senna-docusate (SENOKOT-S) 8.6-50 MG tablet Take 1 tablet by mouth 2 (two) times daily.      CONTINUE these medications which have NOT CHANGED   Details  atenolol (TENORMIN) 25 MG tablet Take 50 mg by mouth daily.    calcium-vitamin D (OSCAL WITH D) 500-200 MG-UNIT per tablet Take 1 tablet by mouth every evening.     Cholecalciferol (VITAMIN D-3) 1000 UNITS CAPS Take 2 capsules by mouth every evening.     COUMADIN 5 MG tablet Take 2.5-5 mg by mouth as directed. Mon, Wed, Fri: Take 2.5mg  Sun, Tues, Thurs, Sat= 5mg  Refills: 8    digoxin (LANOXIN) 0.125 MG tablet Take 0.0625 mg by mouth daily.     furosemide (LASIX) 40 MG tablet Take 1 tablet (40 mg  total) by mouth daily.    KLOR-CON M20 20 MEQ tablet Take 1 tablet by mouth daily. Refills: 0    latanoprost (XALATAN) 0.005 % ophthalmic solution Place 1 drop into both eyes at bedtime.    levothyroxine (SYNTHROID, LEVOTHROID) 50 MCG tablet Take 50 mcg by mouth daily. Refills: 6    lisinopril (PRINIVIL,ZESTRIL) 10 MG tablet Take 10 mg by mouth daily.  Refills: 3    Multiple Vitamin (MULTIVITAMIN WITH MINERALS) TABS Take 1 tablet by mouth every evening.     clobetasol ointment (TEMOVATE) [id number] %  Apply 1 application topically as needed (blisters for bulous pemphigoid).        Allergies  Allergen Reactions  . Heparin Anaphylaxis   Follow-up Information    [name], MD. Schedule an appointment as soon as possible for a visit in 1 week(s).   Specialty:  Orthopedic Surgery Contact information: [address] [place] [address] [phone]            The results of significant diagnostics from this hospitalization (including imaging, microbiology, ancillary and laboratory) are listed below for reference.    Significant Diagnostic Studies: Dg Chest 1 View  Result Date: 12/17/2015 CLINICAL DATA:  Status post fall.  History of AFib and CHF. EXAM: CHEST 1 VIEW COMPARISON:  Chest radiograph 08/10/2014 and before FINDINGS: The heart remains massively enlarged. There is right retrocardiac consolidation. Previously seen pleural effusion on the right has resolved. No pneumothorax. IMPRESSION: 1. Massive cardiomegaly, unchanged. 2. Probable superimposed right lower lobe consolidation. This could indicate developing infection or atelectasis. Electronically Signed   By: Ulyses Jarred M.D.   On: 12/17/2015 20:52   Pelvis Portable  Result Date: 12/18/2015 CLINICAL DATA:  Post left hip hemiarthroplasty EXAM: PORTABLE PELVIS 1-2 VIEWS COMPARISON:  CT urogram 03/29/2005 FINDINGS: Postoperative left hip hemiarthroplasty using cemented femoral component. Components appear well seated. No evidence of acute fracture or dislocation. Skin clips and subcutaneous soft tissue gas consistent with recent surgery. IMPRESSION: Postoperative left hip hemiarthroplasty. Component appears well seated. Electronically Signed   By: Lucienne Capers M.D.   On: 12/18/2015 19:14   Dg Hip Unilat W Or Wo Pelvis 2-3 Views Left  Result Date: 12/17/2015 CLINICAL DATA:  Fall with hip pain.  LEFT hip pain EXAM: DG HIP (WITH OR WITHOUT PELVIS) 2-3V LEFT COMPARISON:  None. FINDINGS: Subcapital  fracture of the LEFT femoral neck with varus angulation. There is mild migration of the femur superiorly. IMPRESSION: Subcapital LEFT femoral neck fracture Electronically Signed   By: Suzy Bouchard M.D.   On: 12/17/2015 20:50    Microbiology: Recent Results (from the past 240 hour(s))  Surgical pcr screen     Status: None   Collection Time: 12/18/15  7:33 AM  Result Value Ref Range Status   MRSA, PCR NEGATIVE NEGATIVE Final   Staphylococcus aureus NEGATIVE NEGATIVE Final    Comment:        The Xpert SA Assay (FDA approved for NASAL specimens in patients over 25 years of age), is one component of a comprehensive surveillance program.  Test performance has been validated by [facility] for patients greater than or equal to 64 year old. It is not intended to diagnose infection nor to guide or monitor treatment.      Labs: Basic Metabolic Panel:  Recent Labs Lab 12/17/15 2041 12/19/15 0348 12/20/15 0258 12/21/15 0517  NA 141 140 137 138  K 3.9 3.9 3.8 3.9  CL 107 108 105 106  CO2 26  26 25 27   GLUCOSE 146* 131* 129* 105*  BUN 18 17 26* 26*  CREATININE 0.84 1.04* 1.22* 0.96  CALCIUM 9.1 8.2* 7.9* 7.9*   Liver Function Tests:  Recent Labs Lab 12/17/15 2041  AST 45*  ALT 28  ALKPHOS 64  BILITOT 1.5*  PROT 7.0  ALBUMIN 3.8   No results for input(s): LIPASE, AMYLASE in the last 168 hours. No results for input(s): AMMONIA in the last 168 hours. CBC:  Recent Labs Lab 12/17/15 2041 12/19/15 0348 12/20/15 0258 12/21/15 0517  WBC 7.8 11.1* 10.9* 9.5  NEUTROABS 6.6  --   --   --   HGB 12.8 9.2* 7.4* 9.1*  HCT 36.4 27.5* 22.4* 26.9*  MCV 89.2 91.7 92.6 89.4  PLT 119* 111* 85* 102*   Cardiac Enzymes:  Recent Labs Lab 12/17/15 2041  TROPONINI <0.03   BNP: BNP (last 3 results) No results for input(s): BNP in the last 8760 hours.  ProBNP (last 3 results) No results for input(s): PROBNP in the last 8760 hours.  CBG:  Recent Labs Lab 12/18/15 0431   GLUCAP 131*       SignedDomenic Polite MD.  [facility] 12/21/2015, 4:05 PM

## 2015-12-21 NOTE — H&P (Deleted)
Physician Discharge Summary  Amanda Richardson [id number] DOB: Sep 19, 1923 DOA: 12/17/2015  PCP: Merrilee Seashore, MD  Admit date: 12/17/2015 Discharge date: 12/21/2015  Time spent: 45 minutes  Recommendations for Outpatient Follow-up:  1. PCP in 1 week   Discharge Diagnoses:  Principal Problem:   Left displaced femoral neck fracture (HCC) Active Problems:   H/O mechanical mitral valve replacement 2001   Chronic atrial fibrillation (HCC)   Chronic anticoagulation   Chronic diastolic CHF  Discharge Condition: stable  Diet recommendation: heart healthy  Filed Weights   12/18/15 0425  Weight: 60.6 kg (133 lb 9.6 oz)    History of present illness:  : Amanda Richardson a 81 y.o.femalewith medical history significant of mechanical mitral valve replacement, on coumadin. Heparin allergy per family "almost killed her", per allergies list "anaphalaxis", A.Fib. Patient presented to the ED after a mechanical fall that occurred at home. She was standing in kitchen and leg gave way. Found to haveL hip femoral neck fx. INR 2.67  Hospital Course:  1. L hip fracture -s/p mechanical fall -coumadin held and underwent L Hemiarthroplasty yesterday, coumadin resumed yesterday post op -INR 2.35 at discharge today  2. Mechanical MVR -coumadin resumed, after being held pre -op, unable to tolerate heparin or lovenox due to severe heparin allergy as a bridge peri-op -INR 2.35 at discharge  3. Post op blood loss anemia -no overt bleeding and no bleeding at surgical site -Hb down to 7.4 post op and was transfused 2units PRBC  -Hb improved at 9  4. Chronic diastolic CHF -euvolemic, PO lasix resumed  5. HTN -lisinopril resumed and continued atenolol  Consultations:  Ortho Dr.Chandler  Discharge Exam: Vitals:   12/20/15 2050 12/21/15 0553  BP: (!) 117/49 (!) 124/55  Pulse: 87 77  Resp: 16 16  Temp: 100.3 F (37.9 C) 98.9 F (37.2 C)    General:  AAOx3 Cardiovascular: S1S2/RRR Respiratory: CTAB  Discharge Instructions   Discharge Instructions    Diet - low sodium heart healthy    Complete by:  As directed   Increase activity slowly    Complete by:  As directed   Weight bearing as tolerated    Complete by:  As directed     Current Discharge Medication List    START taking these medications   Details  acetaminophen (TYLENOL) 325 MG tablet Take 2 tablets (650 mg total) by mouth every 6 (six) hours as needed for mild pain (or Fever >/= 101).    HYDROcodone-acetaminophen (NORCO/VICODIN) 5-325 MG tablet Take 1-2 tablets by mouth every 6 (six) hours as needed for moderate pain. Qty: 30 tablet, Refills: 0    senna-docusate (SENOKOT-S) 8.6-50 MG tablet Take 1 tablet by mouth 2 (two) times daily.      CONTINUE these medications which have NOT CHANGED   Details  atenolol (TENORMIN) 25 MG tablet Take 50 mg by mouth daily.    calcium-vitamin D (OSCAL WITH D) 500-200 MG-UNIT per tablet Take 1 tablet by mouth every evening.     Cholecalciferol (VITAMIN D-3) 1000 UNITS CAPS Take 2 capsules by mouth every evening.     COUMADIN 5 MG tablet Take 2.5-5 mg by mouth as directed. Mon, Wed, Fri: Take 2.5mg  Sun, Tues, Thurs, Sat= 5mg  Refills: 8    digoxin (LANOXIN) 0.125 MG tablet Take 0.0625 mg by mouth daily.     furosemide (LASIX) 40 MG tablet Take 1 tablet (40 mg total) by mouth daily.    KLOR-CON M20 20 MEQ tablet Take 1 tablet  by mouth daily. Refills: 0    latanoprost (XALATAN) 0.005 % ophthalmic solution Place 1 drop into both eyes at bedtime.    levothyroxine (SYNTHROID, LEVOTHROID) 50 MCG tablet Take 50 mcg by mouth daily. Refills: 6    lisinopril (PRINIVIL,ZESTRIL) 10 MG tablet Take 10 mg by mouth daily.  Refills: 3    Multiple Vitamin (MULTIVITAMIN WITH MINERALS) TABS Take 1 tablet by mouth every evening.     clobetasol ointment (TEMOVATE) [id number] % Apply 1 application topically as needed (blisters for bulous pemphigoid).         Allergies  Allergen Reactions  . Heparin Anaphylaxis   Follow-up Information    [name], MD. Schedule an appointment as soon as possible for a visit in 1 week(s).   Specialty:  Orthopedic Surgery Contact information: [address] [place] [address] [phone]            The results of significant diagnostics from this hospitalization (including imaging, microbiology, ancillary and laboratory) are listed below for reference.    Significant Diagnostic Studies: Dg Chest 1 View  Result Date: 12/17/2015 CLINICAL DATA:  Status post fall.  History of AFib and CHF. EXAM: CHEST 1 VIEW COMPARISON:  Chest radiograph 08/10/2014 and before FINDINGS: The heart remains massively enlarged. There is right retrocardiac consolidation. Previously seen pleural effusion on the right has resolved. No pneumothorax. IMPRESSION: 1. Massive cardiomegaly, unchanged. 2. Probable superimposed right lower lobe consolidation. This could indicate developing infection or atelectasis. Electronically Signed   By: Ulyses Jarred M.D.   On: 12/17/2015 20:52   Pelvis Portable  Result Date: 12/18/2015 CLINICAL DATA:  Post left hip hemiarthroplasty EXAM: PORTABLE PELVIS 1-2 VIEWS COMPARISON:  CT urogram 03/29/2005 FINDINGS: Postoperative left hip hemiarthroplasty using cemented femoral component. Components appear well seated. No evidence of acute fracture or dislocation. Skin clips and subcutaneous soft tissue gas consistent with recent surgery. IMPRESSION: Postoperative left hip hemiarthroplasty. Component appears well seated. Electronically Signed   By: Lucienne Capers M.D.   On: 12/18/2015 19:14   Dg Hip Unilat W Or Wo Pelvis 2-3 Views Left  Result Date: 12/17/2015 CLINICAL DATA:  Fall with hip pain.  LEFT hip pain EXAM: DG HIP (WITH OR WITHOUT PELVIS) 2-3V LEFT COMPARISON:  None. FINDINGS: Subcapital fracture of the LEFT femoral neck with varus angulation. There is mild  migration of the femur superiorly. IMPRESSION: Subcapital LEFT femoral neck fracture Electronically Signed   By: Suzy Bouchard M.D.   On: 12/17/2015 20:50    Microbiology: Recent Results (from the past 240 hour(s))  Surgical pcr screen     Status: None   Collection Time: 12/18/15  7:33 AM  Result Value Ref Range Status   MRSA, PCR NEGATIVE NEGATIVE Final   Staphylococcus aureus NEGATIVE NEGATIVE Final    Comment:        The Xpert SA Assay (FDA approved for NASAL specimens in patients over 39 years of age), is one component of a comprehensive surveillance program.  Test performance has been validated by [facility] for patients greater than or equal to 11 year old. It is not intended to diagnose infection nor to guide or monitor treatment.      Labs: Basic Metabolic Panel:  Recent Labs Lab 12/17/15 2041 12/19/15 0348 12/20/15 0258 12/21/15 0517  NA 141 140 137 138  K 3.9 3.9 3.8 3.9  CL 107 108 105 106  CO2 26 26 25 27   GLUCOSE 146* 131* 129* 105*  BUN 18 17 26* 26*  CREATININE 0.84 1.04* 1.22* 0.96  CALCIUM 9.1 8.2* 7.9* 7.9*   Liver Function Tests:  Recent Labs Lab 12/17/15 2041  AST 45*  ALT 28  ALKPHOS 64  BILITOT 1.5*  PROT 7.0  ALBUMIN 3.8   No results for input(s): LIPASE, AMYLASE in the last 168 hours. No results for input(s): AMMONIA in the last 168 hours. CBC:  Recent Labs Lab 12/17/15 2041 12/19/15 0348 12/20/15 0258 12/21/15 0517  WBC 7.8 11.1* 10.9* 9.5  NEUTROABS 6.6  --   --   --   HGB 12.8 9.2* 7.4* 9.1*  HCT 36.4 27.5* 22.4* 26.9*  MCV 89.2 91.7 92.6 89.4  PLT 119* 111* 85* 102*   Cardiac Enzymes:  Recent Labs Lab 12/17/15 2041  TROPONINI <0.03   BNP: BNP (last 3 results) No results for input(s): BNP in the last 8760 hours.  ProBNP (last 3 results) No results for input(s): PROBNP in the last 8760 hours.  CBG:  Recent Labs Lab 12/18/15 0431  GLUCAP 131*       SignedDomenic Polite MD.  [facility] 12/21/2015, 12:56 PM

## 2015-12-21 NOTE — Progress Notes (Signed)
SW spoke with daughter of pt who states that she has accepted [employer] offer. SW made [place]/Admissions aware. SW made physician aware.  Willette Brace [id number] ED CSW 12/21/2015 12:23 PM

## 2015-12-21 NOTE — Progress Notes (Signed)
ANTICOAGULATION CONSULT NOTE - Follow-up Consult  Pharmacy Consult for Coumadin Indication: atrial fibrillation / MVR  Allergies  Allergen Reactions  . Heparin Anaphylaxis    Patient Measurements: Height: 5\' 4"  (162.6 cm) Weight: 133 lb 9.6 oz (60.6 kg) IBW/kg (Calculated) : 54.7  Vital Signs: Temp: 98.9 F (37.2 C) (08/29 0553) Temp Source: Oral (08/29 0553) BP: 124/55 (08/29 0553) Pulse Rate: 77 (08/29 0553)  Labs:  Recent Labs  12/19/15 0348 12/20/15 0258 12/21/15 0517  HGB 9.2* 7.4* 9.1*  HCT 27.5* 22.4* 26.9*  PLT 111* 85* 102*  LABPROT 20.0* 24.0* 26.1*  INR 1.69 2.11 2.35  CREATININE 1.04* 1.22* 0.96    Estimated Creatinine Clearance: 33.6 mL/min (by C-G formula based on SCr of 0.96 mg/dL).   Medical History: Past Medical History:  Diagnosis Date  . Allergic rhinitis   . Atrial fibrillation ([place])   . Basal cell carcinoma   . Bilateral lower extremity edema    history of venous insufficiency  . Bullous pemphigoid   . CHF (congestive heart failure) ([place])   . Chronic anemia    heavy periods  . Chronic anticoagulation   . Coronary artery disease   . DVT (deep venous thrombosis) ([place])    possible in 1990s and does not remember being on blood thinner  . Glaucoma   . H/O mitral valve replacement 2001   mechanical heart valve; family states indication may have been MR from MVP or  rheumatic heart disease  . Hypertension   . Hypothyroidism   . 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 S/p nephrectomy    left  . Shortness of breath dyspnea   . Thyroid disease   . Urinary incontinence     Assessment: Warfarin PTA for AFib and MVR (goal = 2.5-3.5 per prior notes) held on admit for surgery and given vitamin k 1 mg x1, warfarin restarted 8/26 PM with no bridge given heparin allergy.   -INR trend up to 2.35  **PTA dosing = 2.5 mg MWF, 5 mg AOD  Goal of Therapy:  INR 2.5-3.5 Monitor platelets by anticoagulation protocol: Yes   Plan:   -Warfarin 5 mg x1 -Daily INR  Georga Bora, PharmD Clinical Pharmacist Pager: [phone] 12/21/2015 1:35 PM

## 2015-12-21 NOTE — Clinical Social Work Placement (Signed)
   CLINICAL SOCIAL WORK PLACEMENT  NOTE  Date:  12/21/2015  Patient Details  Name: Amanda Richardson MRN: [id number] Date of Birth: November 23, 1923  Clinical Social Work is seeking post-discharge placement for this patient at the [facility] level of care (*CSW will initial, date and re-position this form in  chart as items are completed):      Patient/family provided with [facility] Work Department's list of facilities offering this level of care within the geographic area requested by the patient (or if unable, by the patient's family).  Yes   Patient/family informed of their freedom to choose among providers that offer the needed level of care, that participate in Medicare, Medicaid or managed care program needed by the patient, have an available bed and are willing to accept the patient.  Yes   Patient/family informed of [place]'s ownership interest in [facility] and [facility], as well as of the fact that they are under no obligation to receive care at these facilities.  PASRR submitted to EDS on       PASRR number received on       Existing PASRR number confirmed on       FL2 transmitted to all facilities in geographic area requested by pt/family on       FL2 transmitted to all facilities within larger geographic area on       Patient informed that his/her managed care company has contracts with or will negotiate with certain facilities, including the following:        Yes   Patient/family informed of bed offers received.  Patient [facility] bed at  [facility])     Physician recommends and patient chooses bed at      Patient to be transferred to   on  .  Patient to be transferred to facility by       Patient family notified on   of transfer.  Name of family member notified:        PHYSICIAN       Additional Comment:    _______________________________________________ Bernita Buffy 12/21/2015, 1:26 PM

## 2015-12-22 LAB — PROTIME-INR
INR: 2.77
PROTHROMBIN TIME: 29.8 s — AB (ref 11.4–15.2)

## 2015-12-22 MED ORDER — WARFARIN SODIUM 5 MG PO TABS
2.5000 mg | ORAL_TABLET | Freq: Once | ORAL | Status: AC
  Administered 2015-12-22: 2.5 mg via ORAL
  Filled 2015-12-22: qty 1

## 2015-12-22 NOTE — Progress Notes (Signed)
Report called to Tonya at [facility]. 

## 2015-12-22 NOTE — Progress Notes (Signed)
ANTICOAGULATION CONSULT NOTE - Follow-up Consult  Pharmacy Consult for Coumadin Indication: atrial fibrillation / MVR  Allergies  Allergen Reactions  . Heparin Anaphylaxis    Patient Measurements: Height: 5\' 4"  (162.6 cm) Weight: 133 lb 9.6 oz (60.6 kg) IBW/kg (Calculated) : 54.7  Vital Signs: Temp: 98.4 F (36.9 C) (08/30 0531) Temp Source: Oral (08/30 0531) BP: 149/70 (08/30 0531) Pulse Rate: 84 (08/30 0531)  Labs:  Recent Labs  12/20/15 0258 12/21/15 0517 12/22/15 1524  HGB 7.4* 9.1*  --   HCT 22.4* 26.9*  --   PLT 85* 102*  --   LABPROT 24.0* 26.1* 29.8*  INR 2.11 2.35 2.77  CREATININE 1.22* 0.96  --     Estimated Creatinine Clearance: 33.6 mL/min (by C-G formula based on SCr of 0.96 mg/dL).   Medical History: Past Medical History:  Diagnosis Date  . Allergic rhinitis   . Atrial fibrillation ([place])   . Basal cell carcinoma   . Bilateral lower extremity edema    history of venous insufficiency  . Bullous pemphigoid   . CHF (congestive heart failure) ([place])   . Chronic anemia    not applicable since [id number] when pt had hysterectomy   . Chronic anticoagulation   . Coronary artery disease   . DVT (deep venous thrombosis) ([place])    possible in 1990s and does not remember being on blood thinner  . Glaucoma   . H/O mitral valve replacement 2001   mechanical heart valve; family states indication may have been MR from MVP or  rheumatic heart disease  . Hypertension   . Hypothyroidism   . 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 S/p nephrectomy    left  . Shortness of breath dyspnea   . Thyroid disease   . Urinary incontinence     Assessment: Warfarin PTA for AFib and MVR (goal = 2.5-3.5 per prior notes) held on admit for surgery and given vitamin k 1 mg x1, warfarin restarted 8/26 PM with no bridge given heparin allergy.   -INR therapeutic 2.77  **PTA dosing = 2.5 mg MWF, 5 mg AOD  Goal of Therapy:  INR 2.5-3.5 Monitor platelets by  anticoagulation protocol: Yes   Plan:  -Warfarin 2.5 mg x1 -Daily INR  Georga Bora, PharmD Clinical Pharmacist Pager: [phone] 12/22/2015 4:47 PM

## 2015-12-22 NOTE — Progress Notes (Signed)
SW has spoken with Ivin Booty of [facility] and provided her with authorization number. She states pt is welcomed to come. SW has called transportation.  SW made nurse aware.  Nurse Report Number: [id number]  [name], MSW

## 2015-12-23 ENCOUNTER — Non-Acute Institutional Stay (SKILLED_NURSING_FACILITY): Payer: Medicare Other | Admitting: Adult Health

## 2015-12-23 ENCOUNTER — Encounter: Payer: Self-pay | Admitting: Adult Health

## 2015-12-23 DIAGNOSIS — L120 Bullous pemphigoid: Secondary | ICD-10-CM | POA: Diagnosis not present

## 2015-12-23 DIAGNOSIS — Z954 Presence of other heart-valve replacement: Secondary | ICD-10-CM | POA: Diagnosis not present

## 2015-12-23 DIAGNOSIS — I5032 Chronic diastolic (congestive) heart failure: Secondary | ICD-10-CM

## 2015-12-23 DIAGNOSIS — I482 Chronic atrial fibrillation, unspecified: Secondary | ICD-10-CM

## 2015-12-23 DIAGNOSIS — E039 Hypothyroidism, unspecified: Secondary | ICD-10-CM | POA: Diagnosis not present

## 2015-12-23 DIAGNOSIS — D62 Acute posthemorrhagic anemia: Secondary | ICD-10-CM

## 2015-12-23 DIAGNOSIS — R2681 Unsteadiness on feet: Secondary | ICD-10-CM

## 2015-12-23 DIAGNOSIS — K5901 Slow transit constipation: Secondary | ICD-10-CM | POA: Diagnosis not present

## 2015-12-23 DIAGNOSIS — Z952 Presence of prosthetic heart valve: Secondary | ICD-10-CM

## 2015-12-23 DIAGNOSIS — E876 Hypokalemia: Secondary | ICD-10-CM

## 2015-12-23 DIAGNOSIS — S72002S Fracture of unspecified part of neck of left femur, sequela: Secondary | ICD-10-CM | POA: Diagnosis not present

## 2015-12-23 NOTE — Progress Notes (Signed)
Patient ID: EMONI DERICK, female   DOB: 04/26/1923, 82 y.o.   MRN: [id number]    DATE:  12/23/2015   MRN:  [id number]  BIRTHDAY: [birth year]  Facility:  Nursing Home Location:  [place] [facility] [place] Room Number: [id number]  LEVEL OF CARE:  SNF [phone])  Contact Information    Name [place] Daughter [phone]  [phone]   [place] Daughter [phone]  [phone]   [place] Relative [phone]  [phone]       Code Status History    Date Active Date Inactive Code Status Order ID Comments User Context   12/17/2015 11:01 PM 12/22/2015 10:27 PM Full Code [id number]  Etta Quill, DO ED   08/04/2014  8:39 PM 08/11/2014  7:05 PM DNR [id number]  Lavina Hamman, MD ED   03/01/2012  8:43 PM 03/04/2012  4:18 PM Full Code [id number]  Martie Round, RN Inpatient    Advance Directive Documentation   Flowsheet Row Most Recent Value  Type of Advance Directive  Out of facility DNR (pink MOST or yellow form)  Pre-existing out of facility DNR order (yellow form or pink MOST form)  No data  "MOST" Form in Place?  No data       Chief Complaint  Patient presents with  . Hospitalization Follow-up    HISTORY OF PRESENT ILLNESS:  This is a 82 year old female who has been admitted to [facility] on 12/22/15 from [facility]. She has PMH of mechanical MVR 2001, chronic afib on chronic Coumadin anticoagulation, chronic venous LE edema and hypertension. She had a mechanical fall @ home and she sustained a left hip femoral neck fracture. She had left hemiarthroplasty on 12/18/15.  She has been admitted for a short-term rehabilitation.   PAST MEDICAL HISTORY:  Past Medical History:  Diagnosis Date  . Allergic rhinitis   . Atrial fibrillation ([place])   . Basal cell carcinoma   . Bilateral lower extremity edema    history of venous insufficiency  . Bullous pemphigoid   . CHF (congestive heart failure) ([place])   . Chronic anemia     not applicable since [id number] when pt had hysterectomy   . Chronic anticoagulation   . Coronary artery disease   . DVT (deep venous thrombosis) ([place])    possible in 1990s and does not remember being on blood thinner  . Glaucoma   . H/O mitral valve replacement 2001   mechanical heart valve; family states indication may have been MR from MVP or  rheumatic heart disease  . Hypertension   . Hypothyroidism   . 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 S/p nephrectomy    left  . Shortness of breath dyspnea   . Thyroid disease   . Urinary incontinence      CURRENT MEDICATIONS: Reviewed  Patient's Medications  New Prescriptions   No medications on file  Previous Medications   ACETAMINOPHEN (TYLENOL) 325 MG TABLET    Take 2 tablets (650 mg total) by mouth every 6 (six) hours as needed for mild pain (or Fever >/= 101).   ATENOLOL (TENORMIN) 25 MG TABLET    Take 50 mg by mouth daily.    CALCIUM-VITAMIN D (OSCAL WITH D) 500-200 MG-UNIT PER TABLET    Take 1 tablet by mouth every evening.    CHOLECALCIFEROL (VITAMIN D-3) 1000 UNITS CAPS    Take 2 capsules by mouth every evening.  CLOBETASOL OINTMENT (TEMOVATE) 0.05 %    Apply 1 application topically as needed (blisters for bulous pemphigoid).    DIGOXIN (LANOXIN) 0.125 MG TABLET    Take 0.0625 mg by mouth daily.    FUROSEMIDE (LASIX) 40 MG TABLET    Take 1 tablet (40 mg total) by mouth daily.   HYDROCODONE-ACETAMINOPHEN (NORCO/VICODIN) 5-325 MG TABLET    Take 1-2 tablets by mouth every 6 (six) hours as needed for moderate pain.   KLOR-CON M20 20 MEQ TABLET    Take 1 tablet by mouth daily.    LATANOPROST (XALATAN) 0.005 % OPHTHALMIC SOLUTION    Place 1 drop into both eyes at bedtime.    LEVOTHYROXINE (SYNTHROID, LEVOTHROID) 50 MCG TABLET    Take 50 mcg by mouth daily.   LISINOPRIL (PRINIVIL,ZESTRIL) 10 MG TABLET    Take 10 mg by mouth daily.    MULTIPLE VITAMIN (MULTIVITAMIN WITH MINERALS) TABS    Take 1 tablet by mouth every  evening.    SENNA-DOCUSATE (SENOKOT-S) 8.6-50 MG TABLET    Take 1 tablet by mouth 2 (two) times daily.  Modified Medications   No medications on file  Discontinued Medications   COUMADIN 5 MG TABLET    Take 2.5-5 mg by mouth as directed. Mon, Wed, Fri: Take 2.5mg  Sun, Tues, Thurs, Sat= 5mg    WARFARIN (COUMADIN) 2.5 MG TABLET    Take 2.5 mg by mouth. Take M-W-F for chronic A.Fib   WARFARIN (COUMADIN) 5 MG TABLET    Take 5 mg by mouth. Tue, Thur, Sat, Sun     Allergies  Allergen Reactions  . Heparin Anaphylaxis     REVIEW OF SYSTEMS:  GENERAL: no change in appetite, no fatigue, no weight changes, no fever, chills or weakness EYES: Denies change in vision, dry eyes, eye pain, itching or discharge EARS: Denies change in hearing, ringing in ears, or earache NOSE: Denies nasal congestion or epistaxis MOUTH and THROAT: Denies oral discomfort, gingival pain or bleeding, pain from teeth or hoarseness   RESPIRATORY: no cough, SOB, DOE, wheezing, hemoptysis CARDIAC: no chest pain,  or palpitations, +edema GI: no abdominal pain, diarrhea, constipation, heart burn, nausea or vomiting GU: Denies dysuria, frequency, hematuria, incontinence, or discharge PSYCHIATRIC: Denies feeling of depression or anxiety. No report of hallucinations, insomnia, paranoia, or agitation    PHYSICAL EXAMINATION  GENERAL APPEARANCE: Well nourished. In no acute distress. Normal body habitus SKIN:  Left hip surgical incision has 23 staples and has slight serosanguinous drainage; she has multiple open and not open blisters on her bilateral upper thighs, bruising on all 4 extremities; has open wound on her gluteal cleft HEAD: Normal in size and contour. No evidence of trauma EYES: Lids open and close normally. No blepharitis, entropion or ectropion. PERRL. Conjunctivae are clear and sclerae are white. Lenses are without opacity EARS: Pinnae are normal. Patient hears normal voice tunes of the examiner MOUTH and  THROAT: Lips are without lesions. Oral mucosa is moist and without lesions. Tongue is normal in shape, size, and color and without lesions NECK: supple, trachea midline, no neck masses, no thyroid tenderness, no thyromegaly LYMPHATICS: no LAN in the neck, no supraclavicular LAN RESPIRATORY: breathing is even & unlabored, BS CTAB CARDIAC: RRR, no murmur,no extra heart sounds, no edema GI: abdomen soft, normal BS, no masses, no tenderness, no hepatomegaly, no splenomegaly EXTREMITIES:  Able to move X 4 extremities; BLE has generalized weakness PSYCHIATRIC: Alert and oriented X 3. Affect and behavior are appropriate  LABS/RADIOLOGY: Labs reviewed:  Basic Metabolic Panel:  Recent Labs  12/19/15 0348 12/20/15 0258 12/21/15 0517  NA 140 137 138  K 3.9 3.8 3.9  CL 108 105 106  CO2 26 25 27   GLUCOSE 131* 129* 105*  BUN 17 26* 26*  CREATININE 1.04* 1.22* 0.96  CALCIUM 8.2* 7.9* 7.9*   Liver Function Tests:  Recent Labs  12/17/15 2041  AST 45*  ALT 28  ALKPHOS 64  BILITOT 1.5*  PROT 7.0  ALBUMIN 3.8   CBC:  Recent Labs  12/17/15 2041 12/19/15 0348 12/20/15 0258 12/21/15 0517  WBC 7.8 11.1* 10.9* 9.5  NEUTROABS 6.6  --   --   --   HGB 12.8 9.2* 7.4* 9.1*  HCT 36.4 27.5* 22.4* 26.9*  MCV 89.2 91.7 92.6 89.4  PLT 119* 111* 85* 102*   Cardiac Enzymes:  Recent Labs  12/17/15 2041  TROPONINI <0.03   CBG:  Recent Labs  12/18/15 0431  GLUCAP 131*      Dg Chest 1 View  Result Date: 12/17/2015 CLINICAL DATA:  Status post fall.  History of AFib and CHF. EXAM: CHEST 1 VIEW COMPARISON:  Chest radiograph 08/10/2014 and before FINDINGS: The heart remains massively enlarged. There is right retrocardiac consolidation. Previously seen pleural effusion on the right has resolved. No pneumothorax. IMPRESSION: 1. Massive cardiomegaly, unchanged. 2. Probable superimposed right lower lobe consolidation. This could indicate developing infection or atelectasis. Electronically  Signed   By: Ulyses Jarred M.D.   On: 12/17/2015 20:52   Pelvis Portable  Result Date: 12/18/2015 CLINICAL DATA:  Post left hip hemiarthroplasty EXAM: PORTABLE PELVIS 1-2 VIEWS COMPARISON:  CT urogram 03/29/2005 FINDINGS: Postoperative left hip hemiarthroplasty using cemented femoral component. Components appear well seated. No evidence of acute fracture or dislocation. Skin clips and subcutaneous soft tissue gas consistent with recent surgery. IMPRESSION: Postoperative left hip hemiarthroplasty. Component appears well seated. Electronically Signed   By: Lucienne Capers M.D.   On: 12/18/2015 19:14   Dg Hip Unilat W Or Wo Pelvis 2-3 Views Left  Result Date: 12/17/2015 CLINICAL DATA:  Fall with hip pain.  LEFT hip pain EXAM: DG HIP (WITH OR WITHOUT PELVIS) 2-3V LEFT COMPARISON:  None. FINDINGS: Subcapital fracture of the LEFT femoral neck with varus angulation. There is mild migration of the femur superiorly. IMPRESSION: Subcapital LEFT femoral neck fracture Electronically Signed   By: Suzy Bouchard M.D.   On: 12/17/2015 20:50    ASSESSMENT/PLAN:  Unsteady gait - for rehabilitation, PT and OT, for therapeutic strengthening exercises; fall precaution  Left hip fracture S/P hemiarthroplasty - for rehabilitation, PT and OT, for therapeutic strengthening exercises; continue Norco 5/325 mg 1-2 tab PO Q 6 hours PRN, Tylenol 325 mg 2 tabs = 650 mg PO Q 6 hours PRN for pain; Coumadin for DVT prophylaxis; follow-up with orthopedic surgeon, Dr. Tania Ade, in 1 week  Chronic atrial fibrillation - rate-controlled; continue Atenolol 50 mg 1 tab daily, Digoxin 0.125 mg give 1/2 tab= 0.0625 mg po daily; check Digoxin level in 1 week Lab Results  Component Value Date   DIGOXIN 0.2 (L) 12/17/2015   History of Mechanical MVR - continue Coumadin; re-check INR   Chronic diastolic CHF - no SOB; continue Digoxin 0.0625 mg daily, Lisinopril 10 mg 1 tab daily and Lasix 40 mg daily; weigh daily  Hypokalemia  - continue [place] con 20 meq 1 tab daily; check BMP Lab Results  Component Value Date   K 3.9 12/21/2015   Hypothyroidism - continue Synthroid 50 mcg daily;  check tsh  Bullous pemphigoid - continue to apply Clobetasol ointment 0.05% topically daily to blisters on bilateral upper thighs  Anemia, acute blood loss - re-check CBC Lab Results  Component Value Date   HGB 9.1 (L) 12/21/2015   Constipation - continue Senokot - S 1 tab PO BID     Goals of care:  Short-term rehabilitation    Durenda Age, NP [place] [phone]

## 2015-12-24 ENCOUNTER — Non-Acute Institutional Stay (SKILLED_NURSING_FACILITY): Payer: Medicare Other | Admitting: Internal Medicine

## 2015-12-24 ENCOUNTER — Encounter: Payer: Self-pay | Admitting: Internal Medicine

## 2015-12-24 DIAGNOSIS — I5032 Chronic diastolic (congestive) heart failure: Secondary | ICD-10-CM

## 2015-12-24 DIAGNOSIS — E039 Hypothyroidism, unspecified: Secondary | ICD-10-CM | POA: Diagnosis not present

## 2015-12-24 DIAGNOSIS — R2681 Unsteadiness on feet: Secondary | ICD-10-CM

## 2015-12-24 DIAGNOSIS — D696 Thrombocytopenia, unspecified: Secondary | ICD-10-CM

## 2015-12-24 DIAGNOSIS — S72002D Fracture of unspecified part of neck of left femur, subsequent encounter for closed fracture with routine healing: Secondary | ICD-10-CM | POA: Diagnosis not present

## 2015-12-24 DIAGNOSIS — I482 Chronic atrial fibrillation, unspecified: Secondary | ICD-10-CM

## 2015-12-24 DIAGNOSIS — K219 Gastro-esophageal reflux disease without esophagitis: Secondary | ICD-10-CM | POA: Diagnosis not present

## 2015-12-24 DIAGNOSIS — L120 Bullous pemphigoid: Secondary | ICD-10-CM | POA: Diagnosis not present

## 2015-12-24 DIAGNOSIS — Z954 Presence of other heart-valve replacement: Secondary | ICD-10-CM

## 2015-12-24 DIAGNOSIS — K5900 Constipation, unspecified: Secondary | ICD-10-CM

## 2015-12-24 DIAGNOSIS — K5909 Other constipation: Secondary | ICD-10-CM

## 2015-12-24 DIAGNOSIS — D62 Acute posthemorrhagic anemia: Secondary | ICD-10-CM

## 2015-12-24 DIAGNOSIS — Z952 Presence of prosthetic heart valve: Secondary | ICD-10-CM

## 2015-12-24 LAB — TSH: TSH: 3.56 u[IU]/mL (ref 0.41–5.90)

## 2015-12-24 LAB — BASIC METABOLIC PANEL
BUN: 27 mg/dL — AB (ref 4–21)
CREATININE: 0.9 mg/dL (ref 0.5–1.1)
GLUCOSE: 93 mg/dL
POTASSIUM: 4.5 mmol/L (ref 3.4–5.3)
Sodium: 140 mmol/L (ref 137–147)

## 2015-12-24 NOTE — Progress Notes (Signed)
LOCATION: [place]  PCP: Merrilee Seashore, MD   Code Status: DNR  Goals of care: Advanced Directive information Advanced Directives 12/23/2015  Does patient have an advance directive? Yes  Type of Advance Directive Out of facility DNR (pink MOST or yellow form)  Does patient want to make changes to advanced directive? No - Patient declined  Copy of advanced directive(s) in chart? Yes  Pre-existing out of facility DNR order (yellow form or pink MOST form) -       Extended Emergency Contact Information Primary Emergency Contact: Hart,[name] Address: [address]          [place] [address] Johnnette Litter of [place] Phone: [phone] Mobile Phone: [phone] Relation: Daughter Secondary Emergency Contact: [place] of [place] Phone: [phone] Mobile Phone: [phone] Relation: Daughter   Allergies  Allergen Reactions  . Heparin Anaphylaxis    Chief Complaint  Patient presents with  . New Admit To SNF    New Admission     HPI:  Patient is a 82 y.o. female seen today for short term rehabilitation post hospital admission from 12/17/15-12/22/15 with left displaced femoral neck fracture post fall. She underwent left hemiarthroplasty. She had acute blood loss anemia and required 2 u prbc transfusion. She has PMH of mechanical MVR 2001, chronic afib on coumadin, hypertension among others.  She is seen in her room today.   Review of Systems:  Constitutional: Negative for fever, chills.  HENT: Negative for headache, congestion, nasal discharge, difficulty swallowing. Positive for some throat discomfort. she is hard of hearing.  Eyes: Negative for double vision and discharge.  Respiratory: Negative for shortness of breath and wheezing. Positive for occasional cough.  Cardiovascular: Negative for chest pain, palpitations, leg swelling.  Gastrointestinal: Negative for heartburn, nausea, vomiting, abdominal pain. Last bowel movement  was today. Genitourinary: Negative for dysuria and flank pain.  Musculoskeletal: Negative for back pain, fall in the facility.  Skin: Negative for itching, rash.  Neurological: Positive for dizziness with sudden change of position. Positive for generalized weakness Psychiatric/Behavioral: Negative for depression   Past Medical History:  Diagnosis Date  . Allergic rhinitis   . Atrial fibrillation ([place])   . Basal cell carcinoma   . Bilateral lower extremity edema    history of venous insufficiency  . Bullous pemphigoid   . CHF (congestive heart failure) ([place])   . Chronic anemia    not applicable since [id number] when pt had hysterectomy   . Chronic anticoagulation   . Coronary artery disease   . DVT (deep venous thrombosis) ([place])    possible in 1990s and does not remember being on blood thinner  . Glaucoma   . H/O mitral valve replacement 2001   mechanical heart valve; family states indication may have been MR from MVP or  rheumatic heart disease  . Hypertension   . Hypothyroidism   . 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 S/p nephrectomy    left  . Shortness of breath dyspnea   . Thyroid disease   . Urinary incontinence    Past Surgical History:  Procedure Laterality Date  . ABDOMINAL HYSTERECTOMY  1960s  . APPENDECTOMY  1960s  . CORONARY ARTERY BYPASS GRAFT  01/2000  . HERNIA REPAIR    . HIP ARTHROPLASTY Left 12/18/2015   Procedure: HEMIARTHROPLASTY  HIP;  Surgeon: Tania Ade, MD;  Location: [facility];  Service: Orthopedics;  Laterality: Left;  . hx broken collarbone    .  LAPAROSCOPIC SMALL BOWEL RESECTION  2004 or 5   adhesions and ischemic bowel  . MITRAL VALVE REPLACEMENT  01/2000   mechanical heart valve  . TONSILLECTOMY  1933  . TRANSTHORACIC ECHOCARDIOGRAM  02/2012   EF 60-65%, mild LVH; mildly thickened AV leaflets with sclerosis and moderate regurg; mechanical MV prosthesis; LA dilated; RVSP increased; RA severely dilated; increased LA  pressure; moderate TR   Social History:   reports that she has never smoked. She has never used smokeless tobacco. She reports that she does not drink alcohol or use drugs.  Family History  Problem Relation Age of Onset  . Irregular heart beat Mother   . Other Mother     pellegra  . Heart failure Mother     had very swollen legs??  . Stroke Mother   . Prostate cancer Father     also skin cancer  . Heart attack Daughter   . Heart disease Brother   . Heart attack Brother 66    still alive    Medications:   Medication List       Accurate as of 12/24/15  1:25 PM. Always use your most recent med list.          acetaminophen 325 MG tablet Commonly known as:  TYLENOL Take 2 tablets (650 mg total) by mouth every 6 (six) hours as needed for mild pain (or Fever >/= 101).   atenolol 25 MG tablet Commonly known as:  TENORMIN Take 50 mg by mouth daily.   calcium-vitamin D 500-200 MG-UNIT tablet Commonly known as:  OSCAL WITH D Take 1 tablet by mouth every evening.   clobetasol ointment 0.05 % Commonly known as:  TEMOVATE Apply 1 application topically as needed (blisters for bulous pemphigoid).   digoxin 0.125 MG tablet Commonly known as:  LANOXIN Take 0.0625 mg by mouth daily.   furosemide 40 MG tablet Commonly known as:  LASIX Take 1 tablet (40 mg total) by mouth daily.   HYDROcodone-acetaminophen 5-325 MG tablet Commonly known as:  NORCO/VICODIN Take 1-2 tablets by mouth every 6 (six) hours as needed for moderate pain.   KLOR-CON M20 20 MEQ tablet Generic drug:  potassium chloride SA Take 1 tablet by mouth daily.   latanoprost 0.005 % ophthalmic solution Commonly known as:  XALATAN Place 1 drop into both eyes at bedtime.   levothyroxine 50 MCG tablet Commonly known as:  SYNTHROID, LEVOTHROID Take 50 mcg by mouth daily.   lisinopril 10 MG tablet Commonly known as:  PRINIVIL,ZESTRIL Take 10 mg by mouth daily.   multivitamin with minerals Tabs tablet Take 1  tablet by mouth every evening.   omeprazole 20 MG capsule Commonly known as:  PRILOSEC Take 20 mg by mouth daily.   senna-docusate 8.6-50 MG tablet Commonly known as:  Senokot-S Take 1 tablet by mouth 2 (two) times daily.   Vitamin D-3 1000 units Caps Take 2 capsules by mouth every evening.       Immunizations: Immunization History  Administered Date(s) Administered  . PPD Test 12/22/2015     Physical Exam: Vitals:   12/24/15 1321  BP: 122/60  Pulse: 81  Resp: 16  Temp: 98.1 F (36.7 C)  TempSrc: Oral  SpO2: 95%  Weight: 133 lb (60.3 kg)  Height: 5\' 4"  (1.626 m)   Body mass index is 22.83 kg/m.  General- elderly female, frail and thin built, in no acute distress Head- normocephalic, atraumatic Nose- no nasal discharge Throat- moist mucus membrane Eyes- no pallor, no  icterus Neck- no cervical lymphadenopathy Cardiovascular- irregular heart rate, + murmur, trace leg edema Respiratory- bilateral clear to auscultation, no wheeze, no rhonchi, no crackles, no use of accessory muscles Abdomen- bowel sounds present, soft, non tender Musculoskeletal- able to move all 4 extremities, limited left hip range of motion Neurological- alert and oriented to person, place and time Skin- warm and dry, left hip surgical incision with staples in place and some drainage, no signs of infection, clean dressing over it. bruise to both her arms and her left thigh area, heel floaters present Psychiatry- normal mood and affect    Labs reviewed: Basic Metabolic Panel:  Recent Labs  12/19/15 0348 12/20/15 0258 12/21/15 0517  NA 140 137 138  K 3.9 3.8 3.9  CL 108 105 106  CO2 26 25 27   GLUCOSE 131* 129* 105*  BUN 17 26* 26*  CREATININE 1.04* 1.22* 0.96  CALCIUM 8.2* 7.9* 7.9*   Liver Function Tests:  Recent Labs  12/17/15 2041  AST 45*  ALT 28  ALKPHOS 64  BILITOT 1.5*  PROT 7.0  ALBUMIN 3.8   No results for input(s): LIPASE, AMYLASE in the last 8760 hours. No  results for input(s): AMMONIA in the last 8760 hours. CBC:  Recent Labs  12/17/15 2041 12/19/15 0348 12/20/15 0258 12/21/15 0517  WBC 7.8 11.1* 10.9* 9.5  NEUTROABS 6.6  --   --   --   HGB 12.8 9.2* 7.4* 9.1*  HCT 36.4 27.5* 22.4* 26.9*  MCV 89.2 91.7 92.6 89.4  PLT 119* 111* 85* 102*   Cardiac Enzymes:  Recent Labs  12/17/15 2041  TROPONINI <0.03   BNP: Invalid input(s): POCBNP CBG:  Recent Labs  12/18/15 0431  GLUCAP 131*    Radiological Exams: Dg Chest 1 View  Result Date: 12/17/2015 CLINICAL DATA:  Status post fall.  History of AFib and CHF. EXAM: CHEST 1 VIEW COMPARISON:  Chest radiograph 08/10/2014 and before FINDINGS: The heart remains massively enlarged. There is right retrocardiac consolidation. Previously seen pleural effusion on the right has resolved. No pneumothorax. IMPRESSION: 1. Massive cardiomegaly, unchanged. 2. Probable superimposed right lower lobe consolidation. This could indicate developing infection or atelectasis. Electronically Signed   By: Ulyses Jarred M.D.   On: 12/17/2015 20:52   Pelvis Portable  Result Date: 12/18/2015 CLINICAL DATA:  Post left hip hemiarthroplasty EXAM: PORTABLE PELVIS 1-2 VIEWS COMPARISON:  CT urogram 03/29/2005 FINDINGS: Postoperative left hip hemiarthroplasty using cemented femoral component. Components appear well seated. No evidence of acute fracture or dislocation. Skin clips and subcutaneous soft tissue gas consistent with recent surgery. IMPRESSION: Postoperative left hip hemiarthroplasty. Component appears well seated. Electronically Signed   By: Lucienne Capers M.D.   On: 12/18/2015 19:14   Dg Hip Unilat W Or Wo Pelvis 2-3 Views Left  Result Date: 12/17/2015 CLINICAL DATA:  Fall with hip pain.  LEFT hip pain EXAM: DG HIP (WITH OR WITHOUT PELVIS) 2-3V LEFT COMPARISON:  None. FINDINGS: Subcapital fracture of the LEFT femoral neck with varus angulation. There is mild migration of the femur superiorly. IMPRESSION:  Subcapital LEFT femoral neck fracture Electronically Signed   By: Suzy Bouchard M.D.   On: 12/17/2015 20:50    Assessment/Plan  Gait instability Will have patient work with PT/OT as tolerated to regain strength and restore function.  Fall precautions are in place.  Left femoral neck fracture S/p left hip hemiarthroplasty. Will have her work with physical therapy and occupational therapy team to help with gait training and muscle strengthening exercises.fall  precautions. Skin care. Encourage to be out of bed. Has orthopedic follow up. Continue norco 5-325 mg 1-2 tab q6h prn pain. Continue coumadin for dvt prophylaxis.   Blood loss anemia Post op, monitor cbc. He is s/p 2 u prbc transfusion in hospital.   Thrombocytopenia No bleed, easy bruising, monitor platelet count  Bullous pemphigoid continue skin care with clobetasol ointment application for now and monitor  Chronic atrial fibrillation continue atenolol 50 mg daily with digoxin 0.0625 mg daily. Continue coumadin for anticoagulation. INR 2.9 today. Coumadin had been on hold. Start coumadin 3 mg po daily and check INR 12/27/15, goal inr 2-3  Chronic diastolic CHF Stable, continue atenolol 50 mg daily, lisinopril 10 mg daily and furosemide 40 mg daily. Monitor weight and bmp. Continue kcl  Chronic constipation Continue senokot s 1 tab bid   History of Mechanical MVR [employer] mechanical valve in 2001.   Hypothyroidism Continue levothyroxine 50 mcg daily  gerd Stable, continue omeprazole 20 mg daily   Goals of care: short term rehabilitation   Labs/tests ordered: cbc, cmp, INR  Family/ staff Communication: reviewed care plan with patient and nursing supervisor    Blanchie Serve, MD Internal Medicine [place], [place] [address] Cell Phone (Monday-Friday 8 am - 5 pm): [phone] On Call: [phone] and follow prompts after 5 pm and on weekends Office  Phone: [phone] Office Fax: [phone]

## 2015-12-28 LAB — CBC AND DIFFERENTIAL
HEMATOCRIT: 30 % — AB (ref 36–46)
Hemoglobin: 10 g/dL — AB (ref 12.0–16.0)
NEUTROS ABS: 6300 /uL
Platelets: 342 10*3/uL (ref 150–399)
WBC: 9 10^3/mL

## 2015-12-28 LAB — BASIC METABOLIC PANEL
BUN: 18 mg/dL (ref 4–21)
Creatinine: 1.1 mg/dL (ref 0.5–1.1)
Glucose: 107 mg/dL
POTASSIUM: 3.8 mmol/L (ref 3.4–5.3)
SODIUM: 138 mmol/L (ref 137–147)

## 2016-01-03 ENCOUNTER — Non-Acute Institutional Stay (SKILLED_NURSING_FACILITY): Payer: Medicare Other | Admitting: Adult Health

## 2016-01-03 ENCOUNTER — Encounter: Payer: Self-pay | Admitting: Adult Health

## 2016-01-03 DIAGNOSIS — I5032 Chronic diastolic (congestive) heart failure: Secondary | ICD-10-CM

## 2016-01-03 DIAGNOSIS — I482 Chronic atrial fibrillation, unspecified: Secondary | ICD-10-CM

## 2016-01-03 NOTE — Progress Notes (Signed)
Patient ID: Amanda Richardson, female   DOB: [birth year], 82 y.o.   MRN: [id number]    DATE:    01/03/16  MRN:  [id number]  BIRTHDAY: 1923-08-14  Facility:  Nursing Home Location:  [place] Room Number: [id number]  LEVEL OF CARE:  SNF [phone])  Contact Information    Name [place] Daughter [phone]  [phone]   [place] Daughter [phone]  [phone]   [place] Relative [phone]  [phone]       Code Status History    Date Active Date Inactive Code Status Order ID Comments User Context   12/17/2015 11:01 PM 12/22/2015 10:27 PM Full Code [id number]  Etta Quill, DO ED   08/04/2014  8:39 PM 08/11/2014  7:05 PM DNR [id number]  Lavina Hamman, MD ED   03/01/2012  8:43 PM 03/04/2012  4:18 PM Full Code [id number]  Martie Round, RN Inpatient    Advance Directive Documentation   Flowsheet Row Most Recent Value  Type of Advance Directive  Out of facility DNR (pink MOST or yellow form)  Pre-existing out of facility DNR order (yellow form or pink MOST form)  No data  "MOST" Form in Place?  No data       Chief Complaint  Patient presents with  . Acute Visit    Decreased digoxin level    HISTORY OF PRESENT ILLNESS:  This is a 82 year old female who was noted to have Digoxin level 0.4 and currently taking Digoxin 0.0625 mg Q  day for CHF and Afib. No SOB nor chest pain.  She has been admitted to [facility] on 12/22/15 from [facility]. She has PMH of mechanical MVR 2001, chronic afib on chronic Coumadin anticoagulation, chronic venous LE edema and hypertension. She had a mechanical fall @ home and she sustained a left hip femoral neck fracture. She had left hemiarthroplasty on 12/18/15.  She is currently having a short-term rehabilitation.   PAST MEDICAL HISTORY:  Past Medical History:  Diagnosis Date  . Allergic rhinitis   . Atrial fibrillation ([place])   . Basal cell carcinoma   . Bilateral  lower extremity edema    history of venous insufficiency  . Bullous pemphigoid   . CHF (congestive heart failure) ([place])   . Chronic anemia    not applicable since [id number] when pt had hysterectomy   . Chronic anticoagulation   . Coronary artery disease   . DVT (deep venous thrombosis) ([place])    possible in 1990s and does not remember being on blood thinner  . Glaucoma   . H/O mitral valve replacement 2001   mechanical heart valve; family states indication may have been MR from MVP or  rheumatic heart disease  . Hypertension   . Hypothyroidism   . Ischemic bowel disease ([place])    missing 2/3 of bowel  . S/p nephrectomy    left  . Shortness of breath dyspnea   . Thyroid disease   . Urinary incontinence      CURRENT MEDICATIONS: Reviewed  Patient's Medications  New Prescriptions   No medications on file  Previous Medications   ACETAMINOPHEN (TYLENOL) 325 MG TABLET    Take 325 mg by mouth daily. QAM   ACETAMINOPHEN (TYLENOL) 650 MG CR TABLET    Take 650 mg by mouth every 4 (four) hours as needed for pain.   ATENOLOL (TENORMIN) 25 MG TABLET    Take 50 mg by mouth daily.  CALCIUM-VITAMIN D (OSCAL WITH D) 500-200 MG-UNIT PER TABLET    Take 1 tablet by mouth every evening.    CHOLECALCIFEROL 2000 UNITS CAPS    Take 1 capsule by mouth every evening.   CLOBETASOL OINTMENT (TEMOVATE) 0.05 %    Apply 1 application topically as needed (blisters for bulous pemphigoid).    DIGOXIN (LANOXIN) 0.125 MG TABLET    Take 0.125 mg by mouth daily.    FUROSEMIDE (LASIX) 40 MG TABLET    Take 1 tablet (40 mg total) by mouth daily.   KLOR-CON M20 20 MEQ TABLET    Take 1 tablet by mouth daily.    LATANOPROST (XALATAN) 0.005 % OPHTHALMIC SOLUTION    Place 1 drop into both eyes at bedtime.    LEVOTHYROXINE (SYNTHROID, LEVOTHROID) 50 MCG TABLET    Take 50 mcg by mouth daily.   LISINOPRIL (PRINIVIL,ZESTRIL) 10 MG TABLET    Take 10 mg by mouth daily.    LORAZEPAM (ATIVAN) 0.5 MG TABLET    Take 0.5 mg by mouth  2 (two) times daily as needed for anxiety.   MELATONIN 3 MG TABS    Take 1 tablet by mouth at bedtime as needed.   MULTIPLE VITAMIN (MULTIVITAMIN WITH MINERALS) TABS    Take 1 tablet by mouth every evening.    OMEPRAZOLE (PRILOSEC) 20 MG CAPSULE    Take 20 mg by mouth daily.    SENNA-DOCUSATE (SENOKOT-S) 8.6-50 MG TABLET    Take 1 tablet by mouth 2 (two) times daily.  Modified Medications   No medications on file  Discontinued Medications   ACETAMINOPHEN (TYLENOL) 325 MG TABLET    Take 2 tablets (650 mg total) by mouth every 6 (six) hours as needed for mild pain (or Fever >/= 101).   CHOLECALCIFEROL (VITAMIN D-3) 1000 UNITS CAPS    Take 2 capsules by mouth every evening.    HYDROCODONE-ACETAMINOPHEN (NORCO/VICODIN) 5-325 MG TABLET    Take 1-2 tablets by mouth every 6 (six) hours as needed for moderate pain.     Allergies  Allergen Reactions  . Heparin Anaphylaxis     REVIEW OF SYSTEMS:  GENERAL: no change in appetite, no fatigue, no weight changes, no fever, chills or weakness EYES: Denies change in vision, dry eyes, eye pain, itching or discharge EARS: Denies change in hearing, ringing in ears, or earache NOSE: Denies nasal congestion or epistaxis MOUTH and THROAT: Denies oral discomfort, gingival pain or bleeding, pain from teeth or hoarseness   RESPIRATORY: no cough, SOB, DOE, wheezing, hemoptysis CARDIAC: no chest pain,  or palpitations, +edema GI: no abdominal pain, diarrhea, constipation, heart burn, nausea or vomiting GU: Denies dysuria, frequency, hematuria, incontinence, or discharge PSYCHIATRIC: Denies feeling of depression or anxiety. No report of hallucinations, insomnia, paranoia, or agitation    PHYSICAL EXAMINATION  GENERAL APPEARANCE: Well nourished. In no acute distress. Normal body habitus SKIN:  Left hip surgical incision has 23 staples and has slight serosanguinous drainage; she has multiple open and not open blisters on her bilateral upper thighs, bruising  on all 4 extremities; has open wound on her gluteal cleft HEAD: Normal in size and contour. No evidence of trauma EYES: Lids open and close normally. No blepharitis, entropion or ectropion. PERRL. Conjunctivae are clear and sclerae are white. Lenses are without opacity EARS: Pinnae are normal. Patient hears normal voice tunes of the examiner MOUTH and THROAT: Lips are without lesions. Oral mucosa is moist and without lesions. Tongue is normal in shape, size, and color  and without lesions NECK: supple, trachea midline, no neck masses, no thyroid tenderness, no thyromegaly LYMPHATICS: no LAN in the neck, no supraclavicular LAN RESPIRATORY: breathing is even & unlabored, BS CTAB CARDIAC: RRR, no murmur,no extra heart sounds, BLE 2+edema GI: abdomen soft, normal BS, no masses, no tenderness, no hepatomegaly, no splenomegaly EXTREMITIES:  Able to move X 4 extremities; BLE has generalized weakness PSYCHIATRIC: Alert and oriented X 3. Affect and behavior are appropriate  LABS/RADIOLOGY: Labs reviewed: Basic Metabolic Panel:  Recent Labs  12/19/15 0348 12/20/15 0258 12/21/15 0517 12/24/15 12/28/15  NA 140 137 138 140 138  K 3.9 3.8 3.9 4.5 3.8  CL 108 105 106  --   --   CO2 26 25 27   --   --   GLUCOSE 131* 129* 105*  --   --   BUN 17 26* 26* 27* 18  CREATININE 1.04* 1.22* 0.96 0.9 1.1  CALCIUM 8.2* 7.9* 7.9*  --   --    Liver Function Tests:  Recent Labs  12/17/15 2041  AST 45*  ALT 28  ALKPHOS 64  BILITOT 1.5*  PROT 7.0  ALBUMIN 3.8   CBC:  Recent Labs  12/17/15 2041 12/19/15 0348 12/20/15 0258 12/21/15 0517 12/28/15  WBC 7.8 11.1* 10.9* 9.5 9.0  NEUTROABS 6.6  --   --   --  6,300  HGB 12.8 9.2* 7.4* 9.1* 10.0*  HCT 36.4 27.5* 22.4* 26.9* 30*  MCV 89.2 91.7 92.6 89.4  --   PLT 119* 111* 85* 102* 342   Cardiac Enzymes:  Recent Labs  12/17/15 2041  TROPONINI <0.03   CBG:  Recent Labs  12/18/15 0431  GLUCAP 131*      Dg Chest 1 View  Result Date:  12/17/2015 CLINICAL DATA:  Status post fall.  History of AFib and CHF. EXAM: CHEST 1 VIEW COMPARISON:  Chest radiograph 08/10/2014 and before FINDINGS: The heart remains massively enlarged. There is right retrocardiac consolidation. Previously seen pleural effusion on the right has resolved. No pneumothorax. IMPRESSION: 1. Massive cardiomegaly, unchanged. 2. Probable superimposed right lower lobe consolidation. This could indicate developing infection or atelectasis. Electronically Signed   By: Ulyses Jarred M.D.   On: 12/17/2015 20:52   Pelvis Portable  Result Date: 12/18/2015 CLINICAL DATA:  Post left hip hemiarthroplasty EXAM: PORTABLE PELVIS 1-2 VIEWS COMPARISON:  CT urogram 03/29/2005 FINDINGS: Postoperative left hip hemiarthroplasty using cemented femoral component. Components appear well seated. No evidence of acute fracture or dislocation. Skin clips and subcutaneous soft tissue gas consistent with recent surgery. IMPRESSION: Postoperative left hip hemiarthroplasty. Component appears well seated. Electronically Signed   By: Lucienne Capers M.D.   On: 12/18/2015 19:14   Dg Hip Unilat W Or Wo Pelvis 2-3 Views Left  Result Date: 12/17/2015 CLINICAL DATA:  Fall with hip pain.  LEFT hip pain EXAM: DG HIP (WITH OR WITHOUT PELVIS) 2-3V LEFT COMPARISON:  None. FINDINGS: Subcapital fracture of the LEFT femoral neck with varus angulation. There is mild migration of the femur superiorly. IMPRESSION: Subcapital LEFT femoral neck fracture Electronically Signed   By: Suzy Bouchard M.D.   On: 12/17/2015 20:50    ASSESSMENT/PLAN:   Chronic atrial fibrillation - rate-controlled;  increase Digoxin to 0.125 mg 1 tab PO daily and check Digoxin level in 1 week; continue Atenolol 50 mg 1 tab daily Lab Results  Component Value Date   DIGOXIN 0.2 (L) 12/17/2015   Chronic diastolic CHF - no SOB; increase Digoxin 0.125 mg daily and continue Lisinopril  10 mg 1 tab daily and Lasix 40 mg daily     Durenda Age, NP [facility] [phone]

## 2016-01-11 ENCOUNTER — Non-Acute Institutional Stay (SKILLED_NURSING_FACILITY): Payer: Medicare Other | Admitting: Adult Health

## 2016-01-11 ENCOUNTER — Encounter: Payer: Self-pay | Admitting: Adult Health

## 2016-01-11 DIAGNOSIS — Z954 Presence of other heart-valve replacement: Secondary | ICD-10-CM | POA: Diagnosis not present

## 2016-01-11 DIAGNOSIS — K5901 Slow transit constipation: Secondary | ICD-10-CM | POA: Diagnosis not present

## 2016-01-11 DIAGNOSIS — D62 Acute posthemorrhagic anemia: Secondary | ICD-10-CM | POA: Diagnosis not present

## 2016-01-11 DIAGNOSIS — R2681 Unsteadiness on feet: Secondary | ICD-10-CM | POA: Diagnosis not present

## 2016-01-11 DIAGNOSIS — Z7901 Long term (current) use of anticoagulants: Secondary | ICD-10-CM

## 2016-01-11 DIAGNOSIS — E039 Hypothyroidism, unspecified: Secondary | ICD-10-CM

## 2016-01-11 DIAGNOSIS — I5032 Chronic diastolic (congestive) heart failure: Secondary | ICD-10-CM

## 2016-01-11 DIAGNOSIS — S72002S Fracture of unspecified part of neck of left femur, sequela: Secondary | ICD-10-CM

## 2016-01-11 DIAGNOSIS — E876 Hypokalemia: Secondary | ICD-10-CM | POA: Diagnosis not present

## 2016-01-11 DIAGNOSIS — I482 Chronic atrial fibrillation, unspecified: Secondary | ICD-10-CM

## 2016-01-11 DIAGNOSIS — L120 Bullous pemphigoid: Secondary | ICD-10-CM | POA: Diagnosis not present

## 2016-01-11 DIAGNOSIS — Z952 Presence of prosthetic heart valve: Secondary | ICD-10-CM

## 2016-01-11 NOTE — Progress Notes (Signed)
Patient ID: Amanda Richardson, female   DOB: 1923/10/25, 82 y.o.   MRN: [id number]    DATE:  01/11/2016   MRN:  [id number]  BIRTHDAY: 02-22-1924  Facility:  Nursing Home Location:  [place] Room Number: [id number]  LEVEL OF CARE:  SNF [phone])  Contact Information    Name [place] Daughter [phone]  [phone]   [place] Daughter [phone]  [phone]   [place] Relative [phone]  [phone]       Code Status History    Date Active Date Inactive Code Status Order ID Comments User Context   12/17/2015 11:01 PM 12/22/2015 10:27 PM Full Code [id number]  Etta Quill, DO ED   08/04/2014  8:39 PM 08/11/2014  7:05 PM DNR [id number]  Lavina Hamman, MD ED   03/01/2012  8:43 PM 03/04/2012  4:18 PM Full Code [id number]  Martie Round, RN Inpatient    Advance Directive Documentation   Flowsheet Row Most Recent Value  Type of Advance Directive  Out of facility DNR (pink MOST or yellow form)  Pre-existing out of facility DNR order (yellow form or pink MOST form)  No data  "MOST" Form in Place?  No data       Chief Complaint  Patient presents with  . Discharge Note    HISTORY OF PRESENT ILLNESS:  This is a 82 year old female who is for discharge with Home health PT, OT, CNA and Nursing.  She has been admitted to [facility] on 12/22/15 from [facility]. She has PMH of mechanical MVR 2001, chronic afib on chronic Coumadin anticoagulation, chronic venous LE edema and hypertension. She had a mechanical fall @ home and she sustained a left hip femoral neck fracture. She had left hemiarthroplasty on 12/18/15.  Patient was admitted to this facility for short-term rehabilitation after the patient's recent hospitalization.  Patient has completed SNF rehabilitation and therapy has cleared the patient for discharge.  PAST MEDICAL HISTORY:  Past Medical History:  Diagnosis Date  . Allergic rhinitis   . Atrial  fibrillation ([place])   . Basal cell carcinoma   . Bilateral lower extremity edema    history of venous insufficiency  . Bullous pemphigoid   . CHF (congestive heart failure) ([place])   . Chronic anemia    not applicable since [id number] when pt had hysterectomy   . Chronic anticoagulation   . Coronary artery disease   . DVT (deep venous thrombosis) ([place])    possible in 1990s and does not remember being on blood thinner  . Glaucoma   . H/O mitral valve replacement 2001   mechanical heart valve; family states indication may have been MR from MVP or  rheumatic heart disease  . Hypertension   . Hypothyroidism   . Ischemic bowel disease ([place])    missing 2/3 of bowel  . S/p nephrectomy    left  . Shortness of breath dyspnea   . Thyroid disease   . Urinary incontinence      CURRENT MEDICATIONS: Reviewed  Patient's Medications  New Prescriptions   No medications on file  Previous Medications   ACETAMINOPHEN (TYLENOL) 325 MG TABLET    Take 325 mg by mouth daily. QAM   ACETAMINOPHEN (TYLENOL) 650 MG CR TABLET    Take 650 mg by mouth every 4 (four) hours as needed for pain.   ATENOLOL (TENORMIN) 25 MG TABLET    Take 50 mg by mouth daily.  CALCIUM-VITAMIN D (OSCAL WITH D) 500-200 MG-UNIT PER TABLET    Take 1 tablet by mouth every evening.    CHOLECALCIFEROL 2000 UNITS CAPS    Take 1 capsule by mouth every evening.   CLOBETASOL OINTMENT (TEMOVATE) 0.05 %    Apply 1 application topically as needed (blisters for bulous pemphigoid).    DIGOXIN (LANOXIN) 0.125 MG TABLET    Take 0.125 mg by mouth daily.    FUROSEMIDE (LASIX) 40 MG TABLET    Take 1 tablet (40 mg total) by mouth daily.   KLOR-CON M20 20 MEQ TABLET    Take 1 tablet by mouth daily.    LATANOPROST (XALATAN) 0.005 % OPHTHALMIC SOLUTION    Place 1 drop into both eyes at bedtime.    LEVOTHYROXINE (SYNTHROID, LEVOTHROID) 50 MCG TABLET    Take 50 mcg by mouth daily.   LISINOPRIL (PRINIVIL,ZESTRIL) 10 MG TABLET    Take 10 mg by mouth daily.     LORAZEPAM (ATIVAN) 0.5 MG TABLET    Take 0.5 mg by mouth 2 (two) times daily as needed for anxiety.   MELATONIN 3 MG TABS    Take 1 tablet by mouth at bedtime as needed.   MULTIPLE VITAMIN (MULTIVITAMIN WITH MINERALS) TABS    Take 1 tablet by mouth every evening.    OMEPRAZOLE (PRILOSEC) 20 MG CAPSULE    Take 20 mg by mouth daily.    SENNA-DOCUSATE (SENOKOT-S) 8.6-50 MG TABLET    Take 1 tablet by mouth 2 (two) times daily.  Modified Medications   No medications on file  Discontinued Medications   CHOLECALCIFEROL (VITAMIN D-3) 1000 UNITS CAPS    Take 2 capsules by mouth every evening.      Allergies  Allergen Reactions  . Heparin Anaphylaxis     REVIEW OF SYSTEMS:  GENERAL: no change in appetite, no fatigue, no weight changes, no fever, chills or weakness EYES: Denies change in vision, dry eyes, eye pain, itching or discharge EARS: Denies change in hearing, ringing in ears, or earache NOSE: Denies nasal congestion or epistaxis MOUTH and THROAT: Denies oral discomfort, gingival pain or bleeding, pain from teeth or hoarseness   RESPIRATORY: no cough, SOB, DOE, wheezing, hemoptysis CARDIAC: no chest pain,  or palpitations, +edema GI: no abdominal pain, diarrhea, constipation, heart burn, nausea or vomiting GU: Denies dysuria, frequency, hematuria, incontinence, or discharge PSYCHIATRIC: Denies feeling of depression or anxiety. No report of hallucinations, insomnia, paranoia, or agitation    PHYSICAL EXAMINATION  GENERAL APPEARANCE: Well nourished. In no acute distress. Normal body habitus SKIN:  Left femur surgical incision is dry and no srythema; she has multiple open blisters on her bilateral upper thighs HEAD: Normal in size and contour. No evidence of trauma EYES: Lids open and close normally. No blepharitis, entropion or ectropion. PERRL. Conjunctivae are clear and sclerae are white. Lenses are without opacity EARS: Pinnae are normal. Patient hears normal voice tunes of the  examiner MOUTH and THROAT: Lips are without lesions. Oral mucosa is moist and without lesions. Tongue is normal in shape, size, and color and without lesions NECK: supple, trachea midline, no neck masses, no thyroid tenderness, no thyromegaly LYMPHATICS: no LAN in the neck, no supraclavicular LAN RESPIRATORY: breathing is even & unlabored, BS CTAB CARDIAC: RRR, no murmur,no extra heart sounds, BLE 2+ edema GI: abdomen soft, normal BS, no masses, no tenderness, no hepatomegaly, no splenomegaly EXTREMITIES:  Able to move X 4 extremities; BLE has generalized weakness PSYCHIATRIC: Alert and oriented X 3.  Affect and behavior are appropriate  LABS/RADIOLOGY: Labs reviewed: Basic Metabolic Panel:  Recent Labs  12/19/15 0348 12/20/15 0258 12/21/15 0517 12/24/15 12/28/15  NA 140 137 138 140 138  K 3.9 3.8 3.9 4.5 3.8  CL 108 105 106  --   --   CO2 26 25 27   --   --   GLUCOSE 131* 129* 105*  --   --   BUN 17 26* 26* 27* 18  CREATININE 1.04* 1.22* 0.96 0.9 1.1  CALCIUM 8.2* 7.9* 7.9*  --   --    Liver Function Tests:  Recent Labs  12/17/15 2041  AST 45*  ALT 28  ALKPHOS 64  BILITOT 1.5*  PROT 7.0  ALBUMIN 3.8   CBC:  Recent Labs  12/17/15 2041 12/19/15 0348 12/20/15 0258 12/21/15 0517 12/28/15  WBC 7.8 11.1* 10.9* 9.5 9.0  NEUTROABS 6.6  --   --   --  6,300  HGB 12.8 9.2* 7.4* 9.1* 10.0*  HCT 36.4 27.5* 22.4* 26.9* 30*  MCV 89.2 91.7 92.6 89.4  --   PLT 119* 111* 85* 102* 342   Cardiac Enzymes:  Recent Labs  12/17/15 2041  TROPONINI <0.03   CBG:  Recent Labs  12/18/15 0431  GLUCAP 131*      Dg Chest 1 View  Result Date: 12/17/2015 CLINICAL DATA:  Status post fall.  History of AFib and CHF. EXAM: CHEST 1 VIEW COMPARISON:  Chest radiograph 08/10/2014 and before FINDINGS: The heart remains massively enlarged. There is right retrocardiac consolidation. Previously seen pleural effusion on the right has resolved. No pneumothorax. IMPRESSION: 1. Massive  cardiomegaly, unchanged. 2. Probable superimposed right lower lobe consolidation. This could indicate developing infection or atelectasis. Electronically Signed   By: Ulyses Jarred M.D.   On: 12/17/2015 20:52   Pelvis Portable  Result Date: 12/18/2015 CLINICAL DATA:  Post left hip hemiarthroplasty EXAM: PORTABLE PELVIS 1-2 VIEWS COMPARISON:  CT urogram 03/29/2005 FINDINGS: Postoperative left hip hemiarthroplasty using cemented femoral component. Components appear well seated. No evidence of acute fracture or dislocation. Skin clips and subcutaneous soft tissue gas consistent with recent surgery. IMPRESSION: Postoperative left hip hemiarthroplasty. Component appears well seated. Electronically Signed   By: Lucienne Capers M.D.   On: 12/18/2015 19:14   Dg Hip Unilat W Or Wo Pelvis 2-3 Views Left  Result Date: 12/17/2015 CLINICAL DATA:  Fall with hip pain.  LEFT hip pain EXAM: DG HIP (WITH OR WITHOUT PELVIS) 2-3V LEFT COMPARISON:  None. FINDINGS: Subcapital fracture of the LEFT femoral neck with varus angulation. There is mild migration of the femur superiorly. IMPRESSION: Subcapital LEFT femoral neck fracture Electronically Signed   By: Suzy Bouchard M.D.   On: 12/17/2015 20:50    ASSESSMENT/PLAN:  Unsteady gait - for Home health PT, CNA, Nursing and OT, for therapeutic strengthening exercises; fall precaution  Left hip fracture S/P hemiarthroplasty - for Home health CNA, Nursing, PT and OT, for therapeutic strengthening exercises; Norco was discontinued since it was making her confused, continue Tylenol 325 mg 2 tabs = 650 mg PO Q 6 hours PRN for pain; Coumadin 3 mg PO daily for DVT prophylaxis; follow-up with orthopedic surgeon, Dr. Tania Ade  Chronic atrial fibrillation - rate-controlled; continue Atenolol 50 mg 1 tab daily, recently increased Digoxin to 0.125 mg 1 tab PO po daily; check Digoxin level in 1 week.  History of Mechanical MVR - continue Coumadin; re-check INR on  [id number]  Chronic diastolic CHF - no SOB; continue Digoxin 0.0625 mg  daily, Lisinopril 10 mg 1 tab daily and Lasix 40 mg daily; weigh daily  Hypokalemia - continue [place] con 20 meq 1 tab daily Lab Results  Component Value Date   K 3.8 12/28/2015   Hypothyroidism - continue Synthroid 50 mcg daily; check tsh Lab Results  Component Value Date   TSH 3.56 12/24/2015    Bullous pemphigoid - continue to apply Clobetasol ointment 0.05% topically daily to blisters on bilateral upper thighs  Anemia, acute blood loss - stable Lab Results  Component Value Date   HGB 10.0 (A) 12/28/2015   Constipation - continue Senokot - S 1 tab PO BID  Long-term use of anticoagulant - INR 2.9; continue Coumadin 3 mg daily and check INR on 01/14/16      I have filled out patient's discharge paperwork and written prescriptions.  Patient will receive home health PT, OTNursing and CNA.  DME provided:  None  Total discharge time: Less than 30 minutes  Discharge time involved coordination of the discharge process with social worker, nursing staff and therapy department. Medical justification for home health services verified.      Durenda Age, NP [employer] [phone]

## 2016-01-13 DIAGNOSIS — R1312 Dysphagia, oropharyngeal phase: Secondary | ICD-10-CM | POA: Diagnosis not present

## 2016-01-13 DIAGNOSIS — I5032 Chronic diastolic (congestive) heart failure: Secondary | ICD-10-CM | POA: Diagnosis not present

## 2016-01-13 DIAGNOSIS — I110 Hypertensive heart disease with heart failure: Secondary | ICD-10-CM | POA: Diagnosis not present

## 2016-01-13 DIAGNOSIS — S72142D Displaced intertrochanteric fracture of left femur, subsequent encounter for closed fracture with routine healing: Secondary | ICD-10-CM

## 2016-07-13 ENCOUNTER — Inpatient Hospital Stay (HOSPITAL_COMMUNITY)
Admission: EM | Admit: 2016-07-13 | Discharge: 2016-07-26 | DRG: 605 | Disposition: A | Discharge status: Skilled Nursing Facility | Payer: Medicare Other | Attending: Internal Medicine | Admitting: Internal Medicine

## 2016-07-13 ENCOUNTER — Encounter (HOSPITAL_COMMUNITY): Payer: Self-pay | Admitting: *Deleted

## 2016-07-13 ENCOUNTER — Emergency Department (HOSPITAL_COMMUNITY): Payer: Medicare Other

## 2016-07-13 DIAGNOSIS — I482 Chronic atrial fibrillation, unspecified: Secondary | ICD-10-CM | POA: Diagnosis present

## 2016-07-13 DIAGNOSIS — I10 Essential (primary) hypertension: Secondary | ICD-10-CM | POA: Diagnosis present

## 2016-07-13 DIAGNOSIS — S7002XA Contusion of left hip, initial encounter: Principal | ICD-10-CM | POA: Diagnosis present

## 2016-07-13 DIAGNOSIS — I5032 Chronic diastolic (congestive) heart failure: Secondary | ICD-10-CM

## 2016-07-13 DIAGNOSIS — R197 Diarrhea, unspecified: Secondary | ICD-10-CM | POA: Diagnosis present

## 2016-07-13 DIAGNOSIS — Z79899 Other long term (current) drug therapy: Secondary | ICD-10-CM

## 2016-07-13 DIAGNOSIS — L120 Bullous pemphigoid: Secondary | ICD-10-CM | POA: Diagnosis not present

## 2016-07-13 DIAGNOSIS — H409 Unspecified glaucoma: Secondary | ICD-10-CM | POA: Diagnosis present

## 2016-07-13 DIAGNOSIS — R040 Epistaxis: Secondary | ICD-10-CM | POA: Diagnosis not present

## 2016-07-13 DIAGNOSIS — Z9049 Acquired absence of other specified parts of digestive tract: Secondary | ICD-10-CM

## 2016-07-13 DIAGNOSIS — Z8673 Personal history of transient ischemic attack (TIA), and cerebral infarction without residual deficits: Secondary | ICD-10-CM

## 2016-07-13 DIAGNOSIS — Z823 Family history of stroke: Secondary | ICD-10-CM

## 2016-07-13 DIAGNOSIS — Y9301 Activity, walking, marching and hiking: Secondary | ICD-10-CM | POA: Diagnosis present

## 2016-07-13 DIAGNOSIS — Z7901 Long term (current) use of anticoagulants: Secondary | ICD-10-CM | POA: Diagnosis not present

## 2016-07-13 DIAGNOSIS — D649 Anemia, unspecified: Secondary | ICD-10-CM

## 2016-07-13 DIAGNOSIS — S7012XA Contusion of left thigh, initial encounter: Secondary | ICD-10-CM

## 2016-07-13 DIAGNOSIS — Z905 Acquired absence of kidney: Secondary | ICD-10-CM

## 2016-07-13 DIAGNOSIS — Z951 Presence of aortocoronary bypass graft: Secondary | ICD-10-CM

## 2016-07-13 DIAGNOSIS — I4821 Permanent atrial fibrillation: Secondary | ICD-10-CM

## 2016-07-13 DIAGNOSIS — D696 Thrombocytopenia, unspecified: Secondary | ICD-10-CM

## 2016-07-13 DIAGNOSIS — W19XXXA Unspecified fall, initial encounter: Secondary | ICD-10-CM

## 2016-07-13 DIAGNOSIS — M25552 Pain in left hip: Secondary | ICD-10-CM

## 2016-07-13 DIAGNOSIS — E039 Hypothyroidism, unspecified: Secondary | ICD-10-CM | POA: Diagnosis present

## 2016-07-13 DIAGNOSIS — R262 Difficulty in walking, not elsewhere classified: Secondary | ICD-10-CM

## 2016-07-13 DIAGNOSIS — E038 Other specified hypothyroidism: Secondary | ICD-10-CM | POA: Diagnosis not present

## 2016-07-13 DIAGNOSIS — Z85828 Personal history of other malignant neoplasm of skin: Secondary | ICD-10-CM

## 2016-07-13 DIAGNOSIS — I110 Hypertensive heart disease with heart failure: Secondary | ICD-10-CM | POA: Diagnosis present

## 2016-07-13 DIAGNOSIS — Y92090 Kitchen in other non-institutional residence as the place of occurrence of the external cause: Secondary | ICD-10-CM

## 2016-07-13 DIAGNOSIS — L129 Pemphigoid, unspecified: Secondary | ICD-10-CM | POA: Diagnosis present

## 2016-07-13 DIAGNOSIS — Z90710 Acquired absence of both cervix and uterus: Secondary | ICD-10-CM

## 2016-07-13 DIAGNOSIS — I2510 Atherosclerotic heart disease of native coronary artery without angina pectoris: Secondary | ICD-10-CM | POA: Diagnosis present

## 2016-07-13 DIAGNOSIS — W010XXA Fall on same level from slipping, tripping and stumbling without subsequent striking against object, initial encounter: Secondary | ICD-10-CM | POA: Diagnosis present

## 2016-07-13 DIAGNOSIS — I872 Venous insufficiency (chronic) (peripheral): Secondary | ICD-10-CM

## 2016-07-13 DIAGNOSIS — Z888 Allergy status to other drugs, medicaments and biological substances status: Secondary | ICD-10-CM

## 2016-07-13 DIAGNOSIS — Z952 Presence of prosthetic heart valve: Secondary | ICD-10-CM

## 2016-07-13 DIAGNOSIS — Z8249 Family history of ischemic heart disease and other diseases of the circulatory system: Secondary | ICD-10-CM

## 2016-07-13 DIAGNOSIS — Z96642 Presence of left artificial hip joint: Secondary | ICD-10-CM | POA: Diagnosis present

## 2016-07-13 HISTORY — DX: Anemia, unspecified: D64.9

## 2016-07-13 HISTORY — DX: Thrombocytopenia, unspecified: D69.6

## 2016-07-13 HISTORY — DX: Chronic atrial fibrillation, unspecified: I48.20

## 2016-07-13 HISTORY — DX: Chronic diastolic (congestive) heart failure: I50.32

## 2016-07-13 HISTORY — DX: Cerebral infarction, unspecified: I63.9

## 2016-07-13 LAB — URINALYSIS, ROUTINE W REFLEX MICROSCOPIC
BILIRUBIN URINE: NEGATIVE
Bacteria, UA: NONE SEEN
GLUCOSE, UA: NEGATIVE mg/dL
KETONES UR: NEGATIVE mg/dL
LEUKOCYTES UA: NEGATIVE
NITRITE: NEGATIVE
PH: 7 (ref 5.0–8.0)
Protein, ur: NEGATIVE mg/dL
Specific Gravity, Urine: 1.011 (ref 1.005–1.030)

## 2016-07-13 LAB — CK: Total CK: 77 U/L (ref 38–234)

## 2016-07-13 LAB — CBC WITH DIFFERENTIAL/PLATELET
Basophils Absolute: 0 10*3/uL (ref 0.0–0.1)
Basophils Relative: 1 %
Eosinophils Absolute: 0.1 10*3/uL (ref 0.0–0.7)
Eosinophils Relative: 1 %
HCT: 32.9 % — ABNORMAL LOW (ref 36.0–46.0)
Hemoglobin: 10.9 g/dL — ABNORMAL LOW (ref 12.0–15.0)
Lymphocytes Relative: 20 %
Lymphs Abs: 1.1 10*3/uL (ref 0.7–4.0)
MCH: 31.3 pg (ref 26.0–34.0)
MCHC: 33.1 g/dL (ref 30.0–36.0)
MCV: 94.5 fL (ref 78.0–100.0)
Monocytes Absolute: 0.6 10*3/uL (ref 0.1–1.0)
Monocytes Relative: 10 %
Neutro Abs: 3.8 10*3/uL (ref 1.7–7.7)
Neutrophils Relative %: 68 %
Platelets: 130 10*3/uL — ABNORMAL LOW (ref 150–400)
RBC: 3.48 MIL/uL — ABNORMAL LOW (ref 3.87–5.11)
RDW: 14.9 % (ref 11.5–15.5)
WBC: 5.5 10*3/uL (ref 4.0–10.5)

## 2016-07-13 LAB — CBC
HCT: 31.8 % — ABNORMAL LOW (ref 36.0–46.0)
Hemoglobin: 10.5 g/dL — ABNORMAL LOW (ref 12.0–15.0)
MCH: 31.2 pg (ref 26.0–34.0)
MCHC: 33 g/dL (ref 30.0–36.0)
MCV: 94.4 fL (ref 78.0–100.0)
Platelets: 125 10*3/uL — ABNORMAL LOW (ref 150–400)
RBC: 3.37 MIL/uL — ABNORMAL LOW (ref 3.87–5.11)
RDW: 14.7 % (ref 11.5–15.5)
WBC: 6.2 10*3/uL (ref 4.0–10.5)

## 2016-07-13 LAB — BASIC METABOLIC PANEL
Anion gap: 7 (ref 5–15)
BUN: 19 mg/dL (ref 6–20)
CHLORIDE: 107 mmol/L (ref 101–111)
CO2: 25 mmol/L (ref 22–32)
Calcium: 9.1 mg/dL (ref 8.9–10.3)
Creatinine, Ser: 0.92 mg/dL (ref 0.44–1.00)
GFR calc Af Amer: >60 mL/min (ref >60)
GFR, EST NON AFRICAN AMERICAN: 53 mL/min — AB (ref >60)
Glucose, Bld: 107 mg/dL — ABNORMAL HIGH (ref 65–99)
POTASSIUM: 3.7 mmol/L (ref 3.5–5.1)
SODIUM: 139 mmol/L (ref 135–145)

## 2016-07-13 LAB — I-STAT TROPONIN, ED: Troponin i, poc: 0.02 ng/mL (ref 0.00–0.08)

## 2016-07-13 LAB — PROTIME-INR
INR: 1.83
PROTHROMBIN TIME: 21.4 s — AB (ref 11.4–15.2)

## 2016-07-13 MED ORDER — FENTANYL CITRATE (PF) 100 MCG/2ML IJ SOLN
25.0000 ug | Freq: Once | INTRAMUSCULAR | Status: AC
Start: 2016-07-13 — End: 2016-07-13
  Administered 2016-07-13: 25 ug via INTRAVENOUS
  Filled 2016-07-13: qty 2

## 2016-07-13 NOTE — ED Triage Notes (Signed)
Pt from home, reports she was walking with her bedroom shoes on, states she thinks that her shoes got caught on something but is unsure.  Denies feeling dizzy prior to the fall.  Pt reports L hip replacement in August of last year.  Pt denies any pain at this time.  Tolerated transfer from EMS stretcher to the ED stretcher very well.  Pt is A&O x 4.

## 2016-07-13 NOTE — ED Provider Notes (Signed)
[facility] DEPT Provider Note   CSN: [id number] Arrival date & time: 07/13/16  1556     History   Chief Complaint Chief Complaint  Patient presents with  . Fall  . Hip Pain    HPI KYARA BOXER is a 83 y.o. female.  HPI   83 year old female with extensive history as below including known atrial fibrillation on Coumadin and recent left hip fracture in September who presents with mechanical fall. Patient lives alone. She was walking in her house earlier today when she states that she tripped and fell onto her left hip. She was unable to get up and was reportedly found down on the ground. She was down for approximately 3-4 hours according to family. She was unable to walk since then. She normally walks with a cane and is otherwise fairly independent. Currently, she endorses an aching, throbbing, left hip pain with internal rotation of her hip. Denies any numbness or weakness. Denies any head injury. She states she did not lose consciousness.  Past Medical History:  Diagnosis Date  . Allergic rhinitis   . Atrial fibrillation ([place])   . Basal cell carcinoma   . Bilateral lower extremity edema    history of venous insufficiency  . Bullous pemphigoid   . CHF (congestive heart failure) ([place])   . Chronic anemia    not applicable since [id number] when pt had hysterectomy   . Chronic anticoagulation   . Coronary artery disease   . DVT (deep venous thrombosis) ([place])    possible in 1990s and does not remember being on blood thinner  . Glaucoma   . H/O mitral valve replacement 2001   mechanical heart valve; family states indication may have been MR from MVP or  rheumatic heart disease  . Hypertension   . Hypothyroidism   . Ischemic bowel disease ([place])    missing 2/3 of bowel  . S/p nephrectomy    left  . Shortness of breath dyspnea   . Thyroid disease   . Urinary incontinence     Patient Active Problem List   Diagnosis Date Noted  . Hematoma of left hip 07/13/2016  .  Closed left hip fracture ([place])   . Left displaced femoral neck fracture ([place]) 12/17/2015  . Transaminitis 08/11/2014  . CAP (community acquired pneumonia) 08/04/2014  . Acute on chronic diastolic heart failure ([place]) 08/04/2014  . Subtherapeutic anticoagulation 08/04/2014  . Edema extremities- chronic. Seen by Dr Debara Pickett 10/13 11/13/2012  . Cellulitis 03/01/2012  . Fever 03/01/2012  . H/O mechanical mitral valve replacement 2001   . Thyroid disease   . Hypertension   . CHF (congestive heart failure) ([place])   . S/p nephrectomy   . Coronary artery disease   . H/O coronary artery bypass surgery   . Ischemic bowel disease ([place])   . Urinary incontinence   . Chronic atrial fibrillation ([place])   . DVT (deep venous thrombosis) ([place])   . Hypothyroidism   . Chronic anemia   . Glaucoma   . Bullous pemphigoid   . Chronic anticoagulation   . Basal cell carcinoma   . Allergic rhinitis     Past Surgical History:  Procedure Laterality Date  . ABDOMINAL HYSTERECTOMY  1960s  . APPENDECTOMY  1960s  . CORONARY ARTERY BYPASS GRAFT  01/2000  . HERNIA REPAIR    . HIP ARTHROPLASTY Left 12/18/2015   Procedure: HEMIARTHROPLASTY  HIP;  Surgeon: Tania Ade, MD;  Location: [facility];  Service: Orthopedics;  Laterality: Left;  .  hx broken collarbone    . LAPAROSCOPIC SMALL BOWEL RESECTION  2004 or 5   adhesions and ischemic bowel  . MITRAL VALVE REPLACEMENT  01/2000   mechanical heart valve  . TONSILLECTOMY  1933  . TRANSTHORACIC ECHOCARDIOGRAM  02/2012   EF 60-65%, mild LVH; mildly thickened AV leaflets with sclerosis and moderate regurg; mechanical MV prosthesis; LA dilated; RVSP increased; RA severely dilated; increased LA pressure; moderate TR    OB History    No data available       Home Medications    Prior to Admission medications   Medication Sig Start Date End Date Taking? Authorizing Provider  atenolol (TENORMIN) 50 MG tablet Take 50 mg by mouth daily.   Yes Historical Provider, MD    calcium-vitamin D (OSCAL WITH D) 500-200 MG-UNIT per tablet Take 1 tablet by mouth every evening.    Yes Historical Provider, MD  Cholecalciferol 2000 units CAPS Take 1 capsule by mouth every evening.   Yes Historical Provider, MD  clobetasol ointment (TEMOVATE) 3.33 % Apply 1 application topically 2 (two) times daily as needed (blisters for bulous pemphigoid).    Yes Historical Provider, MD  digoxin (LANOXIN) 0.125 MG tablet Take 0.0625 mg by mouth daily.    Yes Historical Provider, MD  furosemide (LASIX) 40 MG tablet Take 1 tablet (40 mg total) by mouth daily. 08/11/14  Yes Mutaz Elmahi, MD  KLOR-CON M20 20 MEQ tablet Take 1 tablet by mouth daily.  08/22/14  Yes Historical Provider, MD  latanoprost (XALATAN) 0.005 % ophthalmic solution Place 1 drop into both eyes at bedtime.    Yes Historical Provider, MD  levothyroxine (SYNTHROID, LEVOTHROID) 50 MCG tablet Take 50 mcg by mouth daily. 05/05/14  Yes Historical Provider, MD  lisinopril (PRINIVIL,ZESTRIL) 10 MG tablet Take 10 mg by mouth daily.  03/27/14  Yes Historical Provider, MD  Multiple Vitamin (MULTIVITAMIN WITH MINERALS) TABS Take 1 tablet by mouth every evening.    Yes Historical Provider, MD  niacinamide 500 MG tablet Take 500 mg by mouth 2 (two) times daily with a meal.   Yes Historical Provider, MD  Probiotic Product (PROBIOTIC PO) Take 1 capsule by mouth every other day.   Yes Historical Provider, MD  warfarin (COUMADIN) 3 MG tablet Take 1.5-3 mg by mouth See admin instructions. Take 1/2 tablet on Monday then take 1 tablet all the other days   Yes Historical Provider, MD  senna-docusate (SENOKOT-S) 8.6-50 MG tablet Take 1 tablet by mouth 2 (two) times daily. Patient not taking: Reported on 07/13/2016 12/21/15   Domenic Polite, MD    Family History Family History  Problem Relation Age of Onset  . Irregular heart beat Mother   . Other Mother     pellegra  . Heart failure Mother     had very swollen legs??  . Stroke Mother   . Prostate  cancer Father     also skin cancer  . Heart attack Daughter   . Heart disease Brother   . Heart attack Brother 59    still alive    Social History Social History  Substance Use Topics  . Smoking status: Never Smoker  . Smokeless tobacco: Never Used  . Alcohol use No     Allergies   Heparin and Codeine   Review of Systems Review of Systems  Constitutional: Positive for fatigue. Negative for chills and fever.  HENT: Negative for congestion and rhinorrhea.   Eyes: Negative for visual disturbance.  Respiratory: Negative for cough,  shortness of breath and wheezing.   Cardiovascular: Negative for chest pain and leg swelling.  Gastrointestinal: Negative for abdominal pain, diarrhea, nausea and vomiting.  Genitourinary: Negative for dysuria and flank pain.  Musculoskeletal: Positive for arthralgias, gait problem and joint swelling. Negative for neck pain and neck stiffness.  Skin: Negative for rash and wound.  Allergic/Immunologic: Negative for immunocompromised state.  Neurological: Negative for syncope, weakness and headaches.  All other systems reviewed and are negative.    Physical Exam Updated Vital Signs BP (!) 155/62   Pulse 99   Temp 99.5 F (37.5 C) (Oral)   Resp (!) 24   SpO2 96%   Physical Exam  Constitutional: She is oriented to person, place, and time. She appears well-developed and well-nourished. No distress.  HENT:  Head: Normocephalic and atraumatic.  Eyes: Conjunctivae are normal.  Neck: Neck supple.  Cardiovascular: Normal rate, regular rhythm and normal heart sounds.  Exam reveals no friction rub.   No murmur heard. Pulmonary/Chest: Effort normal and breath sounds normal. No respiratory distress. She has no wheezes. She has no rales.  Abdominal: She exhibits no distension.  Musculoskeletal: She exhibits no edema.  Neurological: She is alert and oriented to person, place, and time. She exhibits normal muscle tone.  Skin: Skin is warm. Capillary  refill takes less than 2 seconds.  Psychiatric: She has a normal mood and affect.  Nursing note and vitals reviewed.   LOWER EXTREMITY EXAM: LEFT  INSPECTION & PALPATION: Large hematoma to left lateral thigh/hip with marked TTP. Mild TTP with internal/external rotation of hip. Leg rotated inward but not shortened.  SENSORY: sensation is intact to light touch in:  Superficial peroneal nerve distribution (over dorsum of foot) Deep peroneal nerve distribution (over first dorsal web space) Sural nerve distribution (over lateral aspect 5th metatarsal) Saphenous nerve distribution (over medial instep)  MOTOR:  + Motor EHL (great toe dorsiflexion) + FHL (great toe plantar flexion)  + TA (ankle dorsiflexion)  + GSC (ankle plantar flexion)  VASCULAR: 2+ dorsalis pedis and posterior tibialis pulses Capillary refill < 2 sec, toes warm and well-perfused  COMPARTMENTS: Soft, warm, well-perfused No pain with passive extension No parethesias    ED Treatments / Results  Labs (all labs ordered are listed, but only abnormal results are displayed) Labs Reviewed  CBC WITH DIFFERENTIAL/PLATELET - Abnormal; Notable for the following:       Result Value   RBC 3.48 (*)    Hemoglobin 10.9 (*)    HCT 32.9 (*)    Platelets 130 (*)    All other components within normal limits  BASIC METABOLIC PANEL - Abnormal; Notable for the following:    Glucose, Bld 107 (*)    GFR calc non Af Amer 53 (*)    All other components within normal limits  URINALYSIS, ROUTINE W REFLEX MICROSCOPIC - Abnormal; Notable for the following:    Hgb urine dipstick MODERATE (*)    Squamous Epithelial / LPF 0-5 (*)    All other components within normal limits  CBC - Abnormal; Notable for the following:    RBC 3.37 (*)    Hemoglobin 10.5 (*)    HCT 31.8 (*)    Platelets 125 (*)    All other components within normal limits  PROTIME-INR - Abnormal; Notable for the following:    Prothrombin Time 21.4 (*)    All other  components within normal limits  CK  I-STAT TROPOININ, ED    EKG  EKG Interpretation None  Radiology Ct Head Wo Contrast  Result Date: 07/13/2016 CLINICAL DATA:  Dizziness prior to a fall today at home. EXAM: CT HEAD WITHOUT CONTRAST TECHNIQUE: Contiguous axial images were obtained from the base of the skull through the vertex without intravenous contrast. COMPARISON:  08/08/2014. 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 Mildly progressive diffuse enlargement of the ventricles and subarachnoid spaces. Patchy white matter low density in both cerebral hemispheres. Old right caudate head lacunar infarct. No intracranial hemorrhage, mass lesion or CT evidence of acute infarction. Vascular: No hyperdense vessel or unexpected calcification. Skull: Normal. Negative for fracture or focal lesion. Sinuses/Orbits: No acute finding. Other: None. IMPRESSION: 1. No acute abnormality. 2. 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 3. Stable old right caudate head lacunar infarct. 4. Mildly progressive atrophy and chronic small vessel white matter ischemic changes in both cerebral hemispheres. Electronically Signed   By: Claudie Revering M.D.   On: 07/13/2016 17:09   Dg Hip Unilat With Pelvis 2-3 Views Left  Result Date: 07/13/2016 CLINICAL DATA:  Left hip pain following a fall today. EXAM: DG HIP (WITH OR WITHOUT PELVIS) 2-3V LEFT COMPARISON:  12/17/2015. FINDINGS: Interval left femoral head prosthesis. No fracture or dislocation seen. Diffuse osteopenia. IMPRESSION: No fracture or dislocation. Electronically Signed   By: Claudie Revering M.D.   On: 07/13/2016 17:05    Procedures Procedures (including critical care time)  Medications Ordered in ED Medications  fentaNYL (SUBLIMAZE) injection 25 mcg (25 mcg Intravenous Given 07/13/16 2152)     Initial Impression / Assessment and Plan / ED Course  I  have reviewed the triage vital signs and the nursing notes.  Pertinent labs & imaging results that were available during my care of the patient were reviewed by me and considered in my medical decision making (see chart for details).     83 year old with extensive past medical history as above who presents with left hip pain status post mechanical fall. On exam, she has a large, enlarging hematoma to the left thigh with associated tenderness to palpation. The left leg is internally rotated as well. Plain films show no fx but pt has persistent TTP, inability to walk 2/2 this pain. Given large hematoma, concern for occult fx, inability to walk and care for self, will admit for PT/OT, further evaluation. Family in agreement. Skin overlying hematoma intact ,not compromised at this time.  Final Clinical Impressions(s) / ED Diagnoses   Final diagnoses:  Fall, initial encounter  Hematoma of left thigh, initial encounter  Difficulty walking    New Prescriptions New Prescriptions   No medications on file     Duffy Bruce, MD 07/14/16 [phone]

## 2016-07-13 NOTE — ED Notes (Signed)
Patient ambulated in hallway.  Patient demonstrated a very slow and unbalanced gait. Patient is a two person minimum assist.  Patient stated that she normally can ambulated with cane at home.

## 2016-07-13 NOTE — H&P (Addendum)
History and Physical  Amanda Richardson SNK:[id number] DOB: [birth year] DOA: 07/13/2016   PCP: Merrilee Seashore, MD   Patient coming from: Home  Chief Complaint: left hip pain and hematoma  HPI:  Amanda Richardson is a 83 y.o. female with medical history of atrial fibrillation, bullous pemphigoid, hypertension, hypothyroidism presenting after a mechanical fall on the morning of 07/13/2016. The patient had unwitnessed fall while she was walking across the done following up on her left side. There was no loss of consciousness. The patient denied any prodromal symptoms of chest discomfort, short of breath, dizziness. The patient did not have any significant pain, but was too weak to get up. She laid on the floor for approximately 2-3 hours prior to her daughter coming back to checkup on her. EMS was activated secondary to the patient's weakness and inability to get up.  Patient denies fevers, chills, headache, chest pain, dyspnea, nausea, vomiting, diarrhea, abdominal pain, dysuria, hematuria, hematochezia, and melena. The patient denies frequent falls. She has not fallen since Aug 2017 during which time she had a left hip fracture requiring left hemiarthroplasty  In the emergency department, the patient was afebrile hemodynamically stable and saturating well on room air. X-ray of the left hip was negative for dislocation or fracture. CT of the brain was negative for acute findings. BMP and CBC were essentially unremarkable except for platelets of 125,000. CPK was 77. Urinalysis was negative for pyuria. INR was 1.83. EKG showed atrial fibrillation with nonspecific ST-T wave changes.  Assessment/Plan: Left hip hematoma/leg weakness -Patient normally able to ambulate with cane; now requiring 2 person assist -xray left hip neg for fracture -MRI left hip r/o occult fracture -Trend hemoglobin due to hematoma -Hold Coumadin and monitor INR and hematoma -consult ortho if Hgb drops and/or hematoma  enlarges -baseline Hgb ~10  History of mechanical mitral valve replacement 2001 -Holding Coumadin -Start patient on IV bivalirudin (heparin allergy) and monitor size of hematoma and hemoglobin  Chronic atrial fibrillation -Mild elevation of heart rate secondary to pain and not taken her meds on 3/22 -Continue digoxin and atenolol -Anticoagulation with IV bivalirudin (heparin allergy) for now as bridge  Hypertension -Continue atenolol and lisinopril  Chronic lower extremity edema -Venous duplex left lower extremity due to worsening left lower extremity edema -Continue compression stockings  Bullous pemphigoid -Continue Temovate -Continue niacinamide  Hypothyroidism -Continue Synthroid  Thrombocytopenia -Appears to be chronic dating back to August 2017 -Monitor CBC       Past Medical History:  Diagnosis Date  . Allergic rhinitis   . Atrial fibrillation ([place])   . Basal cell carcinoma   . Bilateral lower extremity edema    history of venous insufficiency  . Bullous pemphigoid   . CHF (congestive heart failure) ([place])   . Chronic anemia    not applicable since PFX-90W when pt had hysterectomy   . Chronic anticoagulation   . Coronary artery disease   . DVT (deep venous thrombosis) ([place])    possible in 1990s and does not remember being on blood thinner  . Glaucoma   . H/O mitral valve replacement 2001   mechanical heart valve; family states indication may have been MR from MVP or  rheumatic heart disease  . Hypertension   . Hypothyroidism   . 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 S/p nephrectomy    left  . Shortness of breath dyspnea   . Thyroid disease   . Urinary incontinence  Past Surgical History:  Procedure Laterality Date  . ABDOMINAL HYSTERECTOMY  1960s  . APPENDECTOMY  1960s  . CORONARY ARTERY BYPASS GRAFT  01/2000  . HERNIA REPAIR    . HIP ARTHROPLASTY Left 12/18/2015   Procedure: HEMIARTHROPLASTY  HIP;  Surgeon: Tania Ade, MD;  Location: [facility];  Service: Orthopedics;  Laterality: Left;  . hx broken collarbone    . LAPAROSCOPIC SMALL BOWEL RESECTION  2004 or 5   adhesions and ischemic bowel  . MITRAL VALVE REPLACEMENT  01/2000   mechanical heart valve  . TONSILLECTOMY  1933  . TRANSTHORACIC ECHOCARDIOGRAM  02/2012   EF 60-65%, mild LVH; mildly thickened AV leaflets with sclerosis and moderate regurg; mechanical MV prosthesis; LA dilated; RVSP increased; RA severely dilated; increased LA pressure; moderate TR   Social History:  reports that she has never smoked. She has never used smokeless tobacco. She reports that she does not drink alcohol or use drugs.   Family History  Problem Relation Age of Onset  . Irregular heart beat Mother   . Other Mother     pellegra  . Heart failure Mother     had very swollen legs??  . Stroke Mother   . Prostate cancer Father     also skin cancer  . Heart attack Daughter   . Heart disease Brother   . Heart attack Brother 74    still alive     Allergies  Allergen Reactions  . Heparin Anaphylaxis  . Codeine Other (See Comments)    Unknown      Prior to Admission medications   Medication Sig Start Date End Date Taking? Authorizing Provider  atenolol (TENORMIN) 50 MG tablet Take 50 mg by mouth daily.   Yes Historical Provider, MD  calcium-vitamin D (OSCAL WITH D) 500-200 MG-UNIT per tablet Take 1 tablet by mouth every evening.    Yes Historical Provider, MD  Cholecalciferol 2000 units CAPS Take 1 capsule by mouth every evening.   Yes Historical Provider, MD  clobetasol ointment (TEMOVATE) 8.41 % Apply 1 application topically 2 (two) times daily as needed (blisters for bulous pemphigoid).    Yes Historical Provider, MD  digoxin (LANOXIN) 0.125 MG tablet Take 0.0625 mg by mouth daily.    Yes Historical Provider, MD  furosemide (LASIX) 40 MG tablet Take 1 tablet (40 mg total) by mouth daily. 08/11/14  Yes Mutaz Elmahi, MD  KLOR-CON M20 20 MEQ tablet Take 1  tablet by mouth daily.  08/22/14  Yes Historical Provider, MD  latanoprost (XALATAN) 0.005 % ophthalmic solution Place 1 drop into both eyes at bedtime.    Yes Historical Provider, MD  levothyroxine (SYNTHROID, LEVOTHROID) 50 MCG tablet Take 50 mcg by mouth daily. 05/05/14  Yes Historical Provider, MD  lisinopril (PRINIVIL,ZESTRIL) 10 MG tablet Take 10 mg by mouth daily.  03/27/14  Yes Historical Provider, MD  Multiple Vitamin (MULTIVITAMIN WITH MINERALS) TABS Take 1 tablet by mouth every evening.    Yes Historical Provider, MD  niacinamide 500 MG tablet Take 500 mg by mouth 2 (two) times daily with a meal.   Yes Historical Provider, MD  Probiotic Product (PROBIOTIC PO) Take 1 capsule by mouth every other day.   Yes Historical Provider, MD  warfarin (COUMADIN) 3 MG tablet Take 1.5-3 mg by mouth See admin instructions. Take 1/2 tablet on Monday then take 1 tablet all the other days   Yes Historical Provider, MD  senna-docusate (SENOKOT-S) 8.6-50 MG tablet Take 1 tablet by mouth 2 (  two) times daily. Patient not taking: Reported on 07/13/2016 12/21/15   Domenic Polite, MD    Review of Systems:  Constitutional:  No weight loss, night sweats, Fevers, chills, fatigue.  Head&Eyes: No headache.  No vision loss.  No eye pain or scotoma ENT:  No Difficulty swallowing,Tooth/dental problems,Sore throat,  No ear ache, post nasal drip,  Cardio-vascular:  No chest pain, Orthopnea, PND, swelling in lower extremities,  dizziness, palpitations  GI:  No  abdominal pain, nausea, vomiting, diarrhea, loss of appetite, hematochezia, melena, heartburn, indigestion, Resp:  No shortness of breath with exertion or at rest. No cough. No coughing up of blood .No wheezing.No chest wall deformity  Skin:  no rash or lesions.  GU:  no dysuria, change in color of urine, no urgency or frequency. No flank pain.  Musculoskeletal:  Complains of mild left hip pain and leg weakness. No decreased range of motion. No back pain.    Psych:  No change in mood or affect. No depression or anxiety. Neurologic: No headache, no dysesthesia, no focal weakness, no vision loss. No syncope  Physical Exam: Vitals:   07/13/16 2115 07/13/16 2130 07/13/16 2145 07/13/16 2154  BP: (!) 154/83 (!) 152/68 (!) 155/62   Pulse: 94 (!) 47 99   Resp: 18 (!) 24 (!) 24   Temp:    99.5 F (37.5 C)  TempSrc:    Oral  SpO2: 98% 96% 95%    General:  A&O x 3, NAD, nontoxic, pleasant/cooperative Head/Eye: No conjunctival hemorrhage, no icterus, [place]/AT, No nystagmus ENT:  No icterus,  No thrush, good dentition, no pharyngeal exudate Neck:  No masses, no lymphadenpathy, no bruits CV:  RRR, no rub, no gallop, no S3 Lung:  CTAB, good air movement, no wheeze, no rhonchi Abdomen: soft/NT, +BS, nondistended, no peritoneal signs Ext: No cyanosis, No rashes, No petechiae, No lymphangitis,2 + LE edema L>R;  Hematoma left hip without crepitance Neuro: CNII-XII intact, strength 4/5 in bilateral upper and lower extremities, no dysmetria  Labs on Admission:  Basic Metabolic Panel:  Recent Labs Lab 07/13/16 1758  NA 139  K 3.7  CL 107  CO2 25  GLUCOSE 107*  BUN 19  CREATININE 0.92  CALCIUM 9.1   Liver Function Tests: No results for input(s): AST, ALT, ALKPHOS, BILITOT, PROT, ALBUMIN in the last 168 hours. No results for input(s): LIPASE, AMYLASE in the last 168 hours. No results for input(s): AMMONIA in the last 168 hours. CBC:  Recent Labs Lab 07/13/16 1758 07/13/16 2150  WBC 5.5 6.2  NEUTROABS 3.8  --   HGB 10.9* 10.5*  HCT 32.9* 31.8*  MCV 94.5 94.4  PLT 130* 125*   Coagulation Profile:  Recent Labs Lab 07/13/16 2150  INR 1.83   Cardiac Enzymes:  Recent Labs Lab 07/13/16 1758  CKTOTAL 77   BNP: Invalid input(s): POCBNP CBG: No results for input(s): GLUCAP in the last 168 hours. Urine analysis:    Component Value Date/Time   COLORURINE YELLOW 07/13/2016 2013   APPEARANCEUR CLEAR 07/13/2016 2013   LABSPEC  1.011 07/13/2016 2013   PHURINE 7.0 07/13/2016 2013   GLUCOSEU NEGATIVE 07/13/2016 2013   HGBUR MODERATE (A) 07/13/2016 2013   BILIRUBINUR NEGATIVE 07/13/2016 2013   KETONESUR NEGATIVE 07/13/2016 2013   PROTEINUR NEGATIVE 07/13/2016 2013   UROBILINOGEN 4.0 (H) 08/04/2014 1608   NITRITE NEGATIVE 07/13/2016 2013   LEUKOCYTESUR NEGATIVE 07/13/2016 2013   Sepsis Labs: @LABRCNTIP (procalcitonin:4,lacticidven:4) )No results found for this or any previous visit (from the past  240 hour(s)).   Radiological Exams on Admission: Ct Head Wo Contrast  Result Date: 07/13/2016 CLINICAL DATA:  Dizziness prior to a fall today at home. EXAM: CT HEAD WITHOUT CONTRAST TECHNIQUE: Contiguous axial images were obtained from the base of the skull through the vertex without intravenous contrast. COMPARISON:  08/08/2014. 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 Mildly progressive diffuse enlargement of the ventricles and subarachnoid spaces. Patchy white matter low density in both cerebral hemispheres. Old right caudate head lacunar infarct. No intracranial hemorrhage, mass lesion or CT evidence of acute infarction. Vascular: No hyperdense vessel or unexpected calcification. Skull: Normal. Negative for fracture or focal lesion. Sinuses/Orbits: No acute finding. Other: None. IMPRESSION: 1. No acute abnormality. 2. 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 3. Stable old right caudate head lacunar infarct. 4. Mildly progressive atrophy and chronic small vessel white matter ischemic changes in both cerebral hemispheres. Electronically Signed   By: Claudie Revering M.D.   On: 07/13/2016 17:09   Dg Hip Unilat With Pelvis 2-3 Views Left  Result Date: 07/13/2016 CLINICAL DATA:  Left hip pain following a fall today. EXAM: DG HIP (WITH OR WITHOUT PELVIS) 2-3V LEFT COMPARISON:  12/17/2015. FINDINGS: Interval left femoral head  prosthesis. No fracture or dislocation seen. Diffuse osteopenia. IMPRESSION: No fracture or dislocation. Electronically Signed   By: Claudie Revering M.D.   On: 07/13/2016 17:05    EKG: Independently reviewed. Atrial fibrillation, nonspecific ST-T wave changes.    Time spent:60 minutes Code Status:   FULL Family Communication:  Daughters updated at bedside Disposition Plan: expect 1-2 day hospitalization Consults called: none DVT Prophylaxis:coumadin  Ethelene Closser, DO  [facility] Pager [phone]  If 7PM-7AM, please contact night-coverage [email or web address] Password [facility] 07/13/2016, 11:23 PM

## 2016-07-13 NOTE — ED Notes (Signed)
Transported to X-ray

## 2016-07-13 NOTE — ED Notes (Signed)
Large hematoma noted to pt's L hip with bruising.  Isaacs was notified and have been at bedside to re-assess.

## 2016-07-13 NOTE — ED Notes (Signed)
Patient is resting comfortably. Pt and family members updated by Issacs EDP

## 2016-07-13 NOTE — ED Notes (Signed)
Family at bedside. 

## 2016-07-14 ENCOUNTER — Observation Stay (HOSPITAL_COMMUNITY): Payer: Medicare Other

## 2016-07-14 ENCOUNTER — Encounter (HOSPITAL_COMMUNITY): Payer: Self-pay

## 2016-07-14 DIAGNOSIS — I110 Hypertensive heart disease with heart failure: Secondary | ICD-10-CM | POA: Diagnosis present

## 2016-07-14 DIAGNOSIS — D649 Anemia, unspecified: Secondary | ICD-10-CM | POA: Diagnosis present

## 2016-07-14 DIAGNOSIS — W19XXXA Unspecified fall, initial encounter: Secondary | ICD-10-CM | POA: Diagnosis not present

## 2016-07-14 DIAGNOSIS — T148XXA Other injury of unspecified body region, initial encounter: Secondary | ICD-10-CM | POA: Diagnosis not present

## 2016-07-14 DIAGNOSIS — Z79899 Other long term (current) drug therapy: Secondary | ICD-10-CM | POA: Diagnosis not present

## 2016-07-14 DIAGNOSIS — I2510 Atherosclerotic heart disease of native coronary artery without angina pectoris: Secondary | ICD-10-CM | POA: Diagnosis present

## 2016-07-14 DIAGNOSIS — R040 Epistaxis: Secondary | ICD-10-CM | POA: Diagnosis not present

## 2016-07-14 DIAGNOSIS — I5032 Chronic diastolic (congestive) heart failure: Secondary | ICD-10-CM | POA: Diagnosis present

## 2016-07-14 DIAGNOSIS — I482 Chronic atrial fibrillation: Secondary | ICD-10-CM | POA: Diagnosis present

## 2016-07-14 DIAGNOSIS — Z8673 Personal history of transient ischemic attack (TIA), and cerebral infarction without residual deficits: Secondary | ICD-10-CM | POA: Diagnosis not present

## 2016-07-14 DIAGNOSIS — S7002XA Contusion of left hip, initial encounter: Secondary | ICD-10-CM | POA: Diagnosis present

## 2016-07-14 DIAGNOSIS — Z8249 Family history of ischemic heart disease and other diseases of the circulatory system: Secondary | ICD-10-CM | POA: Diagnosis not present

## 2016-07-14 DIAGNOSIS — I872 Venous insufficiency (chronic) (peripheral): Secondary | ICD-10-CM | POA: Diagnosis not present

## 2016-07-14 DIAGNOSIS — Z888 Allergy status to other drugs, medicaments and biological substances status: Secondary | ICD-10-CM | POA: Diagnosis not present

## 2016-07-14 DIAGNOSIS — Z9049 Acquired absence of other specified parts of digestive tract: Secondary | ICD-10-CM | POA: Diagnosis not present

## 2016-07-14 DIAGNOSIS — M79609 Pain in unspecified limb: Secondary | ICD-10-CM | POA: Diagnosis not present

## 2016-07-14 DIAGNOSIS — R609 Edema, unspecified: Secondary | ICD-10-CM

## 2016-07-14 DIAGNOSIS — E039 Hypothyroidism, unspecified: Secondary | ICD-10-CM | POA: Diagnosis present

## 2016-07-14 DIAGNOSIS — H409 Unspecified glaucoma: Secondary | ICD-10-CM | POA: Diagnosis present

## 2016-07-14 DIAGNOSIS — Z96642 Presence of left artificial hip joint: Secondary | ICD-10-CM | POA: Diagnosis present

## 2016-07-14 DIAGNOSIS — Y9301 Activity, walking, marching and hiking: Secondary | ICD-10-CM | POA: Diagnosis present

## 2016-07-14 DIAGNOSIS — Z823 Family history of stroke: Secondary | ICD-10-CM | POA: Diagnosis not present

## 2016-07-14 DIAGNOSIS — Y92090 Kitchen in other non-institutional residence as the place of occurrence of the external cause: Secondary | ICD-10-CM | POA: Diagnosis not present

## 2016-07-14 DIAGNOSIS — L120 Bullous pemphigoid: Secondary | ICD-10-CM | POA: Diagnosis present

## 2016-07-14 DIAGNOSIS — S7002XS Contusion of left hip, sequela: Secondary | ICD-10-CM | POA: Diagnosis not present

## 2016-07-14 DIAGNOSIS — Z7901 Long term (current) use of anticoagulants: Secondary | ICD-10-CM | POA: Diagnosis not present

## 2016-07-14 DIAGNOSIS — Z9181 History of falling: Secondary | ICD-10-CM | POA: Diagnosis not present

## 2016-07-14 DIAGNOSIS — Z85828 Personal history of other malignant neoplasm of skin: Secondary | ICD-10-CM | POA: Diagnosis not present

## 2016-07-14 DIAGNOSIS — Z905 Acquired absence of kidney: Secondary | ICD-10-CM | POA: Diagnosis not present

## 2016-07-14 DIAGNOSIS — R262 Difficulty in walking, not elsewhere classified: Secondary | ICD-10-CM | POA: Diagnosis not present

## 2016-07-14 DIAGNOSIS — Z952 Presence of prosthetic heart valve: Secondary | ICD-10-CM | POA: Diagnosis not present

## 2016-07-14 DIAGNOSIS — W010XXA Fall on same level from slipping, tripping and stumbling without subsequent striking against object, initial encounter: Secondary | ICD-10-CM | POA: Diagnosis present

## 2016-07-14 DIAGNOSIS — Z951 Presence of aortocoronary bypass graft: Secondary | ICD-10-CM | POA: Diagnosis not present

## 2016-07-14 DIAGNOSIS — Z90710 Acquired absence of both cervix and uterus: Secondary | ICD-10-CM | POA: Diagnosis not present

## 2016-07-14 DIAGNOSIS — D696 Thrombocytopenia, unspecified: Secondary | ICD-10-CM | POA: Diagnosis not present

## 2016-07-14 DIAGNOSIS — E038 Other specified hypothyroidism: Secondary | ICD-10-CM | POA: Diagnosis not present

## 2016-07-14 DIAGNOSIS — I10 Essential (primary) hypertension: Secondary | ICD-10-CM | POA: Diagnosis not present

## 2016-07-14 LAB — BASIC METABOLIC PANEL
Anion gap: 8 (ref 5–15)
BUN: 16 mg/dL (ref 6–20)
CO2: 24 mmol/L (ref 22–32)
CREATININE: 0.85 mg/dL (ref 0.44–1.00)
Calcium: 8.8 mg/dL — ABNORMAL LOW (ref 8.9–10.3)
Chloride: 105 mmol/L (ref 101–111)
GFR, EST NON AFRICAN AMERICAN: 58 mL/min — AB (ref >60)
Glucose, Bld: 93 mg/dL (ref 65–99)
Potassium: 3.5 mmol/L (ref 3.5–5.1)
SODIUM: 137 mmol/L (ref 135–145)

## 2016-07-14 LAB — PROTIME-INR
INR: 1.86
PROTHROMBIN TIME: 21.7 s — AB (ref 11.4–15.2)

## 2016-07-14 LAB — APTT
aPTT: 36 seconds (ref 24–36)
aPTT: 70 seconds — ABNORMAL HIGH (ref 24–36)
aPTT: 72 seconds — ABNORMAL HIGH (ref 24–36)

## 2016-07-14 LAB — CBC
HCT: 31.1 % — ABNORMAL LOW (ref 36.0–46.0)
Hemoglobin: 10.5 g/dL — ABNORMAL LOW (ref 12.0–15.0)
MCH: 31.9 pg (ref 26.0–34.0)
MCHC: 33.8 g/dL (ref 30.0–36.0)
MCV: 94.5 fL (ref 78.0–100.0)
PLATELETS: 119 10*3/uL — AB (ref 150–400)
RBC: 3.29 MIL/uL — ABNORMAL LOW (ref 3.87–5.11)
RDW: 15.1 % (ref 11.5–15.5)
WBC: 5 10*3/uL (ref 4.0–10.5)

## 2016-07-14 MED ORDER — ATENOLOL 50 MG PO TABS
50.0000 mg | ORAL_TABLET | Freq: Every day | ORAL | Status: DC
  Administered 2016-07-14 – 2016-07-20 (×7): 50 mg via ORAL
  Filled 2016-07-14 (×7): qty 1

## 2016-07-14 MED ORDER — FUROSEMIDE 40 MG PO TABS
40.0000 mg | ORAL_TABLET | Freq: Every day | ORAL | Status: DC
  Administered 2016-07-14 – 2016-07-26 (×13): 40 mg via ORAL
  Filled 2016-07-14 (×13): qty 1

## 2016-07-14 MED ORDER — ONDANSETRON HCL 4 MG/2ML IJ SOLN: 4.0000 mg | Freq: Four times a day (QID) | INTRAMUSCULAR | Status: DC | PRN

## 2016-07-14 MED ORDER — CLOBETASOL PROPIONATE 0.05 % EX OINT: 1.0000 "application " | TOPICAL_OINTMENT | Freq: Two times a day (BID) | CUTANEOUS | Status: DC | PRN

## 2016-07-14 MED ORDER — DIGOXIN 125 MCG PO TABS
0.0625 mg | ORAL_TABLET | Freq: Every day | ORAL | Status: DC
  Administered 2016-07-14 – 2016-07-17 (×4): 0.0625 mg via ORAL
  Filled 2016-07-14 (×4): qty 1

## 2016-07-14 MED ORDER — LISINOPRIL 10 MG PO TABS
10.0000 mg | ORAL_TABLET | Freq: Every day | ORAL | Status: DC
  Administered 2016-07-14 – 2016-07-26 (×13): 10 mg via ORAL
  Filled 2016-07-14 (×13): qty 1

## 2016-07-14 MED ORDER — VITAMIN D 1000 UNITS PO TABS
2000.0000 [IU] | ORAL_TABLET | Freq: Every evening | ORAL | Status: DC
  Administered 2016-07-14 – 2016-07-25 (×11): 2000 [IU] via ORAL
  Filled 2016-07-14 (×11): qty 2

## 2016-07-14 MED ORDER — LATANOPROST 0.005 % OP SOLN
1.0000 [drp] | Freq: Every day | OPHTHALMIC | Status: DC
  Administered 2016-07-17 – 2016-07-25 (×7): 1 [drp] via OPHTHALMIC
  Filled 2016-07-14 (×2): qty 2.5

## 2016-07-14 MED ORDER — LEVOTHYROXINE SODIUM 50 MCG PO TABS
50.0000 ug | ORAL_TABLET | Freq: Every day | ORAL | Status: DC
  Administered 2016-07-14 – 2016-07-26 (×13): 50 ug via ORAL
  Filled 2016-07-14 (×13): qty 1

## 2016-07-14 MED ORDER — NIACINAMIDE 500 MG PO TABS: 500.0000 mg | ORAL_TABLET | Freq: Two times a day (BID) | ORAL | Status: DC

## 2016-07-14 MED ORDER — POTASSIUM CHLORIDE CRYS ER 20 MEQ PO TBCR
20.0000 meq | EXTENDED_RELEASE_TABLET | Freq: Every day | ORAL | Status: DC
  Administered 2016-07-14 – 2016-07-26 (×13): 20 meq via ORAL
  Filled 2016-07-14 (×13): qty 1

## 2016-07-14 MED ORDER — ONDANSETRON HCL 4 MG PO TABS: 4.0000 mg | ORAL_TABLET | Freq: Four times a day (QID) | ORAL | Status: DC | PRN

## 2016-07-14 MED ORDER — CALCIUM CARBONATE-VITAMIN D 500-200 MG-UNIT PO TABS
1.0000 | ORAL_TABLET | Freq: Every evening | ORAL | Status: DC
  Administered 2016-07-14 – 2016-07-25 (×12): 1 via ORAL
  Filled 2016-07-14 (×12): qty 1

## 2016-07-14 MED ORDER — ADULT MULTIVITAMIN W/MINERALS CH
1.0000 | ORAL_TABLET | Freq: Every evening | ORAL | Status: DC
  Administered 2016-07-14 – 2016-07-25 (×12): 1 via ORAL
  Filled 2016-07-14 (×12): qty 1

## 2016-07-14 MED ORDER — ACETAMINOPHEN 650 MG RE SUPP: 650.0000 mg | Freq: Four times a day (QID) | RECTAL | Status: DC | PRN

## 2016-07-14 MED ORDER — SODIUM CHLORIDE 0.9 % IV SOLN
0.1000 mg/kg/h | INTRAVENOUS | Status: DC
  Administered 2016-07-14 – 2016-07-24 (×7): 0.1 mg/kg/h via INTRAVENOUS
  Filled 2016-07-14 (×7): qty 250

## 2016-07-14 MED ORDER — ACETAMINOPHEN 325 MG PO TABS
650.0000 mg | ORAL_TABLET | Freq: Four times a day (QID) | ORAL | Status: DC | PRN
  Administered 2016-07-17 – 2016-07-24 (×2): 650 mg via ORAL
  Filled 2016-07-14 (×2): qty 2

## 2016-07-14 NOTE — ED Notes (Signed)
Daughter notified of pt room status

## 2016-07-14 NOTE — ED Notes (Signed)
PAPER TAPE ONLY

## 2016-07-14 NOTE — Progress Notes (Signed)
ANTICOAGULATION CONSULT NOTE - FOLLOW UP  Pharmacy Consult:  Bivalirudin Indication:  History of mechanical MVR and Afib  Allergies  Allergen Reactions  . Heparin Anaphylaxis  . Codeine Other (See Comments)    Unknown     Patient Measurements: Ht: 64 in   Wt: 60 kg  Vital Signs: Temp: 99.1 F (37.3 C) (03/23 0632) Temp Source: Oral (03/23 3662) BP: 132/50 (03/23 0800) Pulse Rate: 75 (03/23 0800)  Labs:  Recent Labs  07/13/16 1758 07/13/16 2150 07/14/16 0233 07/14/16 0611 07/14/16 0830  HGB 10.9* 10.5*  --  10.5*  --   HCT 32.9* 31.8*  --  31.1*  --   PLT 130* 125*  --  119*  --   APTT  --   --  36 70* 72*  LABPROT  --  21.4* 21.7*  --   --   INR  --  1.83 1.86  --   --   CREATININE 0.92  --  0.85  --   --   CKTOTAL 77  --   --   --   --     CrCl cannot be calculated (Unknown ideal weight.).    Assessment: 14 YOF with history of mechanical AVR in 2001 and Afib on Coumadin PTA.  She presented s/p fall resulting in a large left hip hematoma.  Coumadin was placed on hold and patient was transitioned to Angiomax given heparin allergy.  APTT therapeutic x 2.  No bleeding reported.   Goal of Therapy:  INR 2.5-3.5 PTT 50-85 sec Monitor platelets by anticoagulation protocol: Yes    Plan:  Continue bivalirudin at 0.1 mg/kg/hr (= 6 mg/hr) Daily CBC and aPTT Monitor closely for hematoma expansion/reduction   Montrail Mehrer D. Mina Marble, PharmD, BCPS Pager:  [phone] 07/14/2016, 9:31 AM

## 2016-07-14 NOTE — Progress Notes (Signed)
PT Cancellation Note  Patient Details Name: Amanda Richardson MRN: [id number] DOB: 1923/12/10   Cancelled Treatment:    Reason Eval/Treat Not Completed: Medical issues which prohibited therapy (LE dopplers pending, INR subtherapeutic, will follow. )   [name] 07/14/2016, 2:39 PM [phone]

## 2016-07-14 NOTE — Progress Notes (Addendum)
PROGRESS NOTE    Amanda Richardson  NLG:[id number] DOB: 1924-01-20 DOA: 07/13/2016 PCP: Merrilee Seashore, MD   Outpatient Specialists:     Brief Narrative:  Amanda Richardson is a 83 y.o. female with medical history of atrial fibrillation, bullous pemphigoid, hypertension, hypothyroidism presenting after a mechanical fall on the morning of 07/13/2016. The patient had unwitnessed fall while she was walking across the done following up on her left side. There was no loss of consciousness. The patient denied any prodromal symptoms of chest discomfort, short of breath, dizziness. The patient did not have any significant pain, but was too weak to get up. She laid on the floor for approximately 2-3 hours prior to her daughter coming back to checkup on her. EMS was activated secondary to the patient's weakness and inability to get up.  Patient denies fevers, chills, headache, chest pain, dyspnea, nausea, vomiting, diarrhea, abdominal pain, dysuria, hematuria, hematochezia, and melena. The patient denies frequent falls. She has not fallen since Aug 2017 during which time she had a left hip fracture requiring left hemiarthroplasty   Assessment & Plan:   Active Problems:   H/O mechanical mitral valve replacement 2001   Hypertension   Chronic atrial fibrillation ([facility])   Hypothyroidism   Bullous pemphigoid   Chronic anticoagulation   Hematoma of left hip   Left hip hematoma/leg weakness -lives home alone -xray left hip neg for fracture -MRI cancelled as patient up with nurse walking to bathroom -Trend hemoglobin due to hematoma -Hold Coumadin and monitor INR and hematoma -consult ortho if Hgb drops and/or hematoma enlarges  History of mechanical mitral valve replacement 2001 -Holding Coumadin -Start patient on IV bivalirudin (heparin allergy) and monitor size of hematoma and hemoglobin  Chronic atrial fibrillation -Mild elevation of heart rate secondary to pain and not taken her meds on  3/22 -Continue digoxin and atenolol -Anticoagulation with IV bivalirudin (heparin allergy) for now as bridge  Hypertension -Continue atenolol and lisinopril  Chronic lower extremity edema -Venous duplex left lower extremity due to worsening left lower extremity edema -Continue compression stockings  Bullous pemphigoid -Continue Temovate -Continue niacinamide  Hypothyroidism -synthroid  Thrombocytopenia -chronic -Monitor CBC   DVT prophylaxis:  Fully anticoagulated   Code Status: Full Code   Family Communication:   Disposition Plan:     Consultants:      Subjective: Up walking in bathroom with nurse Pain much improved this AM  Objective: Vitals:   07/14/16 0000 07/14/16 0137 07/14/16 0632 07/14/16 0800  BP: (!) 127/92 (!) 152/47 137/72 (!) 132/50  Pulse: 84 71 90 75  Resp: 20 20 18    Temp:   99.1 F (37.3 C)   TempSrc:   Oral   SpO2: 95% 96% 95%     Intake/Output Summary (Last 24 hours) at 07/14/16 1314 Last data filed at 07/14/16 1000  Gross per 24 hour  Intake              240 ml  Output                0 ml  Net              240 ml   There were no vitals filed for this visit.  Examination:  General exam: Appears calm and comfortable  Respiratory system: Clear to auscultation. Respiratory effort normal. Cardiovascular system: S1 & S2 heard, RRR. No JVD, murmurs, rubs, gallops or clicks. No pedal edema. Gastrointestinal system: Abdomen is nondistended, soft and nontender. No organomegaly or masses  felt. Normal bowel sounds heard. Central nervous system: Alert  Skin: bruising on left elbow and left hip-     Data Reviewed: I have personally reviewed following labs and imaging studies  CBC:  Recent Labs Lab 07/13/16 1758 07/13/16 2150 07/14/16 0611  WBC 5.5 6.2 5.0  NEUTROABS 3.8  --   --   HGB 10.9* 10.5* 10.5*  HCT 32.9* 31.8* 31.1*  MCV 94.5 94.4 94.5  PLT 130* 125* 751*   Basic Metabolic Panel:  Recent Labs Lab  07/13/16 1758 07/14/16 0233  NA 139 137  K 3.7 3.5  CL 107 105  CO2 25 24  GLUCOSE 107* 93  BUN 19 16  CREATININE 0.92 0.85  CALCIUM 9.1 8.8*   GFR: CrCl cannot be calculated (Unknown ideal weight.). Liver Function Tests: No results for input(s): AST, ALT, ALKPHOS, BILITOT, PROT, ALBUMIN in the last 168 hours. No results for input(s): LIPASE, AMYLASE in the last 168 hours. No results for input(s): AMMONIA in the last 168 hours. Coagulation Profile:  Recent Labs Lab 07/13/16 2150 07/14/16 0233  INR 1.83 1.86   Cardiac Enzymes:  Recent Labs Lab 07/13/16 1758  CKTOTAL 77   BNP (last 3 results) No results for input(s): PROBNP in the last 8760 hours. HbA1C: No results for input(s): HGBA1C in the last 72 hours. CBG: No results for input(s): GLUCAP in the last 168 hours. Lipid Profile: No results for input(s): CHOL, HDL, LDLCALC, TRIG, CHOLHDL, LDLDIRECT in the last 72 hours. Thyroid Function Tests: No results for input(s): TSH, T4TOTAL, FREET4, T3FREE, THYROIDAB in the last 72 hours. Anemia Panel: No results for input(s): VITAMINB12, FOLATE, FERRITIN, TIBC, IRON, RETICCTPCT in the last 72 hours. Urine analysis:    Component Value Date/Time   COLORURINE YELLOW 07/13/2016 2013   APPEARANCEUR CLEAR 07/13/2016 2013   LABSPEC 1.011 07/13/2016 2013   PHURINE 7.0 07/13/2016 2013   GLUCOSEU NEGATIVE 07/13/2016 2013   HGBUR MODERATE (A) 07/13/2016 2013   BILIRUBINUR NEGATIVE 07/13/2016 2013   KETONESUR NEGATIVE 07/13/2016 2013   PROTEINUR NEGATIVE 07/13/2016 2013   UROBILINOGEN 4.0 (H) 08/04/2014 1608   NITRITE NEGATIVE 07/13/2016 2013   LEUKOCYTESUR NEGATIVE 07/13/2016 2013     )No results found for this or any previous visit (from the past 240 hour(s)).    Anti-infectives    None       Radiology Studies: Ct Head Wo Contrast  Result Date: 07/13/2016 CLINICAL DATA:  Dizziness prior to a fall today at home. EXAM: CT HEAD WITHOUT CONTRAST TECHNIQUE:  Contiguous axial images were obtained from the base of the skull through the vertex without intravenous contrast. COMPARISON:  08/08/2014. 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 Mildly progressive diffuse enlargement of the ventricles and subarachnoid spaces. Patchy white matter low density in both cerebral hemispheres. Old right caudate head lacunar infarct. No intracranial hemorrhage, mass lesion or CT evidence of acute infarction. Vascular: No hyperdense vessel or unexpected calcification. Skull: Normal. Negative for fracture or focal lesion. Sinuses/Orbits: No acute finding. Other: None. IMPRESSION: 1. No acute abnormality. 2. 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 3. Stable old right caudate head lacunar infarct. 4. Mildly progressive atrophy and chronic small vessel white matter ischemic changes in both cerebral hemispheres. Electronically Signed   By: Claudie Revering M.D.   On: 07/13/2016 17:09   Dg Hip Unilat With Pelvis 2-3 Views Left  Result Date: 07/13/2016 CLINICAL DATA:  Left hip pain following a  fall today. EXAM: DG HIP (WITH OR WITHOUT PELVIS) 2-3V LEFT COMPARISON:  12/17/2015. FINDINGS: Interval left femoral head prosthesis. No fracture or dislocation seen. Diffuse osteopenia. IMPRESSION: No fracture or dislocation. Electronically Signed   By: Claudie Revering M.D.   On: 07/13/2016 17:05        Scheduled Meds: . atenolol  50 mg Oral Daily  . calcium-vitamin D  1 tablet Oral QPM  . cholecalciferol  2,000 Units Oral QPM  . digoxin  0.0625 mg Oral Daily  . furosemide  40 mg Oral Daily  . latanoprost  1 drop Both Eyes QHS  . levothyroxine  50 mcg Oral QAC breakfast  . lisinopril  10 mg Oral Daily  . multivitamin with minerals  1 tablet Oral QPM  . potassium chloride SA  20 mEq Oral Daily   Continuous Infusions: . bivalirudin (ANGIOMAX) infusion 0.5 mg/mL (Non-ACS indications)  0.1 mg/kg/hr (07/14/16 0357)     LOS: 0 days    Time spent: 35 min    [place], DO [facility] Pager [phone]  If 7PM-7AM, please contact night-coverage [email or web address] Password [email or web address] 07/14/2016, 1:14 PM

## 2016-07-14 NOTE — ED Notes (Addendum)
[phone] Daleen Snook daughter phone number

## 2016-07-14 NOTE — Progress Notes (Addendum)
ANTICOAGULATION CONSULT NOTE - Initial Consult  Pharmacy Consult for Bivalirudin Indication: h/o MVR and afib - holding coumadin  Allergies  Allergen Reactions  . Heparin Anaphylaxis  . Codeine Other (See Comments)    Unknown     Patient Measurements:    Ht: 64 in  Wt: 60 kg  Vital Signs: Temp: 99.5 F (37.5 C) (03/22 2154) Temp Source: Oral (03/22 2154) BP: 152/47 (03/23 0137) Pulse Rate: 71 (03/23 0137)  Labs:  Recent Labs  07/13/16 1758 07/13/16 2150  HGB 10.9* 10.5*  HCT 32.9* 31.8*  PLT 130* 125*  LABPROT  --  21.4*  INR  --  1.83  CREATININE 0.92  --   CKTOTAL 77  --     CrCl cannot be calculated (Unknown ideal weight.).   Medical History: Past Medical History:  Diagnosis Date  . Allergic rhinitis   . Atrial fibrillation ([place])   . Basal cell carcinoma   . Bilateral lower extremity edema    history of venous insufficiency  . Bullous pemphigoid   . CHF (congestive heart failure) ([place])   . Chronic anemia    not applicable since [id number] when pt had hysterectomy   . Chronic anticoagulation   . Coronary artery disease   . DVT (deep venous thrombosis) ([place])    possible in 1990s and does not remember being on blood thinner  . Glaucoma   . H/O mitral valve replacement 2001   mechanical heart valve; family states indication may have been MR from MVP or  rheumatic heart disease  . Hypertension   . Hypothyroidism   . 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 S/p nephrectomy    left  . Shortness of breath dyspnea   . Thyroid disease   . Urinary incontinence     Medications:  Prescriptions Prior to Admission  Medication Sig Dispense Refill Last Dose  . atenolol (TENORMIN) 50 MG tablet Take 50 mg by mouth daily.   07/12/2016 at 0800  . calcium-vitamin D (OSCAL WITH D) 500-200 MG-UNIT per tablet Take 1 tablet by mouth every evening.    07/12/2016 at Unknown time  . Cholecalciferol 2000 units CAPS Take 1 capsule by mouth every evening.    07/12/2016 at Unknown time  . clobetasol ointment (TEMOVATE) 5.28 % Apply 1 application topically 2 (two) times daily as needed (blisters for bulous pemphigoid).    07/12/2016 at Unknown time  . digoxin (LANOXIN) 0.125 MG tablet Take 0.0625 mg by mouth daily.    07/12/2016 at Unknown time  . furosemide (LASIX) 40 MG tablet Take 1 tablet (40 mg total) by mouth daily.   07/12/2016 at Unknown time  . KLOR-CON M20 20 MEQ tablet Take 1 tablet by mouth daily.   0 07/12/2016 at Unknown time  . latanoprost (XALATAN) 0.005 % ophthalmic solution Place 1 drop into both eyes at bedtime.    07/12/2016 at Unknown time  . levothyroxine (SYNTHROID, LEVOTHROID) 50 MCG tablet Take 50 mcg by mouth daily.  6 07/13/2016 at Unknown time  . lisinopril (PRINIVIL,ZESTRIL) 10 MG tablet Take 10 mg by mouth daily.   3 07/12/2016 at Unknown time  . Multiple Vitamin (MULTIVITAMIN WITH MINERALS) TABS Take 1 tablet by mouth every evening.    07/12/2016 at Unknown time  . niacinamide 500 MG tablet Take 500 mg by mouth 2 (two) times daily with a meal.   07/12/2016 at Unknown time  . Probiotic Product (PROBIOTIC PO) Take 1 capsule by mouth  every other day.   07/12/2016 at Unknown time  . warfarin (COUMADIN) 3 MG tablet Take 1.5-3 mg by mouth See admin instructions. Take 1/2 tablet on Monday then take 1 tablet all the other days   07/12/2016 at 1800  . senna-docusate (SENOKOT-S) 8.6-50 MG tablet Take 1 tablet by mouth 2 (two) times daily. (Patient not taking: Reported on 07/13/2016)   Not Taking at Unknown time    Assessment: 83 y.o. F presents s/p fall. Pt with L hip hematoma. Pt on coumadin PTA for h/o MVR (2001) and afib. Admission INR 1.83 (subtherapeutic). Hgb and plt low but stable for this patient. Pt with anaphylaxis allergy to heparin so plan to bridge with bivalirudin for now. Baseline PTT 36 sec. Coumadin home dose: 3mg  daily except for 1.5mg  on Monday - last taken 3/21  Goal of Therapy:  INR 2.5-3.5 PTT 50-85 sec Monitor platelets  by anticoagulation protocol: Yes   Plan:  Bivalirudin 0.1 mg/kg/hr (6mg /hr) PTT in 2 hours. Will check PTT every 2 hours until 2 therapeutic levels are obtained Daily CBC and PTT  Sherlon Handing, PharmD, BCPS Clinical pharmacist, pager [phone] 07/14/2016,2:31 AM   Addendum [phone]): PTT 70 sec (therapeutic) on gtt at 6mg /hr. Will f/u 2 hour PTT to confirm therapeutic  Sherlon Handing, PharmD, BCPS Clinical pharmacist, pager [phone] 07/14/2016 7:08 AM

## 2016-07-14 NOTE — Progress Notes (Signed)
**  Preliminary report by tech**  Left lower extremity venous duplex complete. There is no evidence of deep or superficial vein thrombosis involving the left lower extremity. All visualized vessels appear patent and compressible. There is no evidence of a Baker's cyst on the left.  07/14/16 2:58 PM Amanda Richardson RVT

## 2016-07-15 DIAGNOSIS — S7002XS Contusion of left hip, sequela: Secondary | ICD-10-CM

## 2016-07-15 LAB — PROTIME-INR
INR: 2.37
PROTHROMBIN TIME: 26.3 s — AB (ref 11.4–15.2)

## 2016-07-15 LAB — CBC
HCT: 31 % — ABNORMAL LOW (ref 36.0–46.0)
HEMOGLOBIN: 10.2 g/dL — AB (ref 12.0–15.0)
MCH: 30.9 pg (ref 26.0–34.0)
MCHC: 32.9 g/dL (ref 30.0–36.0)
MCV: 93.9 fL (ref 78.0–100.0)
Platelets: 122 10*3/uL — ABNORMAL LOW (ref 150–400)
RBC: 3.3 MIL/uL — ABNORMAL LOW (ref 3.87–5.11)
RDW: 14.6 % (ref 11.5–15.5)
WBC: 5.4 10*3/uL (ref 4.0–10.5)

## 2016-07-15 LAB — APTT: aPTT: 75 seconds — ABNORMAL HIGH (ref 24–36)

## 2016-07-15 NOTE — Progress Notes (Addendum)
Patient ID: Amanda Richardson, female   DOB: 1923-06-04, 83 y.o.   MRN: [id number]  PROGRESS NOTE    Amanda Richardson  HYI:[id number] DOB: 06/25/1923 DOA: 07/13/2016  PCP: Merrilee Seashore, MD   Brief Narrative:  83 y.o.femalewith medical history significant for atrial fibrillation on coumadin, bullous pemphigoid, hypertension, hypothyroidism who presented to [facility] status post mechanical fall the day of the admission.The patient had unwitnessed fall while she was walking and she fell on her left side. There was no loss of consciousness. The patient did not have any significant pain, but was too weak to get up. She laid on the floor for approximately 2-3 hours prior to her daughter coming back to check up on her. EMS was activated secondary to the patient's weakness and inability to get up.   Assessment & Plan:   Left hip hematoma/leg weakness - PT eval in progress  - No acute fractures seen on hip x ray - Continue to monitor hemoglobin and platelet count daily   History of mechanical mitral valve replacement 2001 - Holding Coumadin due to hematoma  - Started patient on IV bivalirudin (heparin allergy)  Chronic atrial fibrillation - CHADS vasc score at 81 (age, gender, hypertension, CHF) - Coumadin on hold due to hematoma but pt on angiomax - Continue digoxin 0.625 mg daily  - Continue atenolol for rate control  Chronic diastolic CHF - Compensated - 2 D ECHO in 2016 with normal EF  Hypertension,essential  - Continue atenolol and lisinopril  Hypothyroidism - Continue synthroid   Chronic lower extremity edema -Venous duplex left lower extremity due to worsening left lower extremity edema -Continue compression stockings  Bullous pemphigoid - Continue Temovate - Continue niacinamide   Thrombocytopenia - Chronic - Continue to monitor CBC daily in the setting of anticoagulation    DVT prophylaxis: On angiomax Code Status: full code  Family Communication: spoke with  pt daughter over the phone  Disposition Plan: home once we know    Consultants:   PT  Procedures:   LE doppler 3/22 - no DVT  Antimicrobials:   None    Subjective: No overnight events.   Objective: Vitals:   07/14/16 2139 07/15/16 0500 07/15/16 0923 07/15/16 1434  BP: (!) 160/51 (!) 140/54 (!) 111/49 (!) 125/35  Pulse: 64 64 68 78  Resp: 15 14  16   Temp: 99.6 F (37.6 C) 99 F (37.2 C)  98.9 F (37.2 C)  TempSrc: Oral Axillary  Oral  SpO2: 98% 94%  99%    Intake/Output Summary (Last 24 hours) at 07/15/16 1644 Last data filed at 07/15/16 1434  Gross per 24 hour  Intake              730 ml  Output                0 ml  Net              730 ml   There were no vitals filed for this visit.  Examination:  General exam: Appears calm and comfortable  Respiratory system: Clear to auscultation. Respiratory effort normal. Cardiovascular system: S1 & S2 heard, Rate controlled  Gastrointestinal system: Abdomen is nondistended, soft and nontender. No organomegaly or masses felt. Normal bowel sounds heard. Central nervous system: No focal neurological deficits. Extremities: Symmetric 5 x 5 power. Skin: No rashes, lesions or ulcers Psychiatry: Judgement and insight appear normal. Mood & affect appropriate.   Data Reviewed: I have personally reviewed following labs and imaging studies  CBC:  Recent Labs Lab 07/13/16 1758 07/13/16 2150 07/14/16 0611 07/15/16 0555  WBC 5.5 6.2 5.0 5.4  NEUTROABS 3.8  --   --   --   HGB 10.9* 10.5* 10.5* 10.2*  HCT 32.9* 31.8* 31.1* 31.0*  MCV 94.5 94.4 94.5 93.9  PLT 130* 125* 119* 505*   Basic Metabolic Panel:  Recent Labs Lab 07/13/16 1758 07/14/16 0233  NA 139 137  K 3.7 3.5  CL 107 105  CO2 25 24  GLUCOSE 107* 93  BUN 19 16  CREATININE 0.92 0.85  CALCIUM 9.1 8.8*   GFR: CrCl cannot be calculated (Unknown ideal weight.). Liver Function Tests: No results for input(s): AST, ALT, ALKPHOS, BILITOT, PROT, ALBUMIN in  the last 168 hours. No results for input(s): LIPASE, AMYLASE in the last 168 hours. No results for input(s): AMMONIA in the last 168 hours. Coagulation Profile:  Recent Labs Lab 07/13/16 2150 07/14/16 0233 07/15/16 0555  INR 1.83 1.86 2.37   Cardiac Enzymes:  Recent Labs Lab 07/13/16 1758  CKTOTAL 77   BNP (last 3 results) No results for input(s): PROBNP in the last 8760 hours. HbA1C: No results for input(s): HGBA1C in the last 72 hours. CBG: No results for input(s): GLUCAP in the last 168 hours. Lipid Profile: No results for input(s): CHOL, HDL, LDLCALC, TRIG, CHOLHDL, LDLDIRECT in the last 72 hours. Thyroid Function Tests: No results for input(s): TSH, T4TOTAL, FREET4, T3FREE, THYROIDAB in the last 72 hours. Anemia Panel: No results for input(s): VITAMINB12, FOLATE, FERRITIN, TIBC, IRON, RETICCTPCT in the last 72 hours. Urine analysis:    Component Value Date/Time   COLORURINE YELLOW 07/13/2016 2013   APPEARANCEUR CLEAR 07/13/2016 2013   LABSPEC 1.011 07/13/2016 2013   PHURINE 7.0 07/13/2016 2013   GLUCOSEU NEGATIVE 07/13/2016 2013   HGBUR MODERATE (A) 07/13/2016 2013   BILIRUBINUR NEGATIVE 07/13/2016 2013   KETONESUR NEGATIVE 07/13/2016 2013   PROTEINUR NEGATIVE 07/13/2016 2013   UROBILINOGEN 4.0 (H) 08/04/2014 1608   NITRITE NEGATIVE 07/13/2016 2013   LEUKOCYTESUR NEGATIVE 07/13/2016 2013   Sepsis Labs: @LABRCNTIP (procalcitonin:4,lacticidven:4)   )No results found for this or any previous visit (from the past 240 hour(s)).    Radiology Studies: Ct Head Wo Contrast  Result Date: 07/13/2016 1. No acute abnormality. 2. 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 3. Stable old right caudate head lacunar infarct. 4. Mildly progressive atrophy and chronic small vessel white matter ischemic changes in both cerebral hemispheres. Electronically Signed   By: Claudie Revering M.D.   On: 07/13/2016 17:09   Dg Hip Unilat With Pelvis 2-3 Views Left Result Date:  07/13/2016 No fracture or dislocation. Electronically Signed   By: Claudie Revering M.D.   On: 07/13/2016 17:05     Scheduled Meds: . atenolol  50 mg Oral Daily  . calcium-vitamin D  1 tablet Oral QPM  . cholecalciferol  2,000 Units Oral QPM  . digoxin  0.0625 mg Oral Daily  . furosemide  40 mg Oral Daily  . latanoprost  1 drop Both Eyes QHS  . levothyroxine  50 mcg Oral QAC breakfast  . lisinopril  10 mg Oral Daily  . multivitamin with minerals  1 tablet Oral QPM  . potassium chloride SA  20 mEq Oral Daily   Continuous Infusions: . bivalirudin (ANGIOMAX) infusion 0.5 mg/mL (Non-ACS indications) 0.1 mg/kg/hr (07/14/16 0357)     LOS: 1 day    Time spent: 25 minutes  Greater than 50% of the time spent on counseling and coordinating  the care.   Leisa Lenz, MD [facility] Pager [phone]  If 7PM-7AM, please contact night-coverage [email or web address] Password [facility] 07/15/2016, 4:44 PM

## 2016-07-15 NOTE — Progress Notes (Signed)
ANTICOAGULATION CONSULT NOTE - FOLLOW UP  Pharmacy Consult:  Bivalirudin Indication:  History of mechanical MVR and Afib  Allergies  Allergen Reactions  . Heparin Anaphylaxis  . Codeine Other (See Comments)    Unknown     Patient Measurements: Ht: 64 in   Wt: 60 kg  Vital Signs: Temp: 99 F (37.2 C) (03/24 0500) Temp Source: Axillary (03/24 0500) BP: 140/54 (03/24 0500) Pulse Rate: 64 (03/24 0500)  Labs:  Recent Labs  07/13/16 1758 07/13/16 2150  07/14/16 0233 07/14/16 0611 07/14/16 0830 07/15/16 0555  HGB 10.9* 10.5*  --   --  10.5*  --  10.2*  HCT 32.9* 31.8*  --   --  31.1*  --  31.0*  PLT 130* 125*  --   --  119*  --  122*  APTT  --   --   < > 36 70* 72* 75*  LABPROT  --  21.4*  --  21.7*  --   --  26.3*  INR  --  1.83  --  1.86  --   --  2.37  CREATININE 0.92  --   --  0.85  --   --   --   CKTOTAL 77  --   --   --   --   --   --   < > = values in this interval not displayed.  CrCl cannot be calculated (Unknown ideal weight.).    Assessment: Amanda Richardson with history of mechanical AVR in 2001 and Afib on Coumadin PTA.  She presented s/p fall resulting in a large left hip hematoma.  Coumadin was placed on hold and patient was transitioned to Angiomax given heparin allergy.  APTT therapeutic.  Patient's INR trended up due to the effect of Angiomax; it does not represent anticoagulation from Coumadin.  Hematoma size stable per RN.  CBC is also stable.   Goal of Therapy:  INR 2.5-3.5 PTT 50-85 sec Monitor platelets by anticoagulation protocol: Yes    Plan:  Continue bivalirudin at 0.1 mg/kg/hr (= 6 mg/hr) Daily CBC and aPTT Monitor closely for hematoma expansion/reduction F/U updated height and weight   Kischa Altice D. Mina Marble, PharmD, BCPS Pager:  [phone] 07/15/2016, 7:19 AM

## 2016-07-15 NOTE — Evaluation (Signed)
Physical Therapy Evaluation Patient Details Name: Amanda Richardson MRN: [id number] DOB: 1924-04-23 Today's Date: 07/15/2016   History of Present Illness  Pt is a 83 yo female admitted through ED following a fall on 07/13/16 resulting in a left hip hematoma with no fracture noted. Pt had a fall in 8/17 requiring a hemiarthroplasty but has no reported falls since. PMH significant for HTN, A-fib, Hypothyroidism, and chronic LE edema.  Clinical Impression  Pt presents with the above diagnosis and below deficits. Prior to admission, pt was living alone with her family checking in periodically throughout the day and assisted with ADLs and IADLs as needed. Pt is able to perform mobility with Min A to min guard this session and has good safety awareness throughout session including reaching back for recliner before sitting without being cued. Pt will benefit from continued acute PT services in order to maximize her outcomes. Pt will require HHPT upon discharge to assist with a return to her PLOF.     Follow Up Recommendations Home health PT    Equipment Recommendations  None recommended by PT    Recommendations for Other Services       Precautions / Restrictions Precautions Precautions: Fall Precaution Comments: Fall resulted in current hospitalization Restrictions Weight Bearing Restrictions: No      Mobility  Bed Mobility Overal bed mobility: Needs Assistance Bed Mobility: Supine to Sit     Supine to sit: HOB elevated;Min assist     General bed mobility comments: Min A for safety and to assist LE's EOB. Pt is able to stabilize trunk at EOB  Transfers Overall transfer level: Needs assistance Equipment used: Rolling walker (2 wheeled) Transfers: Sit to/from Stand Sit to Stand: Min assist         General transfer comment: Min A for initial attempt from EOB. Min A to stabilize trunk once in standing.  Ambulation/Gait Ambulation/Gait assistance: Min assist;Min guard Ambulation  Distance (Feet): 50 Feet Assistive device: Rolling walker (2 wheeled) Gait Pattern/deviations: Step-through pattern Gait velocity: decreased Gait velocity interpretation: Below normal speed for age/gender General Gait Details: Minimal antalgia noted, good sequencing and distance within RW. Min A initially and progressed to Min guard as distance increases.   Stairs            Wheelchair Mobility    Modified Rankin (Stroke Patients Only)       Balance Overall balance assessment: Needs assistance Sitting-balance support: No upper extremity supported;Feet supported Sitting balance-Leahy Scale: Good Sitting balance - Comments: sitting EOB with no back support   Standing balance support: Bilateral upper extremity supported;No upper extremity supported;During functional activity Standing balance-Leahy Scale: Fair Standing balance comment: Able to stand at sink to brush teeth with min guard assist.                              Pertinent Vitals/Pain Pain Assessment: No/denies pain    Home Living Family/patient expects to be discharged to:: Private residence Living Arrangements: Alone Available Help at Discharge: Family;Available 24 hours/day Type of Home: House Home Access: Stairs to enter Entrance Stairs-Rails: Right Entrance Stairs-Number of Steps: 2 Home Layout: One level Home Equipment: Walker - 2 wheels;Cane - single point;Bedside commode;Shower seat;Grab bars - toilet;Grab bars - tub/shower;Hand held shower head      Prior Function Level of Independence: Needs assistance;Independent with assistive device(s)   Gait / Transfers Assistance Needed: walking with a cane inside home without assistance, transferring alone  ADL's / Homemaking Assistance Needed: needs assistance for bathing, but can do bird bath, family assists with shopping  Comments: family is available to help as needed      Hand Dominance   Dominant Hand: Right    Extremity/Trunk  Assessment   Upper Extremity Assessment Upper Extremity Assessment: Defer to OT evaluation    Lower Extremity Assessment Lower Extremity Assessment: LLE deficits/detail;RLE deficits/detail RLE Deficits / Details: Pt with weakness related to general deconditioning. At least 3/5 ankle, knee and hip. LLE Deficits / Details: Pt with weakness related to recent surgery and deconditioning. Pt at least 3/5 ankle, knee and hip but not formally assessed    Cervical / Trunk Assessment Cervical / Trunk Assessment: Kyphotic  Communication   Communication: HOH  Cognition Arousal/Alertness: Awake/alert Behavior During Therapy: WFL for tasks assessed/performed Overall Cognitive Status: Within Functional Limits for tasks assessed                                        General Comments      Exercises General Exercises - Lower Extremity Ankle Circles/Pumps: AROM;Both;20 reps;Supine Quad Sets: AROM;Both;10 reps;Supine Gluteal Sets: AROM;Both;10 reps;Supine   Assessment/Plan    PT Assessment Patient needs continued PT services  PT Problem List Decreased strength;Decreased activity tolerance;Decreased balance;Decreased mobility       PT Treatment Interventions DME instruction;Gait training;Stair training;Functional mobility training;Therapeutic activities;Therapeutic exercise;Balance training;Patient/family education    PT Goals (Current goals can be found in the Care Plan section)  Acute Rehab PT Goals Patient Stated Goal: to get outside to pull her weeds PT Goal Formulation: With patient Time For Goal Achievement: 07/22/16 Potential to Achieve Goals: Good    Frequency Min 5X/week   Barriers to discharge        Co-evaluation               End of Session Equipment Utilized During Treatment: Gait belt Activity Tolerance: Patient tolerated treatment well Patient left: in chair;with call bell/phone within reach;with family/visitor present Nurse Communication:  Mobility status PT Visit Diagnosis: Difficulty in walking, not elsewhere classified (R26.2)    Time: 0932-6712 PT Time Calculation (min) (ACUTE ONLY): 31 min   Charges:   PT Evaluation $PT Eval Moderate Complexity: 1 Procedure PT Treatments $Gait Training: 8-22 mins   PT G Codes:        [name] PT, DPT  [phone]   [name] 07/15/2016, 1:04 PM

## 2016-07-16 ENCOUNTER — Encounter (HOSPITAL_COMMUNITY): Payer: Self-pay | Admitting: Physician Assistant

## 2016-07-16 DIAGNOSIS — T148XXA Other injury of unspecified body region, initial encounter: Secondary | ICD-10-CM

## 2016-07-16 DIAGNOSIS — I482 Chronic atrial fibrillation: Secondary | ICD-10-CM

## 2016-07-16 DIAGNOSIS — Z9181 History of falling: Secondary | ICD-10-CM

## 2016-07-16 DIAGNOSIS — D649 Anemia, unspecified: Secondary | ICD-10-CM

## 2016-07-16 DIAGNOSIS — I872 Venous insufficiency (chronic) (peripheral): Secondary | ICD-10-CM

## 2016-07-16 DIAGNOSIS — D696 Thrombocytopenia, unspecified: Secondary | ICD-10-CM

## 2016-07-16 DIAGNOSIS — I5032 Chronic diastolic (congestive) heart failure: Secondary | ICD-10-CM

## 2016-07-16 LAB — CBC
HCT: 30.2 % — ABNORMAL LOW (ref 36.0–46.0)
Hemoglobin: 10.3 g/dL — ABNORMAL LOW (ref 12.0–15.0)
MCH: 32.5 pg (ref 26.0–34.0)
MCHC: 34.1 g/dL (ref 30.0–36.0)
MCV: 95.3 fL (ref 78.0–100.0)
PLATELETS: 129 10*3/uL — AB (ref 150–400)
RBC: 3.17 MIL/uL — ABNORMAL LOW (ref 3.87–5.11)
RDW: 14.9 % (ref 11.5–15.5)
WBC: 5.8 10*3/uL (ref 4.0–10.5)

## 2016-07-16 LAB — BASIC METABOLIC PANEL
Anion gap: 6 (ref 5–15)
BUN: 22 mg/dL — AB (ref 6–20)
CHLORIDE: 106 mmol/L (ref 101–111)
CO2: 26 mmol/L (ref 22–32)
Calcium: 8.6 mg/dL — ABNORMAL LOW (ref 8.9–10.3)
Creatinine, Ser: 0.98 mg/dL (ref 0.44–1.00)
GFR calc Af Amer: 57 mL/min — ABNORMAL LOW (ref >60)
GFR calc non Af Amer: 49 mL/min — ABNORMAL LOW (ref >60)
Glucose, Bld: 90 mg/dL (ref 65–99)
Potassium: 3.5 mmol/L (ref 3.5–5.1)
Sodium: 138 mmol/L (ref 135–145)

## 2016-07-16 LAB — PROTIME-INR
INR: 1.9
PROTHROMBIN TIME: 22 s — AB (ref 11.4–15.2)

## 2016-07-16 LAB — APTT: aPTT: 64 seconds — ABNORMAL HIGH (ref 24–36)

## 2016-07-16 NOTE — Consult Note (Signed)
Referral MD  Reason for Referral: Left thigh hematoma secondary to fall while on Coumadin; chronic anticoagulation secondary to mechanical mitral valve   Chief Complaint  Patient presents with  . Fall  . Hip Pain  : I fell and bled in my left thigh   HPI: Amanda Richardson is a very nice 83 year old white female. She has multiple medical problems. She has history of mechanical mitral valve. This was placed back in 2001. She is not sure why she needed. She not sure she had rheumatic fever or scarlet fever when she was younger.  She has had multiple prior surgeries. She has bullous pemphigoid. She has chronic diastolic CHF.  She actually lives at home by herself. She is not that diligent with using assistance with ambulation. She apparently fell at home. This was on March 22. She lay floor for a few hours. Her daughter came over. EMS picked her up. Thankfully there was no hip fracture. However showed a large left hematoma.  She has had falls before.  She does have chronic mild thrombocytopenia. This might be from medication or could be myelodysplasia given her age. On admission her platelet count was 130,000. Hemoglobin 10.9. White cell count 5.5. Showed a normal white blood cell differential. MCV was normal at 95.  Her anticoagulation with Coumadin showed an INR of 1.83.  She did have a CT of the head when she came in. This showed no bleeding. She had had an old infarct in the left anterior watershed area. She had old right caudate infarct. She had progressive atrophy and chronic small vessel disease.  She was taking off Coumadin. She now is on bivalirudin infusion.  Cardiology has seen her. They feel there is no alternative for her. This is a risk that needs to be taking because she needs chronic anticoagulation.  Her appetite is okay. She is not a vegetarian.  She does not smoke. She does not drink.  Overall, I will see her performance status is ECOG 3.     Past Medical History:   Diagnosis Date  . Allergic rhinitis   . Anemia    not applicable since IFO-27X when pt had hysterectomy   . Basal cell carcinoma   . Bilateral lower extremity edema    history of venous insufficiency  . Bullous pemphigoid   . Chronic anticoagulation   . Chronic atrial fibrillation ([place])   . Chronic diastolic CHF (congestive heart failure) ([place])   . Coronary artery disease    a. CABG 2001 at time of MVR.  Marland Kitchen DVT (deep venous thrombosis) ([place])    possible in 1990s and does not remember being on blood thinner  . Glaucoma   . H/O mitral valve replacement 2001   mechanical heart valve; family states indication may have been MR from MVP or  rheumatic heart disease  . Hypertension   . Hypothyroidism   . Ischemic bowel disease ([place])    missing 2/3 of bowel  . S/p nephrectomy    left  . Stroke [facility])    a. CT head 2018 showed old left anterior watershed infarct, old right caudate lacunar infarct.  . Thrombocytopenia ([place])   . Urinary incontinence   :  Past Surgical History:  Procedure Laterality Date  . ABDOMINAL HYSTERECTOMY  1960s  . APPENDECTOMY  1960s  . CORONARY ARTERY BYPASS GRAFT  01/2000  . HERNIA REPAIR    . HIP ARTHROPLASTY Left 12/18/2015   Procedure: HEMIARTHROPLASTY  HIP;  Surgeon: Tania Ade, MD;  Location:  [facility];  Service: Orthopedics;  Laterality: Left;  . hx broken collarbone    . LAPAROSCOPIC SMALL BOWEL RESECTION  2004 or 5   adhesions and ischemic bowel  . MITRAL VALVE REPLACEMENT  01/2000   mechanical heart valve  . TONSILLECTOMY  1933  . TRANSTHORACIC ECHOCARDIOGRAM  02/2012   EF 60-65%, mild LVH; mildly thickened AV leaflets with sclerosis and moderate regurg; mechanical MV prosthesis; LA dilated; RVSP increased; RA severely dilated; increased LA pressure; moderate TR  :   Current Facility-Administered Medications:  .  acetaminophen (TYLENOL) tablet 650 mg, 650 mg, Oral, Q6H PRN **OR** acetaminophen (TYLENOL) suppository 650 mg, 650 mg, Rectal, Q6H  PRN, Orson Eva, MD .  atenolol (TENORMIN) tablet 50 mg, 50 mg, Oral, Daily, Orson Eva, MD, 50 mg at 07/16/16 1003 .  bivalirudin (ANGIOMAX) 250 mg in sodium chloride 0.9 % 500 mL (0.5 mg/mL) infusion, 0.1 mg/kg/hr (Order-Specific), Intravenous, Continuous, Franky Macho, RPH, Last Rate: 12 mL/hr at 07/16/16 0106, 0.1 mg/kg/hr at 07/16/16 0106 .  calcium-vitamin D (OSCAL WITH D) 500-200 MG-UNIT per tablet 1 tablet, 1 tablet, Oral, QPM, Orson Eva, MD, 1 tablet at 07/15/16 1832 .  cholecalciferol (VITAMIN D) tablet 2,000 Units, 2,000 Units, Oral, QPM, Orson Eva, MD, 2,000 Units at 07/15/16 1833 .  clobetasol ointment (TEMOVATE) 4.17 % 1 application, 1 application, Topical, BID PRN, Orson Eva, MD .  digoxin (LANOXIN) tablet 0.0625 mg, 0.0625 mg, Oral, Daily, Shanon Brow Tat, MD, 0.0625 mg at 07/16/16 1001 .  furosemide (LASIX) tablet 40 mg, 40 mg, Oral, Daily, Orson Eva, MD, 40 mg at 07/16/16 1000 .  latanoprost (XALATAN) 0.005 % ophthalmic solution 1 drop, 1 drop, Both Eyes, QHS, David Tat, MD .  levothyroxine (SYNTHROID, LEVOTHROID) tablet 50 mcg, 50 mcg, Oral, QAC breakfast, Orson Eva, MD, 50 mcg at 07/16/16 0720 .  lisinopril (PRINIVIL,ZESTRIL) tablet 10 mg, 10 mg, Oral, Daily, Orson Eva, MD, 10 mg at 07/16/16 1003 .  multivitamin with minerals tablet 1 tablet, 1 tablet, Oral, QPM, Orson Eva, MD, 1 tablet at 07/15/16 1833 .  ondansetron (ZOFRAN) tablet 4 mg, 4 mg, Oral, Q6H PRN **OR** ondansetron (ZOFRAN) injection 4 mg, 4 mg, Intravenous, Q6H PRN, Shanon Brow Tat, MD .  potassium chloride SA (K-DUR,KLOR-CON) CR tablet 20 mEq, 20 mEq, Oral, Daily, Shanon Brow Tat, MD, 20 mEq at 07/16/16 1000:  . atenolol  50 mg Oral Daily  . calcium-vitamin D  1 tablet Oral QPM  . cholecalciferol  2,000 Units Oral QPM  . digoxin  0.0625 mg Oral Daily  . furosemide  40 mg Oral Daily  . latanoprost  1 drop Both Eyes QHS  . levothyroxine  50 mcg Oral QAC breakfast  . lisinopril  10 mg Oral Daily  . multivitamin with minerals  1  tablet Oral QPM  . potassium chloride SA  20 mEq Oral Daily  :  Allergies  Allergen Reactions  . Heparin Anaphylaxis  . Codeine Other (See Comments)    Unknown   :  Family History  Problem Relation Age of Onset  . Irregular heart beat Mother   . Other Mother     pellegra  . Heart failure Mother     had very swollen legs??  . Stroke Mother   . Prostate cancer Father     also skin cancer  . Heart attack Daughter   . Heart disease Brother   . Heart attack Brother 63    still alive  :  Social History   Social History  .  Marital status: Widowed    Spouse name: N/A  . Number of children: 2  . Years of education: 12   Occupational History  . Not on file.   Social History Main Topics  . Smoking status: Never Smoker  . Smokeless tobacco: Never Used  . Alcohol use No  . Drug use: No  . Sexual activity: Not on file   Other Topics Concern  . Not on file   Social History Narrative   Two daughters, [name] and [name]  Lives alone in a house with some steps near the mailbox, but otherwise single story.  Does not use a cane or walker or other assist device.  Active and gardens a lot.  Completes ADLS without assistance.    :  Pertinent items are noted in HPI.  Exam: Patient Vitals for the past 24 hrs:  BP Temp Temp src Pulse Resp SpO2 Height Weight  07/16/16 1000 (!) 139/50 - - 68 - - - -  07/16/16 0547 (!) 127/58 97.9 F (36.6 C) Oral 65 16 95 % - -  07/15/16 2030 (!) 118/47 99.5 F (37.5 C) Oral 72 16 96 % - -  07/15/16 1836 - - - - - - 5' 2"  (1.575 m) 120 lb 11.2 oz (54.7 kg)  07/15/16 1434 (!) 125/35 98.9 F (37.2 C) Oral 78 16 99 % - -   As above    Recent Labs  07/15/16 0555 07/16/16 0347  WBC 5.4 5.8  HGB 10.2* 10.3*  HCT 31.0* 30.2*  PLT 122* 129*    Recent Labs  07/14/16 0233 07/16/16 0347  NA 137 138  K 3.5 3.5  CL 105 106  CO2 24 26  GLUCOSE 93 90  BUN 16 22*  CREATININE 0.85 0.98  CALCIUM 8.8* 8.6*    Blood smear review:   None  Pathology: None     Assessment and Plan:  Amanda Richardson is a very charming 83 year old white female. She fell. She sustained a large left hematoma in the hip. This is improving. Her blood count is holding pretty steady.  I believe that cardiology is absolutely correct him and saying there is no alternative for. Because she has this mechanical heartfelt, it is not approved by the FDA to use one of the new oral anticoagulants.  I suppose that one might be thinking about Lovenox or Arixtra. However these are injections. I would think that this is something that would be very detrimental to her quality of life.  I think that given that she fell. This would not be considered a spontaneous bleed.  The key now is to prevent her from falling. The problem is that she lives by herself. She wants to be very independent. Not sure how amenable she would be  to using a walker or wheelchair.  I know that given that this is a mitral valve, the INR needs to be a little bit higher. She is that since her risk for cerebrovascular event if she is off anticoagulation or she is subtherapeutic.  I really cannot add any more than what cardiology has recommended. As always, cardiology does a very thorough job in their recommendations and assessments.  Amanda Richardson does have a strong faith. We talked about this and we had a little prayer together. She very much appreciated this.  For now, I will sign off.  I see that she has the mild anemia and thrombocytopenia. The MCV is not high. I suppose this might be some underlying myelodysplasia. Given  her age, she could easily have some myelodysplasia. I don't believe that a bone marrow biopsy is indicated.  Lattie Haw, MD  Hebrews 12:12

## 2016-07-16 NOTE — Consult Note (Signed)
Cardiology Consultation Note    Patient ID: Amanda Richardson, MRN: [id number], DOB/AGE: 1924-03-03 83 y.o. Admit date: 07/13/2016   Date of Consult: 07/16/2016 Primary Physician: Merrilee Seashore, MD Primary Cardiologist: Dr. [name]  Chief Complaint: fall Reason for Consultation: hip hematoma, on chronic anticoagulation for mechanical MVR Requesting MD: Dr. Charlies Silvers  HPI: Amanda Richardson is a 83 y.o. female with history of mechanical MVR 2001 by Dr. Cyndia Bent on Coumadin PTA, CAD s/p CABG at time of MVR (details unknown), chronic atrial fib, chronic venous lower extremity edema, chronic diastolic CHF, prior strokes seen on CT, prior left nephrectomy, prior ischemic bowel s/p surgery 2004, hypothyroidism, possible DVT 1990s, chronic-appearing anemia and thrombocytopenia, bullous pemphigoid, prior fall with left hip fx 11/2015 who presented to [facility] 07/13/16 with mechanical fall.   She was in her USOH the morning of admission having breakfast at the kitchen table. She got up out of the chair to turn around and tripped and fell. She laid on the floor for approximately 2-3 hours before her daughter was back to check on her. EMS was activated 2/2 patient's weakness and inability to get up. Hip film neg for fx but she was found to have a left hip hematoma. Hgb in 10 range but c/w prior along with mild thrombocytopenia. LE duplex for lower extremity edema (chronic for patient) neg for DVT. Coumadin was held and she was started on bivalrudin due to h/o heparin allergy. Dr. Marin Olp has been called with hematology and is requesting cardiology follow. VSS. Labs showed Hgb 10.9 (c/w prior), plt 130 (c/w prior), Cr 0.92, CK 77, INR 1.83. Last echo 2015: EF 55-60%, mod AI, mild MR, massively dilated LA, severely dilated LAA, mod dil RV, mod-severely dilated RA, severely dilated RAA. CHADSVASC 8 (CHF, HTN, age, occult strokes on CT, vascular dz, female).  Past Medical History:  Diagnosis Date  . Allergic rhinitis   .  Anemia    not applicable since GGE-36O when pt had hysterectomy   . Basal cell carcinoma   . Bilateral lower extremity edema    history of venous insufficiency  . Bullous pemphigoid   . Chronic anticoagulation   . Chronic atrial fibrillation ([place])   . Chronic diastolic CHF (congestive heart failure) ([place])   . Coronary artery disease    a. CABG 2001 at time of MVR.  Marland Kitchen DVT (deep venous thrombosis) ([place])    possible in 1990s and does not remember being on blood thinner  . Glaucoma   . H/O mitral valve replacement 2001   mechanical heart valve; family states indication may have been MR from MVP or  rheumatic heart disease  . Hypertension   . Hypothyroidism   . 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 S/p nephrectomy    left  . Stroke [facility])    a. CT head 2018 showed old left anterior watershed infarct, old right caudate lacunar infarct.  . Thrombocytopenia ([place])   . Urinary incontinence       Surgical History:  Past Surgical History:  Procedure Laterality Date  . ABDOMINAL HYSTERECTOMY  1960s  . APPENDECTOMY  1960s  . CORONARY ARTERY BYPASS GRAFT  01/2000  . HERNIA REPAIR    . HIP ARTHROPLASTY Left 12/18/2015   Procedure: HEMIARTHROPLASTY  HIP;  Surgeon: Tania Ade, MD;  Location: [place];  Service: Orthopedics;  Laterality: Left;  . hx broken collarbone    . LAPAROSCOPIC SMALL BOWEL RESECTION  2004 or 5  adhesions and ischemic bowel  . MITRAL VALVE REPLACEMENT  01/2000   mechanical heart valve  . TONSILLECTOMY  1933  . TRANSTHORACIC ECHOCARDIOGRAM  02/2012   EF 60-65%, mild LVH; mildly thickened AV leaflets with sclerosis and moderate regurg; mechanical MV prosthesis; LA dilated; RVSP increased; RA severely dilated; increased LA pressure; moderate TR     Home Meds: Prior to Admission medications   Medication Sig Start Date End Date Taking? Authorizing Provider  atenolol (TENORMIN) 50 MG tablet Take 50 mg by mouth daily.   Yes Historical Provider, MD    calcium-vitamin D (OSCAL WITH D) 500-200 MG-UNIT per tablet Take 1 tablet by mouth every evening.    Yes Historical Provider, MD  Cholecalciferol 2000 units CAPS Take 1 capsule by mouth every evening.   Yes Historical Provider, MD  clobetasol ointment (TEMOVATE) 2.72 % Apply 1 application topically 2 (two) times daily as needed (blisters for bulous pemphigoid).    Yes Historical Provider, MD  digoxin (LANOXIN) 0.125 MG tablet Take 0.0625 mg by mouth daily.    Yes Historical Provider, MD  furosemide (LASIX) 40 MG tablet Take 1 tablet (40 mg total) by mouth daily. 08/11/14  Yes Mutaz Elmahi, MD  KLOR-CON M20 20 MEQ tablet Take 1 tablet by mouth daily.  08/22/14  Yes Historical Provider, MD  latanoprost (XALATAN) 0.005 % ophthalmic solution Place 1 drop into both eyes at bedtime.    Yes Historical Provider, MD  levothyroxine (SYNTHROID, LEVOTHROID) 50 MCG tablet Take 50 mcg by mouth daily. 05/05/14  Yes Historical Provider, MD  lisinopril (PRINIVIL,ZESTRIL) 10 MG tablet Take 10 mg by mouth daily.  03/27/14  Yes Historical Provider, MD  Multiple Vitamin (MULTIVITAMIN WITH MINERALS) TABS Take 1 tablet by mouth every evening.    Yes Historical Provider, MD  niacinamide 500 MG tablet Take 500 mg by mouth 2 (two) times daily with a meal.   Yes Historical Provider, MD  Probiotic Product (PROBIOTIC PO) Take 1 capsule by mouth every other day.   Yes Historical Provider, MD  warfarin (COUMADIN) 3 MG tablet Take 1.5-3 mg by mouth See admin instructions. Take 1/2 tablet on Monday then take 1 tablet all the other days   Yes Historical Provider, MD  senna-docusate (SENOKOT-S) 8.6-50 MG tablet Take 1 tablet by mouth 2 (two) times daily. Patient not taking: Reported on 07/13/2016 12/21/15   Domenic Polite, MD    Inpatient Medications:  . atenolol  50 mg Oral Daily  . calcium-vitamin D  1 tablet Oral QPM  . cholecalciferol  2,000 Units Oral QPM  . digoxin  0.0625 mg Oral Daily  . furosemide  40 mg Oral Daily  .  latanoprost  1 drop Both Eyes QHS  . levothyroxine  50 mcg Oral QAC breakfast  . lisinopril  10 mg Oral Daily  . multivitamin with minerals  1 tablet Oral QPM  . potassium chloride SA  20 mEq Oral Daily   . bivalirudin (ANGIOMAX) infusion 0.5 mg/mL (Non-ACS indications) 0.1 mg/kg/hr (07/16/16 0106)    Allergies:  Allergies  Allergen Reactions  . Heparin Anaphylaxis  . Codeine Other (See Comments)    Unknown     Social History   Social History  . Marital status: Widowed    Spouse name: N/A  . Number of children: 2  . Years of education: 12   Occupational History  . Not on file.   Social History Main Topics  . Smoking status: Never Smoker  . Smokeless tobacco: Never Used  .  Alcohol use No  . Drug use: No  . Sexual activity: Not on file   Other Topics Concern  . Not on file   Social History Narrative   Two daughters, [name] and [name]  Lives alone in a house with some steps near the mailbox, but otherwise single story.  Does not use a cane or walker or other assist device.  Active and gardens a lot.  Completes ADLS without assistance.       Family History  Problem Relation Age of Onset  . Irregular heart beat Mother   . Other Mother     pellegra  . Heart failure Mother     had very swollen legs??  . Stroke Mother   . Prostate cancer Father     also skin cancer  . Heart attack Daughter   . Heart disease Brother   . Heart attack Brother 25    still alive     Review of Systems: All other systems reviewed and are otherwise negative except as noted above.  Labs:  Recent Labs  07/13/16 1758  CKTOTAL 77   Lab Results  Component Value Date   WBC 5.8 07/16/2016   HGB 10.3 (L) 07/16/2016   HCT 30.2 (L) 07/16/2016   MCV 95.3 07/16/2016   PLT 129 (L) 07/16/2016    Recent Labs Lab 07/16/16 0347  NA 138  K 3.5  CL 106  CO2 26  BUN 22*  CREATININE 0.98  CALCIUM 8.6*  GLUCOSE 90   No results found for: CHOL, HDL, LDLCALC, TRIG No results found  for: DDIMER  Radiology/Studies:  Ct Head Wo Contrast  Result Date: 07/13/2016 CLINICAL DATA:  Dizziness prior to a fall today at home. EXAM: CT HEAD WITHOUT CONTRAST TECHNIQUE: Contiguous axial images were obtained from the base of the skull through the vertex without intravenous contrast. COMPARISON:  08/08/2014. 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 Mildly progressive diffuse enlargement of the ventricles and subarachnoid spaces. Patchy white matter low density in both cerebral hemispheres. Old right caudate head lacunar infarct. No intracranial hemorrhage, mass lesion or CT evidence of acute infarction. Vascular: No hyperdense vessel or unexpected calcification. Skull: Normal. Negative for fracture or focal lesion. Sinuses/Orbits: No acute finding. Other: None. IMPRESSION: 1. No acute abnormality. 2. 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 3. Stable old right caudate head lacunar infarct. 4. Mildly progressive atrophy and chronic small vessel white matter ischemic changes in both cerebral hemispheres. Electronically Signed   By: Claudie Revering M.D.   On: 07/13/2016 17:09   Dg Hip Unilat With Pelvis 2-3 Views Left  Result Date: 07/13/2016 CLINICAL DATA:  Left hip pain following a fall today. EXAM: DG HIP (WITH OR WITHOUT PELVIS) 2-3V LEFT COMPARISON:  12/17/2015. FINDINGS: Interval left femoral head prosthesis. No fracture or dislocation seen. Diffuse osteopenia. IMPRESSION: No fracture or dislocation. Electronically Signed   By: Claudie Revering M.D.   On: 07/13/2016 17:05    Wt Readings from Last 3 Encounters:  07/15/16 120 lb 11.2 oz (54.7 kg)  01/11/16 133 lb (60.3 kg)  01/03/16 133 lb (60.3 kg)    EKG: Atrial fib 99bpm right axis deviation, old anteroseptal infarct, diffuse nonspecific ST-T changes with TWI inferiorly and V4-V6 slightly accentuated from prior.  Physical Exam: Blood  pressure (!) 139/50, pulse 68, temperature 97.9 F (36.6 C), temperature source Oral, resp. rate 16, height 5\' 2"  (1.575 m), weight 120 lb 11.2  oz (54.7 kg), SpO2 95 %. Body mass index is 22.08 kg/m. General: Frail cachetic appearing WF, in no acute distress. Head: Normocephalic, atraumatic, sclera non-icteric, no xanthomas, nares are without discharge.  Neck: JVD not elevated. Lungs: Clear bilaterally to auscultation without wheezes, rales, or rhonchi. Breathing is unlabored. Heart: Irregularly irregular with S1 S2. No murmurs, rubs, or gallops appreciated. Abdomen: Soft, non-tender, non-distended with normoactive bowel sounds. No hepatomegaly. No rebound/guarding. No obvious abdominal masses. Msk:  Strength and tone appear normal for age. Extremities: No clubbing or cyanosis. Trace bilateral LE edema.  Distal pedal pulses are 2+ and equal bilaterally. Left hip hematoma encircled, not encroaching further beyond marked line Neuro: Alert and oriented X 3. No facial asymmetry. No focal deficit. Moves all extremities spontaneously. Psych:  Responds to questions appropriately with a normal affect.     Assessment and Plan  77F with mechanical MVR 2001 by Dr. Cyndia Bent on Coumadin PTA, CAD s/p CABG at time of MVR (graft details unknown), chronic atrial fib, chronic venous lower extremity edema, chronic diastolic CHF, prior strokes seen on CT, prior left nephrectomy, prior ischemic bowel s/p surgery 2004, hypothyroidism, possible DVT 1990s, chronic-appearing anemia and thrombocytopenia, bullous pemphigoid, prior fall with left hip fx 11/2015 who presented to [facility] 07/13/16 with mechanical fall.   1. Mechanical fall complicated by left hip hematoma - the patient relays a history of 4 falls in the past few years including this one and the one in 2017 that required hip replacement. She has proved herself a challenging anticoagulation candidate but unfortunately her valve replacement necessitates ongoing  anticoagulation or she remains at extremely high risk of stroke. Regarding her atrial fib, additionally, she has a CHADSVASC of 8 and prior evidence of strokes on CT this admission. I will review further with Dr. Percival Spanish. Although it would likely lead to progressive debility, she may be safer moving about in a wheelchair. There is unfortunately not an easy answer in this situation. I will review further with MD.  2. Mechanical MVR - as above.  3. Chronic atrial fib - rate controlled. Continue home regimen. There is increased risk in elderly patients with digoxin but in her acute state her HR is well controlled and we could just observe for now. Would recommend telemetry to ensure no pauses or bradycardia contributing to weakness.  4. CAD - denies recent anginal symptoms. No aspirin given need for warfarin.   5. Chronic diastolic CHF - appears euvolemic.  Signed, Charlie Pitter PA-C 07/16/2016, 11:01 AM Pager: [phone]  History and all data above reviewed.  Patient examined.  I agree with the findings as above.  She is frail but without pain.  She has mechanical falls but she is less than cautious at home.  She doesn't use her walkers.  She does will bend to pick things up.  The patient exam reveals [id number] with mechanical S1  ,  Lungs: Clear  ,  Abd: Positive bowel sounds, no rebound no guarding, Ext left him large hematoma, mild left greater than right edema  .  All available labs, radiology testing, previous records reviewed. Agree with documented assessment and plan. Discussed with the primary team.  She has no alternative to warfarin.  She will need to be on therapeutic warfarin prior to discontinuation of bivalirudin.  The timing of restarting the warfarin will be art rather than science.  If she has had a stable Hgb and the size of the hematoma is stable for a couple of days it is  probably reasonable to restart the warfarin.   The transition off of bival typically requires an INR of about 3  to indicate therapeutic anticoagulation but I would defer to hematology and pharmacy nomograms for completing this transition.   Jeneen Rinks Haylee Mcanany  1:06 PM  07/16/2016

## 2016-07-16 NOTE — Progress Notes (Signed)
[facility]                                                                              Patient Demographics  Amanda Richardson, is a 83 y.o. female, DOB - 1923/04/26, XFG:[id number]  Admit date - 07/13/2016   Admitting Physician Orson Eva, MD  Outpatient Primary MD for the patient is [facility], MD  Outpatient specialists:   LOS - 2  days    Chief Complaint  Patient presents with  . Fall  . Hip Pain       Brief summary   83 y.o.femalewith medical history significant for atrial fibrillation on coumadin, bullous pemphigoid, hypertension, hypothyroidism who presented to [facility] status post mechanical fall the day of the admission.The patient had unwitnessed fall while she was walking and she fell on her left side. There was no loss of consciousness. The patient did not have any significant pain, but was too weak to get up. She laid on the floor for approximately 2-3 hours prior to her daughter coming back to check up on her. EMS was activated secondary to the patient's weakness and inability to get up.    Assessment & Plan    Principal problem Mechanical fall complicated with left thigh/hip hematoma: -  No acute fractures seen on hip x ray. Patient had a fall in August 2017 requiring left hip hemiarthroplasty. Now fall this admission however no fracture that had large hematoma. - Coumadin was placed on hold, patient has a heparin allergy, hence was placed on IV bivalirudin. Unfortunately disposition is challenging in this patient given large hematoma and mechanical mitral valve necessitating anticoagulation. - I have requested hematology consult, discussed with Dr. Marin Olp if there are any subcutaneous options for disposition for bridging and I have also requested cardiology consultation.   Active problems History of mechanical mitral valve replacement 2001 - Holding Coumadin due to hematoma  - Started patient on IV bivalirudin (heparin allergy)  Chronic  atrial fibrillation - CHADS vasc score at 20 (age, gender, hypertension, CHF) - Coumadin on hold due to hematoma but pt currently on IV angiomax - Continue digoxin 0.625 mg daily, atenolol for rate control  Chronic diastolic CHF - Compensated - 2 D ECHO in 2016 with normal EF  Hypertension,essential  - Continue atenolol and lisinopril  Hypothyroidism - Continue synthroid   Chronic lower extremity edema -Venous duplex left lower extremity due to worsening left lower extremity edema negative for DVT -Continue compression stockings  Bullous pemphigoid - Continue Temovate, niacinamide   Thrombocytopenia -Improving, chronic, monitor in the setting of anticoagulation    Code Status: Full CODE STATUS DVT Prophylaxis:  On IV  bivalirudin. Family Communication: Discussed in detail with the patient, all imaging results, lab results explained to the patient and his son-in-law at the bedside in detail  PT evaluation also pending her son-in-law, patient lives at home alone and with recent falls, they are also concerned about patient's safety   Disposition Plan: Awaiting hematology and cardiology recommendations  Time Spent in minutes   25  minutes  Procedures:    Consultants:   Hematology Cardiology  Antimicrobials:      Medications  Scheduled Meds: . atenolol  50 mg Oral Daily  . calcium-vitamin D  1 tablet Oral QPM  . cholecalciferol  2,000 Units Oral QPM  . digoxin  0.0625 mg Oral Daily  . furosemide  40 mg Oral Daily  . latanoprost  1 drop Both Eyes QHS  . levothyroxine  50 mcg Oral QAC breakfast  . lisinopril  10 mg Oral Daily  . multivitamin with minerals  1 tablet Oral QPM  . potassium chloride SA  20 mEq Oral Daily   Continuous Infusions: . bivalirudin (ANGIOMAX) infusion 0.5 mg/mL (Non-ACS indications) 0.1 mg/kg/hr (07/16/16 0106)   PRN Meds:.acetaminophen **OR** acetaminophen, clobetasol ointment, ondansetron **OR** ondansetron (ZOFRAN)  IV   Antibiotics   Anti-infectives    None        [name] was seen and examined today.  Patient denies dizziness, chest pain, shortness of breath, abdominal pain, N/V/D/C, new weakness, numbess, tingling. No acute events overnight.  Hematoma slightly improving on the left thigh  Objective:   Vitals:   07/15/16 1836 07/15/16 2030 07/16/16 0547 07/16/16 1000  BP:  (!) 118/47 (!) 127/58 (!) 139/50  Pulse:  72 65 68  Resp:  16 16   Temp:  99.5 F (37.5 C) 97.9 F (36.6 C)   TempSrc:  Oral Oral   SpO2:  96% 95%   Weight: 54.7 kg (120 lb 11.2 oz)     Height: 5\' 2"  (1.575 m)       Intake/Output Summary (Last 24 hours) at 07/16/16 1149 Last data filed at 07/15/16 1434  Gross per 24 hour  Intake              240 ml  Output                0 ml  Net              240 ml     Wt Readings from Last 3 Encounters:  07/15/16 54.7 kg (120 lb 11.2 oz)  01/11/16 60.3 kg (133 lb)  01/03/16 60.3 kg (133 lb)     Exam  General: Alert and oriented x 3, NAD  HEENT:  PERRLA, EOMI, Anicteric Sclera, mucous membranes moist.   Neck: Supple, no JVD, no masses  Cardiovascular: S1 S2 auscultated, no rubs, murmurs or gallops. Regular rate and rhythm.  Respiratory: Clear to auscultation bilaterally, no wheezing, rales or rhonchi  Gastrointestinal: Soft, nontender, nondistended, + bowel sounds  Ext: no cyanosis clubbing or edema  Neuro: AAOx3, Cr N's II- XII. Strength 5/5 upper and lower extremities bilaterally  Skin: No rashes, hematoma on the left thigh improving, still fairly large about 10 x 6 cm  Psych: Normal affect and demeanor, alert and oriented x3    Data Reviewed:  I have personally reviewed following labs and imaging studies  Micro Results No results found for this or any previous visit (from the past 240 hour(s)).  Radiology Reports Ct Head Wo Contrast  Result Date: 07/13/2016 CLINICAL DATA:  Dizziness prior to a fall today at home. EXAM: CT  HEAD WITHOUT CONTRAST TECHNIQUE: Contiguous axial images were obtained from the base of the skull through the vertex without intravenous contrast. COMPARISON:  08/08/2014. 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 Mildly progressive diffuse enlargement of the ventricles and subarachnoid spaces. Patchy white matter low density in both cerebral hemispheres. Old right caudate head lacunar infarct. No intracranial hemorrhage, mass  lesion or CT evidence of acute infarction. Vascular: No hyperdense vessel or unexpected calcification. Skull: Normal. Negative for fracture or focal lesion. Sinuses/Orbits: No acute finding. Other: None. IMPRESSION: 1. No acute abnormality. 2. 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 3. Stable old right caudate head lacunar infarct. 4. Mildly progressive atrophy and chronic small vessel white matter ischemic changes in both cerebral hemispheres. Electronically Signed   By: Claudie Revering M.D.   On: 07/13/2016 17:09   Dg Hip Unilat With Pelvis 2-3 Views Left  Result Date: 07/13/2016 CLINICAL DATA:  Left hip pain following a fall today. EXAM: DG HIP (WITH OR WITHOUT PELVIS) 2-3V LEFT COMPARISON:  12/17/2015. FINDINGS: Interval left femoral head prosthesis. No fracture or dislocation seen. Diffuse osteopenia. IMPRESSION: No fracture or dislocation. Electronically Signed   By: Claudie Revering M.D.   On: 07/13/2016 17:05    Lab Data:  CBC:  Recent Labs Lab 07/13/16 1758 07/13/16 2150 07/14/16 0611 07/15/16 0555 07/16/16 0347  WBC 5.5 6.2 5.0 5.4 5.8  NEUTROABS 3.8  --   --   --   --   HGB 10.9* 10.5* 10.5* 10.2* 10.3*  HCT 32.9* 31.8* 31.1* 31.0* 30.2*  MCV 94.5 94.4 94.5 93.9 95.3  PLT 130* 125* 119* 122* 299*   Basic Metabolic Panel:  Recent Labs Lab 07/13/16 1758 07/14/16 0233 07/16/16 0347  NA 139 137 138  K 3.7 3.5 3.5  CL 107 105 106  CO2 25 24 26   GLUCOSE  107* 93 90  BUN 19 16 22*  CREATININE 0.92 0.85 0.98  CALCIUM 9.1 8.8* 8.6*   GFR: Estimated Creatinine Clearance: 29.6 mL/min (by C-G formula based on SCr of 0.98 mg/dL). Liver Function Tests: No results for input(s): AST, ALT, ALKPHOS, BILITOT, PROT, ALBUMIN in the last 168 hours. No results for input(s): LIPASE, AMYLASE in the last 168 hours. No results for input(s): AMMONIA in the last 168 hours. Coagulation Profile:  Recent Labs Lab 07/13/16 2150 07/14/16 0233 07/15/16 0555 07/16/16 0347  INR 1.83 1.86 2.37 1.90   Cardiac Enzymes:  Recent Labs Lab 07/13/16 1758  CKTOTAL 77   BNP (last 3 results) No results for input(s): PROBNP in the last 8760 hours. HbA1C: No results for input(s): HGBA1C in the last 72 hours. CBG: No results for input(s): GLUCAP in the last 168 hours. Lipid Profile: No results for input(s): CHOL, HDL, LDLCALC, TRIG, CHOLHDL, LDLDIRECT in the last 72 hours. Thyroid Function Tests: No results for input(s): TSH, T4TOTAL, FREET4, T3FREE, THYROIDAB in the last 72 hours. Anemia Panel: No results for input(s): VITAMINB12, FOLATE, FERRITIN, TIBC, IRON, RETICCTPCT in the last 72 hours. Urine analysis:    Component Value Date/Time   COLORURINE YELLOW 07/13/2016 2013   APPEARANCEUR CLEAR 07/13/2016 2013   LABSPEC 1.011 07/13/2016 2013   PHURINE 7.0 07/13/2016 2013   GLUCOSEU NEGATIVE 07/13/2016 2013   HGBUR MODERATE (A) 07/13/2016 2013   BILIRUBINUR NEGATIVE 07/13/2016 2013   KETONESUR NEGATIVE 07/13/2016 2013   PROTEINUR NEGATIVE 07/13/2016 2013   UROBILINOGEN 4.0 (H) 08/04/2014 1608   NITRITE NEGATIVE 07/13/2016 2013   LEUKOCYTESUR NEGATIVE 07/13/2016 2013     RAI,RIPUDEEP M.D. [facility] 07/16/2016, 11:49 AM  Pager: [phone] Between 7am to 7pm - call Pager - [phone]  After 7pm go to [email or web address] - password [email or web address]  Call night coverage person covering after 7pm

## 2016-07-16 NOTE — Evaluation (Signed)
Occupational Therapy Evaluation Patient Details Name: Amanda Richardson MRN: [id number] DOB: 06-23-1923 Today's Date: 07/16/2016    History of Present Illness Pt is a 83 yo female admitted through ED following a fall on 07/13/16 resulting in a left hip hematoma with no fracture noted. Pt had a fall in 8/17 requiring a hemiarthroplasty but has no reported falls since. PMH significant for HTN, A-fib, Hypothyroidism, and chronic LE edema.   Clinical Impression   Pt reports she was managing BADL at mod I level PTA. Currently pt overall min guard for functional mobility and ADL with the exception of min assist for LB ADL. Pt planning to d/c home with 24/7 supervision from family. Recommending HHOT and 24/7 supervision for follow up to maximize independence and safety with ADL and functional mobility upon return home. Pt would benefit from continued skilled OT to address established goals.    Follow Up Recommendations  Home health OT;Supervision/Assistance - 24 hour    Equipment Recommendations  None recommended by OT    Recommendations for Other Services       Precautions / Restrictions Precautions Precautions: Fall Restrictions Weight Bearing Restrictions: No      Mobility Bed Mobility               General bed mobility comments: Pt OOB in chair upon arrival  Transfers Overall transfer level: Needs assistance Equipment used: Rolling walker (2 wheeled) Transfers: Sit to/from Stand Sit to Stand: Min assist         General transfer comment: Min assist to boost up from chair. Good hand placement and technique with RW    Balance Overall balance assessment: Needs assistance Sitting-balance support: No upper extremity supported;Feet supported Sitting balance-Leahy Scale: Good     Standing balance support: No upper extremity supported;During functional activity Standing balance-Leahy Scale: Fair Standing balance comment: Able to stand and brush hair without UE support                           ADL either performed or assessed with clinical judgement   ADL Overall ADL's : Needs assistance/impaired Eating/Feeding: Set up;Sitting   Grooming: Min guard;Standing;Brushing hair   Upper Body Bathing: Set up;Supervision/ safety;Sitting   Lower Body Bathing: Minimal assistance;Sit to/from stand   Upper Body Dressing : Set up;Supervision/safety;Sitting   Lower Body Dressing: Minimal assistance;Sit to/from stand   Toilet Transfer: Min guard;Ambulation;BSC;RW Toilet Transfer Details (indicate cue type and reason): Simulated by sit to stand from chair with functional mobility in room         Functional mobility during ADLs: Min guard;Rolling walker       Vision         Perception     Praxis      Pertinent Vitals/Pain Pain Assessment: No/denies pain     Hand Dominance Right   Extremity/Trunk Assessment Upper Extremity Assessment Upper Extremity Assessment: Generalized weakness   Lower Extremity Assessment Lower Extremity Assessment: Defer to PT evaluation   Cervical / Trunk Assessment Cervical / Trunk Assessment: Kyphotic   Communication Communication Communication: HOH   Cognition Arousal/Alertness: Awake/alert Behavior During Therapy: WFL for tasks assessed/performed Overall Cognitive Status: Within Functional Limits for tasks assessed                                     General Comments       Exercises  Shoulder Instructions      Home Living Family/patient expects to be discharged to:: Private residence Living Arrangements: Alone Available Help at Discharge: Family;Available 24 hours/day Type of Home: House Home Access: Stairs to enter [employer] of Steps: 2 Entrance Stairs-Rails: Right Home Layout: One level     Bathroom Shower/Tub: Occupational psychologist: Standard     Home Equipment: Environmental consultant - 2 wheels;Cane - single point;Bedside commode;Shower seat;Grab bars -  toilet;Grab bars - tub/shower;Hand held shower head   Additional Comments: daughters planning to come stay with pt 24/7 per pt report      Prior Functioning/Environment Level of Independence: Needs assistance;Independent with assistive device(s)  Gait / Transfers Assistance Needed: walking with a cane inside home without assistance, transferring alone ADL's / [place] Needed: needs assistance for bathing, but can do bird bath, family assists with shopping   Comments: family is available to help as needed         OT Problem List: Decreased strength;Impaired balance (sitting and/or standing);Decreased knowledge of use of DME or AE;Increased edema      OT Treatment/Interventions: Self-care/ADL training;Energy conservation;DME and/or AE instruction;Therapeutic activities;Patient/family education;Balance training    OT Goals(Current goals can be found in the care plan section) Acute Rehab OT Goals Patient Stated Goal: go home OT Goal Formulation: With patient Time For Goal Achievement: 07/30/16 Potential to Achieve Goals: Good ADL Goals Pt Will Perform Lower Body Bathing: with supervision;sit to/from stand Pt Will Perform Lower Body Dressing: with supervision;sit to/from stand Pt Will Transfer to Toilet: with supervision;ambulating;bedside commode (over toilet) Pt Will Perform Toileting - Clothing Manipulation and hygiene: with supervision;sit to/from stand Pt Will Perform Tub/Shower Transfer: Shower transfer;with supervision;ambulating;shower seat;rolling walker  OT Frequency: Min 2X/week   Barriers to D/C:            Co-evaluation              End of Session Equipment Utilized During Treatment: Gait belt;Rolling walker  Activity Tolerance: Patient tolerated treatment well Patient left: Other (comment) (in hallway with PTA)  OT Visit Diagnosis: Unsteadiness on feet (R26.81);History of falling (Z91.81)                Time: 0881-1031 OT Time Calculation  (min): 10 min Charges:  OT General Charges $OT Visit: 1 Procedure OT Evaluation $OT Eval Moderate Complexity: 1 Procedure G-Codes:     Hawk Mones A. Ulice Brilliant, M.S., OTR/L Pager: [phone] 07/16/2016, 2:36 PM

## 2016-07-16 NOTE — Progress Notes (Signed)
ANTICOAGULATION CONSULT NOTE - FOLLOW UP  Pharmacy Consult:  Bivalirudin Indication:  History of mechanical MVR and Afib  Allergies  Allergen Reactions  . Heparin Anaphylaxis  . Codeine Other (See Comments)    Unknown     Patient Measurements: Height = 5'2" Weight = 54.7 kg  Vital Signs: Temp: 97.9 F (36.6 C) (03/25 0547) Temp Source: Oral (03/25 0547) BP: 127/58 (03/25 0547) Pulse Rate: 65 (03/25 0547)  Labs:  Recent Labs  07/13/16 1758  07/14/16 0233 07/14/16 9021 07/14/16 0830 07/15/16 0555 07/16/16 0347  HGB 10.9*  < >  --  10.5*  --  10.2* 10.3*  HCT 32.9*  < >  --  31.1*  --  31.0* 30.2*  PLT 130*  < >  --  119*  --  122* 129*  APTT  --   < > 36 70* 72* 75* 64*  LABPROT  --   < > 21.7*  --   --  26.3* 22.0*  INR  --   < > 1.86  --   --  2.37 1.90  CREATININE 0.92  --  0.85  --   --   --  0.98  CKTOTAL 77  --   --   --   --   --   --   < > = values in this interval not displayed.  Estimated Creatinine Clearance: 29.6 mL/min (by C-G formula based on SCr of 0.98 mg/dL).    Assessment: 18 YOF with history of mechanical AVR in 2001 and Afib on Coumadin PTA.  She presented s/p fall resulting in a large left hip hematoma.  Coumadin was placed on hold and patient was transitioned to Angiomax given heparin allergy.  APTT therapeutic although it trended down.  Patient's INR is elevated due to the effect of Angiomax; it does not represent anticoagulation from Coumadin.  CBC is stable.   Goal of Therapy:  INR 2.5-3.5 PTT 50-85 sec Monitor platelets by anticoagulation protocol: Yes    Plan:  Continue bivalirudin at 0.1 mg/kg/hr (= 6 mg/hr) Daily CBC and aPTT Monitor closely for hematoma expansion/reduction   Greenlee Ancheta D. Mina Marble, PharmD, BCPS Pager:  [phone] 07/16/2016, 7:13 AM

## 2016-07-16 NOTE — Progress Notes (Signed)
qPhysical Therapy Treatment Patient Details Name: Amanda Richardson MRN: [id number] DOB: Oct 03, 1923 Today's Date: 07/16/2016    History of Present Illness Pt is a 83 yo female admitted through ED following a fall on 07/13/16 resulting in a left hip hematoma with no fracture noted. Pt had a fall in 8/17 requiring a hemiarthroplasty but has no reported falls since. PMH significant for HTN, A-fib, Hypothyroidism, and chronic LE edema.    PT Comments    Patient continues to progress toward mobility goals. Continue to progress as tolerated with anticipated d/c home with HHPT and assistance from family.    Follow Up Recommendations  Home health PT     Equipment Recommendations  None recommended by PT    Recommendations for Other Services       Precautions / Restrictions Precautions Precautions: Fall Precaution Comments: Fall resulted in current hospitalization Restrictions Weight Bearing Restrictions: No    Mobility  Bed Mobility               General bed mobility comments: pt OOB in chair upon arrival  Transfers Overall transfer level: Needs assistance Equipment used: Rolling walker (2 wheeled) Transfers: Sit to/from Stand Sit to Stand: Min guard         General transfer comment: received ambulating with OT beginning of session; min guard for safety; pt with good hand placement when descending to recliner  Ambulation/Gait Ambulation/Gait assistance: Min guard Ambulation Distance (Feet): 100 Feet Assistive device: Rolling walker (2 wheeled) Gait Pattern/deviations: Step-through pattern;Trunk flexed;Narrow base of support Gait velocity: decreased   General Gait Details: cues for posture/forward  gaze; pt with slow, steady gait; kyphotic posture   Stairs Stairs: Yes   Stair Management: Two rails;Step to pattern;Forwards Number of Stairs: 2 General stair comments: cues for sequencing and technique  Wheelchair Mobility    Modified Rankin (Stroke Patients  Only)       Balance Overall balance assessment: Needs assistance Sitting-balance support: No upper extremity supported;Feet supported Sitting balance-Leahy Scale: Good Sitting balance - Comments: sitting EOB with no back support   Standing balance support: Bilateral upper extremity supported;No upper extremity supported;During functional activity Standing balance-Leahy Scale: Fair Standing balance comment: Able to stand and brush hair without UE support                            Cognition Arousal/Alertness: Awake/alert Behavior During Therapy: WFL for tasks assessed/performed Overall Cognitive Status: Within Functional Limits for tasks assessed                                        Exercises      General Comments        Pertinent Vitals/Pain Pain Assessment: No/denies pain    Home Living Family/patient expects to be discharged to:: Private residence Living Arrangements: Alone Available Help at Discharge: Family;Available 24 hours/day Type of Home: House Home Access: Stairs to enter Entrance Stairs-Rails: Right Home Layout: One level Home Equipment: Walker - 2 wheels;Cane - single point;Bedside commode;Shower seat;Grab bars - toilet;Grab bars - tub/shower;Hand held shower head Additional Comments: daughters planning to come stay with pt 24/7 per pt report    Prior Function Level of Independence: Needs assistance;Independent with assistive device(s)  Gait / Transfers Assistance Needed: walking with a cane inside home without assistance, transferring alone ADL's / [place] Needed: needs assistance for bathing, but  can do bird bath, family assists with shopping Comments: family is available to help as needed    PT Goals (current goals can now be found in the care plan section) Acute Rehab PT Goals Patient Stated Goal: to get outside to pull her weeds PT Goal Formulation: With patient Time For Goal Achievement:  07/22/16 Potential to Achieve Goals: Good Progress towards PT goals: Progressing toward goals    Frequency    Min 5X/week      PT Plan Current plan remains appropriate    Co-evaluation             End of Session Equipment Utilized During Treatment: Gait belt Activity Tolerance: Patient tolerated treatment well Patient left: in chair;with call bell/phone within reach Nurse Communication: Mobility status PT Visit Diagnosis: Difficulty in walking, not elsewhere classified (R26.2)     Time: 2979-8921 PT Time Calculation (min) (ACUTE ONLY): 16 min  Charges:  $Gait Training: 8-22 mins                    G Codes:       [name], PTA Pager: [phone]     [name] 07/16/2016, 4:03 PM

## 2016-07-17 DIAGNOSIS — I5032 Chronic diastolic (congestive) heart failure: Secondary | ICD-10-CM

## 2016-07-17 DIAGNOSIS — Z952 Presence of prosthetic heart valve: Secondary | ICD-10-CM

## 2016-07-17 DIAGNOSIS — Z7901 Long term (current) use of anticoagulants: Secondary | ICD-10-CM

## 2016-07-17 DIAGNOSIS — Z951 Presence of aortocoronary bypass graft: Secondary | ICD-10-CM

## 2016-07-17 LAB — PROTIME-INR
INR: 1.76
PROTHROMBIN TIME: 20.8 s — AB (ref 11.4–15.2)

## 2016-07-17 LAB — CBC
HCT: 30.9 % — ABNORMAL LOW (ref 36.0–46.0)
HEMOGLOBIN: 10.3 g/dL — AB (ref 12.0–15.0)
MCH: 31.6 pg (ref 26.0–34.0)
MCHC: 33.3 g/dL (ref 30.0–36.0)
MCV: 94.8 fL (ref 78.0–100.0)
PLATELETS: 131 10*3/uL — AB (ref 150–400)
RBC: 3.26 MIL/uL — AB (ref 3.87–5.11)
RDW: 14.5 % (ref 11.5–15.5)
WBC: 5.9 10*3/uL (ref 4.0–10.5)

## 2016-07-17 LAB — APTT: APTT: 59 s — AB (ref 24–36)

## 2016-07-17 MED ORDER — WARFARIN - PHARMACIST DOSING INPATIENT
Freq: Every day | Status: DC
  Administered 2016-07-17 – 2016-07-22 (×2)
  Administered 2016-07-24: 1

## 2016-07-17 MED ORDER — WARFARIN SODIUM 5 MG PO TABS
5.0000 mg | ORAL_TABLET | Freq: Once | ORAL | Status: AC
  Administered 2016-07-17: 5 mg via ORAL
  Filled 2016-07-17: qty 1

## 2016-07-17 NOTE — Care Management Note (Signed)
Case Management Note  Patient Details  Name: Amanda Richardson MRN: [id number] Date of Birth: [birth year]  Subjective/Objective:  83 yr old female admitted with left hip hematoma.                   Action/Plan: Case manager spoke with patient and her daughters concerning discharge plan and DME needs. Choice for Home health agency was offered. Patient's daughter states they have used [facility] in the past and will do so now. CM called referral to Stevie Kern, [facility] Liaison. Patient will need youth walker and Wheelchair at discharge. Her daughter's state they have a system similar to [employer] installed for patient. She will have family support at discharge.      Expected Discharge Date:   07/19/16               Expected Discharge Plan:   Home with Home Health  In-House Referral:     Discharge planning Services     Post Acute Care Choice:  Home Health, Durable Medical Equipment Choice offered to:  Patient, Adult Children  DME Arranged:  [facility] youth, Wheelchair manual DME Agency:  [facility] Arranged:  PT, OT [place] Agency:  [facility]  Status of Service:  In process, will continue to follow  If discussed at Long Length of Stay Meetings, dates discussed:    Additional Comments:  [name], RN 07/17/2016, 10:15 AM

## 2016-07-17 NOTE — Progress Notes (Signed)
Progress Note  Patient Name: Amanda Richardson Date of Encounter: 07/17/2016  Primary Cardiologist: Dr. [name]   Subjective   Doing ok today. Pt is in good spirit. Still with tenderness over left hip but she reports this is improved. No chest pain or dyspnea.   Inpatient Medications    Scheduled Meds: . atenolol  50 mg Oral Daily  . calcium-vitamin D  1 tablet Oral QPM  . cholecalciferol  2,000 Units Oral QPM  . digoxin  0.0625 mg Oral Daily  . furosemide  40 mg Oral Daily  . latanoprost  1 drop Both Eyes QHS  . levothyroxine  50 mcg Oral QAC breakfast  . lisinopril  10 mg Oral Daily  . multivitamin with minerals  1 tablet Oral QPM  . potassium chloride SA  20 mEq Oral Daily  . warfarin  5 mg Oral ONCE-1800  . Warfarin - Pharmacist Dosing Inpatient   Does not apply q1800   Continuous Infusions: . bivalirudin (ANGIOMAX) infusion 0.5 mg/mL (Non-ACS indications) 0.1 mg/kg/hr (07/16/16 0106)   PRN Meds: acetaminophen **OR** acetaminophen, clobetasol ointment, ondansetron **OR** ondansetron (ZOFRAN) IV   Vital Signs    Vitals:   07/16/16 2107 07/17/16 0659 07/17/16 1009 07/17/16 1336  BP: (!) 138/56 (!) 141/59 132/62 (!) 125/57  Pulse: 65 65  (!) 53  Resp: 16 16  16   Temp: 98.5 F (36.9 C) 98.3 F (36.8 C)  98.1 F (36.7 C)  TempSrc: Oral Oral  Oral  SpO2: 99% 96%  100%  Weight:      Height:        Intake/Output Summary (Last 24 hours) at 07/17/16 1402 Last data filed at 07/17/16 1000  Gross per 24 hour  Intake              540 ml  Output                0 ml  Net              540 ml   Filed Weights   07/15/16 1836  Weight: 120 lb 11.2 oz (54.7 kg)    Telemetry    Atrial fibrillation with CVR - Personally Reviewed  ECG    Chronic atrial fibrillation - Personally Reviewed  Physical Exam   GEN: No acute distress.   Neck: No JVD Cardiac:irregularly irregular rhythm, tachy rate, no murmurs, rubs, or gallops.  Respiratory: Clear to auscultation  bilaterally. GI: Soft, nontender, non-distended  MS: No edema; + left hip hematoma on lateral aspect w/ mild tenderness Neuro:  Nonfocal  Psych: Normal affect   Labs    Chemistry Recent Labs Lab 07/13/16 1758 07/14/16 0233 07/16/16 0347  NA 139 137 138  K 3.7 3.5 3.5  CL 107 105 106  CO2 25 24 26   GLUCOSE 107* 93 90  BUN 19 16 22*  CREATININE 0.92 0.85 0.98  CALCIUM 9.1 8.8* 8.6*  GFRNONAA 53* 58* 49*  GFRAA >60 >60 57*  ANIONGAP 7 8 6      Hematology Recent Labs Lab 07/15/16 0555 07/16/16 0347 07/17/16 0308  WBC 5.4 5.8 5.9  RBC 3.30* 3.17* 3.26*  HGB 10.2* 10.3* 10.3*  HCT 31.0* 30.2* 30.9*  MCV 93.9 95.3 94.8  MCH 30.9 32.5 31.6  MCHC 32.9 34.1 33.3  RDW 14.6 14.9 14.5  PLT 122* 129* 131*    Cardiac EnzymesNo results for input(s): TROPONINI in the last 168 hours.  Recent Labs Lab 07/13/16 2158  TROPIPOC 0.02  BNPNo results for input(s): BNP, PROBNP in the last 168 hours.   DDimer No results for input(s): DDIMER in the last 168 hours.   Radiology    No results found.    Patient Profile     Amanda Richardson is a 83 y.o. female with history of mechanical MVR 2001 by Dr. Cyndia Bent on Coumadin PTA, CAD s/p CABG at time of MVR (details unknown), chronic atrial fib, chronic venous lower extremity edema, chronic diastolic CHF, prior strokes seen on CT, prior left nephrectomy, prior ischemic bowel s/p surgery 2004, hypothyroidism, possible DVT 1990s, chronic-appearing anemia and thrombocytopenia, bullous pemphigoid, prior fall with left hip fx 11/2015 who presented to [facility] 07/13/16 with mechanical fall. Hip film neg for fx but she was found to have a left hip hematoma. Coumadin was held and she was started on bivalrudin due to h/o heparin allergy.  Assessment & Plan     1. Mechanical Fall Complicated by Left Hip Hematoma: XR- ray negative for fracture. H/o recurrent falls, however given her mechanical valve and h/o Afib w/ CHA2DS2 VASc score of 8, including  prior strokes, she will need to remain on anticoagulation. Plan to  restart Coumadin with bivalrudin bridge (heparin allergy). Continue dosing per pharmacy.   2. Mechanical Aortic Valve: per above. Restart coumadin with bivalrudin bridge until INR is therapeutic.   3. Chronic Atrial Fibrillation: rate is controlled on current regimen>> atenolol + digoxin. CHA2DS2 VASc score Is 8. Continue anticoagulation as outlined above.   4. CAD: s/p CABG at time of MVR (graft details unknown). She denies any recent anginal symptoms. No ASA given need for Coumadin.   5. Chronic Diastolic CHF:  euvolemic on exam. No dyspnea. Continue PO lasix and BB.    Signed, Lyda Jester, PA-C  07/17/2016, 2:02 PM    I have seen and examined the patient along with Lyda Jester,, PA.  I have reviewed the chart, notes and new data.  I agree with PA/NP's note.  Key new complaints: no presyncope/syncope. At least one of the falls was preceded by visual changes and dizziness followed by loss of consciousness. Other events sound like mechanical falls. Key examination changes: irregular, slow rhythm, normal prosthetic valve clicks, no regurgitant murmurs Key new findings / data: telemetry shows periods of severe bradycardia (45-49) and pauses up to 2.4". No pauses over 3" are seen  PLAN: Stop digoxin. May need to reduce atenolol dose as well. If pauses > 2" persist, might have to consider a single chamber pacemaker (consider a leadless device). Continue IV bivalirudin to warfarin transition.  Sanda Klein, MD, [place] [phone] 07/17/2016, 3:58 PM

## 2016-07-17 NOTE — Progress Notes (Signed)
Occupational Therapy Treatment Patient Details Name: Amanda Richardson MRN: [id number] DOB: 09/22/1923 Today's Date: 07/17/2016    History of present illness Pt is a 83 yo female admitted through ED following a fall on 07/13/16 resulting in a left hip hematoma with no fracture noted. Pt had a fall in 8/17 requiring a hemiarthroplasty but has no reported falls since. PMH significant for HTN, A-fib, Hypothyroidism, and chronic LE edema.   OT comments  Pt supine and fatigue from prolonged time in chair followed by toilet transfer with RN staff. Pt educated on adl task at bed level this session. Pt reports "I will be able to do more for you tomorrow. " pt very pleasant and eager to please staff. Pt comfortable and attempting a nap at the end of session   Follow Up Recommendations  Home health OT;Supervision/Assistance - 24 hour    Equipment Recommendations  None recommended by OT    Recommendations for Other Services      Precautions / Restrictions Precautions Precautions: Fall       Mobility Bed Mobility               General bed mobility comments: pt with RN staff (A) x2 this session observed. pt states "thank you to staff  Transfers                      Balance                                           ADL either performed or assessed with clinical judgement   ADL Overall ADL's : Needs assistance/impaired                                       General ADL Comments: Pt completed transfer with RN staff and doing bed mobility on arrival . pt educated on leg lifter with belt or sheet for increase independence. pt reports feeling better being able to help move that leg in the bed. pt educated on shower transfer with demo and handout. pt educated on dressing L LE first and use of reacher. pt with multiple reachers however often does not ahve them present when needed. educated on adding reacher to [employer]      Praxis      Cognition Arousal/Alertness: Awake/alert Behavior During Therapy: WFL for tasks assessed/performed Overall Cognitive Status: Within Functional Limits for tasks assessed                                          Exercises     Shoulder Instructions       General Comments pt educated on L LE in neutral when supine and not allowing to internally rotate    Pertinent Vitals/ Pain       Pain Assessment: No/denies pain  Home Living                                          Prior Functioning/Environment  Frequency  Min 2X/week        Progress Toward Goals  OT Goals(current goals can now be found in the care plan section)  Progress towards OT goals: Progressing toward goals  Acute Rehab OT Goals Patient Stated Goal: to get outside to pull her weeds OT Goal Formulation: With patient Time For Goal Achievement: 07/30/16 Potential to Achieve Goals: Good ADL Goals Pt Will Perform Lower Body Bathing: with supervision;sit to/from stand Pt Will Perform Lower Body Dressing: with supervision;sit to/from stand Pt Will Transfer to Toilet: with supervision;ambulating;bedside commode Pt Will Perform Toileting - Clothing Manipulation and hygiene: with supervision;sit to/from stand Pt Will Perform Tub/Shower Transfer: Shower transfer;with supervision;ambulating;shower seat;rolling walker  Plan Discharge plan remains appropriate    Co-evaluation                 End of Session    OT Visit Diagnosis: Unsteadiness on feet (R26.81);History of falling (Z91.81)   Activity Tolerance Patient tolerated treatment well   Patient Left in bed;with call bell/phone within reach;with bed alarm set   Nurse Communication Mobility status;Precautions        Time: 1320 (1320)-1340 OT Time Calculation (min): 20 min  Charges: OT General Charges $OT Visit: 1 Procedure OT Treatments $Self Care/Home Management : 8-22  mins   Jeri Modena   OTR/L Pager: [phone] Office: [phone] .    Parke Poisson B 07/17/2016, 2:47 PM

## 2016-07-17 NOTE — Progress Notes (Signed)
qPhysical Therapy Treatment Patient Details Name: Amanda Richardson MRN: [id number] DOB: 30-[name]-1924 Today's Date: 07/17/2016    History of Present Illness Pt is a 83 yo female admitted through ED following a fall on 07/13/16 resulting in a left hip hematoma with no fracture noted. Pt had a fall in 8/17 requiring a hemiarthroplasty but has no reported falls since. PMH significant for HTN, A-fib, Hypothyroidism, and chronic LE edema.    PT Comments    Patient is able to ambulate farther this session however does need assistance to manage RW and negotiate objects in hallway. Continue to progress as tolerated.   Follow Up Recommendations  Home health PT     Equipment Recommendations  Rolling walker with 5" wheels;Other (comment) (youth sized RW)    Recommendations for Other Services       Precautions / Restrictions Precautions Precautions: Fall Precaution Comments: Fall resulted in current hospitalization Restrictions Weight Bearing Restrictions: No    Mobility  Bed Mobility               General bed mobility comments: pt OOB in chair upon arrival  Transfers Overall transfer level: Needs assistance Equipment used: Rolling walker (2 wheeled) Transfers: Sit to/from Stand Sit to Stand: Min assist         General transfer comment: assist to power up into standing from recliner and commode with use of grab bar; cues for hand placement  Ambulation/Gait Ambulation/Gait assistance: Min assist;Mod assist Ambulation Distance (Feet): 130 Feet Assistive device: Rolling walker (2 wheeled) Gait Pattern/deviations: Step-through pattern;Trunk flexed;Narrow base of support;Decreased stride length Gait velocity: decreased   General Gait Details: vc for forward gaze and posture; assist and vc for management of RW as pt tends to run in to objects in hallway including walls and doorways   Stairs            Wheelchair Mobility    Modified Rankin (Stroke Patients Only)        Balance Overall balance assessment: Needs assistance Sitting-balance support: No upper extremity supported;Feet supported Sitting balance-Leahy Scale: Good     Standing balance support: Bilateral upper extremity supported;No upper extremity supported;During functional activity Standing balance-Leahy Scale: Fair                              Cognition Arousal/Alertness: Awake/alert Behavior During Therapy: WFL for tasks assessed/performed Overall Cognitive Status: Within Functional Limits for tasks assessed                                        Exercises      General Comments General comments (skin integrity, edema, etc.): discussed importance of using RW with all OOB mobility and fall prevention with pt and daughters      Pertinent Vitals/Pain Pain Assessment: No/denies pain    Home Living Family/patient expects to be discharged to:: Private residence Living Arrangements: Alone                  Prior Function            PT Goals (current goals can now be found in the care plan section) Acute Rehab PT Goals PT Goal Formulation: With patient Time For Goal Achievement: 07/22/16 Potential to Achieve Goals: Good Progress towards PT goals: Progressing toward goals    Frequency    Min 5X/week  PT Plan Current plan remains appropriate    Co-evaluation             End of Session Equipment Utilized During Treatment: Gait belt Activity Tolerance: Patient tolerated treatment well Patient left: in chair;with call bell/phone within reach;with family/visitor present Nurse Communication: Mobility status PT Visit Diagnosis: Difficulty in walking, not elsewhere classified (R26.2)     Time: 0488-8916 PT Time Calculation (min) (ACUTE ONLY): 31 min  Charges:  $Gait Training: 8-22 mins $Therapeutic Activity: 8-22 mins                    G Codes:       [name], PTA Pager: [phone]     [name] 07/17/2016, 10:05 AM

## 2016-07-17 NOTE — Progress Notes (Signed)
ANTICOAGULATION CONSULT NOTE - FOLLOW UP  Pharmacy Consult:  Bivalirudin and Coumadin Indication:  History of mechanical MVR and Afib  Allergies  Allergen Reactions  . Heparin Anaphylaxis  . Codeine Other (See Comments)    Unknown     Patient Measurements: Height = 5'2" Weight = 54.7 kg  Vital Signs: Temp: 98.5 F (36.9 C) (03/25 2107) Temp Source: Oral (03/25 2107) BP: 138/56 (03/25 2107) Pulse Rate: 65 (03/25 2107)  Labs:  Recent Labs  07/15/16 0555 07/16/16 0347 07/17/16 0308  HGB 10.2* 10.3* 10.3*  HCT 31.0* 30.2* 30.9*  PLT 122* 129* 131*  APTT 75* 64* 59*  LABPROT 26.3* 22.0* 20.8*  INR 2.37 1.90 1.76  CREATININE  --  0.98  --     Estimated Creatinine Clearance: 29.6 mL/min (by C-G formula based on SCr of 0.98 mg/dL).  Assessment: 31 YOF with history of mechanical AVR in 2001 and Afib on Coumadin PTA.  She presented s/p fall resulting in a large left hip hematoma.  Coumadin was placed on hold and patient was transitioned to Angiomax given heparin allergy.  APTT remains therapeutic although it trended down. INR 1.76 (falsely elevated by Angiomax). Plan to restart coumadin today. CBC is stable.  Goal of Therapy:  INR 2.5-3.5 PTT 50-85 sec Monitor platelets by anticoagulation protocol: Yes    Plan:  Continue bivalirudin at 0.1 mg/kg/hr ( 6 mg/hr) Daily CBC and aPTT and INR Monitor closely for hematoma expansion/reduction Coumadin 5 mg today  Sherlon Handing, PharmD, BCPS Clinical pharmacist, pager [phone] 07/17/2016, 6:19 AM

## 2016-07-17 NOTE — Progress Notes (Signed)
[facility]                                                                              Patient Demographics  Amanda Richardson, is a 83 y.o. female, DOB - 01-26-1924, ZOX:[id number]  Admit date - 07/13/2016   Admitting Physician Orson Eva, MD  Outpatient Primary MD for the patient is [facility], MD  Outpatient specialists:   LOS - 3  days    Chief Complaint  Patient presents with  . Fall  . Hip Pain       Brief summary   83 y.o.femalewith medical history significant for atrial fibrillation on coumadin, bullous pemphigoid, hypertension, hypothyroidism who presented to [facility] status post mechanical fall the day of the admission.The patient had unwitnessed fall while she was walking and she fell on her left side. There was no loss of consciousness. The patient did not have any significant pain, but was too weak to get up. She laid on the floor for approximately 2-3 hours prior to her daughter coming back to check up on her. EMS was activated secondary to the patient's weakness and inability to get up.  I assumed care on 07/16/16  Assessment & Plan    Principal problem Mechanical fall complicated with left thigh/hip hematoma: -  No acute fractures seen on hip x ray. Patient had a fall in August 2017 requiring left hip hemiarthroplasty. Now fall this admission however no fracture that had large hematoma. - Coumadin was placed on hold, patient has a heparin allergy, hence was placed on IV bivalirudin. Unfortunately disposition is challenging in this patient given large hematoma and mechanical mitral valve necessitating anticoagulation. - Per hematology, Lovenox or Arixtra or not options for her. Discussed in detail with Dr. Percival Spanish in detail yesterday, patient will need to be back on anti-coagulation given mechanical mitral valve, since she has been stable on therapeutic IV bivalirudin , stable H&H and stable hematoma which has been resolving, confirmed by the  daughter at the bedside today, will restart Coumadin today. Discussed in detail with the patient and daughter at the bedside, family agrees with the plan.  - Patient will continue IV bivalirudin bridge with Coumadin until INR is in therapeutic range 2.5-3.5   Active problems History of mechanical mitral valve replacement 2001 - Hematoma stable and resolving, start Coumadin today per pharmacy - Started patient on IV bivalirudin (heparin allergy)  Chronic atrial fibrillation - CHADS vasc score at 63 (age, gender, hypertension, CHF) - restart Coumadin, on IV angiomax - Continue digoxin 0.625 mg daily, atenolol for rate control  Chronic diastolic CHF - Compensated - 2 D ECHO in 2016 with normal EF  Hypertension,essential  - Continue atenolol and lisinopril  Hypothyroidism - Continue synthroid   Chronic lower extremity edema -Venous duplex left lower extremity due to worsening left lower extremity edema negative for DVT -Continue compression stockings  Bullous pemphigoid - Continue Temovate, niacinamide   Thrombocytopenia -Improving, chronic, monitor in the setting of anticoagulation    Code Status: Full CODE STATUS DVT Prophylaxis:  On IV  bivalirudin. Family Communication: Discussed in detail with the patient, all imaging results, lab results explained to the  patient and daughter at the bedside  Disposition Plan:   Time Spent in minutes   25  minutes  Procedures:    Consultants:   Hematology Cardiology  Antimicrobials:      Medications  Scheduled Meds: . atenolol  50 mg Oral Daily  . calcium-vitamin D  1 tablet Oral QPM  . cholecalciferol  2,000 Units Oral QPM  . digoxin  0.0625 mg Oral Daily  . furosemide  40 mg Oral Daily  . latanoprost  1 drop Both Eyes QHS  . levothyroxine  50 mcg Oral QAC breakfast  . lisinopril  10 mg Oral Daily  . multivitamin with minerals  1 tablet Oral QPM  . potassium chloride SA  20 mEq Oral Daily  . warfarin  5 mg  Oral ONCE-1800  . Warfarin - Pharmacist Dosing Inpatient   Does not apply q1800   Continuous Infusions: . bivalirudin (ANGIOMAX) infusion 0.5 mg/mL (Non-ACS indications) 0.1 mg/kg/hr (07/16/16 0106)   PRN Meds:.acetaminophen **OR** acetaminophen, clobetasol ointment, ondansetron **OR** ondansetron (ZOFRAN) IV   Antibiotics   Anti-infectives    None        Subjective:   Ama Mcmaster was seen and examined today.  Patient denies dizziness, chest pain, shortness of breath, abdominal pain, N/V/D/C, new weakness, numbess, tingling. No acute events overnight.  Hematoma  improving on the left thigh  Objective:   Vitals:   07/16/16 1548 07/16/16 2107 07/17/16 0659 07/17/16 1009  BP: (!) 126/53 (!) 138/56 (!) 141/59 132/62  Pulse: 71 65 65   Resp: 16 16 16    Temp: 98.9 F (37.2 C) 98.5 F (36.9 C) 98.3 F (36.8 C)   TempSrc:  Oral Oral   SpO2: 99% 99% 96%   Weight:      Height:        Intake/Output Summary (Last 24 hours) at 07/17/16 1137 Last data filed at 07/17/16 1000  Gross per 24 hour  Intake              780 ml  Output                0 ml  Net              780 ml     Wt Readings from Last 3 Encounters:  07/15/16 54.7 kg (120 lb 11.2 oz)  01/11/16 60.3 kg (133 lb)  01/03/16 60.3 kg (133 lb)     Exam  General: Alert and oriented x 3, NAD  HEENT:    Neck: Supple, no JVD  Cardiovascular: S1 S2 auscultated RRR  Respiratory: Clear to auscultation bilaterally, no wheezing, rales or rhonchi  Gastrointestinal: Soft, nontender, nondistended, + bowel sounds  Ext: no cyanosis clubbing or edema  Neuro: no new deficits  Skin: No rashes, hematoma on the left thigh resolving  Psych: Normal affect and demeanor, alert and oriented x3    Data Reviewed:  I have personally reviewed following labs and imaging studies  Micro Results No results found for this or any previous visit (from the past 240 hour(s)).  Radiology Reports Ct Head Wo Contrast  Result  Date: 07/13/2016 CLINICAL DATA:  Dizziness prior to a fall today at home. EXAM: CT HEAD WITHOUT CONTRAST TECHNIQUE: Contiguous axial images were obtained from the base of the skull through the vertex without intravenous contrast. COMPARISON:  08/08/2014. 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 Mildly progressive diffuse enlargement of the ventricles and  subarachnoid spaces. Patchy white matter low density in both cerebral hemispheres. Old right caudate head lacunar infarct. No intracranial hemorrhage, mass lesion or CT evidence of acute infarction. Vascular: No hyperdense vessel or unexpected calcification. Skull: Normal. Negative for fracture or focal lesion. Sinuses/Orbits: No acute finding. Other: None. IMPRESSION: 1. No acute abnormality. 2. 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 3. Stable old right caudate head lacunar infarct. 4. Mildly progressive atrophy and chronic small vessel white matter ischemic changes in both cerebral hemispheres. Electronically Signed   By: Claudie Revering M.D.   On: 07/13/2016 17:09   Dg Hip Unilat With Pelvis 2-3 Views Left  Result Date: 07/13/2016 CLINICAL DATA:  Left hip pain following a fall today. EXAM: DG HIP (WITH OR WITHOUT PELVIS) 2-3V LEFT COMPARISON:  12/17/2015. FINDINGS: Interval left femoral head prosthesis. No fracture or dislocation seen. Diffuse osteopenia. IMPRESSION: No fracture or dislocation. Electronically Signed   By: Claudie Revering M.D.   On: 07/13/2016 17:05    Lab Data:  CBC:  Recent Labs Lab 07/13/16 1758 07/13/16 2150 07/14/16 9024 07/15/16 0555 07/16/16 0347 07/17/16 0308  WBC 5.5 6.2 5.0 5.4 5.8 5.9  NEUTROABS 3.8  --   --   --   --   --   HGB 10.9* 10.5* 10.5* 10.2* 10.3* 10.3*  HCT 32.9* 31.8* 31.1* 31.0* 30.2* 30.9*  MCV 94.5 94.4 94.5 93.9 95.3 94.8  PLT 130* 125* 119* 122* 129* 097*   Basic Metabolic Panel:  Recent  Labs Lab 07/13/16 1758 07/14/16 0233 07/16/16 0347  NA 139 137 138  K 3.7 3.5 3.5  CL 107 105 106  CO2 25 24 26   GLUCOSE 107* 93 90  BUN 19 16 22*  CREATININE 0.92 0.85 0.98  CALCIUM 9.1 8.8* 8.6*   GFR: Estimated Creatinine Clearance: 29.6 mL/min (by C-G formula based on SCr of 0.98 mg/dL). Liver Function Tests: No results for input(s): AST, ALT, ALKPHOS, BILITOT, PROT, ALBUMIN in the last 168 hours. No results for input(s): LIPASE, AMYLASE in the last 168 hours. No results for input(s): AMMONIA in the last 168 hours. Coagulation Profile:  Recent Labs Lab 07/13/16 2150 07/14/16 0233 07/15/16 0555 07/16/16 0347 07/17/16 0308  INR 1.83 1.86 2.37 1.90 1.76   Cardiac Enzymes:  Recent Labs Lab 07/13/16 1758  CKTOTAL 77   BNP (last 3 results) No results for input(s): PROBNP in the last 8760 hours. HbA1C: No results for input(s): HGBA1C in the last 72 hours. CBG: No results for input(s): GLUCAP in the last 168 hours. Lipid Profile: No results for input(s): CHOL, HDL, LDLCALC, TRIG, CHOLHDL, LDLDIRECT in the last 72 hours. Thyroid Function Tests: No results for input(s): TSH, T4TOTAL, FREET4, T3FREE, THYROIDAB in the last 72 hours. Anemia Panel: No results for input(s): VITAMINB12, FOLATE, FERRITIN, TIBC, IRON, RETICCTPCT in the last 72 hours. Urine analysis:    Component Value Date/Time   COLORURINE YELLOW 07/13/2016 2013   APPEARANCEUR CLEAR 07/13/2016 2013   LABSPEC 1.011 07/13/2016 2013   PHURINE 7.0 07/13/2016 2013   GLUCOSEU NEGATIVE 07/13/2016 2013   HGBUR MODERATE (A) 07/13/2016 2013   BILIRUBINUR NEGATIVE 07/13/2016 2013   KETONESUR NEGATIVE 07/13/2016 2013   PROTEINUR NEGATIVE 07/13/2016 2013   UROBILINOGEN 4.0 (H) 08/04/2014 1608   NITRITE NEGATIVE 07/13/2016 2013   LEUKOCYTESUR NEGATIVE 07/13/2016 2013     RAI,RIPUDEEP M.D. [facility] 07/17/2016, 11:37 AM  Pager: [phone] Between 7am to 7pm - call Pager - [phone]  After 7pm go  to [email or web address] - password [email or web address]  Call night coverage person covering after 7pm

## 2016-07-18 DIAGNOSIS — R262 Difficulty in walking, not elsewhere classified: Secondary | ICD-10-CM

## 2016-07-18 DIAGNOSIS — I872 Venous insufficiency (chronic) (peripheral): Secondary | ICD-10-CM

## 2016-07-18 LAB — CBC
HEMATOCRIT: 30.3 % — AB (ref 36.0–46.0)
HEMOGLOBIN: 10.2 g/dL — AB (ref 12.0–15.0)
MCH: 32.4 pg (ref 26.0–34.0)
MCHC: 33.7 g/dL (ref 30.0–36.0)
MCV: 96.2 fL (ref 78.0–100.0)
PLATELETS: 141 10*3/uL — AB (ref 150–400)
RBC: 3.15 MIL/uL — ABNORMAL LOW (ref 3.87–5.11)
RDW: 14.9 % (ref 11.5–15.5)
WBC: 5.3 10*3/uL (ref 4.0–10.5)

## 2016-07-18 LAB — PROTIME-INR
INR: 1.57
Prothrombin Time: 19 seconds — ABNORMAL HIGH (ref 11.4–15.2)

## 2016-07-18 LAB — APTT: aPTT: 59 seconds — ABNORMAL HIGH (ref 24–36)

## 2016-07-18 MED ORDER — SENNOSIDES-DOCUSATE SODIUM 8.6-50 MG PO TABS
1.0000 | ORAL_TABLET | Freq: Two times a day (BID) | ORAL | Status: DC
  Administered 2016-07-18 – 2016-07-26 (×9): 1 via ORAL
  Filled 2016-07-18 (×9): qty 1

## 2016-07-18 MED ORDER — POLYETHYLENE GLYCOL 3350 17 G PO PACK
17.0000 g | PACK | Freq: Every day | ORAL | Status: DC | PRN
Start: 2016-07-18 — End: 2016-07-26

## 2016-07-18 MED ORDER — WARFARIN SODIUM 5 MG PO TABS
5.0000 mg | ORAL_TABLET | Freq: Once | ORAL | Status: AC
  Administered 2016-07-18: 5 mg via ORAL
  Filled 2016-07-18: qty 1

## 2016-07-18 NOTE — Progress Notes (Signed)
[facility]                                                                              Patient Demographics  Amanda Richardson, is a 83 y.o. female, DOB - Dec 23, 1923, PQZ:[id number]  Admit date - 07/13/2016   Admitting Physician Orson Eva, MD  Outpatient Primary MD for the patient is [facility], MD  Outpatient specialists:   LOS - 4  days    Chief Complaint  Patient presents with  . Fall  . Hip Pain       Brief summary   83 y.o.femalewith medical history significant for atrial fibrillation on coumadin, bullous pemphigoid, hypertension, hypothyroidism who presented to [facility] status post mechanical fall the day of the admission.The patient had unwitnessed fall while she was walking and she fell on her left side. There was no loss of consciousness. The patient did not have any significant pain, but was too weak to get up. She laid on the floor for approximately 2-3 hours prior to her daughter coming back to check up on her. EMS was activated secondary to the patient's weakness and inability to get up.  I assumed care on 07/16/16  Assessment & Plan    Principal problem Mechanical fall complicated with left thigh/hip hematoma: -  No acute fractures seen on hip x ray. Patient had a fall in August 2017 requiring left hip hemiarthroplasty. Now fall this admission however no fracture that had large hematoma. - Coumadin was placed on hold, patient has a heparin allergy, hence was placed on IV bivalirudin. Unfortunately disposition is challenging in this patient given large hematoma and mechanical mitral valve necessitating anticoagulation. - Per hematology,Dr Ennever, Lovenox or Arixtra are not options for her.  - Per cardiology, patient will need to be back on anti-coagulation given mechanical mitral valve, since she has been stable on therapeutic IV bivalirudin , stable H&H and stable hematoma which has been resolving, confirmed by the daughter on 3/26, hence  Coumadin restarted with family agreement with the plan.  - Patient will continue IV bivalirudin bridge with Coumadin until INR is in therapeutic range 2.5-3.5  - INR still subtherapeutic  Active problems History of mechanical mitral valve replacement 2001 - Hematoma stable and resolving, H&H stable - Started patient on IV bivalirudin (heparin allergy), Coumadin restarted  Chronic atrial fibrillation - CHADS vasc score at 42 (age, gender, hypertension, CHF) - restarted Coumadin, on IV angiomax - Continue digoxin 0.625 mg daily, atenolol for rate control  Chronic diastolic CHF - Compensated - 2 D ECHO in 2016 with normal EF  Hypertension,essential  - Continue atenolol and lisinopril  Hypothyroidism - Continue synthroid   Chronic lower extremity edema -Venous duplex left lower extremity due to worsening left lower extremity edema negative for DVT -Continue compression stockings  Bullous pemphigoid - Continue Temovate, niacinamide   Thrombocytopenia -Improving, chronic, monitor in the setting of anticoagulation    Code Status: Full CODE STATUS DVT Prophylaxis:  On IV  bivalirudin. Family Communication: Discussed in detail with the patient, all imaging results, lab results explained to the patient and daughter at the bedside  Disposition Plan: when INR in the therapeutic  range  Time Spent in minutes   25  minutes  Procedures:    Consultants:   Hematology Cardiology  Antimicrobials:      Medications  Scheduled Meds: . atenolol  50 mg Oral Daily  . calcium-vitamin D  1 tablet Oral QPM  . cholecalciferol  2,000 Units Oral QPM  . furosemide  40 mg Oral Daily  . latanoprost  1 drop Both Eyes QHS  . levothyroxine  50 mcg Oral QAC breakfast  . lisinopril  10 mg Oral Daily  . multivitamin with minerals  1 tablet Oral QPM  . potassium chloride SA  20 mEq Oral Daily  . senna-docusate  1 tablet Oral BID  . warfarin  5 mg Oral ONCE-1800  . Warfarin -  Pharmacist Dosing Inpatient   Does not apply q1800   Continuous Infusions: . bivalirudin (ANGIOMAX) infusion 0.5 mg/mL (Non-ACS indications) 0.1 mg/kg/hr (07/17/16 1601)   PRN Meds:.acetaminophen **OR** acetaminophen, clobetasol ointment, ondansetron **OR** ondansetron (ZOFRAN) IV, polyethylene glycol   Antibiotics   Anti-infectives    None        [name] was seen and examined today.  Hematoma stable, Low-grade temp 100.31F. Patient denies dizziness, chest pain, shortness of breath, abdominal pain, N/V/D/C, new weakness, numbess, tingling. No acute events overnight.  Hematoma  improving on the left thigh.  Objective:   Vitals:   07/17/16 1336 07/17/16 2018 07/18/16 0015 07/18/16 0515  BP: (!) 125/57 (!) 135/45  (!) 145/53  Pulse: (!) 53 64  62  Resp: 16 16    Temp: 98.1 F (36.7 C) (!) 100.6 F (38.1 C) 98 F (36.7 C) 97.9 F (36.6 C)  TempSrc: Oral Oral Oral Axillary  SpO2: 100% 98%  96%  Weight:      Height:        Intake/Output Summary (Last 24 hours) at 07/18/16 1207 Last data filed at 07/17/16 1800  Gross per 24 hour  Intake             1380 ml  Output                0 ml  Net             1380 ml     Wt Readings from Last 3 Encounters:  07/15/16 54.7 kg (120 lb 11.2 oz)  01/11/16 60.3 kg (133 lb)  01/03/16 60.3 kg (133 lb)     Exam  General: Alert and oriented x 3, NAD  HEENT:    Neck: Supple, no JVD  Cardiovascular: S1 S2 auscultated RRR  Respiratory: Clear to auscultation bilaterally, no wheezing, rales or rhonchi  Gastrointestinal: Soft, nontender, nondistended, + bowel sounds  Ext: no cyanosis clubbing or edema  Neuro: no new deficits  Skin: No rashes, hematoma on the left thigh resolving  Psych: Normal affect and demeanor, alert and oriented x3    Data Reviewed:  I have personally reviewed following labs and imaging studies  Micro Results No results found for this or any previous visit (from the past 240  hour(s)).  Radiology Reports Ct Head Wo Contrast  Result Date: 07/13/2016 CLINICAL DATA:  Dizziness prior to a fall today at home. EXAM: CT HEAD WITHOUT CONTRAST TECHNIQUE: Contiguous axial images were obtained from the base of the skull through the vertex without intravenous contrast. COMPARISON:  08/08/2014. 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 Mildly progressive diffuse enlargement of the ventricles  and subarachnoid spaces. Patchy white matter low density in both cerebral hemispheres. Old right caudate head lacunar infarct. No intracranial hemorrhage, mass lesion or CT evidence of acute infarction. Vascular: No hyperdense vessel or unexpected calcification. Skull: Normal. Negative for fracture or focal lesion. Sinuses/Orbits: No acute finding. Other: None. IMPRESSION: 1. No acute abnormality. 2. 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 3. Stable old right caudate head lacunar infarct. 4. Mildly progressive atrophy and chronic small vessel white matter ischemic changes in both cerebral hemispheres. Electronically Signed   By: Claudie Revering M.D.   On: 07/13/2016 17:09   Dg Hip Unilat With Pelvis 2-3 Views Left  Result Date: 07/13/2016 CLINICAL DATA:  Left hip pain following a fall today. EXAM: DG HIP (WITH OR WITHOUT PELVIS) 2-3V LEFT COMPARISON:  12/17/2015. FINDINGS: Interval left femoral head prosthesis. No fracture or dislocation seen. Diffuse osteopenia. IMPRESSION: No fracture or dislocation. Electronically Signed   By: Claudie Revering M.D.   On: 07/13/2016 17:05    Lab Data:  CBC:  Recent Labs Lab 07/13/16 1758  07/14/16 0611 07/15/16 0555 07/16/16 0347 07/17/16 0308 07/18/16 0423  WBC 5.5  < > 5.0 5.4 5.8 5.9 5.3  NEUTROABS 3.8  --   --   --   --   --   --   HGB 10.9*  < > 10.5* 10.2* 10.3* 10.3* 10.2*  HCT 32.9*  < > 31.1* 31.0* 30.2* 30.9* 30.3*  MCV 94.5  < > 94.5 93.9  95.3 94.8 96.2  PLT 130*  < > 119* 122* 129* 131* 141*  < > = values in this interval not displayed. Basic Metabolic Panel:  Recent Labs Lab 07/13/16 1758 07/14/16 0233 07/16/16 0347  NA 139 137 138  K 3.7 3.5 3.5  CL 107 105 106  CO2 25 24 26   GLUCOSE 107* 93 90  BUN 19 16 22*  CREATININE 0.92 0.85 0.98  CALCIUM 9.1 8.8* 8.6*   GFR: Estimated Creatinine Clearance: 29.6 mL/min (by C-G formula based on SCr of 0.98 mg/dL). Liver Function Tests: No results for input(s): AST, ALT, ALKPHOS, BILITOT, PROT, ALBUMIN in the last 168 hours. No results for input(s): LIPASE, AMYLASE in the last 168 hours. No results for input(s): AMMONIA in the last 168 hours. Coagulation Profile:  Recent Labs Lab 07/14/16 0233 07/15/16 0555 07/16/16 0347 07/17/16 0308 07/18/16 0423  INR 1.86 2.37 1.90 1.76 1.57   Cardiac Enzymes:  Recent Labs Lab 07/13/16 1758  CKTOTAL 77   BNP (last 3 results) No results for input(s): PROBNP in the last 8760 hours. HbA1C: No results for input(s): HGBA1C in the last 72 hours. CBG: No results for input(s): GLUCAP in the last 168 hours. Lipid Profile: No results for input(s): CHOL, HDL, LDLCALC, TRIG, CHOLHDL, LDLDIRECT in the last 72 hours. Thyroid Function Tests: No results for input(s): TSH, T4TOTAL, FREET4, T3FREE, THYROIDAB in the last 72 hours. Anemia Panel: No results for input(s): VITAMINB12, FOLATE, FERRITIN, TIBC, IRON, RETICCTPCT in the last 72 hours. Urine analysis:    Component Value Date/Time   COLORURINE YELLOW 07/13/2016 2013   APPEARANCEUR CLEAR 07/13/2016 2013   LABSPEC 1.011 07/13/2016 2013   PHURINE 7.0 07/13/2016 2013   GLUCOSEU NEGATIVE 07/13/2016 2013   HGBUR MODERATE (A) 07/13/2016 2013   BILIRUBINUR NEGATIVE 07/13/2016 2013   [place] NEGATIVE 07/13/2016 2013   PROTEINUR NEGATIVE 07/13/2016 2013   UROBILINOGEN 4.0 (H) 08/04/2014 1608   NITRITE NEGATIVE 07/13/2016 2013   LEUKOCYTESUR NEGATIVE 07/13/2016 2013  Amijah Timothy M.D. [facility] 07/18/2016, 12:07 PM  Pager: [phone] Between 7am to 7pm - call Pager - [phone]  After 7pm go to [email or web address] - password [email or web address]  Call night coverage person covering after 7pm

## 2016-07-18 NOTE — Progress Notes (Signed)
Progress Note  Patient Name: Amanda Richardson Date of Encounter: 07/18/2016  Primary Cardiologist: [name]  Subjective   No new CV complaints overnight, no signs of renewed bleeding. First dose of warfarin last night.  Still with periods of asymptomatic moderate bradycardia (minimum 37 bpm, mostly at night) and pauses up to 2.5". Digoxin just stopped, would not expect to see a change in ventricular response yet.   Inpatient Medications    Scheduled Meds: . atenolol  50 mg Oral Daily  . calcium-vitamin D  1 tablet Oral QPM  . cholecalciferol  2,000 Units Oral QPM  . furosemide  40 mg Oral Daily  . latanoprost  1 drop Both Eyes QHS  . levothyroxine  50 mcg Oral QAC breakfast  . lisinopril  10 mg Oral Daily  . multivitamin with minerals  1 tablet Oral QPM  . potassium chloride SA  20 mEq Oral Daily  . senna-docusate  1 tablet Oral BID  . warfarin  5 mg Oral ONCE-1800  . Warfarin - Pharmacist Dosing Inpatient   Does not apply q1800   Continuous Infusions: . bivalirudin (ANGIOMAX) infusion 0.5 mg/mL (Non-ACS indications) 0.1 mg/kg/hr (07/17/16 1601)   PRN Meds: acetaminophen **OR** acetaminophen, clobetasol ointment, ondansetron **OR** ondansetron (ZOFRAN) IV, polyethylene glycol   Vital Signs    Vitals:   07/17/16 1336 07/17/16 2018 07/18/16 0015 07/18/16 0515  BP: (!) 125/57 (!) 135/45  (!) 145/53  Pulse: (!) 53 64  62  Resp: 16 16    Temp: 98.1 F (36.7 C) (!) 100.6 F (38.1 C) 98 F (36.7 C) 97.9 F (36.6 C)  TempSrc: Oral Oral Oral Axillary  SpO2: 100% 98%  96%  Weight:      Height:        Intake/Output Summary (Last 24 hours) at 07/18/16 1201 Last data filed at 07/17/16 1800  Gross per 24 hour  Intake             1380 ml  Output                0 ml  Net             1380 ml   Filed Weights   07/15/16 1836  Weight: 54.7 kg (120 lb 11.2 oz)    Telemetry    AF with ventricular rates mostly in 60s, often in 40s at night. Occasional pauses 2-2.5", mostly  nocturnal. - Personally Reviewed    Physical Exam  Comfortable, very thin GEN: No acute distress.   Neck: No JVD Cardiac: irregular, quiet, but crisp prosthetic clicks no diastolic murmurs, rubs, or gallops.  Respiratory: Clear to auscultation bilaterally. GI: Soft, nontender, non-distended  MS: No edema; No deformity.  + left hip hematoma on lateral aspect w/ mild tenderness Neuro:  Nonfocal  Psych: Normal affect   Labs    Chemistry Recent Labs Lab 07/13/16 1758 07/14/16 0233 07/16/16 0347  NA 139 137 138  K 3.7 3.5 3.5  CL 107 105 106  CO2 25 24 26   GLUCOSE 107* 93 90  BUN 19 16 22*  CREATININE 0.92 0.85 0.98  CALCIUM 9.1 8.8* 8.6*  GFRNONAA 53* 58* 49*  GFRAA >60 >60 57*  ANIONGAP 7 8 6      Hematology Recent Labs Lab 07/16/16 0347 07/17/16 0308 07/18/16 0423  WBC 5.8 5.9 5.3  RBC 3.17* 3.26* 3.15*  HGB 10.3* 10.3* 10.2*  HCT 30.2* 30.9* 30.3*  MCV 95.3 94.8 96.2  MCH 32.5 31.6 32.4  MCHC  34.1 33.3 33.7  RDW 14.9 14.5 14.9  PLT 129* 131* 141*    Cardiac EnzymesNo results for input(s): TROPONINI in the last 168 hours.  Recent Labs Lab 07/13/16 2158  TROPIPOC 0.02    Patient Profile     83 y.o. female Amanda Richardson a 83 y.o.femalewith history of mechanical MVR 2001 by Dr. Cyndia Bent on Coumadin PTA, CAD s/p CABG at time of MVR (details unknown), chronic atrial fib, chronic venous lower extremity edema, chronic diastolic CHF, prior strokes seen on CT, prior left nephrectomy, prior ischemic bowel s/p surgery 2004, hypothyroidism, possible DVT 1990s, chronic-appearing anemia and thrombocytopenia, bullous pemphigoid, prior fall with left hip fx 11/2015 who presented to [facility] 07/13/16 with mechanical fall. Hip film neg for fx but she was found to have a left hip hematoma. Coumadin was held and she was started on bivalrudin due to h/o heparin allergy.  Assessment & Plan     1. Mechanical Fall Complicated by Left Hip Hematoma: .Restarted Coumadin with  bivalirudin bridge (heparin allergy).   2. Mechanical Aortic Valve: per above. Clinical exam suggests valve function is OK.  3. Chronic Atrial Fibrillation: rate appears to be excessively controlled on current regimen: continue atenolol and stopped the digoxin. May need to also reduce the atenolol if still bradycardic throughout the day today.CHA2DS2 VASc score Is 8.   4. CAD: s/p CABG at time of MVR (graft details unknown). She denies any recent anginal symptoms. No ASA, on Coumadin.   5. Chronic Diastolic CHF:  euvolemic on exam. No dyspnea. Continue PO lasix and BB  Signed, Sanda Klein, MD  07/18/2016, 12:01 PM

## 2016-07-18 NOTE — Progress Notes (Signed)
qPhysical Therapy Treatment Patient Details Name: Amanda Richardson MRN: [id number] DOB: 12-09-1923 Today's Date: 07/18/2016    History of Present Illness Pt is a 83 yo female admitted through ED following a fall on 07/13/16 resulting in a left hip hematoma with no fracture noted. Pt had a fall in 8/17 requiring a hemiarthroplasty but has no reported falls since. PMH significant for HTN, A-fib, Hypothyroidism, and chronic LE edema.    PT Comments    This session focused on w/c management/mobility, transfers, and bed mobility. Patient requires assistance to operate all w/c parts and unable to lock w/c brakes without assistance.  Continue to progress as tolerated.   Follow Up Recommendations  Home health PT     Equipment Recommendations  Rolling walker with 5" wheels;Other (comment) (youth sized RW)    Recommendations for Other Services       Precautions / Restrictions Precautions Precautions: Fall Precaution Comments: Fall resulted in current hospitalization Restrictions Weight Bearing Restrictions: No    Mobility  Bed Mobility Overal bed mobility: Needs Assistance Bed Mobility: Supine to Sit;Sit to Supine     Supine to sit: Min guard Sit to supine: Min assist   General bed mobility comments: assist to bring bilat LE into bed; increased time and effort  Transfers Overall transfer level: Needs assistance Equipment used: Rolling walker (2 wheeled) Transfers: Sit to/from Stand Sit to Stand: Min assist;Min guard         General transfer comment: min A first trial from EOB; min guard for safety from w/c and BSC with cues for safe hand placement  Ambulation/Gait Ambulation/Gait assistance: Min assist Ambulation Distance (Feet): 20 Feet Assistive device: Rolling walker (2 wheeled) Gait Pattern/deviations: Step-through pattern;Trunk flexed;Narrow base of support;Decreased stride length Gait velocity: decreased   General Gait Details: cues for forward gaze and proximity  of RW   Hotel manager mobility: Yes Wheelchair propulsion: Both upper extremities Wheelchair parts: Needs assistance Distance: 25 Wheelchair Assistance Details (indicate cue type and reason): pt required multimodal cues and assistance for operating all parts of w/c and is unable to lock/unlock brakes without assist; vc for propelling w/c and assistance required for turning  Modified Rankin (Stroke Patients Only)       Balance Overall balance assessment: Needs assistance Sitting-balance support: No upper extremity supported;Feet supported Sitting balance-Leahy Scale: Good     Standing balance support: Bilateral upper extremity supported;No upper extremity supported;During functional activity Standing balance-Leahy Scale: Fair                              Cognition Arousal/Alertness: Awake/alert Behavior During Therapy: WFL for tasks assessed/performed Overall Cognitive Status: Within Functional Limits for tasks assessed                                        Exercises      General Comments        Pertinent Vitals/Pain Pain Assessment: No/denies pain    Home Living                      Prior Function            PT Goals (current goals can now be found in the care plan section) Acute Rehab PT Goals PT Goal  Formulation: With patient Time For Goal Achievement: 07/22/16 Potential to Achieve Goals: Good Progress towards PT goals: Progressing toward goals    Frequency    Min 5X/week      PT Plan Current plan remains appropriate    Co-evaluation             End of Session Equipment Utilized During Treatment: Gait belt Activity Tolerance: Patient tolerated treatment well Patient left: with call bell/phone within reach;in bed Nurse Communication: Mobility status PT Visit Diagnosis: Difficulty in walking, not elsewhere classified (R26.2)     Time:  4235-3614 PT Time Calculation (min) (ACUTE ONLY): 47 min  Charges:  $Gait Training: 8-22 mins $Therapeutic Activity: 8-22 mins $Wheel Chair Management: 8-22 mins                    G Codes:       [name], PTA Pager: [phone]     [name] 07/18/2016, 4:38 PM

## 2016-07-18 NOTE — Progress Notes (Signed)
ANTICOAGULATION CONSULT NOTE - FOLLOW UP  Pharmacy Consult:  Bivalirudin Indication:  History of mechanical MVR and Afib  Allergies  Allergen Reactions  . Heparin Anaphylaxis  . Codeine Other (See Comments)    Unknown     Patient Measurements: Height = 5'2" Weight = 54.7 kg  Vital Signs: Temp: 97.9 F (36.6 C) (03/27 0515) Temp Source: Axillary (03/27 0515) BP: 145/53 (03/27 0515) Pulse Rate: 62 (03/27 0515)  Labs:  Recent Labs  07/16/16 0347 07/17/16 0308 07/18/16 0423  HGB 10.3* 10.3* 10.2*  HCT 30.2* 30.9* 30.3*  PLT 129* 131* 141*  APTT 64* 59* 59*  LABPROT 22.0* 20.8* 19.0*  INR 1.90 1.76 1.57  CREATININE 0.98  --   --     Estimated Creatinine Clearance: 29.6 mL/min (by C-G formula based on SCr of 0.98 mg/dL).    Assessment: 34 YOF with history of mechanical AVR in 2001 and Afib on Coumadin PTA.  She presented s/p fall resulting in a large left hip hematoma.  Patient was transitioned to Angiomax given heparin allergy.  Coumadin resumed 07/18/16.  APTT therapeutic and INR is trending down.  Patient's hemoglobin is stable and her platelet count is improving.  No worsening of hematoma is reported.   Goal of Therapy:  INR 2.5-3.5 PTT 50-85 sec Monitor platelets by anticoagulation protocol: Yes    Plan:  Continue bivalirudin at 0.1 mg/kg/hr (= 6 mg/hr) Repeat Coumadin 5mg  PO today Daily PT/INR, aPTT, CBC Monitor closely for hematoma expansion/reduction   Dartagnan Beavers D. Mina Marble, PharmD, BCPS Pager:  [phone] 07/18/2016, 7:28 AM

## 2016-07-18 NOTE — Discharge Instructions (Signed)

## 2016-07-18 NOTE — Care Management Important Message (Signed)
Important Message  Patient Details  Name: Amanda Richardson MRN: [id number] Date of Birth: 23-Jun-1923   Medicare Important Message Given:  Yes    Trulee Hamstra Montine Circle 07/18/2016, 11:50 AM

## 2016-07-19 DIAGNOSIS — W19XXXA Unspecified fall, initial encounter: Secondary | ICD-10-CM

## 2016-07-19 LAB — CBC
HCT: 31.7 % — ABNORMAL LOW (ref 36.0–46.0)
HEMOGLOBIN: 10.5 g/dL — AB (ref 12.0–15.0)
MCH: 31.6 pg (ref 26.0–34.0)
MCHC: 33.1 g/dL (ref 30.0–36.0)
MCV: 95.5 fL (ref 78.0–100.0)
Platelets: 155 10*3/uL (ref 150–400)
RBC: 3.32 MIL/uL — AB (ref 3.87–5.11)
RDW: 14.5 % (ref 11.5–15.5)
WBC: 6.3 10*3/uL (ref 4.0–10.5)

## 2016-07-19 LAB — PROTIME-INR
INR: 1.61
Prothrombin Time: 19.3 seconds — ABNORMAL HIGH (ref 11.4–15.2)

## 2016-07-19 LAB — APTT: aPTT: 59 seconds — ABNORMAL HIGH (ref 24–36)

## 2016-07-19 MED ORDER — WARFARIN SODIUM 5 MG PO TABS
5.0000 mg | ORAL_TABLET | Freq: Once | ORAL | Status: AC
  Administered 2016-07-19: 5 mg via ORAL
  Filled 2016-07-19: qty 1

## 2016-07-19 NOTE — Progress Notes (Signed)
Occupational Therapy Treatment Patient Details Name: Amanda Richardson MRN: [id number] DOB: [birth year] Today's Date: 07/19/2016    History of present illness Pt is a 83 yo female admitted through ED following a fall on 07/13/16 resulting in a left hip hematoma with no fracture noted. Pt had a fall in 8/17 requiring a hemiarthroplasty but has no reported falls since. PMH significant for HTN, A-fib, Hypothyroidism, and chronic LE edema.   OT comments  Pt demonstrates safety concerns and very high fall risk due to cognitive deficits. Pt with poor return demo of w/c use. Daughter present and pt agreeable to SNF.    Follow Up Recommendations  SNF    Equipment Recommendations  Other (comment) (defer)    Recommendations for Other Services      Precautions / Restrictions Precautions Precautions: Fall Precaution Comments: Fall resulted in current hospitalization Restrictions Weight Bearing Restrictions: No       Mobility Bed Mobility               General bed mobility comments: in bathroom on arrival with NT  Transfers Overall transfer level: Needs assistance Equipment used: Rolling walker (2 wheeled) Transfers: Sit to/from Stand Sit to Stand: Min assist         General transfer comment: pt is unable to complete w/c transfer without max (A). pt can not sequence the use of the w/c. pt with RW will abandon it per pts personal report "my house isnt that big"    Balance Overall balance assessment: Needs assistance Sitting-balance support: No upper extremity supported;Feet supported Sitting balance-Leahy Scale: Good     Standing balance support: Bilateral upper extremity supported Standing balance-Leahy Scale: Fair Standing balance comment: strong reliance on RW and unsteady  / fatigues quickly                           ADL either performed or assessed with clinical judgement   ADL Overall ADL's : Needs assistance/impaired Eating/Feeding: Set up;Sitting    Grooming: Wash/dry hands;Minimal assistance;Standing Grooming Details (indicate cue type and reason): due to balance deficits Upper Body Bathing: Moderate assistance   Lower Body Bathing: Total assistance       Lower Body Dressing: Total assistance Lower Body Dressing Details (indicate cue type and reason): don doff socks and ted hose. noticeable swelling bil LE and L more than R Toilet Transfer: Minimal assistance           Functional mobility during ADLs: Minimal assistance;Rolling walker (Max (A) with w/c) General ADL Comments: Pt unable to recall information about W/c or sequence. pt pushing the side of w/c without clear purpose stating "is this it? this? this?"      Vision       Perception     Praxis      Cognition Arousal/Alertness: Awake/alert Behavior During Therapy: WFL for tasks assessed/performed Overall Cognitive Status: History of cognitive impairments - at baseline Area of Impairment: Memory;Safety/judgement;Problem solving                     Memory: Decreased short-term memory   Safety/Judgement: Decreased awareness of safety;Decreased awareness of deficits   Problem Solving: Difficulty sequencing;Requires verbal cues;Requires tactile cues General Comments: Pt unable to recall information from previous sessions, pt unable to recall sequence even after repetition. pt reports information about daughter that daughter reports is not true. Daughter reports cognitive change . pt unaware of safety risk and fall risk  Exercises     Shoulder Instructions       General Comments      Pertinent Vitals/ Pain       Pain Assessment: No/denies pain  Home Living                                          Prior Functioning/Environment              Frequency  Min 2X/week        Progress Toward Goals  OT Goals(current goals can now be found in the care plan section)  Progress towards OT goals: Progressing toward  goals  Acute Rehab OT Goals Patient Stated Goal: to get outside to pull her weeds OT Goal Formulation: With patient Time For Goal Achievement: 07/30/16 Potential to Achieve Goals: Good ADL Goals Pt Will Perform Lower Body Bathing: with supervision;sit to/from stand Pt Will Perform Lower Body Dressing: with supervision;sit to/from stand Pt Will Transfer to Toilet: with supervision;ambulating;bedside commode Pt Will Perform Toileting - Clothing Manipulation and hygiene: with supervision;sit to/from stand Pt Will Perform Tub/Shower Transfer: Shower transfer;with supervision;ambulating;shower seat;rolling walker  Plan Discharge plan needs to be updated    Co-evaluation    PT/OT/SLP Co-Evaluation/Treatment: Yes Reason for Co-Treatment: To address functional/ADL transfers PT goals addressed during session: Mobility/safety with mobility;Proper use of DME OT goals addressed during session: ADL's and self-care;Strengthening/ROM      End of Session Equipment Utilized During Treatment: Gait belt;Rolling walker  OT Visit Diagnosis: Unsteadiness on feet (R26.81);History of falling (Z91.81)   Activity Tolerance Patient tolerated treatment well   Patient Left in chair;with call bell/phone within reach;with family/visitor present   Nurse Communication Mobility status;Precautions        Time: 1610 (9604)-5409 OT Time Calculation (min): 37 min  Charges: OT General Charges $OT Visit: 1 Procedure OT Treatments $Self Care/Home Management : 8-22 mins   Jeri Modena   OTR/L Pager: [phone] Office: [phone] .    Parke Poisson B 07/19/2016, 3:44 PM

## 2016-07-19 NOTE — Progress Notes (Signed)
Progress Note  Patient Name: Amanda Richardson Date of Encounter: 07/19/2016   Primary Cardiologist: Dr. [name]  Subjective   Patient is feeling well; denies chest pain, SOB, and palpitations.   Inpatient Medications    Scheduled Meds: . atenolol  50 mg Oral Daily  . calcium-vitamin D  1 tablet Oral QPM  . cholecalciferol  2,000 Units Oral QPM  . furosemide  40 mg Oral Daily  . latanoprost  1 drop Both Eyes QHS  . levothyroxine  50 mcg Oral QAC breakfast  . lisinopril  10 mg Oral Daily  . multivitamin with minerals  1 tablet Oral QPM  . potassium chloride SA  20 mEq Oral Daily  . senna-docusate  1 tablet Oral BID  . warfarin  5 mg Oral ONCE-1800  . Warfarin - Pharmacist Dosing Inpatient   Does not apply q1800   Continuous Infusions: . bivalirudin (ANGIOMAX) infusion 0.5 mg/mL (Non-ACS indications) 0.1 mg/kg/hr (07/19/16 1055)   PRN Meds: acetaminophen **OR** acetaminophen, clobetasol ointment, ondansetron **OR** ondansetron (ZOFRAN) IV, polyethylene glycol   Vital Signs    Vitals:   07/18/16 0515 07/18/16 1300 07/18/16 1936 07/19/16 0453  BP: (!) 145/53 (!) 115/52 135/62 (!) 145/64  Pulse: 62 (!) 104 70 72  Resp:  16 15 16   Temp: 97.9 F (36.6 C) 98.3 F (36.8 C) 97.8 F (36.6 C) 98.2 F (36.8 C)  TempSrc: Axillary Oral Oral Oral  SpO2: 96% 99% 99% 96%  Weight:      Height:        Intake/Output Summary (Last 24 hours) at 07/19/16 1102 Last data filed at 07/19/16 0500  Gross per 24 hour  Intake              384 ml  Output                0 ml  Net              384 ml   Filed Weights   07/15/16 1836  Weight: 120 lb 11.2 oz (54.7 kg)     Physical Exam   General: Well developed, well nourished, female appearing in no acute distress. Head: Normocephalic, atraumatic.  Neck: Supple without bruits, JVD Lungs:  Resp regular and unlabored, CTA. Heart: Irregular rythm and regular rate, S1, S2, no S3, S4, or murmur; no rub. Abdomen: Soft, non-tender,  non-distended with normoactive bowel sounds. No hepatomegaly. No rebound/guarding. No obvious abdominal masses. Extremities: No clubbing, cyanosis, + edema with skin changes. Distal pedal pulses are faintbilaterally. Neuro: Alert and oriented X 3. Moves all extremities spontaneously. Psych: Normal affect.  Labs    Chemistry Recent Labs Lab 07/13/16 1758 07/14/16 0233 07/16/16 0347  NA 139 137 138  K 3.7 3.5 3.5  CL 107 105 106  CO2 25 24 26   GLUCOSE 107* 93 90  BUN 19 16 22*  CREATININE 0.92 0.85 0.98  CALCIUM 9.1 8.8* 8.6*  GFRNONAA 53* 58* 49*  GFRAA >60 >60 57*  ANIONGAP 7 8 6      Hematology Recent Labs Lab 07/17/16 0308 07/18/16 0423 07/19/16 0225  WBC 5.9 5.3 6.3  RBC 3.26* 3.15* 3.32*  HGB 10.3* 10.2* 10.5*  HCT 30.9* 30.3* 31.7*  MCV 94.8 96.2 95.5  MCH 31.6 32.4 31.6  MCHC 33.3 33.7 33.1  RDW 14.5 14.9 14.5  PLT 131* 141* 155    Cardiac EnzymesNo results for input(s): TROPONINI in the last 168 hours.  Recent Labs Lab 07/13/16 2158  TROPIPOC 0.02  BNPNo results for input(s): BNP, PROBNP in the last 168 hours.   DDimer No results for input(s): DDIMER in the last 168 hours.   Radiology    No results found.   Telemetry    Afib in the 50-70s; pauses overnight, longest pause was 2.1 sec - Personally Reviewed  ECG    No new tracings - Personally Reviewed   Cardiac Studies   Echo 10/10/13: Study Conclusions - Left ventricle: The cavity size was mildly dilated. Wall thickness was normal. Systolic function was normal. The estimated ejection fraction was in the range of 55% to 60%. - Aortic valve: There was moderate regurgitation. - Mitral valve: There was mild regurgitation. Valve area by pressure half-time: 2.44 cm^2. - Left atrium: The atrium was massively dilated. The appendage was severely dilated. - Right ventricle: The cavity size was moderately dilated. - Right atrium: The atrium was moderately to severely dilated.  The appendage was severely dilated.  Patient Profile     82 y.o. female with history of mechanical MVR 2001 by Dr. Cyndia Bent on Coumadin PTA, CAD s/p CABG at time of MVR (details unknown), chronic atrial fib, chronic venous lower extremity edema, chronic diastolic CHF, prior strokes seen on CT, prior left nephrectomy, prior ischemic bowel s/p surgery 2004, hypothyroidism, possible DVT 1990s, chronic-appearing anemia and thrombocytopenia, bullous pemphigoid, prior fall with left hip fx 11/2015 who presented to [facility] 07/13/16 with mechanical fall. Hip film neg for fx but she was found to have a left hip hematoma. Coumadin was held and she was started on bivalrudin due to h/o heparin allergy.  Assessment & Plan    1. Mechanical Fall Complicated by Left Hip Hematoma: .Restarted Coumadin with bivalirudin bridge (heparin allergy). INR today 1.61 (1.57); Hb stable at 10.5 (10.2).  2. Mechanical Aortic Valve: per above. Clinical exam suggests valve function is OK.  3. Chronic Atrial Fibrillation: rate appears to be excessively controlled on current regimen: continue atenolol and stopped the digoxin. May need to also reduce the atenolol if still bradycardic throughout the day today.CHA2DS2 VASc score Is 8.  - telemetry with Afib in the 50-70s - pt is tolerating this rate well  4. CAD: s/p CABG at time of MVR (graft details unknown). She denies any recent anginal symptoms. No ASA, on Coumadin.   5. Chronic Diastolic CHF: euvolemic on exam. No dyspnea. Continue PO lasix and BB   Signed, Ledora Bottcher , [place] 11:02 AM 07/19/2016 Pager: [phone]   I have seen and examined the patient along with Ledora Bottcher, PA.  I have reviewed the chart, notes and new data.  I agree with PA's note.  Key new complaints: slowly increasing strength Key examination changes: crisp prosthetic clicks, irregular Key new findings / data: HR 43-45 while taking a nap an hour ago. While walking with PT her  maximum HR was 71 bpm.  PLAN: Will reduce the atenolol dose as well. INR has bottomed out at 1.6, expect it will start rising tomorrow. Target INR 2.5.  Sanda Klein, MD, [place] [phone] 07/19/2016, 12:12 PM

## 2016-07-19 NOTE — Progress Notes (Signed)
ANTICOAGULATION CONSULT NOTE - FOLLOW UP  Pharmacy Consult:  Bivalirudin Indication:  History of mechanical MVR and Afib  Allergies  Allergen Reactions  . Heparin Anaphylaxis  . Codeine Other (See Comments)    Unknown     Patient Measurements: Height = 5'2" Weight = 54.7 kg  Vital Signs: Temp: 98.2 F (36.8 C) (03/28 0453) Temp Source: Oral (03/28 0453) BP: 145/64 (03/28 0453) Pulse Rate: 72 (03/28 0453)  Labs:  Recent Labs  07/17/16 0308 07/18/16 0423 07/19/16 0225  HGB 10.3* 10.2* 10.5*  HCT 30.9* 30.3* 31.7*  PLT 131* 141* 155  APTT 59* 59* 59*  LABPROT 20.8* 19.0* 19.3*  INR 1.76 1.57 1.61    Estimated Creatinine Clearance: 29.6 mL/min (by C-G formula based on SCr of 0.98 mg/dL).    Assessment: 34 YOF with history of mechanical AVR in 2001 and Afib on Coumadin PTA.  She presented s/p fall resulting in a large left hip hematoma.  Patient was transitioned to Angiomax given heparin allergy.  Coumadin resumed 07/18/16.  APTT therapeutic and INR is starting to trend back up.  Patient's hemoglobin is stable and her platelet has normalized.  No worsening of hematoma per patient.  Home Coumadin dose:  3mg  daily except for 1.5mg  on Monday   Goal of Therapy:  INR 2.5-3.5 PTT 50-85 sec Monitor platelets by anticoagulation protocol: Yes    Plan:  Continue bivalirudin at 0.1 mg/kg/hr (= 12 mg/hr) Repeat Coumadin 5mg  PO today Daily PT/INR, aPTT, CBC Monitor closely for hematoma expansion/reduction   Laquentin Loudermilk D. Mina Marble, PharmD, BCPS Pager:  [phone] 07/19/2016, 8:09 AM

## 2016-07-19 NOTE — Progress Notes (Signed)
qPhysical Therapy Treatment Patient Details Name: Amanda Richardson MRN: [id number] DOB: [birth year] Today's Date: 07/19/2016    History of Present Illness Pt is a 83 yo female admitted through ED following a fall on 07/13/16 resulting in a left hip hematoma with no fracture noted. Pt had a fall in 8/17 requiring a hemiarthroplasty but has no reported falls since. PMH significant for HTN, A-fib, Hypothyroidism, and chronic LE edema.    PT Comments    Patient continues to demonstrate generalized weakness, decreased activity tolerance, and balance/cognitive deficits. Given pt's current mobility level and history of falls recommending SNF for further skilled PT services to maximize independence and safety with mobility. Patient and daughter agreeable to SNF.   Follow Up Recommendations  SNF;Supervision/Assistance - 24 hour     Equipment Recommendations  Rolling walker with 5" wheels;Other (comment) (youth sized RW)    Recommendations for Other Services       Precautions / Restrictions Precautions Precautions: Fall Precaution Comments: Fall resulted in current hospitalization Restrictions Weight Bearing Restrictions: No    Mobility  Bed Mobility               General bed mobility comments: pt with NT in bathroom upon arrival  Transfers Overall transfer level: Needs assistance Equipment used: Rolling walker (2 wheeled) Transfers: Sit to/from Stand Sit to Stand: Min assist         General transfer comment: assist to steady and stabilize RW upon standing; cues for safe hand placement and assistance required to manage bilat leg rests prior to standing from w/c  Ambulation/Gait Ambulation/Gait assistance: Min assist Ambulation Distance (Feet): 12 Feet Assistive device: Rolling walker (2 wheeled) Gait Pattern/deviations: Step-through pattern;Trunk flexed;Narrow base of support;Decreased stride length Gait velocity: decreased   General Gait Details: cues for forward gaze  and proximity of RW; assist to steady and manage RW   Hotel manager mobility: Yes Wheelchair propulsion: Both upper extremities Wheelchair parts: Needs assistance Distance: 12 Wheelchair Assistance Details (indicate cue type and reason): pt without recall of how to manage w/c parts from previous session; assistance required to turn w/c and operate parts with multimodal cues  Modified Rankin (Stroke Patients Only)       Balance Overall balance assessment: Needs assistance Sitting-balance support: No upper extremity supported;Feet supported Sitting balance-Leahy Scale: Good     Standing balance support: Bilateral upper extremity supported;No upper extremity supported;During functional activity Standing balance-Leahy Scale: Fair                              Cognition Arousal/Alertness: Awake/alert Behavior During Therapy: WFL for tasks assessed/performed Overall Cognitive Status: History of cognitive impairments - at baseline (daughter present and reported decreased short term memory ) Area of Impairment: Memory;Safety/judgement;Problem solving                     Memory: Decreased short-term memory   Safety/Judgement: Decreased awareness of safety;Decreased awareness of deficits   Problem Solving: Difficulty sequencing;Requires verbal cues;Requires tactile cues        Exercises      General Comments        Pertinent Vitals/Pain Pain Assessment: No/denies pain    Home Living                      Prior Function  PT Goals (current goals can now be found in the care plan section) Acute Rehab PT Goals PT Goal Formulation: With patient Time For Goal Achievement: 07/22/16 Potential to Achieve Goals: Good Progress towards PT goals: Progressing toward goals    Frequency    Min 5X/week      PT Plan Discharge plan needs to be updated    Co-evaluation  PT/OT/SLP Co-Evaluation/Treatment: Yes Reason for Co-Treatment: To address functional/ADL transfers;Necessary to address cognition/behavior during functional activity PT goals addressed during session: Mobility/safety with mobility;Proper use of DME       End of Session Equipment Utilized During Treatment: Gait belt Activity Tolerance: Patient tolerated treatment well Patient left: with call bell/phone within reach;in bed Nurse Communication: Mobility status PT Visit Diagnosis: Difficulty in walking, not elsewhere classified (R26.2)     Time: 9311-2162 PT Time Calculation (min) (ACUTE ONLY): 36 min  Charges:  $Therapeutic Activity: 8-22 mins                    G Codes:       [name], PTA Pager: [phone]     [name] 07/19/2016, 3:22 PM

## 2016-07-19 NOTE — Progress Notes (Signed)
[facility]                                                                             Patient Demographics  Amanda Richardson, is a 83 y.o. female, DOB - Nov 11, 1923, HOZ:[id number]  Admit date - 07/13/2016   Admitting Physician Orson Eva, MD  Outpatient Primary MD for the patient is Merrilee Seashore, MD  LOS - 5  days    Chief Complaint  Patient presents with  . Fall  . Hip Pain       Brief summary   83 y.o.femalewith medical history significant for atrial fibrillation on coumadin, bullous pemphigoid, hypertension, hypothyroidism who presented to [facility] status post mechanical fall the day of the admission.The patient had unwitnessed fall while she was walking and she fell on her left side. There was no loss of consciousness. The patient did not have any significant pain, but was too weak to get up. She laid on the floor for approximately 2-3 hours prior to her daughter coming back to check up on her. EMS was activated secondary to the patient's weakness and inability to get up.   Assessment & Plan   Principal problem Mechanical fall complicated with left thigh/hip hematoma: -  No acute fractures seen on hip x ray. Patient had a fall in August 2017 requiring left hip hemiarthroplasty. Now fall this admission however no fracture that had large hematoma. - Coumadin was placed on hold, patient has a heparin allergy, hence was placed on IV bivalirudin. Unfortunately disposition is challenging in this patient given large hematoma and mechanical mitral valve necessitating anticoagulation. - Per hematology,Dr Ennever, Lovenox or Arixtra are not options for her.  - Per cardiology, patient will need to be back on anti-coagulation given mechanical mitral valve, since she has been stable on therapeutic IV bivalirudin - Patient will continue IV bivalirudin bridge with Coumadin until INR is in therapeutic range 2.5-3.5  - INR still subtherapeutic  Active problems History of  mechanical mitral valve replacement 2001 - Hematoma stable and resolving, H&H stable - Started patient on IV bivalirudin (heparin allergy), Coumadin restarted  Chronic atrial fibrillation - CHADS vasc score at 65 (age, gender, hypertension, CHF) - restarted Coumadin, INR 1.6 this AM - rate controlled on Atenolol   Chronic diastolic CHF - Compensated - 2 D ECHO in 2016 with normal EF  Hypertension,essential  - Continue atenolol and lisinopril  Hypothyroidism - Continue synthroid   Chronic lower extremity edema - Venous duplex left lower extremity due to worsening left lower extremity edema negative for DVT  Bullous pemphigoid - Continue Temovate, niacinamide   Thrombocytopenia - monitor in the setting of anticoagulation  - resolved at this point   Code Status: Full CODE STATUS DVT Prophylaxis:  On IV  bivalirudin. Family Communication: Discussed in detail with the patient and her daughter, all imaging results, lab results   Disposition Plan: when INR in the therapeutic range  Time Spent in minutes   25  minutes  Procedures:   None  Consultants:    Hematology  Cardiology  Antimicrobials:   None  Medications: Scheduled Meds: . atenolol  50 mg Oral Daily  . calcium-vitamin D  1 tablet Oral QPM  . cholecalciferol  2,000 Units Oral QPM  . furosemide  40 mg Oral Daily  . latanoprost  1 drop Both Eyes QHS  . levothyroxine  50 mcg Oral QAC breakfast  . lisinopril  10 mg Oral Daily  . multivitamin with minerals  1 tablet Oral QPM  . potassium chloride SA  20 mEq Oral Daily  . senna-docusate  1 tablet Oral BID  . warfarin  5 mg Oral ONCE-1800  . Warfarin - Pharmacist Dosing Inpatient   Does not apply q1800   Continuous Infusions: . bivalirudin (ANGIOMAX) infusion 0.5 mg/mL (Non-ACS indications) 0.1 mg/kg/hr (07/19/16 1055)   PRN Meds:.acetaminophen **OR** acetaminophen, clobetasol ointment, ondansetron **OR** ondansetron (ZOFRAN) IV, polyethylene  glycol  Subjective:   Hematoma stable, Low-grade temp 100.68F. Patient denies dizziness, chest pain, shortness of breath, abdominal pain.  Objective:   Vitals:   07/18/16 0515 07/18/16 1300 07/18/16 1936 07/19/16 0453  BP: (!) 145/53 (!) 115/52 135/62 (!) 145/64  Pulse: 62 (!) 104 70 72  Resp:  16 15 16   Temp: 97.9 F (36.6 C) 98.3 F (36.8 C) 97.8 F (36.6 C) 98.2 F (36.8 C)  TempSrc: Axillary Oral Oral Oral  SpO2: 96% 99% 99% 96%  Weight:      Height:        Intake/Output Summary (Last 24 hours) at 07/19/16 1140 Last data filed at 07/19/16 0500  Gross per 24 hour  Intake              384 ml  Output                0 ml  Net              384 ml   Wt Readings from Last 3 Encounters:  07/15/16 54.7 kg (120 lb 11.2 oz)  01/11/16 60.3 kg (133 lb)  01/03/16 60.3 kg (133 lb)   Exam  General: Alert and oriented x 3, NAD  HEENT:    Neck: Supple, no JVD  Cardiovascular:  IRRR  Respiratory: Clear to auscultation bilaterally, no wheezing, rales or rhonchi  Gastrointestinal: Soft, nontender, nondistended, + bowel sounds  Ext: no cyanosis clubbing or edema  Neuro: no new deficits  Data Reviewed:  I have personally reviewed following labs and imaging studies  Micro Results No results found for this or any previous visit (from the past 240 hour(s)).  Radiology Reports Ct Head Wo Contrast  Result Date: 07/13/2016 CLINICAL DATA:  Dizziness prior to a fall today at home. EXAM: CT HEAD WITHOUT CONTRAST TECHNIQUE: Contiguous axial images were obtained from the base of the skull through the vertex without intravenous contrast. COMPARISON:  08/08/2014. 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 Mildly progressive diffuse enlargement of the ventricles and subarachnoid spaces. Patchy white matter low density in both cerebral hemispheres. Old right caudate head lacunar infarct.  No intracranial hemorrhage, mass lesion or CT evidence of acute infarction. Vascular: No hyperdense vessel or unexpected calcification. Skull: Normal. Negative for fracture or focal lesion. Sinuses/Orbits: No acute finding. Other: None. IMPRESSION: 1. No acute abnormality. 2. 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 3. Stable old right caudate head lacunar infarct. 4. Mildly progressive atrophy and chronic small vessel white matter ischemic changes in both cerebral hemispheres. Electronically Signed   By: Claudie Revering M.D.   On: 07/13/2016 17:09   Dg Hip Unilat With Pelvis 2-3 Views Left  Result  Date: 07/13/2016 CLINICAL DATA:  Left hip pain following a fall today. EXAM: DG HIP (WITH OR WITHOUT PELVIS) 2-3V LEFT COMPARISON:  12/17/2015. FINDINGS: Interval left femoral head prosthesis. No fracture or dislocation seen. Diffuse osteopenia. IMPRESSION: No fracture or dislocation. Electronically Signed   By: Claudie Revering M.D.   On: 07/13/2016 17:05   Lab Data:  CBC:  Recent Labs Lab 07/13/16 1758  07/15/16 0555 07/16/16 0347 07/17/16 0308 07/18/16 0423 07/19/16 0225  WBC 5.5  < > 5.4 5.8 5.9 5.3 6.3  NEUTROABS 3.8  --   --   --   --   --   --   HGB 10.9*  < > 10.2* 10.3* 10.3* 10.2* 10.5*  HCT 32.9*  < > 31.0* 30.2* 30.9* 30.3* 31.7*  MCV 94.5  < > 93.9 95.3 94.8 96.2 95.5  PLT 130*  < > 122* 129* 131* 141* 155  < > = values in this interval not displayed.  Basic Metabolic Panel:  Recent Labs Lab 07/13/16 1758 07/14/16 0233 07/16/16 0347  NA 139 137 138  K 3.7 3.5 3.5  CL 107 105 106  CO2 25 24 26   GLUCOSE 107* 93 90  BUN 19 16 22*  CREATININE 0.92 0.85 0.98  CALCIUM 9.1 8.8* 8.6*   Coagulation Profile:  Recent Labs Lab 07/15/16 0555 07/16/16 0347 07/17/16 0308 07/18/16 0423 07/19/16 0225  INR 2.37 1.90 1.76 1.57 1.61   Cardiac Enzymes:  Recent Labs Lab 07/13/16 1758  CKTOTAL 77   Urine analysis:    Component Value Date/Time   COLORURINE YELLOW  07/13/2016 2013   APPEARANCEUR CLEAR 07/13/2016 2013   LABSPEC 1.011 07/13/2016 2013   PHURINE 7.0 07/13/2016 2013   GLUCOSEU NEGATIVE 07/13/2016 2013   HGBUR MODERATE (A) 07/13/2016 2013   BILIRUBINUR NEGATIVE 07/13/2016 2013   KETONESUR NEGATIVE 07/13/2016 2013   PROTEINUR NEGATIVE 07/13/2016 2013   UROBILINOGEN 4.0 (H) 08/04/2014 1608   NITRITE NEGATIVE 07/13/2016 2013   LEUKOCYTESUR NEGATIVE 07/13/2016 2013   Faye Ramsay M.D. [facility] 07/19/2016, 11:40 AM  Pager: [phone] Between 7am to 7pm - call Pager - [phone]  After 7pm go to [email or web address] - password [email or web address]  Call night coverage person covering after 7pm

## 2016-07-20 LAB — BASIC METABOLIC PANEL
Anion gap: 7 (ref 5–15)
BUN: 20 mg/dL (ref 6–20)
CO2: 28 mmol/L (ref 22–32)
CREATININE: 0.86 mg/dL (ref 0.44–1.00)
Calcium: 8.7 mg/dL — ABNORMAL LOW (ref 8.9–10.3)
Chloride: 105 mmol/L (ref 101–111)
GFR calc non Af Amer: 57 mL/min — ABNORMAL LOW (ref >60)
Glucose, Bld: 92 mg/dL (ref 65–99)
Potassium: 3.8 mmol/L (ref 3.5–5.1)
Sodium: 140 mmol/L (ref 135–145)

## 2016-07-20 LAB — APTT: APTT: 63 s — AB (ref 24–36)

## 2016-07-20 LAB — CBC
HEMATOCRIT: 34.2 % — AB (ref 36.0–46.0)
Hemoglobin: 11.2 g/dL — ABNORMAL LOW (ref 12.0–15.0)
MCH: 31.3 pg (ref 26.0–34.0)
MCHC: 32.7 g/dL (ref 30.0–36.0)
MCV: 95.5 fL (ref 78.0–100.0)
Platelets: 167 10*3/uL (ref 150–400)
RBC: 3.58 MIL/uL — ABNORMAL LOW (ref 3.87–5.11)
RDW: 14.4 % (ref 11.5–15.5)
WBC: 6.4 10*3/uL (ref 4.0–10.5)

## 2016-07-20 LAB — PROTIME-INR
INR: 1.77
Prothrombin Time: 20.8 seconds — ABNORMAL HIGH (ref 11.4–15.2)

## 2016-07-20 MED ORDER — WARFARIN SODIUM 5 MG PO TABS
5.0000 mg | ORAL_TABLET | Freq: Once | ORAL | Status: AC
  Administered 2016-07-20: 5 mg via ORAL
  Filled 2016-07-20: qty 1

## 2016-07-20 MED ORDER — ATENOLOL 50 MG PO TABS
25.0000 mg | ORAL_TABLET | Freq: Every day | ORAL | Status: DC
  Administered 2016-07-21 – 2016-07-26 (×6): 25 mg via ORAL
  Filled 2016-07-20 (×6): qty 1

## 2016-07-20 NOTE — Progress Notes (Signed)
ANTICOAGULATION CONSULT NOTE - FOLLOW UP  Pharmacy Consult:  Bivalirudin Indication:  History of mechanical MVR and Afib  Allergies  Allergen Reactions  . Heparin Anaphylaxis  . Codeine Other (See Comments)    Unknown     Assessment: 89 YOF on Coumadin 3mg  daily exc for 1.5mg  on Mon PTA for Afib (CHADsVASc = 8) + hx mechanical MVR 2001. Admit with large left hip hematoma > Angiomax (anaphylaxis to heparin), Coumadin resumed 3/26.  High bleeding risk with history of falls.  APTT stable at 63, INR up to 1.77. Hgb stable at 11.2, plts wnl. No s/s of bleed.  Goal of Therapy:  INR 2.5-3.5 PTT 50-85 sec Monitor platelets by anticoagulation protocol: Yes   Plan:  Continue bivalirudin at 0.1 mg/kg/hr (= 12 mg/hr) Repeat Coumadin 5mg  PO today Monitor daily INR / aPTT, CBC, s/s of bleed Monitor closely for hematoma expansion/reduction May need higher dose of Coumadin at discharge  Elenor Quinones, PharmD, [place] [facility] Pager [phone] 07/20/2016 10:25 AM

## 2016-07-20 NOTE — NC FL2 (Signed)
[place] LEVEL OF CARE SCREENING TOOL     IDENTIFICATION  Patient Name: Amanda Richardson Birthdate: 09/27/1923 Sex: female Admission Date (Current Location): 07/13/2016  [facility] and [place] Number:  Herbalist and Address:  The [place], [place] [address] [place], [place] [address]      Provider Number: [id number]  Attending Physician Name and Address:  Theodis Blaze, MD  Relative Name and Phone Number:       Current Level of Care: Hospital Recommended Level of Care: [place] Prior Approval Number:    Date Approved/Denied:   PASRR Number: [id number]  Discharge Plan: SNF    Current Diagnoses: Patient Active Problem List   Diagnosis Date Noted  . Fall   . Chronic diastolic CHF (congestive heart failure) ([place]) 07/16/2016  . Anemia 07/16/2016  . Thrombocytopenia ([place]) 07/16/2016  . Chronic venous insufficiency 07/16/2016  . Hematoma of left hip 07/13/2016  . H/O mechanical mitral valve replacement 2001   . Hypertension   . S/p nephrectomy   . H/O coronary artery bypass surgery   . Chronic atrial fibrillation ([place])   . Hypothyroidism   . Bullous pemphigoid   . Chronic anticoagulation     Orientation RESPIRATION BLADDER Height & Weight     Self, Time, Situation, Place  Normal Incontinent Weight: 120 lb 11.2 oz (54.7 kg) Height:  5\' 2"  (157.5 cm)  BEHAVIORAL SYMPTOMS/MOOD NEUROLOGICAL BOWEL NUTRITION STATUS      Continent Diet  AMBULATORY STATUS COMMUNICATION OF NEEDS Skin   Extensive Assist Verbally Normal                       Personal Care Assistance Level of Assistance  Bathing, Dressing, Feeding Bathing Assistance: Maximum assistance Feeding assistance: Limited assistance Dressing Assistance: Maximum assistance     Functional Limitations Info  Hearing   Hearing Info: Impaired      SPECIAL CARE FACTORS FREQUENCY  PT (By licensed PT), OT (By licensed OT)     PT Frequency:  5xweek OT Frequency: 5xweek            Contractures      Additional Factors Info  Code Status, Allergies Code Status Info: FULL Allergies Info: Heparin, Codeine           Current Medications (07/20/2016):  This is the current hospital active medication list Current Facility-Administered Medications  Medication Dose Route Frequency Provider Last Rate Last Dose  . acetaminophen (TYLENOL) tablet 650 mg  650 mg Oral Q6H PRN Orson Eva, MD   650 mg at 07/17/16 2118   Or  . acetaminophen (TYLENOL) suppository 650 mg  650 mg Rectal Q6H PRN Orson Eva, MD      . atenolol (TENORMIN) tablet 50 mg  50 mg Oral Daily Orson Eva, MD   50 mg at 07/20/16 0914  . bivalirudin (ANGIOMAX) 250 mg in sodium chloride 0.9 % 500 mL (0.5 mg/mL) infusion  0.1 mg/kg/hr (Order-Specific) Intravenous Continuous Franky Macho, RPH 12 mL/hr at 07/19/16 1055 0.1 mg/kg/hr at 07/19/16 1055  . calcium-vitamin D (OSCAL WITH D) 500-200 MG-UNIT per tablet 1 tablet  1 tablet Oral QPM Orson Eva, MD   1 tablet at 07/19/16 1816  . cholecalciferol (VITAMIN D) tablet 2,000 Units  2,000 Units Oral QPM Orson Eva, MD   2,000 Units at 07/19/16 1816  . clobetasol ointment (TEMOVATE) 5.62 % 1 application  1 application Topical BID PRN Orson Eva, MD      .  furosemide (LASIX) tablet 40 mg  40 mg Oral Daily Orson Eva, MD   40 mg at 07/20/16 0916  . latanoprost (XALATAN) 0.005 % ophthalmic solution 1 drop  1 drop Both Eyes QHS Orson Eva, MD   1 drop at 07/19/16 2148  . levothyroxine (SYNTHROID, LEVOTHROID) tablet 50 mcg  50 mcg Oral QAC breakfast Orson Eva, MD   50 mcg at 07/20/16 0630  . lisinopril (PRINIVIL,ZESTRIL) tablet 10 mg  10 mg Oral Daily Orson Eva, MD   10 mg at 07/20/16 0916  . multivitamin with minerals tablet 1 tablet  1 tablet Oral QPM Orson Eva, MD   1 tablet at 07/19/16 1816  . ondansetron (ZOFRAN) tablet 4 mg  4 mg Oral Q6H PRN Orson Eva, MD       Or  . ondansetron (ZOFRAN) injection 4 mg  4 mg Intravenous Q6H PRN Orson Eva, MD      . polyethylene glycol (MIRALAX / GLYCOLAX) packet 17 g  17 g Oral Daily PRN Ripudeep K Rai, MD      . potassium chloride SA (K-DUR,KLOR-CON) CR tablet 20 mEq  20 mEq Oral Daily Orson Eva, MD   20 mEq at 07/20/16 0913  . senna-docusate (Senokot-S) tablet 1 tablet  1 tablet Oral BID Ripudeep Krystal Eaton, MD   1 tablet at 07/20/16 0916  . warfarin (COUMADIN) tablet 5 mg  5 mg Oral ONCE-1800 Cecilio Asper Alexander, RPH      . Warfarin - Pharmacist Dosing Inpatient   Does not apply [place], [facility]         Discharge Medications: Please see discharge summary for a list of discharge medications.  Relevant Imaging Results:  Relevant Lab Results:   Additional Information SS#: [id number]  Jorge Ny, LCSW

## 2016-07-20 NOTE — Progress Notes (Signed)
Progress Note  Patient Name: Amanda Richardson Date of Encounter: 07/20/2016  Primary Cardiologist: [place] flat in bed, relaxed, no CV complaints.  Inpatient Medications    Scheduled Meds: . atenolol  50 mg Oral Daily  . calcium-vitamin D  1 tablet Oral QPM  . cholecalciferol  2,000 Units Oral QPM  . furosemide  40 mg Oral Daily  . latanoprost  1 drop Both Eyes QHS  . levothyroxine  50 mcg Oral QAC breakfast  . lisinopril  10 mg Oral Daily  . multivitamin with minerals  1 tablet Oral QPM  . potassium chloride SA  20 mEq Oral Daily  . senna-docusate  1 tablet Oral BID  . warfarin  5 mg Oral ONCE-1800  . Warfarin - Pharmacist Dosing Inpatient   Does not apply q1800   Continuous Infusions: . bivalirudin (ANGIOMAX) infusion 0.5 mg/mL (Non-ACS indications) 0.1 mg/kg/hr (07/19/16 1055)   PRN Meds: acetaminophen **OR** acetaminophen, clobetasol ointment, ondansetron **OR** ondansetron (ZOFRAN) IV, polyethylene glycol   Vital Signs    Vitals:   07/19/16 2002 07/20/16 0453 07/20/16 0912 07/20/16 1350  BP: (!) 144/52 (!) 145/68 126/75 (!) 140/57  Pulse:  71 63 (!) 55  Resp:  16  16  Temp:  98.2 F (36.8 C)  98.6 F (37 C)  TempSrc:  Oral    SpO2:  96% 99% 99%  Weight:      Height:       No intake or output data in the 24 hours ending 07/20/16 1554 Filed Weights   07/15/16 1836  Weight: 54.7 kg (120 lb 11.2 oz)    Telemetry    AFib with rates 45-70 - Personally Reviewed    Physical Exam  Extremely slender GEN: No acute distress.   Neck: No JVD Cardiac: irregular, normal prosthetic clicks, no murmurs, rubs, or gallops.  Respiratory: Clear to auscultation bilaterally. GI: Soft, nontender, non-distended  MS: No edema; No deformity. Neuro:  Nonfocal  Psych: Normal affect   Labs    Chemistry Recent Labs Lab 07/14/16 0233 07/16/16 0347 07/20/16 0754  NA 137 138 140  K 3.5 3.5 3.8  CL 105 106 105  CO2 24 26 28   GLUCOSE 93 90 92    BUN 16 22* 20  CREATININE 0.85 0.98 0.86  CALCIUM 8.8* 8.6* 8.7*  GFRNONAA 58* 49* 57*  GFRAA >60 57* >60  ANIONGAP 8 6 7      Hematology Recent Labs Lab 07/18/16 0423 07/19/16 0225 07/20/16 0754  WBC 5.3 6.3 6.4  RBC 3.15* 3.32* 3.58*  HGB 10.2* 10.5* 11.2*  HCT 30.3* 31.7* 34.2*  MCV 96.2 95.5 95.5  MCH 32.4 31.6 31.3  MCHC 33.7 33.1 32.7  RDW 14.9 14.5 14.4  PLT 141* 155 167    Cardiac EnzymesNo results for input(s): TROPONINI in the last 168 hours.  Recent Labs Lab 07/13/16 2158  TROPIPOC 0.02      Patient Profile     83 y.o. female with history of mechanical MVR 2001 by Dr. Cyndia Bent on Coumadin PTA, CAD s/p CABG at time of MVR (details unknown), chronic atrial fib, chronic venous lower extremity edema, chronic diastolic CHF, prior strokes seen on CT, prior left nephrectomy, prior ischemic bowel s/p surgery 2004, hypothyroidism, possible DVT 1990s, chronic-appearing anemia and thrombocytopenia, bullous pemphigoid, prior fall with left hip fx 11/2015 who presented to [facility] 07/13/16 with mechanical fall.  Restarting warfarin while on bivalirudin IV. Repeated pauses of 2-3 seconds and marked bradycardia on  monitor (cause of syncope and falls?) led to DC digoxin and reduction in atenolol dose.  Assessment & Plan    1. AFib: received the usual dose of atenolol 50 mg daily today - dose will be reduced tomorrow. Average resting heart rate in low 60s after we stopped digoxin, no faster than 70 with ambulation.  2.S/P MVR (mechanical):  warfarin restarted and INR starting to climb, suspect will be at/close to target in 24-48h.  Signed, Sanda Klein, MD  07/20/2016, 3:54 PM

## 2016-07-20 NOTE — Progress Notes (Signed)
[facility]                                                                             Patient Demographics  Amanda Richardson, is a 83 y.o. female, DOB - Jun 04, 1923, [id number]  Admit date - 07/13/2016   Admitting Physician Orson Eva, MD  Outpatient Primary MD for the patient is Merrilee Seashore, MD  LOS - 6  days    Chief Complaint  Patient presents with  . Fall  . Hip Pain       Brief summary   83 y.o.femalewith medical history significant for atrial fibrillation on coumadin, bullous pemphigoid, hypertension, hypothyroidism who presented to [facility] status post mechanical fall the day of the admission.The patient had unwitnessed fall while she was walking and she fell on her left side. There was no loss of consciousness. The patient did not have any significant pain, but was too weak to get up. She laid on the floor for approximately 2-3 hours prior to her daughter coming back to check up on her. EMS was activated secondary to the patient's weakness and inability to get up.   Assessment & Plan   Principal problem Mechanical fall complicated with left thigh/hip hematoma: -  No acute fractures seen on hip x ray. Patient had a fall in August 2017 requiring left hip hemiarthroplasty. Now fall this admission however no fracture that had large hematoma. - Coumadin was placed on hold, patient has a heparin allergy, hence was placed on IV bivalirudin. Unfortunately disposition is challenging in this patient given large hematoma and mechanical mitral valve necessitating anticoagulation. - Per hematology,Dr Ennever, Lovenox or Arixtra are not options for her.  - Per cardiology, patient will need to be back on anti-coagulation given mechanical mitral valve, since she has been stable on therapeutic IV bivalirudin - Patient will continue IV bivalirudin bridge with Coumadin until INR is in therapeutic range 2.5-3.5  - INR still subtherapeutic - pt has no concerns this AM    Active problems History of mechanical mitral valve replacement 2001 - Hematoma stable and resolving, H&H stable - Started patient on IV bivalirudin (heparin allergy), Coumadin restarted - INR subtherapeutic this AM but overall trending up   Chronic atrial fibrillation - CHADS vasc score at 68 (age, gender, hypertension, CHF) - restarted Coumadin, INR 1.6 this AM - rate controlled on Atenolol  - d/w cardio if we can d/c tele monitor  Chronic diastolic CHF - Compensated - 2 D ECHO in 2016 with normal EF  Hypertension,essential  - Continue atenolol and lisinopril - reasonable inpatient control   Hypothyroidism - Continue synthroid   Chronic lower extremity edema - Venous duplex left lower extremity due to worsening left lower extremity edema negative for DVT  Bullous pemphigoid - Continue Temovate, niacinamide   Thrombocytopenia - monitor in the setting of anticoagulation  - resolved at this point   Code Status: Full CODE STATUS DVT Prophylaxis:  On IV  bivalirudin. Family Communication: Discussed in detail with the patient and her daughter, all imaging results, lab results   Disposition Plan: when INR in the therapeutic range  Time Spent in minutes   25  minutes  Procedures:  None  Consultants:    Hematology  Cardiology  Antimicrobials:   None  Medications: Scheduled Meds: . atenolol  50 mg Oral Daily  . calcium-vitamin D  1 tablet Oral QPM  . cholecalciferol  2,000 Units Oral QPM  . furosemide  40 mg Oral Daily  . latanoprost  1 drop Both Eyes QHS  . levothyroxine  50 mcg Oral QAC breakfast  . lisinopril  10 mg Oral Daily  . multivitamin with minerals  1 tablet Oral QPM  . potassium chloride SA  20 mEq Oral Daily  . senna-docusate  1 tablet Oral BID  . warfarin  5 mg Oral ONCE-1800  . Warfarin - Pharmacist Dosing Inpatient   Does not apply q1800   Continuous Infusions: . bivalirudin (ANGIOMAX) infusion 0.5 mg/mL (Non-ACS indications)  0.1 mg/kg/hr (07/19/16 1055)   PRN Meds:.acetaminophen **OR** acetaminophen, clobetasol ointment, ondansetron **OR** ondansetron (ZOFRAN) IV, polyethylene glycol  Subjective:   Hematoma stable, Low-grade temp 100.42F. Patient denies dizziness, chest pain, shortness of breath, abdominal pain.  Objective:   Vitals:   07/19/16 1958 07/19/16 2002 07/20/16 0453 07/20/16 0912  BP: (!) 142/45 (!) 144/52 (!) 145/68 126/75  Pulse: 71  71 63  Resp: 16  16   Temp: 98.4 F (36.9 C)  98.2 F (36.8 C)   TempSrc: Oral  Oral   SpO2: 100%  96% 99%  Weight:      Height:       No intake or output data in the 24 hours ending 07/20/16 1315 Wt Readings from Last 3 Encounters:  07/15/16 54.7 kg (120 lb 11.2 oz)  01/11/16 60.3 kg (133 lb)  01/03/16 60.3 kg (133 lb)   Exam  General: Alert and oriented x 3, NAD  HEENT:    Neck: Supple, no JVD  Cardiovascular:  IRRR  Respiratory: Clear to auscultation bilaterally, no wheezing, rales or rhonchi  Gastrointestinal: Soft, nontender, nondistended, + bowel sounds  Ext: no cyanosis clubbing or edema  Neuro: no new deficits  Data Reviewed:  I have personally reviewed following labs and imaging studies  Micro Results No results found for this or any previous visit (from the past 240 hour(s)).  Radiology Reports Ct Head Wo Contrast  Result Date: 07/13/2016 CLINICAL DATA:  Dizziness prior to a fall today at home. EXAM: CT HEAD WITHOUT CONTRAST TECHNIQUE: Contiguous axial images were obtained from the base of the skull through the vertex without intravenous contrast. COMPARISON:  08/08/2014. 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 Mildly progressive diffuse enlargement of the ventricles and subarachnoid spaces. Patchy white matter low density in both cerebral hemispheres. Old right caudate head lacunar infarct. No intracranial hemorrhage, mass  lesion or CT evidence of acute infarction. Vascular: No hyperdense vessel or unexpected calcification. Skull: Normal. Negative for fracture or focal lesion. Sinuses/Orbits: No acute finding. Other: None. IMPRESSION: 1. No acute abnormality. 2. 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 3. Stable old right caudate head lacunar infarct. 4. Mildly progressive atrophy and chronic small vessel white matter ischemic changes in both cerebral hemispheres. Electronically Signed   By: Claudie Revering M.D.   On: 07/13/2016 17:09   Dg Hip Unilat With Pelvis 2-3 Views Left  Result Date: 07/13/2016 CLINICAL DATA:  Left hip pain following a fall today. EXAM: DG HIP (WITH OR WITHOUT PELVIS) 2-3V LEFT COMPARISON:  12/17/2015. FINDINGS: Interval left femoral head prosthesis. No fracture or dislocation seen. Diffuse osteopenia. IMPRESSION: No fracture  or dislocation. Electronically Signed   By: Claudie Revering M.D.   On: 07/13/2016 17:05   Lab Data:  CBC:  Recent Labs Lab 07/13/16 1758  07/16/16 0347 07/17/16 0308 07/18/16 0423 07/19/16 0225 07/20/16 0754  WBC 5.5  < > 5.8 5.9 5.3 6.3 6.4  NEUTROABS 3.8  --   --   --   --   --   --   HGB 10.9*  < > 10.3* 10.3* 10.2* 10.5* 11.2*  HCT 32.9*  < > 30.2* 30.9* 30.3* 31.7* 34.2*  MCV 94.5  < > 95.3 94.8 96.2 95.5 95.5  PLT 130*  < > 129* 131* 141* 155 167  < > = values in this interval not displayed.  Basic Metabolic Panel:  Recent Labs Lab 07/13/16 1758 07/14/16 0233 07/16/16 0347 07/20/16 0754  NA 139 137 138 140  K 3.7 3.5 3.5 3.8  CL 107 105 106 105  CO2 25 24 26 28   GLUCOSE 107* 93 90 92  BUN 19 16 22* 20  CREATININE 0.92 0.85 0.98 0.86  CALCIUM 9.1 8.8* 8.6* 8.7*   Coagulation Profile:  Recent Labs Lab 07/16/16 0347 07/17/16 0308 07/18/16 0423 07/19/16 0225 07/20/16 0754  INR 1.90 1.76 1.57 1.61 1.77   Cardiac Enzymes:  Recent Labs Lab 07/13/16 1758  CKTOTAL 77   Urine analysis:    Component Value Date/Time   COLORURINE  YELLOW 07/13/2016 2013   APPEARANCEUR CLEAR 07/13/2016 2013   LABSPEC 1.011 07/13/2016 2013   PHURINE 7.0 07/13/2016 2013   GLUCOSEU NEGATIVE 07/13/2016 2013   HGBUR MODERATE (A) 07/13/2016 2013   BILIRUBINUR NEGATIVE 07/13/2016 2013   [place] NEGATIVE 07/13/2016 2013   PROTEINUR NEGATIVE 07/13/2016 2013   UROBILINOGEN 4.0 (H) 08/04/2014 1608   NITRITE NEGATIVE 07/13/2016 2013   LEUKOCYTESUR NEGATIVE 07/13/2016 2013   Faye Ramsay M.D. [facility] 07/20/2016, 1:15 PM  Pager: [phone] Between 7am to 7pm - call Pager - [phone]  After 7pm go to [email or web address] - password [email or web address]  Call night coverage person covering after 7pm

## 2016-07-20 NOTE — Clinical Social Work Note (Signed)
Clinical Social Work Assessment  Patient Details  Name: Amanda Richardson MRN: [id number] Date of Birth: 07-Jun-1923  Date of referral:  07/20/16               Reason for consult:  Facility Placement                Permission sought to share information with:  Chartered certified accountant granted to share information::  Yes, Verbal Permission Granted  Name::     Adult nurse::  SNFs  Relationship::  dtr  Contact Information:     Housing/Transportation Living arrangements for the past 2 months:  [place] of Information:  Patient, Adult Children Patient Interpreter Needed:  None Criminal Activity/Legal Involvement Pertinent to Current Situation/Hospitalization:  No - Comment as needed Significant Relationships:  Adult Children Lives with:  Self Do you feel safe going back to the place where you live?  No Need for family participation in patient care:  Yes (Comment) (help with care coordination)  Care giving concerns: Pt lives at home alone- does not have enough support to return home given current impairment.  Pt has adult children able to check in on her but not enough physical support at this time.   Social Worker assessment / plan:  CSW met with pt and pt dtr, Amanda Richardson, to discuss PT recommendation for SNF.  They were already aware of recommendation and are familiar with rehab placement from pt previous rehab stay.  Employment status:  Retired Nurse, adult PT Recommendations:  [place] / Referral to community resources:  [place]  Patient/Family's Response to care:  Pt and dtr are agreeable to SNF stay when pt is medically cleared to leave the hospital.  Pt is not happy about needing SNF but is agreeable despite her displeasure with the situation.  Patient/Family's Understanding of and Emotional Response to Diagnosis, Current Treatment, and Prognosis:  Pt and family express good  understanding of pt condition and needs at this time.  Emotional Assessment Appearance:  Appears stated age Attitude/Demeanor/Rapport:    Affect (typically observed):  Appropriate Orientation:  Oriented to Self, Oriented to Place, Oriented to  Time, Oriented to Situation Alcohol / Substance use:  Not Applicable Psych involvement (Current and /or in the community):  No (Comment)  Discharge Needs  Concerns to be addressed:  Care Coordination Readmission within the last 30 days:  No Current discharge risk:  Physical Impairment, Lives alone Barriers to Discharge:  Continued Medical Work up   Amanda Ny, LCSW 07/20/2016, 11:55 AM

## 2016-07-21 DIAGNOSIS — I4821 Permanent atrial fibrillation: Secondary | ICD-10-CM

## 2016-07-21 LAB — PROTIME-INR
INR: 1.9
Prothrombin Time: 22.1 seconds — ABNORMAL HIGH (ref 11.4–15.2)

## 2016-07-21 LAB — BASIC METABOLIC PANEL
Anion gap: 7 (ref 5–15)
BUN: 20 mg/dL (ref 6–20)
CO2: 27 mmol/L (ref 22–32)
CREATININE: 0.91 mg/dL (ref 0.44–1.00)
Calcium: 8.4 mg/dL — ABNORMAL LOW (ref 8.9–10.3)
Chloride: 104 mmol/L (ref 101–111)
GFR calc Af Amer: >60 mL/min (ref >60)
GFR calc non Af Amer: 54 mL/min — ABNORMAL LOW (ref >60)
Glucose, Bld: 90 mg/dL (ref 65–99)
Potassium: 3.6 mmol/L (ref 3.5–5.1)
SODIUM: 138 mmol/L (ref 135–145)

## 2016-07-21 LAB — CBC
HEMATOCRIT: 32.3 % — AB (ref 36.0–46.0)
HEMOGLOBIN: 10.8 g/dL — AB (ref 12.0–15.0)
MCH: 31.8 pg (ref 26.0–34.0)
MCHC: 33.4 g/dL (ref 30.0–36.0)
MCV: 95 fL (ref 78.0–100.0)
Platelets: 181 10*3/uL (ref 150–400)
RBC: 3.4 MIL/uL — ABNORMAL LOW (ref 3.87–5.11)
RDW: 14.6 % (ref 11.5–15.5)
WBC: 5.8 10*3/uL (ref 4.0–10.5)

## 2016-07-21 LAB — APTT: aPTT: 63 seconds — ABNORMAL HIGH (ref 24–36)

## 2016-07-21 NOTE — Progress Notes (Signed)
[facility]                                                                             Patient Demographics  Amanda Richardson, is a 83 y.o. female, DOB - 01/25/1924, [id number]  Admit date - 07/13/2016   Admitting Physician Orson Eva, MD  Outpatient Primary MD for the patient is Merrilee Seashore, MD  LOS - 7  days    Chief Complaint  Patient presents with  . Fall  . Hip Pain       Brief summary   83 y.o.femalewith medical history significant for atrial fibrillation on coumadin, bullous pemphigoid, hypertension, hypothyroidism who presented to [facility] status post mechanical fall the day of the admission.The patient had unwitnessed fall while she was walking and she fell on her left side. There was no loss of consciousness. The patient did not have any significant pain, but was too weak to get up. She laid on the floor for approximately 2-3 hours prior to her daughter coming back to check up on her. EMS was activated secondary to the patient's weakness and inability to get up.   Assessment & Plan   Principal problem Mechanical fall complicated with left thigh/hip hematoma: -  No acute fractures seen on hip x ray. Patient had a fall in August 2017 requiring left hip hemiarthroplasty. Now fall this admission however no fracture that had large hematoma. - Coumadin was placed on hold, patient has a heparin allergy, hence was placed on IV bivalirudin. Unfortunately disposition is challenging in this patient given large hematoma and mechanical mitral valve necessitating anticoagulation. - Per hematology,Dr Ennever, Lovenox or Arixtra are not options for her.  - Per cardiology, patient will need to be back on anti-coagulation given mechanical mitral valve, since she has been stable on therapeutic IV bivalirudin - Patient will continue IV bivalirudin bridge with Coumadin until INR is in therapeutic range 2.5-3.5  - INR nicely trending up  Active problems History of  mechanical mitral valve replacement 2001 - Hematoma stable and resolving, H&H stable - Started patient on IV bivalirudin (heparin allergy), Coumadin restarted - INR subtherapeutic this AM but overall trending up  - possible d/c to SNF in 1-2 days when INR therapeutic   Chronic atrial fibrillation - CHADS vasc score at 3 (age, gender, hypertension, CHF) - restarted Coumadin, INR 1.6 this AM - rate controlled on Atenolol, HR 74 this AM   Chronic diastolic CHF - Compensated - 2 D ECHO in 2016 with normal EF  Hypertension,essential  - Continue atenolol and lisinopril - reasonable inpatient control   Hypothyroidism - Continue synthroid   Chronic lower extremity edema - Venous duplex left lower extremity due to worsening left lower extremity edema negative for DVT  Bullous pemphigoid - Continue Temovate, niacinamide   Thrombocytopenia - monitor in the setting of anticoagulation  - resolved at this point   Code Status: Full CODE STATUS DVT Prophylaxis:  On IV  bivalirudin. Family Communication: Discussed in detail with the patient and her daughter   Disposition Plan: when INR in the therapeutic range  Procedures:   None  Consultants:    Hematology  Cardiology  Antimicrobials:  None  Medications: Scheduled Meds: . atenolol  25 mg Oral Daily  . calcium-vitamin D  1 tablet Oral QPM  . cholecalciferol  2,000 Units Oral QPM  . furosemide  40 mg Oral Daily  . latanoprost  1 drop Both Eyes QHS  . levothyroxine  50 mcg Oral QAC breakfast  . lisinopril  10 mg Oral Daily  . multivitamin with minerals  1 tablet Oral QPM  . potassium chloride SA  20 mEq Oral Daily  . senna-docusate  1 tablet Oral BID  . Warfarin - Pharmacist Dosing Inpatient   Does not apply q1800   Continuous Infusions: . bivalirudin (ANGIOMAX) infusion 0.5 mg/mL (Non-ACS indications) 0.1 mg/kg/hr (07/21/16 0616)   PRN Meds:.acetaminophen **OR** acetaminophen, clobetasol ointment,  ondansetron **OR** ondansetron (ZOFRAN) IV, polyethylene glycol  Subjective:   Patient denies dizziness, chest pain, shortness of breath, abdominal pain.  Objective:   Vitals:   07/20/16 0912 07/20/16 1350 07/20/16 2002 07/21/16 0541  BP: 126/75 (!) 140/57 (!) 133/58 (!) 141/56  Pulse: 63 (!) 55 63 74  Resp:  16 14 16   Temp:  98.6 F (37 C) 99 F (37.2 C) 98.6 F (37 C)  TempSrc:   Oral Oral  SpO2: 99% 99% 98% 98%  Weight:      Height:        Intake/Output Summary (Last 24 hours) at 07/21/16 1013 Last data filed at 07/20/16 1700  Gross per 24 hour  Intake              240 ml  Output                0 ml  Net              240 ml   Wt Readings from Last 3 Encounters:  07/15/16 54.7 kg (120 lb 11.2 oz)  01/11/16 60.3 kg (133 lb)  01/03/16 60.3 kg (133 lb)   Exam  General: Alert and oriented x 3, NAD  HEENT:    Neck: Supple, no JVD  Cardiovascular:  IRRR  Respiratory: Clear to auscultation bilaterally, no wheezing, rales or rhonchi  Gastrointestinal: Soft, nontender, nondistended, + bowel sounds  Data Reviewed:  I have personally reviewed following labs and imaging studies  Radiology Reports Ct Head Wo Contrast  Result Date: 07/13/2016 CLINICAL DATA:  Dizziness prior to a fall today at home. EXAM: CT HEAD WITHOUT CONTRAST TECHNIQUE: Contiguous axial images were obtained from the base of the skull through the vertex without intravenous contrast. COMPARISON:  08/08/2014. 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 Mildly progressive diffuse enlargement of the ventricles and subarachnoid spaces. Patchy white matter low density in both cerebral hemispheres. Old right caudate head lacunar infarct. No intracranial hemorrhage, mass lesion or CT evidence of acute infarction. Vascular: No hyperdense vessel or unexpected calcification. Skull: Normal. Negative for fracture or  focal lesion. Sinuses/Orbits: No acute finding. Other: None. IMPRESSION: 1. No acute abnormality. 2. 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 3. Stable old right caudate head lacunar infarct. 4. Mildly progressive atrophy and chronic small vessel white matter ischemic changes in both cerebral hemispheres. Electronically Signed   By: Claudie Revering M.D.   On: 07/13/2016 17:09   Dg Hip Unilat With Pelvis 2-3 Views Left  Result Date: 07/13/2016 CLINICAL DATA:  Left hip pain following a fall today. EXAM: DG HIP (WITH OR WITHOUT PELVIS) 2-3V LEFT COMPARISON:  12/17/2015. FINDINGS: Interval left femoral  head prosthesis. No fracture or dislocation seen. Diffuse osteopenia. IMPRESSION: No fracture or dislocation. Electronically Signed   By: Claudie Revering M.D.   On: 07/13/2016 17:05   Lab Data:  CBC:  Recent Labs Lab 07/17/16 0308 07/18/16 0423 07/19/16 0225 07/20/16 0754 07/21/16 0436  WBC 5.9 5.3 6.3 6.4 5.8  HGB 10.3* 10.2* 10.5* 11.2* 10.8*  HCT 30.9* 30.3* 31.7* 34.2* 32.3*  MCV 94.8 96.2 95.5 95.5 95.0  PLT 131* 141* 155 167 947    Basic Metabolic Panel:  Recent Labs Lab 07/16/16 0347 07/20/16 0754 07/21/16 0436  NA 138 140 138  K 3.5 3.8 3.6  CL 106 105 104  CO2 26 28 27   GLUCOSE 90 92 90  BUN 22* 20 20  CREATININE 0.98 0.86 0.91  CALCIUM 8.6* 8.7* 8.4*   Coagulation Profile:  Recent Labs Lab 07/17/16 0308 07/18/16 0423 07/19/16 0225 07/20/16 0754 07/21/16 0436  INR 1.76 1.57 1.61 1.77 1.90   Urine analysis:    Component Value Date/Time   COLORURINE YELLOW 07/13/2016 2013   APPEARANCEUR CLEAR 07/13/2016 2013   LABSPEC 1.011 07/13/2016 2013   PHURINE 7.0 07/13/2016 2013   GLUCOSEU NEGATIVE 07/13/2016 2013   HGBUR MODERATE (A) 07/13/2016 2013   BILIRUBINUR NEGATIVE 07/13/2016 2013   [place] 07/13/2016 2013   PROTEINUR NEGATIVE 07/13/2016 2013   UROBILINOGEN 4.0 (H) 08/04/2014 1608   NITRITE NEGATIVE 07/13/2016 2013   LEUKOCYTESUR NEGATIVE  07/13/2016 2013   Faye Ramsay M.D. [facility] 07/21/2016, 10:13 AM  Between 7am to 7pm - call Pager - [phone]  After 7pm go to [email or web address] - password [email or web address]  Call night coverage person covering after 7pm

## 2016-07-21 NOTE — Progress Notes (Signed)
CSW received auth for pt to go to [facility] when stable:  # [id number]  RVB level- auth starts 3/30  auth good through 4/1  Jorge Ny, [place] Social Worker [phone]

## 2016-07-21 NOTE — Progress Notes (Signed)
qPhysical Therapy Treatment Patient Details Name: Amanda Richardson MRN: [id number] DOB: 1923/12/01 Today's Date: 07/21/2016    History of Present Illness Pt is a 83 yo female admitted through ED following a fall on 07/13/16 resulting in a left hip hematoma with no fracture noted. Pt had a fall in 8/17 requiring a hemiarthroplasty but has no reported falls since. PMH significant for HTN, A-fib, Hypothyroidism, and chronic LE edema.    PT Comments    Pt demonstrates some improvement in ability to perform gait and transfers this session. Pt continues to require assistance to get into bed for LE's and requires assistance for safety with gait and balance. Pt is making steady progress toward goals and continues to benefit from short term rehab in order to assist with her return home.     Follow Up Recommendations  SNF;Supervision/Assistance - 24 hour     Equipment Recommendations  None recommended by PT    Recommendations for Other Services       Precautions / Restrictions Precautions Precautions: Fall Precaution Comments: Fall resulted in current hospitalization Restrictions Weight Bearing Restrictions: No    Mobility  Bed Mobility Overal bed mobility: Needs Assistance Bed Mobility: Supine to Sit;Sit to Supine     Supine to sit: Min guard Sit to supine: Min assist   General bed mobility comments: Min A to bring Le's into bed. Able to get EOB without assistance  Transfers Overall transfer level: Needs assistance Equipment used: Rolling walker (2 wheeled) Transfers: Sit to/from Stand Sit to Stand: Min guard         General transfer comment: Min guard for safety from EOB  Ambulation/Gait Ambulation/Gait assistance: Min assist Ambulation Distance (Feet): 50 Feet Assistive device: Rolling walker (2 wheeled) Gait Pattern/deviations: Step-through pattern;Trunk flexed;Narrow base of support;Decreased stride length Gait velocity: decreased Gait velocity interpretation:  Below normal speed for age/gender General Gait Details: cues for forward gaze and proximity of RW; assist to steady and manage RW   Stairs            Wheelchair Mobility    Modified Rankin (Stroke Patients Only)       Balance Overall balance assessment: Needs assistance Sitting-balance support: No upper extremity supported;Feet supported Sitting balance-Leahy Scale: Good Sitting balance - Comments: sitting EOB with no back support   Standing balance support: Bilateral upper extremity supported;No upper extremity supported;During functional activity Standing balance-Leahy Scale: Fair Standing balance comment: strong reliance on RW and unsteady  / fatigues quickly                            Cognition Arousal/Alertness: Awake/alert Behavior During Therapy: WFL for tasks assessed/performed Overall Cognitive Status: History of cognitive impairments - at baseline                                        Exercises      General Comments        Pertinent Vitals/Pain Pain Assessment: No/denies pain    Home Living                      Prior Function            PT Goals (current goals can now be found in the care plan section) Acute Rehab PT Goals Patient Stated Goal: to get outside to pull her weeds Progress towards  PT goals: Progressing toward goals    Frequency    Min 3X/week      PT Plan Current plan remains appropriate;Frequency needs to be updated    Co-evaluation             End of Session Equipment Utilized During Treatment: Gait belt Activity Tolerance: Patient tolerated treatment well Patient left: with call bell/phone within reach;in bed Nurse Communication: Mobility status PT Visit Diagnosis: Difficulty in walking, not elsewhere classified (R26.2)     Time: 3220-2542 PT Time Calculation (min) (ACUTE ONLY): 31 min  Charges:  $Gait Training: 8-22 mins $Therapeutic Activity: 8-22 mins                     G Codes:       [name] PT, DPT  [phone]    [name] 07/21/2016, 4:04 PM

## 2016-07-21 NOTE — Progress Notes (Signed)
Progress Note  Patient Name: Amanda Richardson Date of Encounter: 07/21/2016  Primary Cardiologist: [name]  Subjective   No CV complaints. Comfortable at rest and with light exercise.  Inpatient Medications    Scheduled Meds: . atenolol  25 mg Oral Daily  . calcium-vitamin D  1 tablet Oral QPM  . cholecalciferol  2,000 Units Oral QPM  . furosemide  40 mg Oral Daily  . latanoprost  1 drop Both Eyes QHS  . levothyroxine  50 mcg Oral QAC breakfast  . lisinopril  10 mg Oral Daily  . multivitamin with minerals  1 tablet Oral QPM  . potassium chloride SA  20 mEq Oral Daily  . senna-docusate  1 tablet Oral BID  . Warfarin - Pharmacist Dosing Inpatient   Does not apply q1800   Continuous Infusions: . bivalirudin (ANGIOMAX) infusion 0.5 mg/mL (Non-ACS indications) 0.1 mg/kg/hr (07/21/16 0616)   PRN Meds: acetaminophen **OR** acetaminophen, clobetasol ointment, ondansetron **OR** ondansetron (ZOFRAN) IV, polyethylene glycol   Vital Signs    Vitals:   07/20/16 2002 07/21/16 0541 07/21/16 1119 07/21/16 1500  BP: (!) 133/58 (!) 141/56 (!) 111/43 125/74  Pulse: 63 74 77 85  Resp: 14 16  17   Temp: 99 F (37.2 C) 98.6 F (37 C)  98.9 F (37.2 C)  TempSrc: Oral Oral  Oral  SpO2: 98% 98%  98%  Weight:      Height:        Intake/Output Summary (Last 24 hours) at 07/21/16 1728 Last data filed at 07/21/16 1500  Gross per 24 hour  Intake              472 ml  Output                0 ml  Net              472 ml   Filed Weights   07/15/16 1836  Weight: 54.7 kg (120 lb 11.2 oz)    Telemetry    AFib 60-80s. No pauses seen. - Personally Reviewed  Physical Exam  Comfortable, very slim GEN: No acute distress.   Neck: No JVD Cardiac: irregular, normal prosthetic clicks, no murmurs, rubs, or gallops.  Respiratory: Clear to auscultation bilaterally. GI: Soft, nontender, non-distended  MS: No edema; No deformity. Neuro:  Nonfocal  Psych: Normal affect   Labs     Chemistry Recent Labs Lab 07/16/16 0347 07/20/16 0754 07/21/16 0436  NA 138 140 138  K 3.5 3.8 3.6  CL 106 105 104  CO2 26 28 27   GLUCOSE 90 92 90  BUN 22* 20 20  CREATININE 0.98 0.86 0.91  CALCIUM 8.6* 8.7* 8.4*  GFRNONAA 49* 57* 54*  GFRAA 57* >60 >60  ANIONGAP 6 7 7      Hematology Recent Labs Lab 07/19/16 0225 07/20/16 0754 07/21/16 0436  WBC 6.3 6.4 5.8  RBC 3.32* 3.58* 3.40*  HGB 10.5* 11.2* 10.8*  HCT 31.7* 34.2* 32.3*  MCV 95.5 95.5 95.0  MCH 31.6 31.3 31.8  MCHC 33.1 32.7 33.4  RDW 14.5 14.4 14.6  PLT 155 167 181     Patient Profile     83 y.o. female  with history of mechanical MVR 2001 by Dr. Cyndia Bent on Coumadin PTA, CAD s/p CABG at time of MVR (details unknown), chronic atrial fib, chronic venous lower extremity edema, chronic diastolic CHF, prior strokes seen on CT, prior left nephrectomy, prior ischemic bowel s/p surgery 2004, hypothyroidism, possible DVT 1990s, chronic-appearing anemia  and thrombocytopenia, bullous pemphigoid, prior fall with left hip fx 11/2015 who presented to [facility] 07/13/16 with mechanical fall.  Restarting warfarin while on bivalirudin IV. Repeated pauses of 2-3 seconds and marked bradycardia on monitor (cause of syncope and falls?) led to DC digoxin and reduction in atenolol dose.  Assessment & Plan      1. AFib:  Average resting heart rate in low 60s-70s, 80s when ambulating with PT. Will keep on the current atenolol dose.  2.S/P MVR (mechanical):  warfarin restarted and INR 1.9, suspect will be at target tomorrow and can stop Angiomax.   Signed, Sanda Klein, MD  07/21/2016, 5:28 PM

## 2016-07-21 NOTE — Progress Notes (Signed)
ANTICOAGULATION CONSULT NOTE - FOLLOW UP  Pharmacy Consult:  Bivalirudin Indication:  History of mechanical MVR and Afib  Allergies  Allergen Reactions  . Heparin Anaphylaxis  . Codeine Other (See Comments)    Unknown     Assessment: 89 YOF on Coumadin 3mg  daily exc for 1.5mg  on Mon PTA for Afib (CHADsVASc = 8) + hx mechanical MVR 2001. Admit with large left hip hematoma > Angiomax (anaphylaxis to heparin), Coumadin resumed 3/26.  High bleeding risk with history of falls.  APTT stable at 63, INR up to 1.9. Hgb stable at 10.8, plts wnl. No s/s of bleed.  Goal of Therapy:  INR 2.5-3.5 PTT 50-85 sec Monitor platelets by anticoagulation protocol: Yes   Plan:  Continue bivalirudin at 0.1 mg/kg/hr (= 12 mg/hr) Repeat Coumadin 5mg  PO today Monitor daily INR / aPTT, CBC, s/s of bleed  Elenor Quinones, PharmD, [facility] Clinical Pharmacist Pager [phone] 07/21/2016 7:19 AM

## 2016-07-22 LAB — PROTIME-INR
INR: 2
PROTHROMBIN TIME: 23 s — AB (ref 11.4–15.2)

## 2016-07-22 LAB — CBC
HEMATOCRIT: 30.5 % — AB (ref 36.0–46.0)
Hemoglobin: 10 g/dL — ABNORMAL LOW (ref 12.0–15.0)
MCH: 31.3 pg (ref 26.0–34.0)
MCHC: 32.8 g/dL (ref 30.0–36.0)
MCV: 95.3 fL (ref 78.0–100.0)
PLATELETS: 173 10*3/uL (ref 150–400)
RBC: 3.2 MIL/uL — ABNORMAL LOW (ref 3.87–5.11)
RDW: 14.2 % (ref 11.5–15.5)
WBC: 5.8 10*3/uL (ref 4.0–10.5)

## 2016-07-22 LAB — BASIC METABOLIC PANEL
Anion gap: 7 (ref 5–15)
BUN: 22 mg/dL — ABNORMAL HIGH (ref 6–20)
CALCIUM: 8.5 mg/dL — AB (ref 8.9–10.3)
CO2: 27 mmol/L (ref 22–32)
CREATININE: 0.86 mg/dL (ref 0.44–1.00)
Chloride: 104 mmol/L (ref 101–111)
GFR, EST NON AFRICAN AMERICAN: 57 mL/min — AB (ref >60)
GLUCOSE: 87 mg/dL (ref 65–99)
Potassium: 3.7 mmol/L (ref 3.5–5.1)
Sodium: 138 mmol/L (ref 135–145)

## 2016-07-22 LAB — APTT: APTT: 66 s — AB (ref 24–36)

## 2016-07-22 MED ORDER — WARFARIN SODIUM 7.5 MG PO TABS
7.5000 mg | ORAL_TABLET | Freq: Once | ORAL | Status: AC
  Administered 2016-07-22: 7.5 mg via ORAL
  Filled 2016-07-22: qty 1

## 2016-07-22 NOTE — Progress Notes (Signed)
Progress Note  Patient Name: Amanda Richardson Date of Encounter: 07/22/2016  Primary Cardiologist: [name]  Subjective   Sitting in chair, feels fine. No CP, no SOB. Family in room  Inpatient Medications    Scheduled Meds: . atenolol  25 mg Oral Daily  . calcium-vitamin D  1 tablet Oral QPM  . cholecalciferol  2,000 Units Oral QPM  . furosemide  40 mg Oral Daily  . latanoprost  1 drop Both Eyes QHS  . levothyroxine  50 mcg Oral QAC breakfast  . lisinopril  10 mg Oral Daily  . multivitamin with minerals  1 tablet Oral QPM  . potassium chloride SA  20 mEq Oral Daily  . senna-docusate  1 tablet Oral BID  . Warfarin - Pharmacist Dosing Inpatient   Does not apply q1800   Continuous Infusions: . bivalirudin (ANGIOMAX) infusion 0.5 mg/mL (Non-ACS indications) 0.1 mg/kg/hr (07/21/16 0616)   PRN Meds: acetaminophen **OR** acetaminophen, clobetasol ointment, ondansetron **OR** ondansetron (ZOFRAN) IV, polyethylene glycol   Vital Signs    Vitals:   07/21/16 1500 07/21/16 2207 07/22/16 0640 07/22/16 0853  BP: 125/74 (!) 123/47 (!) 122/105 (!) 111/52  Pulse: 85 62 61 78  Resp: 17     Temp: 98.9 F (37.2 C) 98.9 F (37.2 C) 98.9 F (37.2 C) 98.2 F (36.8 C)  TempSrc: Oral Oral Axillary Oral  SpO2: 98% 95% 94% 100%  Weight:      Height:        Intake/Output Summary (Last 24 hours) at 07/22/16 1017 Last data filed at 07/21/16 1500  Gross per 24 hour  Intake              236 ml  Output                0 ml  Net              236 ml   Filed Weights   07/15/16 1836  Weight: 120 lb 11.2 oz (54.7 kg)    Telemetry    AFIB 60-80 - Personally Reviewed  ECG    AFIB - Personally Reviewed  Physical Exam   GEN: No acute distress. Elderly  Neck: No JVD Cardiac: irreg irreg, no murmurs, rubs, or gallops. S1 click (mechanical MV) Respiratory: Clear to auscultation bilaterally. GI: Soft, nontender, non-distended  MS: No edema; No deformity. Neuro:  Nonfocal  Psych:  Normal affect   Labs    Chemistry Recent Labs Lab 07/20/16 0754 07/21/16 0436 07/22/16 0304  NA 140 138 138  K 3.8 3.6 3.7  CL 105 104 104  CO2 28 27 27   GLUCOSE 92 90 87  BUN 20 20 22*  CREATININE 0.86 0.91 0.86  CALCIUM 8.7* 8.4* 8.5*  GFRNONAA 57* 54* 57*  GFRAA >60 >60 >60  ANIONGAP 7 7 7      Hematology Recent Labs Lab 07/20/16 0754 07/21/16 0436 07/22/16 0304  WBC 6.4 5.8 5.8  RBC 3.58* 3.40* 3.20*  HGB 11.2* 10.8* 10.0*  HCT 34.2* 32.3* 30.5*  MCV 95.5 95.0 95.3  MCH 31.3 31.8 31.3  MCHC 32.7 33.4 32.8  RDW 14.4 14.6 14.2  PLT 167 181 173    Cardiac EnzymesNo results for input(s): TROPONINI in the last 168 hours. No results for input(s): TROPIPOC in the last 168 hours.   BNPNo results for input(s): BNP, PROBNP in the last 168 hours.   DDimer No results for input(s): DDIMER in the last 168 hours.   Radiology  No results found.  Cardiac Studies   No new study  Patient Profile     83 y.o. female with history of mechanical MVR 2001 by Dr. Cyndia Bent on Coumadin PTA, CAD s/p CABG at time of MVR (details unknown), chronic atrial fib, chronic venous lower extremity edema, chronic diastolic CHF, prior strokes seen on CT, prior left nephrectomy, prior ischemic bowel s/p surgery 2004, hypothyroidism, possible DVT 1990s, chronic-appearing anemia and thrombocytopenia, bullous pemphigoid, prior fall with left hip fx 11/2015 who presented to [facility] 07/13/16 with mechanical fall.  Assessment & Plan    Permanent AFIB  - rate controlled  - coumadin  - atenolol  - DC'd Digoxin - 2-3 second pauses previously noted on tele.   - No further pauses noted.   Mechanical MV  - Goal 2.5 - 3.5 INR  - Today 2.0  - Continue Bival until INR 2.5.   - OK to DC after this.   No further recommendations at this time. Will sign off.    Signed, Candee Furbish, MD  07/22/2016, 10:17 AM

## 2016-07-22 NOTE — Progress Notes (Signed)
ANTICOAGULATION CONSULT NOTE - FOLLOW UP  Pharmacy Consult:  Bivalirudin Indication:  History of mechanical MVR and Afib  Allergies  Allergen Reactions  . Heparin Anaphylaxis  . Codeine Other (See Comments)    Unknown     Assessment: 89 YOF on Coumadin 3mg  daily exc for 1.5mg  on Mon PTA for Afib (CHADsVASc = 8) + hx mechanical MVR 2001. Admit with large left hip hematoma > Angiomax (anaphylaxis to heparin), Coumadin resumed 3/26.  High bleeding risk with history of falls.  APTT stable at 66, INR up to 2.0 today. Dose not ordered yesterday. Hgb slowly trending down to 10, plts wnl. No s/s of bleed.  Goal of Therapy:  INR 2.5-3.5 PTT 50-85 sec Monitor platelets by anticoagulation protocol: Yes   Plan:  Continue bivalirudin at 0.1 mg/kg/hr (= 12 mg/hr) Give Coumadin 7.5mg  PO this morning Monitor daily INR / aPTT, CBC, s/s of bleed  Elenor Quinones, PharmD, BCPS Clinical Pharmacist Pager [phone] 07/22/2016 10:22 AM

## 2016-07-22 NOTE — Progress Notes (Signed)
[facility]                                                                             Patient Demographics  Amanda Richardson, is a 83 y.o. female, DOB - 05-27-1923, WUJ:[id number]  Admit date - 07/13/2016   Admitting Physician Orson Eva, MD  Outpatient Primary MD for the patient is Merrilee Seashore, MD  LOS - 8  days    Chief Complaint  Patient presents with  . Fall  . Hip Pain       Brief summary   83 y.o.femalewith medical history significant for atrial fibrillation on coumadin, bullous pemphigoid, hypertension, hypothyroidism who presented to [facility] status post mechanical fall the day of the admission.The patient had unwitnessed fall while she was walking and she fell on her left side. There was no loss of consciousness. The patient did not have any significant pain, but was too weak to get up. She laid on the floor for approximately 2-3 hours prior to her daughter coming back to check up on her. EMS was activated secondary to the patient's weakness and inability to get up.   Assessment & Plan   Principal problem Mechanical fall complicated with left thigh/hip hematoma: -  No acute fractures seen on hip x ray. Patient had a fall in August 2017 requiring left hip hemiarthroplasty. Now fall this admission however no fracture that had large hematoma. - Coumadin was placed on hold, patient has a heparin allergy, hence was placed on IV bivalirudin. Unfortunately disposition is challenging in this patient given large hematoma and mechanical mitral valve necessitating anticoagulation. - Per hematology,Dr Ennever, Lovenox or Arixtra are not options for her.  - Per cardiology, patient will need to be back on anti-coagulation given mechanical mitral valve, since she has been stable on therapeutic IV bivalirudin - Patient will continue IV bivalirudin bridge with Coumadin until INR is in therapeutic range 2.5-3.5  - INR nicely trending up  Active problems History of  mechanical mitral valve replacement 2001 - Hematoma stable and resolving, H&H stable - Started patient on IV bivalirudin (heparin allergy), Coumadin restarted - INR subtherapeutic this AM but overall trending up  - possible d/c to SNF in 1-2 days when INR therapeutic   Diarrhea - stop laxatives - if diarrhea still present after 72 hours of laxative discontinuation, plan on checking C. Diff  Chronic atrial fibrillation - CHADS vasc score at 73 (age, gender, hypertension, CHF) - restarted Coumadin, INR 1.6 this AM - rate controlled on Atenolol, HR 74 this AM   Chronic diastolic CHF - Compensated - 2 D ECHO in 2016 with normal EF - cardiology team has signed off  Hypertension,essential  - Continue atenolol and lisinopril - reasonable inpatient control   Hypothyroidism - Continue synthroid   Chronic lower extremity edema - Venous duplex left lower extremity due to worsening left lower extremity edema negative for DVT  Bullous pemphigoid - Continue Temovate, niacinamide   Thrombocytopenia - monitor in the setting of anticoagulation  - resolved at this point   Code Status: Full CODE STATUS DVT Prophylaxis:  On IV  bivalirudin. Family Communication: Discussed in detail with the patient, d/c tele  Disposition Plan: when INR in the therapeutic range  Procedures:   None  Consultants:    Hematology  Cardiology  Antimicrobials:   None  Medications: Scheduled Meds: . atenolol  25 mg Oral Daily  . calcium-vitamin D  1 tablet Oral QPM  . cholecalciferol  2,000 Units Oral QPM  . furosemide  40 mg Oral Daily  . latanoprost  1 drop Both Eyes QHS  . levothyroxine  50 mcg Oral QAC breakfast  . lisinopril  10 mg Oral Daily  . multivitamin with minerals  1 tablet Oral QPM  . potassium chloride SA  20 mEq Oral Daily  . senna-docusate  1 tablet Oral BID  . warfarin  7.5 mg Oral ONCE-1800  . Warfarin - Pharmacist Dosing Inpatient   Does not apply q1800    Continuous Infusions: . bivalirudin (ANGIOMAX) infusion 0.5 mg/mL (Non-ACS indications) 0.1 mg/kg/hr (07/21/16 0616)   PRN Meds:.acetaminophen **OR** acetaminophen, clobetasol ointment, ondansetron **OR** ondansetron (ZOFRAN) IV, polyethylene glycol  Subjective:   Patient denies dizziness, chest pain, shortness of breath, abdominal pain.  Objective:   Vitals:   07/21/16 1500 07/21/16 2207 07/22/16 0640 07/22/16 0853  BP: 125/74 (!) 123/47 (!) 122/105 (!) 111/52  Pulse: 85 62 61 78  Resp: 17     Temp: 98.9 F (37.2 C) 98.9 F (37.2 C) 98.9 F (37.2 C) 98.2 F (36.8 C)  TempSrc: Oral Oral Axillary Oral  SpO2: 98% 95% 94% 100%  Weight:      Height:        Intake/Output Summary (Last 24 hours) at 07/22/16 1044 Last data filed at 07/21/16 1500  Gross per 24 hour  Intake              236 ml  Output                0 ml  Net              236 ml   Wt Readings from Last 3 Encounters:  07/15/16 54.7 kg (120 lb 11.2 oz)  01/11/16 60.3 kg (133 lb)  01/03/16 60.3 kg (133 lb)   Exam  General: Alert and oriented x 3, NAD  HEENT:    Neck: Supple, no JVD  Cardiovascular:  IRRR  Respiratory: Clear to auscultation bilaterally, no wheezing, rales or rhonchi  Gastrointestinal: Soft, nontender, nondistended, + bowel sounds  Data Reviewed:  I have personally reviewed following labs and imaging studies  Radiology Reports Ct Head Wo Contrast  Result Date: 07/13/2016 CLINICAL DATA:  Dizziness prior to a fall today at home. EXAM: CT HEAD WITHOUT CONTRAST TECHNIQUE: Contiguous axial images were obtained from the base of the skull through the vertex without intravenous contrast. COMPARISON:  08/08/2014. 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 Mildly progressive diffuse enlargement of the ventricles and subarachnoid spaces. Patchy white matter low density in both cerebral  hemispheres. Old right caudate head lacunar infarct. No intracranial hemorrhage, mass lesion or CT evidence of acute infarction. Vascular: No hyperdense vessel or unexpected calcification. Skull: Normal. Negative for fracture or focal lesion. Sinuses/Orbits: No acute finding. Other: None. IMPRESSION: 1. No acute abnormality. 2. 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 3. Stable old right caudate head lacunar infarct. 4. Mildly progressive atrophy and chronic small vessel white matter ischemic changes in both cerebral hemispheres. Electronically Signed   By: Claudie Revering M.D.   On: 07/13/2016 17:09   Dg Hip Unilat  With Pelvis 2-3 Views Left  Result Date: 07/13/2016 CLINICAL DATA:  Left hip pain following a fall today. EXAM: DG HIP (WITH OR WITHOUT PELVIS) 2-3V LEFT COMPARISON:  12/17/2015. FINDINGS: Interval left femoral head prosthesis. No fracture or dislocation seen. Diffuse osteopenia. IMPRESSION: No fracture or dislocation. Electronically Signed   By: Claudie Revering M.D.   On: 07/13/2016 17:05   Lab Data:  CBC:  Recent Labs Lab 07/18/16 0423 07/19/16 0225 07/20/16 0754 07/21/16 0436 07/22/16 0304  WBC 5.3 6.3 6.4 5.8 5.8  HGB 10.2* 10.5* 11.2* 10.8* 10.0*  HCT 30.3* 31.7* 34.2* 32.3* 30.5*  MCV 96.2 95.5 95.5 95.0 95.3  PLT 141* 155 167 181 256    Basic Metabolic Panel:  Recent Labs Lab 07/16/16 0347 07/20/16 0754 07/21/16 0436 07/22/16 0304  NA 138 140 138 138  K 3.5 3.8 3.6 3.7  CL 106 105 104 104  CO2 26 28 27 27   GLUCOSE 90 92 90 87  BUN 22* 20 20 22*  CREATININE 0.98 0.86 0.91 0.86  CALCIUM 8.6* 8.7* 8.4* 8.5*   Coagulation Profile:  Recent Labs Lab 07/18/16 0423 07/19/16 0225 07/20/16 0754 07/21/16 0436 07/22/16 0304  INR 1.57 1.61 1.77 1.90 2.00   Urine analysis:    Component Value Date/Time   COLORURINE YELLOW 07/13/2016 2013   APPEARANCEUR CLEAR 07/13/2016 2013   LABSPEC 1.011 07/13/2016 2013   PHURINE 7.0 07/13/2016 2013   GLUCOSEU NEGATIVE  07/13/2016 2013   HGBUR MODERATE (A) 07/13/2016 2013   BILIRUBINUR NEGATIVE 07/13/2016 2013   [place] NEGATIVE 07/13/2016 2013   PROTEINUR NEGATIVE 07/13/2016 2013   UROBILINOGEN 4.0 (H) 08/04/2014 1608   NITRITE NEGATIVE 07/13/2016 2013   LEUKOCYTESUR NEGATIVE 07/13/2016 2013   Faye Ramsay M.D. [facility] 07/22/2016, 10:44 AM  Between 7am to 7pm - call Pager - [phone]  After 7pm go to [email or web address] - password [email or web address]  Call night coverage person covering after 7pm

## 2016-07-23 LAB — CBC
HEMATOCRIT: 29.8 % — AB (ref 36.0–46.0)
Hemoglobin: 9.9 g/dL — ABNORMAL LOW (ref 12.0–15.0)
MCH: 31.7 pg (ref 26.0–34.0)
MCHC: 33.2 g/dL (ref 30.0–36.0)
MCV: 95.5 fL (ref 78.0–100.0)
PLATELETS: 168 10*3/uL (ref 150–400)
RBC: 3.12 MIL/uL — ABNORMAL LOW (ref 3.87–5.11)
RDW: 14.5 % (ref 11.5–15.5)
WBC: 5.3 10*3/uL (ref 4.0–10.5)

## 2016-07-23 LAB — PROTIME-INR
INR: 1.99
Prothrombin Time: 22.8 seconds — ABNORMAL HIGH (ref 11.4–15.2)

## 2016-07-23 LAB — APTT: APTT: 60 s — AB (ref 24–36)

## 2016-07-23 MED ORDER — WARFARIN SODIUM 7.5 MG PO TABS
7.5000 mg | ORAL_TABLET | Freq: Once | ORAL | Status: AC
  Administered 2016-07-23: 7.5 mg via ORAL
  Filled 2016-07-23: qty 1

## 2016-07-23 NOTE — Progress Notes (Addendum)
[facility]                                                                             Patient Demographics  Amanda Richardson, is a 83 y.o. female, DOB - 1923/06/25, YDX:[id number]  Admit date - 07/13/2016   Admitting Physician Orson Eva, MD  Outpatient Primary MD for the patient is Merrilee Seashore, MD  LOS - 9  days    Chief Complaint  Patient presents with  . Fall  . Hip Pain       Brief summary   83 y.o.femalewith medical history significant for atrial fibrillation on coumadin, bullous pemphigoid, hypertension, hypothyroidism who presented to [facility] status post mechanical fall the day of the admission.The patient had unwitnessed fall while she was walking and she fell on her left side. There was no loss of consciousness. The patient did not have any significant pain, but was too weak to get up. She laid on the floor for approximately 2-3 hours prior to her daughter coming back to check up on her. EMS was activated secondary to the patient's weakness and inability to get up.   Assessment & Plan   Principal problem Mechanical fall complicated with left thigh/hip hematoma: -  No acute fractures seen on hip x ray. Patient had a fall in August 2017 requiring left hip hemiarthroplasty. Now fall this admission however no fracture that had large hematoma. - Coumadin was placed on hold, patient has a heparin allergy, hence was placed on IV bivalirudin. Unfortunately disposition is challenging in this patient given large hematoma and mechanical mitral valve necessitating anticoagulation. - Per hematology,Dr Ennever, Lovenox or Arixtra are not options for her.  - Per cardiology, patient will need to be back on anti-coagulation given mechanical mitral valve, since she has been stable on therapeutic IV bivalirudin - Patient will continue IV bivalirudin bridge with Coumadin until INR is in therapeutic range 2.5-3.5  - INR still not therapeutic and even more down this AM,  appreciate pharmacy assistance   Active problems History of mechanical mitral valve replacement 2001 - Hematoma stable and resolving, H&H stable - Started patient on IV bivalirudin (heparin allergy), Coumadin restarted - INR subtherapeutic this AM - possible d/c to SNF in 1-2 days when INR therapeutic   Diarrhea - stopped laxatives - pt reports overall improvement   Nasal bleeding - pt says it was mild,now resolved - will monitor   Chronic atrial fibrillation - CHADS vasc score at 59 (age, gender, hypertension, CHF) - restarted Coumadin, INR 1.6 this AM - rate controlled on Atenolol, HR 71 this AM   Chronic diastolic CHF - Compensated - 2 D ECHO in 2016 with normal EF - cardiology team has signed off as no further recommendations   Hypertension,essential  - Continue atenolol and lisinopril - reasonable inpatient control   Hypothyroidism - Continue synthroid   Chronic lower extremity edema - Venous duplex left lower extremity due to worsening left lower extremity edema negative for DVT  Bullous pemphigoid - Continue Temovate, niacinamide   Thrombocytopenia - monitor in the setting of anticoagulation  - resolved at this point   Code Status: Full CODE STATUS DVT Prophylaxis:  On  IV  bivalirudin. Family Communication: Discussed in detail with the patient left my number for daughter to call me, also left message on daughter's cell phone to call me back   Disposition Plan: when INR in the therapeutic range  Procedures:   None  Consultants:    Hematology  Cardiology  Antimicrobials:   None  Medications: Scheduled Meds: . atenolol  25 mg Oral Daily  . calcium-vitamin D  1 tablet Oral QPM  . cholecalciferol  2,000 Units Oral QPM  . furosemide  40 mg Oral Daily  . latanoprost  1 drop Both Eyes QHS  . levothyroxine  50 mcg Oral QAC breakfast  . lisinopril  10 mg Oral Daily  . multivitamin with minerals  1 tablet Oral QPM  . potassium chloride SA   20 mEq Oral Daily  . senna-docusate  1 tablet Oral BID  . warfarin  7.5 mg Oral ONCE-1800  . Warfarin - Pharmacist Dosing Inpatient   Does not apply q1800   Continuous Infusions: . bivalirudin (ANGIOMAX) infusion 0.5 mg/mL (Non-ACS indications) 0.1 mg/kg/hr (07/23/16 0151)   PRN Meds:.acetaminophen **OR** acetaminophen, clobetasol ointment, ondansetron **OR** ondansetron (ZOFRAN) IV, polyethylene glycol  Subjective:   Had some nasal bleeding last night. Now resolved   Objective:   Vitals:   07/22/16 0640 07/22/16 0853 07/22/16 1533 07/22/16 2210  BP: (!) 122/105 (!) 111/52 (!) 116/48 140/71  Pulse: 61 78 63 71  Resp:   16 16  Temp: 98.9 F (37.2 C) 98.2 F (36.8 C) 97.8 F (36.6 C) 98.3 F (36.8 C)  TempSrc: Axillary Oral Oral Oral  SpO2: 94% 100% 97% 99%  Weight:      Height:        Intake/Output Summary (Last 24 hours) at 07/23/16 1010 Last data filed at 07/22/16 1500  Gross per 24 hour  Intake                0 ml  Output              300 ml  Net             -300 ml   Wt Readings from Last 3 Encounters:  07/15/16 54.7 kg (120 lb 11.2 oz)  01/11/16 60.3 kg (133 lb)  01/03/16 60.3 kg (133 lb)   Exam  General: Alert and oriented x 3, NAD  HEENT:    Neck: Supple, no JVD  Cardiovascular:  IRRR  Respiratory: Clear to auscultation bilaterally, no wheezing, rales or rhonchi  Gastrointestinal: Soft, nontender, nondistended, + bowel sounds  Data Reviewed:  I have personally reviewed following labs and imaging studies  Radiology Reports Ct Head Wo Contrast  Result Date: 07/13/2016 CLINICAL DATA:  Dizziness prior to a fall today at home. EXAM: CT HEAD WITHOUT CONTRAST TECHNIQUE: Contiguous axial images were obtained from the base of the skull through the vertex without intravenous contrast. COMPARISON:  08/08/2014. 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 Mildly  progressive diffuse enlargement of the ventricles and subarachnoid spaces. Patchy white matter low density in both cerebral hemispheres. Old right caudate head lacunar infarct. No intracranial hemorrhage, mass lesion or CT evidence of acute infarction. Vascular: No hyperdense vessel or unexpected calcification. Skull: Normal. Negative for fracture or focal lesion. Sinuses/Orbits: No acute finding. Other: None. IMPRESSION: 1. No acute abnormality. 2. 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 3. Stable old right caudate head lacunar infarct. 4. Mildly progressive atrophy and  chronic small vessel white matter ischemic changes in both cerebral hemispheres. Electronically Signed   By: Claudie Revering M.D.   On: 07/13/2016 17:09   Dg Hip Unilat With Pelvis 2-3 Views Left  Result Date: 07/13/2016 CLINICAL DATA:  Left hip pain following a fall today. EXAM: DG HIP (WITH OR WITHOUT PELVIS) 2-3V LEFT COMPARISON:  12/17/2015. FINDINGS: Interval left femoral head prosthesis. No fracture or dislocation seen. Diffuse osteopenia. IMPRESSION: No fracture or dislocation. Electronically Signed   By: Claudie Revering M.D.   On: 07/13/2016 17:05   Lab Data:  CBC:  Recent Labs Lab 07/19/16 0225 07/20/16 0754 07/21/16 0436 07/22/16 0304 07/23/16 0500  WBC 6.3 6.4 5.8 5.8 5.3  HGB 10.5* 11.2* 10.8* 10.0* 9.9*  HCT 31.7* 34.2* 32.3* 30.5* 29.8*  MCV 95.5 95.5 95.0 95.3 95.5  PLT 155 167 181 173 203    Basic Metabolic Panel:  Recent Labs Lab 07/20/16 0754 07/21/16 0436 07/22/16 0304  NA 140 138 138  K 3.8 3.6 3.7  CL 105 104 104  CO2 28 27 27   GLUCOSE 92 90 87  BUN 20 20 22*  CREATININE 0.86 0.91 0.86  CALCIUM 8.7* 8.4* 8.5*   Coagulation Profile:  Recent Labs Lab 07/19/16 0225 07/20/16 0754 07/21/16 0436 07/22/16 0304 07/23/16 0500  INR 1.61 1.77 1.90 2.00 1.99   Urine analysis:    Component Value Date/Time   COLORURINE YELLOW 07/13/2016 2013   APPEARANCEUR CLEAR 07/13/2016 2013   LABSPEC  1.011 07/13/2016 2013   PHURINE 7.0 07/13/2016 2013   GLUCOSEU NEGATIVE 07/13/2016 2013   HGBUR MODERATE (A) 07/13/2016 2013   BILIRUBINUR NEGATIVE 07/13/2016 2013   [place] NEGATIVE 07/13/2016 2013   PROTEINUR NEGATIVE 07/13/2016 2013   UROBILINOGEN 4.0 (H) 08/04/2014 1608   NITRITE NEGATIVE 07/13/2016 2013   LEUKOCYTESUR NEGATIVE 07/13/2016 2013   Faye Ramsay M.D. [facility] 07/23/2016, 10:10 AM  Between 7am to 7pm - call Pager - [phone]  After 7pm go to [email or web address] - password [email or web address]  Call night coverage person covering after 7pm

## 2016-07-23 NOTE — Progress Notes (Signed)
ANTICOAGULATION CONSULT NOTE - FOLLOW UP  Pharmacy Consult:  Bivalirudin Indication:  History of mechanical MVR and Afib  Allergies  Allergen Reactions  . Heparin Anaphylaxis  . Codeine Other (See Comments)    Unknown     Assessment: 89 YOF on Coumadin 3mg  daily exc for 1.5mg  on Mon PTA for Afib (CHADsVASc = 8) + hx mechanical MVR 2001. Admit with large left hip hematoma > Angiomax (anaphylaxis to heparin), Coumadin resumed 3/26.  High bleeding risk with history of falls.  APTT stable at 60, INR has stopped trending up at 1.99 today. Hgb slowly trending down to 9.9, plts wnl. No s/s of bleed.  Goal of Therapy:  INR 2.5-3.5 PTT 50-85 sec Monitor platelets by anticoagulation protocol: Yes   Plan:  Continue bivalirudin at 0.1 mg/kg/hr (= 12 mg/hr) Give Coumadin 7.5mg  PO today Monitor daily INR / aPTT, CBC, s/s of bleed  Elenor Quinones, PharmD, [facility] Clinical Pharmacist Pager [phone] 07/23/2016 7:54 AM

## 2016-07-24 LAB — CBC
HCT: 30.6 % — ABNORMAL LOW (ref 36.0–46.0)
Hemoglobin: 10.2 g/dL — ABNORMAL LOW (ref 12.0–15.0)
MCH: 31.6 pg (ref 26.0–34.0)
MCHC: 33.3 g/dL (ref 30.0–36.0)
MCV: 94.7 fL (ref 78.0–100.0)
PLATELETS: 176 10*3/uL (ref 150–400)
RBC: 3.23 MIL/uL — ABNORMAL LOW (ref 3.87–5.11)
RDW: 14.4 % (ref 11.5–15.5)
WBC: 4.7 10*3/uL (ref 4.0–10.5)

## 2016-07-24 LAB — BASIC METABOLIC PANEL
Anion gap: 7 (ref 5–15)
BUN: 20 mg/dL (ref 6–20)
CALCIUM: 8.2 mg/dL — AB (ref 8.9–10.3)
CO2: 26 mmol/L (ref 22–32)
Chloride: 106 mmol/L (ref 101–111)
Creatinine, Ser: 0.76 mg/dL (ref 0.44–1.00)
GFR calc Af Amer: >60 mL/min (ref >60)
GLUCOSE: 80 mg/dL (ref 65–99)
POTASSIUM: 3.9 mmol/L (ref 3.5–5.1)
Sodium: 139 mmol/L (ref 135–145)

## 2016-07-24 LAB — APTT: aPTT: 65 seconds — ABNORMAL HIGH (ref 24–36)

## 2016-07-24 LAB — PROTIME-INR
INR: 2.28
PROTHROMBIN TIME: 25.5 s — AB (ref 11.4–15.2)

## 2016-07-24 MED ORDER — WARFARIN SODIUM 7.5 MG PO TABS
7.5000 mg | ORAL_TABLET | Freq: Once | ORAL | Status: AC
  Administered 2016-07-24: 7.5 mg via ORAL
  Filled 2016-07-24 (×2): qty 1

## 2016-07-24 NOTE — Progress Notes (Signed)
Pt up with Therapy.

## 2016-07-24 NOTE — Progress Notes (Signed)
[facility]                                                                             Patient Demographics  Amanda Richardson, is a 83 y.o. female, DOB - [birth year], [id number]:[id number]  Admit date - 07/13/2016   Admitting Physician Orson Eva, MD  Outpatient Primary MD for the patient is Merrilee Seashore, MD  LOS - 10  days    Chief Complaint  Patient presents with  . Fall  . Hip Pain       Brief summary   83 y.o.femalewith medical history significant for atrial fibrillation on coumadin, bullous pemphigoid, hypertension, hypothyroidism who presented to [facility] status post mechanical fall the day of the admission.The patient had unwitnessed fall while she was walking and she fell on her left side. There was no loss of consciousness. The patient did not have any significant pain, but was too weak to get up. She laid on the floor for approximately 2-3 hours prior to her daughter coming back to check up on her. EMS was activated secondary to the patient's weakness and inability to get up.   Assessment & Plan   Principal problem Mechanical fall complicated with left thigh/hip hematoma: -  No acute fractures seen on hip x ray. Patient had a fall in August 2017 requiring left hip hemiarthroplasty. Now fall this admission however no fracture that had large hematoma. - Coumadin was placed on hold, patient has a heparin allergy, hence was placed on IV bivalirudin. Unfortunately disposition is challenging in this patient given large hematoma and mechanical mitral valve necessitating anticoagulation. - Per hematology,Dr Ennever, Lovenox or Arixtra are not options for her.  - Per cardiology, patient will need to be back on anti-coagulation given mechanical mitral valve, since she has been stable on therapeutic IV bivalirudin - Patient will continue IV bivalirudin bridge with Coumadin until INR is in therapeutic range 2.5-3.5  - INR still not therapeutic but better this AM   Active  problems History of mechanical mitral valve replacement 2001 - Hematoma stable and resolving, H&H stable - Started patient on IV bivalirudin (heparin allergy), Coumadin restarted - INR subtherapeutic this AM - possible d/c to SNF in am   Diarrhea - stopped laxatives - improving per pt, will discuss with RN   Nasal bleeding - pt says it was mild - now resolved   Chronic atrial fibrillation - CHADS vasc score at 84 (age, gender, hypertension, CHF) - restarted Coumadin, INR 2.28 this AM - rate controlled on Atenolol, HR 72 this AM   Chronic diastolic CHF - Compensated - 2 D ECHO in 2016 with normal EF - cardiology team has signed off as no further recommendations   Hypertension,essential  - Continue atenolol and lisinopril - reasonable inpatient control   Hypothyroidism - Continue synthroid   Chronic lower extremity edema - Venous duplex left lower extremity due to worsening left lower extremity edema negative for DVT  Bullous pemphigoid - Continue Temovate, niacinamide   Thrombocytopenia - monitor in the setting of anticoagulation  - resolved at this point   Code Status: Full CODE STATUS DVT Prophylaxis:  On IV  bivalirudin. Family Communication: Discussed in  detail with the patient, daughter updated over the phone   Disposition Plan: when INR in the therapeutic range, suspect in AM  Procedures:   None  Consultants:    Hematology  Cardiology  Antimicrobials:   None  Medications: Scheduled Meds: . atenolol  25 mg Oral Daily  . calcium-vitamin D  1 tablet Oral QPM  . cholecalciferol  2,000 Units Oral QPM  . furosemide  40 mg Oral Daily  . latanoprost  1 drop Both Eyes QHS  . levothyroxine  50 mcg Oral QAC breakfast  . lisinopril  10 mg Oral Daily  . multivitamin with minerals  1 tablet Oral QPM  . potassium chloride SA  20 mEq Oral Daily  . senna-docusate  1 tablet Oral BID  . warfarin  7.5 mg Oral ONCE-1800  . Warfarin - Pharmacist Dosing  Inpatient   Does not apply q1800   Continuous Infusions: . bivalirudin (ANGIOMAX) infusion 0.5 mg/mL (Non-ACS indications) 0.1 mg/kg/hr (07/23/16 0151)   PRN Meds:.acetaminophen **OR** acetaminophen, clobetasol ointment, ondansetron **OR** ondansetron (ZOFRAN) IV, polyethylene glycol  Subjective:   Had some nasal bleeding. Now resolved   Objective:   Vitals:   07/22/16 2210 07/23/16 1626 07/23/16 2207 07/24/16 0707  BP: 140/71 (!) 128/49 (!) 134/52 (!) 126/54  Pulse: 71 65 68 72  Resp: 16 16 16 16   Temp: 98.3 F (36.8 C) 98.9 F (37.2 C) 97.8 F (36.6 C) 98.2 F (36.8 C)  TempSrc: Oral Oral Oral Oral  SpO2: 99% 100% 99% 99%  Weight:      Height:       No intake or output data in the 24 hours ending 07/24/16 0933 Wt Readings from Last 3 Encounters:  07/15/16 54.7 kg (120 lb 11.2 oz)  01/11/16 60.3 kg (133 lb)  01/03/16 60.3 kg (133 lb)   Exam  General: Alert and oriented x 3, NAD  HEENT:    Neck: Supple, no JVD  Cardiovascular:  IRRR  Respiratory: Clear to auscultation bilaterally, no wheezing, rales or rhonchi  Gastrointestinal: Soft, nontender, nondistended, + bowel sounds  Data Reviewed:  I have personally reviewed following labs and imaging studies  Radiology Reports Ct Head Wo Contrast  Result Date: 07/13/2016 CLINICAL DATA:  Dizziness prior to a fall today at home. EXAM: CT HEAD WITHOUT CONTRAST TECHNIQUE: Contiguous axial images were obtained from the base of the skull through the vertex without intravenous contrast. COMPARISON:  08/08/2014. 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 Mildly progressive diffuse enlargement of the ventricles and subarachnoid spaces. Patchy white matter low density in both cerebral hemispheres. Old right caudate head lacunar infarct. No intracranial hemorrhage, mass lesion or CT evidence of acute infarction. Vascular: No  hyperdense vessel or unexpected calcification. Skull: Normal. Negative for fracture or focal lesion. Sinuses/Orbits: No acute finding. Other: None. IMPRESSION: 1. No acute abnormality. 2. 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 3. Stable old right caudate head lacunar infarct. 4. Mildly progressive atrophy and chronic small vessel white matter ischemic changes in both cerebral hemispheres. Electronically Signed   By: Claudie Revering M.D.   On: 07/13/2016 17:09   Dg Hip Unilat With Pelvis 2-3 Views Left  Result Date: 07/13/2016 CLINICAL DATA:  Left hip pain following a fall today. EXAM: DG HIP (WITH OR WITHOUT PELVIS) 2-3V LEFT COMPARISON:  12/17/2015. FINDINGS: Interval left femoral head prosthesis. No fracture or dislocation seen. Diffuse osteopenia. IMPRESSION: No fracture or dislocation. Electronically Signed  By: Claudie Revering M.D.   On: 07/13/2016 17:05   Lab Data:  CBC:  Recent Labs Lab 07/20/16 0754 07/21/16 0436 07/22/16 0304 07/23/16 0500 07/24/16 0511  WBC 6.4 5.8 5.8 5.3 4.7  HGB 11.2* 10.8* 10.0* 9.9* 10.2*  HCT 34.2* 32.3* 30.5* 29.8* 30.6*  MCV 95.5 95.0 95.3 95.5 94.7  PLT 167 181 173 168 144    Basic Metabolic Panel:  Recent Labs Lab 07/20/16 0754 07/21/16 0436 07/22/16 0304 07/24/16 0511  NA 140 138 138 139  K 3.8 3.6 3.7 3.9  CL 105 104 104 106  CO2 28 27 27 26   GLUCOSE 92 90 87 80  BUN 20 20 22* 20  CREATININE 0.86 0.91 0.86 0.76  CALCIUM 8.7* 8.4* 8.5* 8.2*   Coagulation Profile:  Recent Labs Lab 07/20/16 0754 07/21/16 0436 07/22/16 0304 07/23/16 0500 07/24/16 0511  INR 1.77 1.90 2.00 1.99 2.28   Urine analysis:    Component Value Date/Time   COLORURINE YELLOW 07/13/2016 2013   APPEARANCEUR CLEAR 07/13/2016 2013   LABSPEC 1.011 07/13/2016 2013   PHURINE 7.0 07/13/2016 2013   GLUCOSEU NEGATIVE 07/13/2016 2013   HGBUR MODERATE (A) 07/13/2016 2013   BILIRUBINUR NEGATIVE 07/13/2016 2013   [place] NEGATIVE 07/13/2016 2013   PROTEINUR  NEGATIVE 07/13/2016 2013   UROBILINOGEN 4.0 (H) 08/04/2014 1608   NITRITE NEGATIVE 07/13/2016 2013   LEUKOCYTESUR NEGATIVE 07/13/2016 2013   Faye Ramsay M.D. [facility] 07/24/2016, 9:33 AM  Between 7am to 7pm - call Pager - [phone]  After 7pm go to [email or web address] - password [email or web address]  Call night coverage person covering after 7pm

## 2016-07-24 NOTE — Progress Notes (Signed)
qPhysical Therapy Treatment Patient Details Name: Amanda Richardson MRN: [id number] DOB: [birth year] Today's Date: 07/24/2016    History of Present Illness Pt is a 83 yo female admitted through ED following a fall on 07/13/16 resulting in a left hip hematoma with no fracture noted. Pt had a fall in 8/17 requiring a hemiarthroplasty but has no reported falls since. PMH significant for HTN, A-fib, Hypothyroidism, and chronic LE edema.    PT Comments    Patient continues to require cues for safe use of AD and to use at all times with OOB mobility. Pt continues to be at high risk of falls due to strength and balance deficits as well as cognitive impairment. Continue to recommend SNF for further skilled PT services to maximize independence and safety with mobility.    Follow Up Recommendations  SNF;Supervision/Assistance - 24 hour     Equipment Recommendations  None recommended by PT    Recommendations for Other Services       Precautions / Restrictions Precautions Precautions: Fall Precaution Comments: Fall resulted in current hospitalization Restrictions Weight Bearing Restrictions: No    Mobility  Bed Mobility Overal bed mobility: Needs Assistance Bed Mobility: Supine to Sit     Supine to sit: Min guard     General bed mobility comments: increased time and effort; use of rails; min guard for safety  Transfers Overall transfer level: Needs assistance Equipment used: Rolling walker (2 wheeled) Transfers: Sit to/from Stand Sit to Stand: Min assist         General transfer comment: assist to power up into standing and to gain balance upon stand; pt with posterior lean upon standing; cues for safe hand placement and use of AD; cues and assist to use RW when going to sit; pt tends to leave RW far away from destination  Ambulation/Gait Ambulation/Gait assistance: Min assist Ambulation Distance (Feet): 100 Feet Assistive device: Rolling walker (2 wheeled) Gait  Pattern/deviations: Step-through pattern;Trunk flexed;Narrow base of support;Decreased stride length Gait velocity: decreased   General Gait Details: cues for posture/forward gaze and safe proximity of RW; assist to manage RW with turning   Stairs            Wheelchair Mobility    Modified Rankin (Stroke Patients Only)       Balance Overall balance assessment: Needs assistance Sitting-balance support: No upper extremity supported;Feet supported Sitting balance-Leahy Scale: Good Sitting balance - Comments: sitting EOB with no back support   Standing balance support: Bilateral upper extremity supported;During functional activity;Single extremity supported Standing balance-Leahy Scale: Fair Standing balance comment: pt able to stand with single UE support for hygiene; LOB X1 when performing hygiene in standing and needed cues for safety                            Cognition Arousal/Alertness: Awake/alert Behavior During Therapy: WFL for tasks assessed/performed Overall Cognitive Status: History of cognitive impairments - at baseline                                        Exercises      General Comments General comments (skin integrity, edema, etc.): pt incontinent of urine upon standing from EOB      Pertinent Vitals/Pain Pain Assessment: No/denies pain    Home Living  Prior Function            PT Goals (current goals can now be found in the care plan section) Progress towards PT goals: Progressing toward goals    Frequency    Min 3X/week      PT Plan Current plan remains appropriate;Frequency needs to be updated    Co-evaluation             End of Session Equipment Utilized During Treatment: Gait belt Activity Tolerance: Patient tolerated treatment well Patient left: with call bell/phone within reach;in chair;with family/visitor present Nurse Communication: Mobility status PT Visit  Diagnosis: Difficulty in walking, not elsewhere classified (R26.2)     Time: 3128-1188 PT Time Calculation (min) (ACUTE ONLY): 46 min  Charges:  $Gait Training: 23-37 mins $Therapeutic Activity: 8-22 mins                    G Codes:       [name], PTA Pager: [phone]     [name] 07/24/2016, 4:36 PM

## 2016-07-24 NOTE — Progress Notes (Signed)
Occupational Therapy Treatment Patient Details Name: Amanda Richardson MRN: [id number] DOB: May 03, 1923 Today's Date: 07/24/2016    History of present illness Pt is a 83 yo female admitted through ED following a fall on 07/13/16 resulting in a left hip hematoma with no fracture noted. Pt had a fall in 8/17 requiring a hemiarthroplasty but has no reported falls since. PMH significant for HTN, A-fib, Hypothyroidism, and chronic LE edema.   OT comments  Pt is making steady progress towards goals.  Pt currently requires min guard with ambulation and toilet transfers with use of RW.  Min-mod cues for safety with RW, as pt with tendency to leave RW aside and family reports that she does not use RW at home.  Engaged in discussion regarding recommendation for use of RW at this time and increased safety and independence provided.  Educated on use of AE to increase independence with LB dressing, requiring mod assist and cues to complete, to ensure adherence to hip precautions.    Follow Up Recommendations  SNF    Equipment Recommendations  Other (comment) (defer to next venue of care)    Recommendations for Other Services      Precautions / Restrictions Precautions Precautions: Fall Precaution Comments: Fall resulted in current hospitalization       Mobility Transfers Overall transfer level: Needs assistance Equipment used: Rolling walker (2 wheeled) Transfers: Sit to/from Stand Sit to Stand: Min guard                  ADL either performed or assessed with clinical judgement   ADL Overall ADL's : Needs assistance/impaired     Grooming: Wash/dry hands;Wash/dry face;Supervision/safety;Standing               Lower Body Dressing: Total assistance Lower Body Dressing Details (indicate cue type and reason): don/doff socks.  Educated on use of AE to increase inependence and adherence to hip precautions Toilet Transfer: Min guard;Ambulation;RW Toilet Transfer Details (indicate cue  type and reason): Amublated into bathroom with RW with min guard         Functional mobility during ADLs: Min guard;Rolling walker General ADL Comments: Pt's daughter reports pt not using RW at home, more furniture walking.  Discussed increased use of RW for increased safety and stability due to high fall risk and frequent falls               Cognition Arousal/Alertness: Awake/alert Behavior During Therapy: WFL for tasks assessed/performed Overall Cognitive Status: History of cognitive impairments - at baseline                                                     Pertinent Vitals/ Pain       Pain Assessment: No/denies pain         Frequency  Min 2X/week        Progress Toward Goals  OT Goals(current goals can now be found in the care plan section)  Progress towards OT goals: Progressing toward goals  Acute Rehab OT Goals Patient Stated Goal: to get outside to pull her weeds OT Goal Formulation: With patient Time For Goal Achievement: 07/30/16 Potential to Achieve Goals: Good  Plan Discharge plan remains appropriate       End of Session Equipment Utilized During Treatment: Gait belt;Rolling walker  OT Visit Diagnosis: Unsteadiness on feet (  R26.81);History of falling (Z91.81)   Activity Tolerance Patient tolerated treatment well   Patient Left in chair;with call bell/phone within reach;with family/visitor present   Nurse Communication Mobility status        Time: 0923-3007 OT Time Calculation (min): 30 min  Charges: OT General Charges $OT Visit: 1 Procedure OT Treatments $Self Care/Home Management : 23-37 mins   Simonne Come, [phone] 07/24/2016, 8:53 AM

## 2016-07-24 NOTE — Progress Notes (Signed)
ANTICOAGULATION CONSULT NOTE - FOLLOW UP  Pharmacy Consult:  Bivalirudin, warfarin Indication:  History of mechanical MVR and Afib  Allergies  Allergen Reactions  . Heparin Anaphylaxis  . Codeine Other (See Comments)    Unknown     Assessment: 89 YOF on Coumadin 3mg  daily exc for 1.5mg  on Mon PTA for Afib (CHADsVASc = 8) + hx mechanical MVR 2001. Admit with large left hip hematoma > Angiomax (anaphylaxis to heparin), Coumadin resumed 3/26.  High bleeding risk with history of falls.  APTT stable at 65. INR back up slightly to 2.28.H/H stable and no bleeding noted.  Goal of Therapy:  INR 2.5-3.5 PTT 50-85 sec Monitor platelets by anticoagulation protocol: Yes   Plan:  Continue bivalirudin at 0.1 mg/kg/hr (= 12 ml/hr) Repeat Coumadin 7.5mg  PO x 1 today Monitor daily INR / aPTT, CBC, s/s of bleed  Salome Arnt, PharmD, BCPS Pager # [phone] 07/24/2016 8:05 AM

## 2016-07-25 LAB — PROTIME-INR
INR: 2.48
Prothrombin Time: 27.3 seconds — ABNORMAL HIGH (ref 11.4–15.2)

## 2016-07-25 LAB — APTT: aPTT: 63 seconds — ABNORMAL HIGH (ref 24–36)

## 2016-07-25 MED ORDER — WARFARIN SODIUM 5 MG PO TABS: 5.0000 mg | ORAL_TABLET | Freq: Every day | ORAL | 0 refills | Status: DC

## 2016-07-25 MED ORDER — WARFARIN SODIUM 7.5 MG PO TABS
7.5000 mg | ORAL_TABLET | Freq: Once | ORAL | Status: AC
  Administered 2016-07-25: 7.5 mg via ORAL
  Filled 2016-07-25: qty 1

## 2016-07-25 MED ORDER — WARFARIN SODIUM 5 MG PO TABS
5.0000 mg | ORAL_TABLET | Freq: Once | ORAL | Status: DC
  Filled 2016-07-25: qty 1

## 2016-07-25 MED ORDER — ACETAMINOPHEN 325 MG PO TABS: 650.0000 mg | ORAL_TABLET | Freq: Four times a day (QID) | ORAL | Status: AC | PRN

## 2016-07-25 MED ORDER — ATENOLOL 25 MG PO TABS: 25.0000 mg | ORAL_TABLET | Freq: Every day | ORAL | 0 refills | Status: DC

## 2016-07-25 NOTE — Discharge Summary (Signed)
Physician Discharge Summary  Amanda Richardson YQM:[id number] DOB: May 05, 1923 DOA: 07/13/2016  PCP: Merrilee Seashore, MD  Admit date: 07/13/2016 Discharge date: 07/25/2016  Recommendations for Outpatient Follow-up:  1. Pt will need to follow up with PCP in 1 weeks post discharge 2. Please obtain BMP to evaluate electrolytes and kidney function 3. Please also check CBC to evaluate Hg and Hct levels 4. Please check INR 07/26/2016 and readjust the dose of Coumadin as clinically indicated 5. Goal INR is 2.5 to 2.5 range  6. Please note that we have discontinued digoxin due to pauses noted on tele  7. Please also note that pt was on laxatives but developed diarrhea, laxatives can be resumed if clinically indicated   Discharge Diagnoses:  Principal Problem:   Hematoma of left hip Active Problems:   H/O mechanical mitral valve replacement 2001   Hypertension   H/O coronary artery bypass surgery   Chronic atrial fibrillation (HCC)   Hypothyroidism   Bullous pemphigoid   Chronic anticoagulation   Chronic diastolic CHF (congestive heart failure) (HCC)   Anemia   Thrombocytopenia (HCC)   Chronic venous insufficiency   Fall   Permanent atrial fibrillation [facility])  Discharge Condition: Stable  Diet recommendation: Heart healthy diet discussed in details   Brief summary   83 y.o.femalewith medical history significant for atrial fibrillation on coumadin, bullous pemphigoid, hypertension, hypothyroidism who presented to [facility] status post mechanical fall the day of the admission.The patient had unwitnessed fall while she was walking and she fell on her left side. There was no loss of consciousness. The patient did not have any significant pain, but was too weak to get up. She laid on the floor for approximately 2-3 hours prior to her daughter coming back to check up on her. EMS was activated secondary to the patient's weakness and inability to get up.   Assessment & Plan   Principal  problem Mechanical fall complicated with left thigh/hip hematoma: -  No acute fractures seen on hip x ray. Patient had a fall in August 2017 requiring left hip hemiarthroplasty. Now fall this admission however no fracture that had large hematoma. - Coumadin was placed on hold, patient has a heparin allergy, hence was placed on IV bivalirudin. Unfortunately disposition is challenging in this patient given large hematoma and mechanical mitral valve necessitating anticoagulation. - Per hematology,Dr Ennever, Lovenox or Arixtra are not options for her.  - Per cardiology, patient will need to be back on anti-coagulation given mechanical mitral valve, since she has been stable on therapeutic IV bivalirudin - Patient continued IV bivalirudin bridge with Coumadin until INR in therapeutic range 2.5-3.5  - INR is 2.48 this AM, continue with Coumadin 5 mg daily and readjust the dose as clinically indicated   Active problems History of mechanical mitral valve replacement 2001 - Hematoma stable and resolving, H&H stable - Startedpatient on IV bivalirudin (heparin allergy), Coumadin restarted - INR therapeutic this AM - stop Bivalirudin and continue with Coumadin 5 mg daily with close monitoring of INR  Diarrhea - stopped laxatives - can resume in near future if needed   Nasal bleeding - pt says it was mild - now resolved   Chronic atrial fibrillation - CHADS vasc score at 8 (age, gender, hypertension, CHF) - restarted Coumadin, INR 2.48, continue 5 mg PO QD and readjust the dose of Coumadin as indicated   Chronic diastolic CHF - Compensated - 2 D ECHO in 2016 with normal EF - cardiology team has signed off as no  further recommendations   Hypertension,essential  - Continue atenolol and lisinopril - reasonable inpatient control   Hypothyroidism - Continue synthroid   Chronic lower extremity edema - Venous duplex left lower extremity due to worsening left lower extremity edema  negative for DVT  Bullous pemphigoid - Continue Temovate, niacinamide   Thrombocytopenia - monitor in the setting of anticoagulation  - resolved at this point   Code Status: Full CODE STATUS DVT Prophylaxis:  On Coumadin  Family Communication: Daughter updated over the phone about plan to discharge on 4/3  Procedures:   None  Consultants:    Hematology  Cardiology  Antimicrobials:   None  Procedures/Studies: Ct Head Wo Contrast  Result Date: 07/13/2016 CLINICAL DATA:  Dizziness prior to a fall today at home. EXAM: CT HEAD WITHOUT CONTRAST TECHNIQUE: Contiguous axial images were obtained from the base of the skull through the vertex without intravenous contrast. COMPARISON:  08/08/2014. 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 Mildly progressive diffuse enlargement of the ventricles and subarachnoid spaces. Patchy white matter low density in both cerebral hemispheres. Old right caudate head lacunar infarct. No intracranial hemorrhage, mass lesion or CT evidence of acute infarction. Vascular: No hyperdense vessel or unexpected calcification. Skull: Normal. Negative for fracture or focal lesion. Sinuses/Orbits: No acute finding. Other: None. IMPRESSION: 1. No acute abnormality. 2. 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 3. Stable old right caudate head lacunar infarct. 4. Mildly progressive atrophy and chronic small vessel white matter ischemic changes in both cerebral hemispheres. Electronically Signed   By: Claudie Revering M.D.   On: 07/13/2016 17:09   Dg Hip Unilat With Pelvis 2-3 Views Left  Result Date: 07/13/2016 CLINICAL DATA:  Left hip pain following a fall today. EXAM: DG HIP (WITH OR WITHOUT PELVIS) 2-3V LEFT COMPARISON:  12/17/2015. FINDINGS: Interval left femoral head prosthesis. No fracture or dislocation seen. Diffuse osteopenia. IMPRESSION: No fracture or  dislocation. Electronically Signed   By: Claudie Revering M.D.   On: 07/13/2016 17:05     Discharge Exam: Vitals:   07/24/16 2055 07/25/16 0426  BP: (!) 133/59 (!) 139/59  Pulse: 65 61  Resp: 17 17  Temp: 98.2 F (36.8 C) 98.1 F (36.7 C)   Vitals:   07/24/16 0707 07/24/16 1522 07/24/16 2055 07/25/16 0426  BP: (!) 126/54 128/72 (!) 133/59 (!) 139/59  Pulse: 72 60 65 61  Resp: 16 17 17 17   Temp: 98.2 F (36.8 C) 98.2 F (36.8 C) 98.2 F (36.8 C) 98.1 F (36.7 C)  TempSrc: Oral Oral Oral Oral  SpO2: 99% 99% 96% 98%  Weight:      Height:        General: Pt is alert, follows commands appropriately, not in acute distress Cardiovascular: Regular rate and rhythm, no rubs, no gallops Respiratory: Clear to auscultation bilaterally, no wheezing, no crackles, no rhonchi Abdominal: Soft, non tender, non distended, bowel sounds +, no guarding   Discharge Instructions  Discharge Instructions    Diet - low sodium heart healthy    Complete by:  As directed    Increase activity slowly    Complete by:  As directed      Allergies as of 07/25/2016      Reactions   Heparin Anaphylaxis   Codeine Other (See Comments)   Unknown      Medication List    STOP taking these medications   digoxin 0.125 MG tablet Commonly known as:  LANOXIN   senna-docusate 8.6-50 MG tablet Commonly known as:  Senokot-S     TAKE these medications   acetaminophen 325 MG tablet Commonly known as:  TYLENOL Take 2 tablets (650 mg total) by mouth every 6 (six) hours as needed for mild pain (or Fever >/= 101).   atenolol 25 MG tablet Commonly known as:  TENORMIN Take 1 tablet (25 mg total) by mouth daily. What changed:  medication strength  how much to take   calcium-vitamin D 500-200 MG-UNIT tablet Commonly known as:  OSCAL WITH D Take 1 tablet by mouth every evening.   Cholecalciferol 2000 units Caps Take 1 capsule by mouth every evening.   clobetasol ointment 0.05 % Commonly known as:   TEMOVATE Apply 1 application topically 2 (two) times daily as needed (blisters for bulous pemphigoid).   furosemide 40 MG tablet Commonly known as:  LASIX Take 1 tablet (40 mg total) by mouth daily.   KLOR-CON M20 20 MEQ tablet Generic drug:  potassium chloride SA Take 1 tablet by mouth daily.   latanoprost 0.005 % ophthalmic solution Commonly known as:  XALATAN Place 1 drop into both eyes at bedtime.   levothyroxine 50 MCG tablet Commonly known as:  SYNTHROID, LEVOTHROID Take 50 mcg by mouth daily.   lisinopril 10 MG tablet Commonly known as:  PRINIVIL,ZESTRIL Take 10 mg by mouth daily.   multivitamin with minerals Tabs tablet Take 1 tablet by mouth every evening.   niacinamide 500 MG tablet Take 500 mg by mouth 2 (two) times daily with a meal.   PROBIOTIC PO Take 1 capsule by mouth every other day.   warfarin 5 MG tablet Commonly known as:  COUMADIN Take 1 tablet (5 mg total) by mouth daily at 6 PM. Please check INR on 4/4 and readjust the dose of Coumadin to maintain INR 2.5 - 3.5 What changed:  medication strength  how much to take  when to take this  additional instructions            Durable Medical Equipment        Start     Ordered   07/17/16 1031  For home use only DME lightweight manual wheelchair with seat cushion  Once    Comments:  Patient suffers from generalized weakness which impairs their ability to perform daily activities like ambuating in the home.  A cane  will not resolve  issue with performing activities of daily living. A wheelchair will allow patient to safely perform daily activities. Patient is not able to propel themselves in the home using a standard weight wheelchair due to weakness. Patient can self propel in the lightweight wheelchair.  Accessories: elevating leg rests (ELRs), wheel locks, extensions and anti-tippers.   07/17/16 1032   07/17/16 1029  For home use only DME Walker rolling  Once    Comments:  Patient needs  youth walker   5'2"  Weighs 120lbs  Question:  Patient needs a walker to treat with the following condition  Answer:  Generalized weakness   07/17/16 1029       Follow-up Information    [facility] Follow up.   Why:  A representative from [facility] will contact you to arrange start date and time for your therapy. Contact information: [place] [address] [phone]        Merrilee Seashore, MD Follow up.   Specialty:  Internal Medicine Contact information: [address] [place] [address] [phone]  The results of significant diagnostics from this hospitalization (including imaging, microbiology, ancillary and laboratory) are listed below for reference.     Microbiology: No results found for this or any previous visit (from the past 240 hour(s)).   Labs: Basic Metabolic Panel:  Recent Labs Lab 07/20/16 0754 07/21/16 0436 07/22/16 0304 07/24/16 0511  NA 140 138 138 139  K 3.8 3.6 3.7 3.9  CL 105 104 104 106  CO2 28 27 27 26   GLUCOSE 92 90 87 80  BUN 20 20 22* 20  CREATININE 0.86 0.91 0.86 0.76  CALCIUM 8.7* 8.4* 8.5* 8.2*   CBC:  Recent Labs Lab 07/20/16 0754 07/21/16 0436 07/22/16 0304 07/23/16 0500 07/24/16 0511  WBC 6.4 5.8 5.8 5.3 4.7  HGB 11.2* 10.8* 10.0* 9.9* 10.2*  HCT 34.2* 32.3* 30.5* 29.8* 30.6*  MCV 95.5 95.0 95.3 95.5 94.7  PLT 167 181 173 168 176   SIGNED: Time coordinating discharge: 30 minutes  MAGICK-Jissel Slavens, MD  [facility] 07/25/2016, 10:07 AM Pager [phone]  If 7PM-7AM, please contact night-coverage [email or web address] Password [email or web address]

## 2016-07-25 NOTE — Care Management Important Message (Signed)
Important Message  Patient Details  Name: Amanda Richardson MRN: [id number] Date of Birth: 08/16/1923   Medicare Important Message Given:  Yes    Orbie Pyo 07/25/2016, 2:28 PM

## 2016-07-25 NOTE — Progress Notes (Signed)
ANTICOAGULATION CONSULT NOTE - FOLLOW UP  Pharmacy Consult:  Bivalirudin, warfarin Indication:  History of mechanical MVR and Afib  Allergies  Allergen Reactions  . Heparin Anaphylaxis  . Codeine Other (See Comments)    Unknown     Assessment: 89 YOF on Coumadin PTA for history of Afib (CHADsVASc = 8) and mechanical MVR 2001.  Patient was admitted with large left hip hematoma and Coumadin was transitioned to Angiomax (anaphylaxis to heparin).  Coumadin was resumed on 07/17/16.  She has a high bleeding risk due to history of falls.    APTT stable at 63. INR is increasing toward goal.  Home regimen:  Coumadin 3mg  daily except for 1.5mg  on Mon   Goal of Therapy:  INR 2.5-3.5 PTT 50-85 sec Monitor platelets by anticoagulation protocol: Yes    Plan:  Continue bivalirudin at 0.1 mg/kg/hr (= 12 ml/hr) Repeat Coumadin 7.5mg  PO x 1 today Monitor daily INR / aPTT, CBC, s/s of bleed   Charlcie Prisco D. Mina Marble, PharmD, BCPS Pager:  [phone] 07/25/2016, 5:53 PM

## 2016-07-25 NOTE — Progress Notes (Signed)
Per CM- pt will not d/c to SNF today d/t insurance. Pt and daughters aware.

## 2016-07-26 LAB — PROTIME-INR
INR: 3.27
Prothrombin Time: 34.1 seconds — ABNORMAL HIGH (ref 11.4–15.2)

## 2016-07-26 LAB — APTT: aPTT: 74 seconds — ABNORMAL HIGH (ref 24–36)

## 2016-07-26 MED ORDER — WARFARIN SODIUM 5 MG PO TABS: 5.0000 mg | ORAL_TABLET | Freq: Once | ORAL | Status: DC

## 2016-07-26 NOTE — Progress Notes (Signed)
ANTICOAGULATION CONSULT NOTE - FOLLOW UP  Pharmacy Consult:  warfarin Indication:  History of mechanical MVR and Afib  Allergies  Allergen Reactions  . Heparin Anaphylaxis  . Codeine Other (See Comments)    Unknown     Assessment: 89 YOF on Coumadin 3mg  daily exc for 1.5mg  on Mon PTA for Afib (CHADsVASc = 8) + hx mechanical MVR 2001. Admit with large left hip hematoma but coumadin resumed 3/26. INR is therapeutic at 3.27 after good size increase from yesterday. No new CBC today. No bleeding noted.   Goal of Therapy:  INR 2.5-3.5 Monitor platelets by anticoagulation protocol: Yes   Plan:  Coumadin 5mg  PO x 1 tonight if pt is not discharged tonight Daily INR  Salome Arnt, PharmD, BCPS Pager # [phone] 07/26/2016 12:23 PM

## 2016-07-26 NOTE — Progress Notes (Signed)
qPhysical Therapy Treatment Patient Details Name: Amanda Richardson MRN: [id number] DOB: [birth year] Today's Date: 07/26/2016    History of Present Illness Pt is a 83 yo female admitted through ED following a fall on 07/13/16 resulting in a left hip hematoma with no fracture noted. Pt had a fall in 8/17 requiring a hemiarthroplasty but has no reported falls since. PMH significant for HTN, A-fib, Hypothyroidism, and chronic LE edema.    PT Comments    Pt making gradual progress with mobility, ambulating 100 ft with rw and min guard assist. Cues needed for safety during session. Based upon the patient's current mobility, recommending SNF for further rehabilitation following acute stay.    Follow Up Recommendations  SNF;Supervision/Assistance - 24 hour     Equipment Recommendations  None recommended by PT    Recommendations for Other Services       Precautions / Restrictions Precautions Precautions: Fall Restrictions Weight Bearing Restrictions: No    Mobility  Bed Mobility               General bed mobility comments: sitting in chair upon arrival  Transfers Overall transfer level: Needs assistance Equipment used: Rolling walker (2 wheeled) Transfers: Sit to/from Stand Sit to Stand: Min assist         General transfer comment: good hand placement  Ambulation/Gait Ambulation/Gait assistance: Min guard Ambulation Distance (Feet): 100 Feet Assistive device: Rolling walker (2 wheeled) Gait Pattern/deviations: Step-through pattern;Trunk flexed;Narrow base of support;Decreased stride length     General Gait Details: cues to avoid running into objects on Rt side. Slow but steady pattern.    Stairs            Wheelchair Mobility    Modified Rankin (Stroke Patients Only)       Balance Overall balance assessment: Needs assistance Sitting-balance support: No upper extremity supported;Feet supported Sitting balance-Leahy Scale: Good     Standing balance  support: Bilateral upper extremity supported;During functional activity;Single extremity supported Standing balance-Leahy Scale: Poor Standing balance comment: using rw for support                            Cognition Arousal/Alertness: Awake/alert Behavior During Therapy: WFL for tasks assessed/performed Overall Cognitive Status: History of cognitive impairments - at baseline                                        Exercises      General Comments        Pertinent Vitals/Pain Pain Assessment: No/denies pain    Home Living                      Prior Function            PT Goals (current goals can now be found in the care plan section) Acute Rehab PT Goals Patient Stated Goal: get back home PT Goal Formulation: With patient Time For Goal Achievement: 08/09/16 Potential to Achieve Goals: Good Progress towards PT goals: Progressing toward goals    Frequency    Min 3X/week      PT Plan Current plan remains appropriate;Frequency needs to be updated    Co-evaluation             End of Session Equipment Utilized During Treatment: Gait belt Activity Tolerance: Patient tolerated treatment well Patient left: in chair;with call  bell/phone within reach Nurse Communication: Mobility status PT Visit Diagnosis: Difficulty in walking, not elsewhere classified (R26.2)     Time: 1346-1400 PT Time Calculation (min) (ACUTE ONLY): 14 min  Charges:  $Gait Training: 8-22 mins                    G Codes:       Cassell Clement, PT, CSCS Pager [phone] Office [place] 07/26/2016, 2:06 PM

## 2016-07-26 NOTE — Progress Notes (Signed)
Report called to Anne-RN at [facility]. All questions/concerns addressed. IV removed and belongings gathered. PTAR to transport. Will continue to monitor

## 2016-07-26 NOTE — Clinical Social Work Note (Signed)
Clinical Social Worker facilitated patient discharge including contacting patient family and facility to confirm patient discharge plans.  Clinical information faxed to facility and family agreeable with plan.  CSW arranged ambulance transport via [facility] to [facility].  RN to call [phone] for report prior to discharge.  Clinical Social Worker will sign off for now as social work intervention is no longer needed. Please consult [place] again if new need arises.  [address], [place]

## 2016-07-26 NOTE — Clinical Social Work Note (Signed)
CSW got a call from [facility] and insurance authorization has been provided. Facility notified. Pt's sister notified.   [place], [place]

## 2016-07-26 NOTE — Discharge Summary (Signed)
Physician Discharge Summary  Amanda Richardson YIF:[id number] DOB: [birth year] DOA: 07/13/2016  PCP: Merrilee Seashore, MD  Admit date: 07/13/2016 Discharge date: 07/26/2016  Recommendations for Outpatient Follow-up:  1. Pt will need to follow up with PCP in 1 weeks post discharge 2. Please obtain BMP to evaluate electrolytes and kidney function 3. Please also check CBC to evaluate Hg and Hct levels 4. Please check INR 07/27/2016 and readjust the dose of Coumadin as clinically indicated 5. Goal INR is 2.5 to 2.5 range  6. Please note that we have discontinued digoxin due to pauses noted on tele  7. Please also note that pt was on laxatives but developed diarrhea, laxatives can be resumed if clinically indicated  8. Weight on discharge 54.7 kg  Discharge Diagnoses:  Principal Problem:   Hematoma of left hip Active Problems:   H/O mechanical mitral valve replacement 2001   Hypertension  Discharge Condition: Stable  Diet recommendation: Heart healthy diet discussed in details   Brief summary   83 y.o.femalewith medical history significant for atrial fibrillation on coumadin, bullous pemphigoid, hypertension, hypothyroidism who presented to [facility] status post mechanical fall the day of the admission.The patient had unwitnessed fall while she was walking and she fell on her left side. There was no loss of consciousness. The patient did not have any significant pain, but was too weak to get up. She laid on the floor for approximately 2-3 hours prior to her daughter coming back to check up on her. EMS was activated secondary to the patient's weakness and inability to get up.   Assessment & Plan   Principal problem Mechanical fall complicated with left thigh/hip hematoma: -  No acute fractures seen on hip x ray. Patient had a fall in August 2017 requiring left hip hemiarthroplasty. Now fall this admission however no fracture that had large hematoma. - Coumadin was placed on hold, patient  has a heparin allergy, hence was placed on IV bivalirudin. Unfortunately disposition was challenging in this patient given large hematoma and mechanical mitral valve necessitating anticoagulation. - Per hematology,Dr Ennever, Lovenox or Arixtra are not options for her.  - Per cardiology, patient will need to be back on anti-coagulation given mechanical mitral valve, since she has been stable on therapeutic IV bivalirudin - Patient continued IV bivalirudin bridge with Coumadin until INR in therapeutic range 2.5-3.5  - INR is 3.27 this AM, continue with Coumadin 5 mg daily and readjust the dose as clinically indicated   Active problems History of mechanical mitral valve replacement 2001 - Hematoma stable and resolving, H&H stable - Startedpatient on IV bivalirudin (heparin allergy), Coumadin restarted - INR therapeutic this AM - stopped Bivalirudin 4/3 and continue with Coumadin 5 mg daily with close monitoring of INR - plan to check INR 4/5 and readjust the dose of Coumadin as indicated   Diarrhea - stopped laxatives - can resume in near future if needed  - so far pt reports stable BM  Nasal bleeding - pt says it was mild - now resolved   Chronic atrial fibrillation - CHADS vasc score at 35 (age, gender, hypertension, CHF) - has been on Coumadin, INR 3.27, continue 5 mg PO QD and readjust the dose of Coumadin as indicated   Chronic diastolic CHF - Compensated - 2 D ECHO in 2016 with normal EF - cardiology team has signed off as no further recommendations  - continue lasix per previous home regimen   Hypertension,essential  - Continue atenolol and lisinopril - reasonable inpatient control  Hypothyroidism - Continue synthroid   Chronic lower extremity edema - Venous duplex left lower extremity due to worsening left lower extremity edema negative for DVT  Bullous pemphigoid - Continue Temovate, niacinamide   Thrombocytopenia - monitor in the setting of  anticoagulation  - resolved at this point   Code Status: Full CODE STATUS DVT Prophylaxis:  On Coumadin  Family Communication: Daughter updated over the phone about plan to discharge on 4/3  Procedures:   None  Consultants:    Hematology  Cardiology  Antimicrobials:   None  Procedures/Studies: Ct Head Wo Contrast  Result Date: 07/13/2016 CLINICAL DATA:  Dizziness prior to a fall today at home. EXAM: CT HEAD WITHOUT CONTRAST TECHNIQUE: Contiguous axial images were obtained from the base of the skull through the vertex without intravenous contrast. COMPARISON:  08/08/2014. 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 Mildly progressive diffuse enlargement of the ventricles and subarachnoid spaces. Patchy white matter low density in both cerebral hemispheres. Old right caudate head lacunar infarct. No intracranial hemorrhage, mass lesion or CT evidence of acute infarction. Vascular: No hyperdense vessel or unexpected calcification. Skull: Normal. Negative for fracture or focal lesion. Sinuses/Orbits: No acute finding. Other: None. IMPRESSION: 1. No acute abnormality. 2. 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 3. Stable old right caudate head lacunar infarct. 4. Mildly progressive atrophy and chronic small vessel white matter ischemic changes in both cerebral hemispheres. Electronically Signed   By: Claudie Revering M.D.   On: 07/13/2016 17:09   Dg Hip Unilat With Pelvis 2-3 Views Left  Result Date: 07/13/2016 CLINICAL DATA:  Left hip pain following a fall today. EXAM: DG HIP (WITH OR WITHOUT PELVIS) 2-3V LEFT COMPARISON:  12/17/2015. FINDINGS: Interval left femoral head prosthesis. No fracture or dislocation seen. Diffuse osteopenia. IMPRESSION: No fracture or dislocation. Electronically Signed   By: Claudie Revering M.D.   On: 07/13/2016 17:05    Discharge Exam: Vitals:   07/25/16 2142  07/26/16 0531  BP: (!) 135/52 (!) 139/59  Pulse: 74 62  Resp: 16 16  Temp: 98 F (36.7 C) 97.6 F (36.4 C)   Vitals:   07/25/16 0426 07/25/16 1821 07/25/16 2142 07/26/16 0531  BP: (!) 139/59 110/81 (!) 135/52 (!) 139/59  Pulse: 61 65 74 62  Resp: 17 17 16 16   Temp: 98.1 F (36.7 C) 98.8 F (37.1 C) 98 F (36.7 C) 97.6 F (36.4 C)  TempSrc: Oral Oral Oral Oral  SpO2: 98% 98% 97% 95%  Weight:      Height:        General: Pt is alert, follows commands appropriately, not in acute distress Cardiovascular: Irregular rate and rhythm, no rubs, no gallops Respiratory: Clear to auscultation bilaterally, no wheezing, no crackles, no rhonchi Abdominal: Soft, non tender, non distended, bowel sounds +, no guarding   Discharge Instructions  Discharge Instructions    Diet - low sodium heart healthy    Complete by:  As directed    Increase activity slowly    Complete by:  As directed      Allergies as of 07/26/2016      Reactions   Heparin Anaphylaxis   Codeine Other (See Comments)   Unknown      Medication List    STOP taking these medications   digoxin 0.125 MG tablet Commonly known as:  LANOXIN   senna-docusate 8.6-50 MG tablet Commonly known as:  Senokot-S     TAKE  these medications   acetaminophen 325 MG tablet Commonly known as:  TYLENOL Take 2 tablets (650 mg total) by mouth every 6 (six) hours as needed for mild pain (or Fever >/= 101).   atenolol 25 MG tablet Commonly known as:  TENORMIN Take 1 tablet (25 mg total) by mouth daily. What changed:  medication strength  how much to take   calcium-vitamin D 500-200 MG-UNIT tablet Commonly known as:  OSCAL WITH D Take 1 tablet by mouth every evening.   Cholecalciferol 2000 units Caps Take 1 capsule by mouth every evening.   clobetasol ointment 0.05 % Commonly known as:  TEMOVATE Apply 1 application topically 2 (two) times daily as needed (blisters for bulous pemphigoid).   furosemide 40 MG  tablet Commonly known as:  LASIX Take 1 tablet (40 mg total) by mouth daily.   KLOR-CON M20 20 MEQ tablet Generic drug:  potassium chloride SA Take 1 tablet by mouth daily.   latanoprost 0.005 % ophthalmic solution Commonly known as:  XALATAN Place 1 drop into both eyes at bedtime.   levothyroxine 50 MCG tablet Commonly known as:  SYNTHROID, LEVOTHROID Take 50 mcg by mouth daily.   lisinopril 10 MG tablet Commonly known as:  PRINIVIL,ZESTRIL Take 10 mg by mouth daily.   multivitamin with minerals Tabs tablet Take 1 tablet by mouth every evening.   niacinamide 500 MG tablet Take 500 mg by mouth 2 (two) times daily with a meal.   PROBIOTIC PO Take 1 capsule by mouth every other day.   warfarin 5 MG tablet Commonly known as:  COUMADIN Take 1 tablet (5 mg total) by mouth daily at 6 PM. Please check INR on 4/4 and readjust the dose of Coumadin to maintain INR 2.5 - 3.5 What changed:  medication strength  how much to take  when to take this  additional instructions            Durable Medical Equipment        Start     Ordered   07/17/16 1031  For home use only DME lightweight manual wheelchair with seat cushion  Once    Comments:  Patient suffers from generalized weakness which impairs their ability to perform daily activities like ambuating in the home.  A cane  will not resolve  issue with performing activities of daily living. A wheelchair will allow patient to safely perform daily activities. Patient is not able to propel themselves in the home using a standard weight wheelchair due to weakness. Patient can self propel in the lightweight wheelchair.  Accessories: elevating leg rests (ELRs), wheel locks, extensions and anti-tippers.   07/17/16 1032   07/17/16 1029  For home use only DME Walker rolling  Once    Comments:  Patient needs youth walker   5'2"  Weighs 120lbs  Question:  Patient needs a walker to treat with the following condition  Answer:   Generalized weakness   07/17/16 1029        Contact information for follow-up providers    [facility] Follow up.   Why:  A representative from [facility] will contact you to arrange start date and time for your therapy. Contact information: [place] [address] [phone]        Merrilee Seashore, MD Follow up.   Specialty:  Internal Medicine Contact information: [address] [place] [address] [phone]            Contact information  for after-discharge care    Destination    [facility] .   Specialty:  [place] information: [address] [place] [phone]                  The results of significant diagnostics from this hospitalization (including imaging, microbiology, ancillary and laboratory) are listed below for reference.     Microbiology: No results found for this or any previous visit (from the past 240 hour(s)).   Labs: Basic Metabolic Panel:  Recent Labs Lab 07/20/16 0754 07/21/16 0436 07/22/16 0304 07/24/16 0511  NA 140 138 138 139  K 3.8 3.6 3.7 3.9  CL 105 104 104 106  CO2 28 27 27 26   GLUCOSE 92 90 87 80  BUN 20 20 22* 20  CREATININE 0.86 0.91 0.86 0.76  CALCIUM 8.7* 8.4* 8.5* 8.2*   CBC:  Recent Labs Lab 07/20/16 0754 07/21/16 0436 07/22/16 0304 07/23/16 0500 07/24/16 0511  WBC 6.4 5.8 5.8 5.3 4.7  HGB 11.2* 10.8* 10.0* 9.9* 10.2*  HCT 34.2* 32.3* 30.5* 29.8* 30.6*  MCV 95.5 95.0 95.3 95.5 94.7  PLT 167 181 173 168 176   SIGNED: Time coordinating discharge: 30 minutes  MAGICK-Salote Weidmann, MD  [facility] 07/26/2016, 8:48 AM Pager [phone]  If 7PM-7AM, please contact night-coverage [email or web address] Password [email or web address]

## 2016-09-20 ENCOUNTER — Encounter (HOSPITAL_COMMUNITY): Payer: Self-pay

## 2016-09-20 ENCOUNTER — Emergency Department (HOSPITAL_COMMUNITY): Payer: Medicare Other

## 2016-09-20 ENCOUNTER — Inpatient Hospital Stay (HOSPITAL_COMMUNITY)
Admission: EM | Admit: 2016-09-20 | Discharge: 2016-09-22 | DRG: 871 | Disposition: A | Discharge status: Home Health | Payer: Medicare Other | Attending: Internal Medicine | Admitting: Internal Medicine

## 2016-09-20 DIAGNOSIS — Z7901 Long term (current) use of anticoagulants: Secondary | ICD-10-CM

## 2016-09-20 DIAGNOSIS — L03115 Cellulitis of right lower limb: Secondary | ICD-10-CM | POA: Diagnosis present

## 2016-09-20 DIAGNOSIS — I5032 Chronic diastolic (congestive) heart failure: Secondary | ICD-10-CM | POA: Diagnosis present

## 2016-09-20 DIAGNOSIS — R509 Fever, unspecified: Secondary | ICD-10-CM

## 2016-09-20 DIAGNOSIS — R410 Disorientation, unspecified: Secondary | ICD-10-CM

## 2016-09-20 DIAGNOSIS — L8990 Pressure ulcer of unspecified site, unspecified stage: Secondary | ICD-10-CM | POA: Insufficient documentation

## 2016-09-20 DIAGNOSIS — Z8673 Personal history of transient ischemic attack (TIA), and cerebral infarction without residual deficits: Secondary | ICD-10-CM

## 2016-09-20 DIAGNOSIS — L89152 Pressure ulcer of sacral region, stage 2: Secondary | ICD-10-CM | POA: Diagnosis present

## 2016-09-20 DIAGNOSIS — L0390 Cellulitis, unspecified: Secondary | ICD-10-CM | POA: Diagnosis present

## 2016-09-20 DIAGNOSIS — L120 Bullous pemphigoid: Secondary | ICD-10-CM | POA: Diagnosis present

## 2016-09-20 DIAGNOSIS — I10 Essential (primary) hypertension: Secondary | ICD-10-CM | POA: Diagnosis present

## 2016-09-20 DIAGNOSIS — I482 Chronic atrial fibrillation, unspecified: Secondary | ICD-10-CM | POA: Diagnosis present

## 2016-09-20 DIAGNOSIS — H409 Unspecified glaucoma: Secondary | ICD-10-CM | POA: Diagnosis present

## 2016-09-20 DIAGNOSIS — A419 Sepsis, unspecified organism: Principal | ICD-10-CM | POA: Diagnosis present

## 2016-09-20 DIAGNOSIS — Z66 Do not resuscitate: Secondary | ICD-10-CM | POA: Diagnosis present

## 2016-09-20 DIAGNOSIS — Z79899 Other long term (current) drug therapy: Secondary | ICD-10-CM

## 2016-09-20 DIAGNOSIS — R000 Tachycardia, unspecified: Secondary | ICD-10-CM | POA: Diagnosis present

## 2016-09-20 DIAGNOSIS — G9341 Metabolic encephalopathy: Secondary | ICD-10-CM | POA: Diagnosis present

## 2016-09-20 DIAGNOSIS — Z888 Allergy status to other drugs, medicaments and biological substances status: Secondary | ICD-10-CM

## 2016-09-20 DIAGNOSIS — E039 Hypothyroidism, unspecified: Secondary | ICD-10-CM | POA: Diagnosis present

## 2016-09-20 DIAGNOSIS — N179 Acute kidney failure, unspecified: Secondary | ICD-10-CM | POA: Diagnosis present

## 2016-09-20 DIAGNOSIS — I2510 Atherosclerotic heart disease of native coronary artery without angina pectoris: Secondary | ICD-10-CM | POA: Diagnosis present

## 2016-09-20 DIAGNOSIS — Z96642 Presence of left artificial hip joint: Secondary | ICD-10-CM | POA: Diagnosis present

## 2016-09-20 DIAGNOSIS — Z86718 Personal history of other venous thrombosis and embolism: Secondary | ICD-10-CM

## 2016-09-20 DIAGNOSIS — K559 Vascular disorder of intestine, unspecified: Secondary | ICD-10-CM | POA: Diagnosis present

## 2016-09-20 DIAGNOSIS — Z885 Allergy status to narcotic agent status: Secondary | ICD-10-CM

## 2016-09-20 DIAGNOSIS — Z952 Presence of prosthetic heart valve: Secondary | ICD-10-CM

## 2016-09-20 DIAGNOSIS — M25559 Pain in unspecified hip: Secondary | ICD-10-CM

## 2016-09-20 DIAGNOSIS — I872 Venous insufficiency (chronic) (peripheral): Secondary | ICD-10-CM | POA: Diagnosis present

## 2016-09-20 DIAGNOSIS — L03116 Cellulitis of left lower limb: Secondary | ICD-10-CM | POA: Diagnosis present

## 2016-09-20 DIAGNOSIS — Z905 Acquired absence of kidney: Secondary | ICD-10-CM

## 2016-09-20 DIAGNOSIS — M25472 Effusion, left ankle: Secondary | ICD-10-CM

## 2016-09-20 DIAGNOSIS — Z951 Presence of aortocoronary bypass graft: Secondary | ICD-10-CM

## 2016-09-20 DIAGNOSIS — I110 Hypertensive heart disease with heart failure: Secondary | ICD-10-CM | POA: Diagnosis present

## 2016-09-20 LAB — COMPREHENSIVE METABOLIC PANEL
ALBUMIN: 3.7 g/dL (ref 3.5–5.0)
ALT: 26 U/L (ref 14–54)
AST: 50 U/L — AB (ref 15–41)
Alkaline Phosphatase: 102 U/L (ref 38–126)
Anion gap: 10 (ref 5–15)
BILIRUBIN TOTAL: 1.4 mg/dL — AB (ref 0.3–1.2)
BUN: 24 mg/dL — AB (ref 6–20)
CHLORIDE: 103 mmol/L (ref 101–111)
CO2: 24 mmol/L (ref 22–32)
CREATININE: 1.18 mg/dL — AB (ref 0.44–1.00)
Calcium: 9.1 mg/dL (ref 8.9–10.3)
GFR calc Af Amer: 45 mL/min — ABNORMAL LOW (ref >60)
GFR, EST NON AFRICAN AMERICAN: 39 mL/min — AB (ref >60)
GLUCOSE: 128 mg/dL — AB (ref 65–99)
POTASSIUM: 3.6 mmol/L (ref 3.5–5.1)
Sodium: 137 mmol/L (ref 135–145)
TOTAL PROTEIN: 7.3 g/dL (ref 6.5–8.1)

## 2016-09-20 LAB — CBC WITH DIFFERENTIAL/PLATELET
Basophils Absolute: 0 10*3/uL (ref 0.0–0.1)
Basophils Relative: 0 %
EOS ABS: 0 10*3/uL (ref 0.0–0.7)
EOS PCT: 0 %
HCT: 36.9 % (ref 36.0–46.0)
Hemoglobin: 12.5 g/dL (ref 12.0–15.0)
LYMPHS ABS: 0.6 10*3/uL — AB (ref 0.7–4.0)
LYMPHS PCT: 4 %
MCH: 31.2 pg (ref 26.0–34.0)
MCHC: 33.9 g/dL (ref 30.0–36.0)
MCV: 92 fL (ref 78.0–100.0)
MONO ABS: 1.1 10*3/uL — AB (ref 0.1–1.0)
Monocytes Relative: 8 %
Neutro Abs: 13 10*3/uL — ABNORMAL HIGH (ref 1.7–7.7)
Neutrophils Relative %: 88 %
PLATELETS: 147 10*3/uL — AB (ref 150–400)
RBC: 4.01 MIL/uL (ref 3.87–5.11)
RDW: 13.8 % (ref 11.5–15.5)
WBC: 14.7 10*3/uL — ABNORMAL HIGH (ref 4.0–10.5)

## 2016-09-20 LAB — PROTIME-INR
INR: 2.27
PROTHROMBIN TIME: 25.5 s — AB (ref 11.4–15.2)

## 2016-09-20 LAB — I-STAT CG4 LACTIC ACID, ED: Lactic Acid, Venous: 1.35 mmol/L (ref 0.5–1.9)

## 2016-09-20 MED ORDER — PIPERACILLIN-TAZOBACTAM 3.375 G IVPB 30 MIN
3.3750 g | Freq: Once | INTRAVENOUS | Status: AC
  Administered 2016-09-21: 3.375 g via INTRAVENOUS
  Filled 2016-09-20: qty 50

## 2016-09-20 MED ORDER — SODIUM CHLORIDE 0.9 % IV BOLUS (SEPSIS)
1000.0000 mL | Freq: Once | INTRAVENOUS | Status: AC
  Administered 2016-09-21: 1000 mL via INTRAVENOUS

## 2016-09-20 MED ORDER — VANCOMYCIN HCL IN DEXTROSE 1-5 GM/200ML-% IV SOLN
1000.0000 mg | Freq: Once | INTRAVENOUS | Status: AC
  Administered 2016-09-21: 1000 mg via INTRAVENOUS
  Filled 2016-09-20: qty 200

## 2016-09-20 MED ORDER — ACETAMINOPHEN 325 MG PO TABS
650.0000 mg | ORAL_TABLET | Freq: Once | ORAL | Status: AC
  Administered 2016-09-20: 650 mg via ORAL
  Filled 2016-09-20: qty 2

## 2016-09-20 MED ORDER — SODIUM CHLORIDE 0.9 % IV SOLN: INTRAVENOUS | Status: DC

## 2016-09-20 NOTE — ED Provider Notes (Addendum)
TIME SEEN: 11:30 PM   By signing my name below, I, Collene Leyden, attest that this documentation has been prepared under the direction and in the presence of Ward, Delice Bison, DO. Electronically Signed: Collene Leyden, Scribe. 09/20/16. 11:54 PM.  CHIEF COMPLAINT: Fever  LEVEL 5 CAVEAT DUE TO ALTERED MENTAL STATUS   HPI: Amanda Richardson is a 83 y.o. female with a history of basal cell carcinoma, CHF, atrial fibrillation on coumadin, CAD, HTN, bullous pemphigoid with chronic lower extremity wounds and a stroke, who presents to the Emergency Department complaining of sudden-onset fever of 103 that began earlier today. According to family the patient has been acting abnormal today with unusual conversations and confusion. Patient was noted to have had home health PT today, in which she was able to complete her assessment without difficulty. Patient does have "ulcers' of the bilateral legs that are currently being treated, patient has Una boots in place. Seen by wound care yesterday. No recent falls or head injury. Patient has had an associated cough, bilateral leg swelling, bilateral leg erythema, and new "ulcers" of the bilateral lower legs. No modifying factors indicated. Patient has not had any nausea, vomiting, or diarrhea.   ROS: Level V caveat for altered mental status  PAST MEDICAL HISTORY/PAST SURGICAL HISTORY:  Past Medical History:  Diagnosis Date  . Allergic rhinitis   . Anemia    not applicable since [id number] when pt had hysterectomy   . Basal cell carcinoma   . Bilateral lower extremity edema    history of venous insufficiency  . Bullous pemphigoid   . Chronic anticoagulation   . Chronic atrial fibrillation ([place])   . Chronic diastolic CHF (congestive heart failure) ([place])   . Coronary artery disease    a. CABG 2001 at time of MVR.  Marland Kitchen DVT (deep venous thrombosis) ([place])    possible in 1990s and does not remember being on blood thinner  . Glaucoma   . H/O mitral valve replacement  2001   mechanical heart valve; family states indication may have been MR from MVP or  rheumatic heart disease  . Hypertension   . Hypothyroidism   . 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 S/p nephrectomy    left  . Stroke [facility])    a. CT head 2018 showed old left anterior watershed infarct, old right caudate lacunar infarct.  . Thrombocytopenia ([place])   . Urinary incontinence     MEDICATIONS:  Prior to Admission medications   Medication Sig Start Date End Date Taking? Authorizing Provider  acetaminophen (TYLENOL) 325 MG tablet Take 2 tablets (650 mg total) by mouth every 6 (six) hours as needed for mild pain (or Fever >/= 101). 07/25/16  Yes Theodis Blaze, MD  atenolol (TENORMIN) 25 MG tablet Take 1 tablet (25 mg total) by mouth daily. 07/25/16  Yes Theodis Blaze, MD  calcium-vitamin D (OSCAL WITH D) 500-200 MG-UNIT per tablet Take 1 tablet by mouth every evening.    Yes [provider]  Cholecalciferol 2000 units CAPS Take 1 capsule by mouth every evening.   Yes [provider]  clobetasol ointment (TEMOVATE) 3.81 % Apply 1 application topically 2 (two) times daily as needed (blisters for bulous pemphigoid).    Yes [provider]  furosemide (LASIX) 40 MG tablet Take 1 tablet (40 mg total) by mouth daily. 08/11/14  Yes Elmahi, Rae Lips, MD  KLOR-CON M20 20 MEQ tablet Take 1 tablet by mouth daily.  08/22/14  Yes [provider]  latanoprost (XALATAN) 0.005 % ophthalmic solution Place 1 drop into both eyes at bedtime.    Yes [provider]  levothyroxine (SYNTHROID, LEVOTHROID) 50 MCG tablet Take 50 mcg by mouth daily. 05/05/14  Yes [provider]  lisinopril (PRINIVIL,ZESTRIL) 10 MG tablet Take 10 mg by mouth daily.  03/27/14  Yes [provider]  Multiple Vitamin (MULTIVITAMIN WITH MINERALS) TABS Take 1 tablet by mouth every evening.    Yes [provider]  niacinamide 500 MG tablet Take 500 mg by mouth 2  (two) times daily with a meal.   Yes [provider]  PRESCRIPTION MEDICATION Apply 1 each topically as needed (wound care).   Yes [provider]  Probiotic Product (PROBIOTIC PO) Take 1 capsule by mouth every other day.   Yes [provider]  warfarin (COUMADIN) 5 MG tablet Take 1 tablet (5 mg total) by mouth daily at 6 PM. Please check INR on 4/4 and readjust the dose of Coumadin to maintain INR 2.5 - 3.5 07/25/16  Yes Theodis Blaze, MD    ALLERGIES:  Allergies  Allergen Reactions  . Heparin Anaphylaxis  . Codeine Other (See Comments)    Unknown     SOCIAL HISTORY:  Social History  Substance Use Topics  . Smoking status: Never Smoker  . Smokeless tobacco: Never Used  . Alcohol use No    FAMILY HISTORY: Family History  Problem Relation Age of Onset  . Irregular heart beat Mother   . Other Mother        pellegra  . Heart failure Mother        had very swollen legs??  . Stroke Mother   . Prostate cancer Father        also skin cancer  . Heart attack Daughter   . Heart disease Brother   . Heart attack Brother 36       still alive    EXAM: BP 125/88 (BP Location: Right Arm)   Pulse (!) 120   Temp (!) 102.7 F (39.3 C) (Oral)   Resp 12   SpO2 92%  CONSTITUTIONAL: Alert, elderly, and confused; Febrile, but non-toxic appearing; Can answer some questions but does not do so appropriately HEAD: Normocephalic, atraumatic EYES: Conjunctivae clear, pupils appear equal, EOMI ENT: normal nose; moist mucous membranes NECK: Supple, no meningismus, no nuchal rigidity, no LAD  CARD: Irregularly irregular; S1 and S2 appreciated; murmur present, no clicks, no rubs, no gallops RESP: Normal chest excursion without splinting or tachypnea; breath sounds clear and equal bilaterally; no wheezes, no rhonchi, no rales, no hypoxia or respiratory distress, speaking full sentences ABD/GI: Normal bowel sounds; non-distended; soft, non-tender, no rebound, no guarding,  no peritoneal signs, no hepatosplenomegaly BACK:  The back appears normal and is non-tender to palpation, there is no CVA tenderness EXT: Normal ROM in all joints; non-tender to palpation; no edema; normal capillary refill; no cyanosis, no calf tenderness or swelling    SKIN: Scattered superficial ulcers to the bilateral lower extremities with areas of erythema an warmth; no induration or fluctuance.  NEURO: Moves all extremities equally, no facial droop, normal speech, CH 2-12 intact, normal sensation PSYCH: The patient's mood and manner are appropriate. Grooming and personal hygiene are appropriate.  MEDICAL DECISION MAKING: Patient here with fever, tachycardia, leukocytosis. Patient appears to have bilateral lower extremity cellulitis. She meets sepsis criteria. No hypotension and normal lactate. She will receive gentle IV hydration but at this time  I do not feel she needs 30 mL/kg IV fluid bolus. We'll give broad-spectrum antibiotics of vancomycin and Zosyn. Urinalysis is pending. Discussed with family that I recommend admission. They agree. We'll discuss with hospitalist.  She is slightly confused but has no focal neurologic deficit. No trauma. Denies any recent falls or head injury. Doubt stroke or intracranial hemorrhage. She denies headache.  ED PROGRESS:   12:12 AM Discussed patient's case with hospitalist, Dr. Blaine Hamper.  I have recommended admission and patient (and family if present) agree with this plan. Admitting physician will place admission orders.   I reviewed all nursing notes, vitals, pertinent previous records, EKGs, lab and urine results, imaging (as available).     EKG Interpretation  Date/Time:  Thursday Sep 21 2016 00:30:46 EDT Ventricular Rate:  78 PR Interval:    QRS Duration: 105 QT Interval:  409 QTC Calculation: 466 R Axis:   126 Text Interpretation:  Atrial fibrillation Nonspecific T abnormalities, anterior leads No significant change since last tracing Confirmed  by Ward, [name] [phone]) on 09/21/2016 12:35:54 AM       CRITICAL CARE Performed by: Nyra Jabs   Total critical care time: 40 minutes  Critical care time was exclusive of separately billable procedures and treating other patients.  Critical care was necessary to treat or prevent imminent or life-threatening deterioration.  Critical care was time spent personally by me on the following activities: development of treatment plan with patient and/or surrogate as well as nursing, discussions with consultants, evaluation of patient's response to treatment, examination of patient, obtaining history from patient or surrogate, ordering and performing treatments and interventions, ordering and review of laboratory studies, ordering and review of radiographic studies, pulse oximetry and re-evaluation of patient's condition.   I personally performed the services described in this documentation, which was scribed in my presence. The recorded information has been reviewed and is accurate.     Ward, Delice Bison, DO 09/21/16 0027    Ward, Delice Bison, DO 09/21/16 [id number]

## 2016-09-20 NOTE — ED Notes (Signed)
Per EMS- from home, family noticed patient was lethargic, conversation off, disoriented. Home temp of 103 reported. Pt has leg wounds that are being treated, incontinent of urine- foul smelling.

## 2016-09-20 NOTE — ED Notes (Signed)
Bed: WA17 Expected date:  Expected time:  Means of arrival:  Comments: 83 yr old, fever, AMS

## 2016-09-21 ENCOUNTER — Inpatient Hospital Stay (HOSPITAL_COMMUNITY): Payer: Medicare Other

## 2016-09-21 DIAGNOSIS — N179 Acute kidney failure, unspecified: Secondary | ICD-10-CM | POA: Diagnosis present

## 2016-09-21 DIAGNOSIS — I5032 Chronic diastolic (congestive) heart failure: Secondary | ICD-10-CM | POA: Diagnosis present

## 2016-09-21 DIAGNOSIS — Z951 Presence of aortocoronary bypass graft: Secondary | ICD-10-CM | POA: Diagnosis not present

## 2016-09-21 DIAGNOSIS — Z952 Presence of prosthetic heart valve: Secondary | ICD-10-CM

## 2016-09-21 DIAGNOSIS — L03116 Cellulitis of left lower limb: Secondary | ICD-10-CM | POA: Diagnosis present

## 2016-09-21 DIAGNOSIS — L03115 Cellulitis of right lower limb: Secondary | ICD-10-CM | POA: Diagnosis present

## 2016-09-21 DIAGNOSIS — Z905 Acquired absence of kidney: Secondary | ICD-10-CM | POA: Diagnosis not present

## 2016-09-21 DIAGNOSIS — A419 Sepsis, unspecified organism: Secondary | ICD-10-CM | POA: Diagnosis present

## 2016-09-21 DIAGNOSIS — Z86718 Personal history of other venous thrombosis and embolism: Secondary | ICD-10-CM | POA: Diagnosis not present

## 2016-09-21 DIAGNOSIS — I872 Venous insufficiency (chronic) (peripheral): Secondary | ICD-10-CM | POA: Diagnosis present

## 2016-09-21 DIAGNOSIS — Z888 Allergy status to other drugs, medicaments and biological substances status: Secondary | ICD-10-CM | POA: Diagnosis not present

## 2016-09-21 DIAGNOSIS — Z66 Do not resuscitate: Secondary | ICD-10-CM | POA: Diagnosis present

## 2016-09-21 DIAGNOSIS — E039 Hypothyroidism, unspecified: Secondary | ICD-10-CM

## 2016-09-21 DIAGNOSIS — I482 Chronic atrial fibrillation: Secondary | ICD-10-CM | POA: Diagnosis present

## 2016-09-21 DIAGNOSIS — G9341 Metabolic encephalopathy: Secondary | ICD-10-CM | POA: Diagnosis present

## 2016-09-21 DIAGNOSIS — R000 Tachycardia, unspecified: Secondary | ICD-10-CM | POA: Diagnosis present

## 2016-09-21 DIAGNOSIS — I110 Hypertensive heart disease with heart failure: Secondary | ICD-10-CM | POA: Diagnosis present

## 2016-09-21 DIAGNOSIS — Z7901 Long term (current) use of anticoagulants: Secondary | ICD-10-CM | POA: Diagnosis not present

## 2016-09-21 DIAGNOSIS — Z8673 Personal history of transient ischemic attack (TIA), and cerebral infarction without residual deficits: Secondary | ICD-10-CM | POA: Diagnosis not present

## 2016-09-21 DIAGNOSIS — L120 Bullous pemphigoid: Secondary | ICD-10-CM | POA: Diagnosis present

## 2016-09-21 DIAGNOSIS — L89152 Pressure ulcer of sacral region, stage 2: Secondary | ICD-10-CM | POA: Diagnosis present

## 2016-09-21 DIAGNOSIS — L0390 Cellulitis, unspecified: Secondary | ICD-10-CM | POA: Diagnosis present

## 2016-09-21 DIAGNOSIS — H409 Unspecified glaucoma: Secondary | ICD-10-CM | POA: Diagnosis present

## 2016-09-21 DIAGNOSIS — R410 Disorientation, unspecified: Secondary | ICD-10-CM | POA: Diagnosis present

## 2016-09-21 DIAGNOSIS — I2510 Atherosclerotic heart disease of native coronary artery without angina pectoris: Secondary | ICD-10-CM | POA: Diagnosis present

## 2016-09-21 DIAGNOSIS — Z96642 Presence of left artificial hip joint: Secondary | ICD-10-CM | POA: Diagnosis present

## 2016-09-21 DIAGNOSIS — K559 Vascular disorder of intestine, unspecified: Secondary | ICD-10-CM | POA: Diagnosis present

## 2016-09-21 LAB — PROCALCITONIN: PROCALCITONIN: 13.14 ng/mL

## 2016-09-21 LAB — CBC
HEMATOCRIT: 33.1 % — AB (ref 36.0–46.0)
Hemoglobin: 11.3 g/dL — ABNORMAL LOW (ref 12.0–15.0)
MCH: 31.5 pg (ref 26.0–34.0)
MCHC: 34.1 g/dL (ref 30.0–36.0)
MCV: 92.2 fL (ref 78.0–100.0)
Platelets: 117 10*3/uL — ABNORMAL LOW (ref 150–400)
RBC: 3.59 MIL/uL — ABNORMAL LOW (ref 3.87–5.11)
RDW: 14.1 % (ref 11.5–15.5)
WBC: 18.9 10*3/uL — ABNORMAL HIGH (ref 4.0–10.5)

## 2016-09-21 LAB — URINALYSIS, ROUTINE W REFLEX MICROSCOPIC
Bacteria, UA: NONE SEEN
Bilirubin Urine: NEGATIVE
GLUCOSE, UA: NEGATIVE mg/dL
KETONES UR: NEGATIVE mg/dL
LEUKOCYTES UA: NEGATIVE
Nitrite: NEGATIVE
PH: 5 (ref 5.0–8.0)
Protein, ur: 100 mg/dL — AB
SQUAMOUS EPITHELIAL / LPF: NONE SEEN
Specific Gravity, Urine: 1.016 (ref 1.005–1.030)

## 2016-09-21 LAB — LACTIC ACID, PLASMA
LACTIC ACID, VENOUS: 2.2 mmol/L — AB (ref 0.5–1.9)
Lactic Acid, Venous: 1.7 mmol/L (ref 0.5–1.9)

## 2016-09-21 LAB — SEDIMENTATION RATE: SED RATE: 28 mm/h — AB (ref 0–22)

## 2016-09-21 LAB — BASIC METABOLIC PANEL
ANION GAP: 8 (ref 5–15)
BUN: 23 mg/dL — ABNORMAL HIGH (ref 6–20)
CALCIUM: 8.4 mg/dL — AB (ref 8.9–10.3)
CO2: 24 mmol/L (ref 22–32)
Chloride: 105 mmol/L (ref 101–111)
Creatinine, Ser: 1.24 mg/dL — ABNORMAL HIGH (ref 0.44–1.00)
GFR calc Af Amer: 43 mL/min — ABNORMAL LOW (ref >60)
GFR, EST NON AFRICAN AMERICAN: 37 mL/min — AB (ref >60)
GLUCOSE: 141 mg/dL — AB (ref 65–99)
Potassium: 3.5 mmol/L (ref 3.5–5.1)
Sodium: 137 mmol/L (ref 135–145)

## 2016-09-21 LAB — PROTIME-INR
INR: 2.65
PROTHROMBIN TIME: 28.8 s — AB (ref 11.4–15.2)

## 2016-09-21 LAB — MRSA PCR SCREENING: MRSA BY PCR: NEGATIVE

## 2016-09-21 LAB — C-REACTIVE PROTEIN: CRP: 1.2 mg/dL — AB (ref <1.0)

## 2016-09-21 LAB — I-STAT CG4 LACTIC ACID, ED: Lactic Acid, Venous: 1.46 mmol/L (ref 0.5–1.9)

## 2016-09-21 LAB — BRAIN NATRIURETIC PEPTIDE: B Natriuretic Peptide: 1143.1 pg/mL — ABNORMAL HIGH (ref 0.0–100.0)

## 2016-09-21 MED ORDER — CALCIUM CARBONATE-VITAMIN D 500-200 MG-UNIT PO TABS
1.0000 | ORAL_TABLET | Freq: Every evening | ORAL | Status: DC
  Administered 2016-09-21: 1 via ORAL
  Filled 2016-09-21: qty 1

## 2016-09-21 MED ORDER — RISAQUAD PO CAPS
1.0000 | ORAL_CAPSULE | ORAL | Status: DC
  Administered 2016-09-21: 1 via ORAL
  Filled 2016-09-21 (×2): qty 1

## 2016-09-21 MED ORDER — ATENOLOL 25 MG PO TABS
25.0000 mg | ORAL_TABLET | Freq: Every day | ORAL | Status: DC
  Administered 2016-09-21 – 2016-09-22 (×2): 25 mg via ORAL
  Filled 2016-09-21 (×2): qty 1

## 2016-09-21 MED ORDER — ZOLPIDEM TARTRATE 5 MG PO TABS: 5.0000 mg | ORAL_TABLET | Freq: Every evening | ORAL | Status: DC | PRN

## 2016-09-21 MED ORDER — WARFARIN - PHARMACIST DOSING INPATIENT: Freq: Every day | Status: DC

## 2016-09-21 MED ORDER — NIACINAMIDE 500 MG PO TABS: 500.0000 mg | ORAL_TABLET | Freq: Two times a day (BID) | ORAL | Status: DC

## 2016-09-21 MED ORDER — LATANOPROST 0.005 % OP SOLN
1.0000 [drp] | Freq: Every day | OPHTHALMIC | Status: DC
  Administered 2016-09-21 (×2): 1 [drp] via OPHTHALMIC
  Filled 2016-09-21: qty 2.5

## 2016-09-21 MED ORDER — SODIUM CHLORIDE 0.9% FLUSH
3.0000 mL | Freq: Two times a day (BID) | INTRAVENOUS | Status: DC
  Administered 2016-09-21 – 2016-09-22 (×3): 3 mL via INTRAVENOUS

## 2016-09-21 MED ORDER — ONDANSETRON HCL 4 MG/2ML IJ SOLN: 4.0000 mg | Freq: Four times a day (QID) | INTRAMUSCULAR | Status: DC | PRN

## 2016-09-21 MED ORDER — ACETAMINOPHEN 325 MG PO TABS: 650.0000 mg | ORAL_TABLET | Freq: Four times a day (QID) | ORAL | Status: DC | PRN

## 2016-09-21 MED ORDER — LEVOTHYROXINE SODIUM 50 MCG PO TABS
50.0000 ug | ORAL_TABLET | Freq: Every day | ORAL | Status: DC
  Administered 2016-09-21 – 2016-09-22 (×2): 50 ug via ORAL
  Filled 2016-09-21 (×2): qty 1

## 2016-09-21 MED ORDER — PIPERACILLIN-TAZOBACTAM 3.375 G IVPB
3.3750 g | Freq: Three times a day (TID) | INTRAVENOUS | Status: DC
  Administered 2016-09-21 – 2016-09-22 (×4): 3.375 g via INTRAVENOUS
  Filled 2016-09-21 (×4): qty 50

## 2016-09-21 MED ORDER — CLOBETASOL PROPIONATE 0.05 % EX OINT: 1.0000 "application " | TOPICAL_OINTMENT | Freq: Two times a day (BID) | CUTANEOUS | Status: DC | PRN

## 2016-09-21 MED ORDER — VANCOMYCIN HCL 500 MG IV SOLR: 500.0000 mg | INTRAVENOUS | Status: DC

## 2016-09-21 MED ORDER — ONDANSETRON HCL 4 MG PO TABS: 4.0000 mg | ORAL_TABLET | Freq: Four times a day (QID) | ORAL | Status: DC | PRN

## 2016-09-21 MED ORDER — WARFARIN SODIUM 6 MG PO TABS
6.0000 mg | ORAL_TABLET | ORAL | Status: AC
  Administered 2016-09-21: 6 mg via ORAL
  Filled 2016-09-21: qty 1

## 2016-09-21 MED ORDER — ADULT MULTIVITAMIN W/MINERALS CH
1.0000 | ORAL_TABLET | Freq: Every evening | ORAL | Status: DC
  Administered 2016-09-21: 1 via ORAL
  Filled 2016-09-21: qty 1

## 2016-09-21 MED ORDER — HYDRALAZINE HCL 20 MG/ML IJ SOLN
5.0000 mg | INTRAMUSCULAR | Status: DC | PRN
  Filled 2016-09-21: qty 0.25

## 2016-09-21 MED ORDER — VITAMIN D 1000 UNITS PO TABS
1000.0000 [IU] | ORAL_TABLET | Freq: Every evening | ORAL | Status: DC
  Administered 2016-09-21: 1000 [IU] via ORAL
  Filled 2016-09-21: qty 1

## 2016-09-21 MED ORDER — WARFARIN SODIUM 6 MG PO TABS
6.0000 mg | ORAL_TABLET | Freq: Once | ORAL | Status: AC
  Administered 2016-09-21: 6 mg via ORAL
  Filled 2016-09-21: qty 1

## 2016-09-21 NOTE — ED Notes (Signed)
Pt. Documented in error Notify Physician if pt. Is possible Sepsis patient.

## 2016-09-21 NOTE — Care Management Note (Signed)
Case Management Note  Patient Details  Name: RUDINE RIEGER MRN: [id number] Date of Birth: Nov 20, 1923  Subjective/Objective: 83 y/o f admitted Bilateral LL cellulitis. From home. Active w/[facility]-HHRN,HHPT-rep Tim aware & following.Has rw.                   Action/Plan:d/c home w/HHC.   Expected Discharge Date:                  Expected Discharge Plan:  [place]  In-House Referral:     Discharge planning Services  CM Consult  Post Acute Care Choice:  (S) [place] (Active w/[facility]-HHRN/HHPT) Choice offered to:  Patient  DME Arranged:    DME Agency:  [facility] (formerly [employer])  [place]:  RN, PT [place] Agency:  [facility] (formerly [employer])  Status of Service:  In process, will continue to follow  If discussed at Long Length of Stay Meetings, dates discussed:    Additional Comments:  Dessa Phi, RN 09/21/2016, 2:02 PM

## 2016-09-21 NOTE — Evaluation (Addendum)
Physical Therapy Evaluation Patient Details Name: Amanda Richardson MRN: [id number] DOB: [birth year] Today's Date: 09/21/2016   History of Present Illness  Amanda Richardson is a 83 y.o. female with medical history significant of hypertension, CAD, CABG, dCHF, bullous pemphigoid, thrombocytopenia, stroke, s/p of nephrectomy, ischemic bowel disease, mitral valve replacement with mechanical valves 2001 on coumadin, DVT, atrial fibrillation, hypothyroidism, who presents with fever, confusion and leg pain.  Clinical Impression  The patient ambulated with 1 assist and RW x 100' x 2. The patient has family in  And out and [facility] services. Strongly recommend that patient have 24 hour caregivers until back to baseline.Pt admitted with above diagnosis. Pt currently with functional limitations due to the deficits listed below (see PT Problem List). Pt will benefit from skilled PT to increase their independence and safety with mobility to allow discharge to the venue listed below.       Follow Up Recommendations  HHPT    Equipment Recommendations    none   Recommendations for Other Services       Precautions / Restrictions Precautions Precautions: Fall Precaution Comments: painful skin lesions      Mobility  Bed Mobility               General bed mobility comments: in recliner  Transfers Overall transfer level: Needs assistance Equipment used: Rolling walker (2 wheeled) Transfers: Sit to/from Stand Sit to Stand: Min assist         General transfer comment: steady assist to rise, cues for hand placement  Ambulation/Gait Ambulation/Gait assistance: Min assist Ambulation Distance (Feet): 100 Feet (x 2) Assistive device: Rolling walker (2 wheeled) Gait Pattern/deviations: Step-to pattern;Step-through pattern     General Gait Details: gait is  steady, assist to not roll into objects with Rw  Stairs            Wheelchair Mobility    Modified Rankin (Stroke Patients Only)        Balance Overall balance assessment: History of Falls;Needs assistance Sitting-balance support: Feet supported;No upper extremity supported Sitting balance-Leahy Scale: Fair     Standing balance support: During functional activity;Bilateral upper extremity supported Standing balance-Leahy Scale: Poor Standing balance comment: relies on UE's for suppoert                             Pertinent Vitals/Pain Pain Assessment: Faces Faces Pain Scale: Hurts even more Pain Location: left elbow, legs Pain Descriptors / Indicators: Discomfort;Grimacing;Sore    Home Living Family/patient expects to be discharged to:: Private residence Living Arrangements: Alone Available Help at Discharge: Family;Available 24 hours/day Type of Home: House Home Access: Stairs to enter Entrance Stairs-Rails: Right Entrance Stairs-Number of Steps: 2 Home Layout: One level Home Equipment: Walker - 2 wheels;Cane - single point;Bedside commode;Shower seat;Grab bars - toilet;Grab bars - tub/shower;Hand held shower head Additional Comments: faMILY IN AND OUT MULTIPLE TIMES DURING THE DAY.    Prior Function Level of Independence: Needs assistance;Independent with assistive device(s)   Gait / Transfers Assistance Needed: uses RW in side  ADL's / Homemaking Assistance Needed: needs assistance for bathing, but can do bird bath, family assists with shopping  Comments: family is available to help as needed      Hand Dominance        Extremity/Trunk Assessment   Upper Extremity Assessment Upper Extremity Assessment: Generalized weakness    Lower Extremity Assessment Lower Extremity Assessment: Generalized weakness    Cervical / Trunk  Assessment Cervical / Trunk Assessment: Kyphotic  Communication      Cognition Arousal/Alertness: Awake/alert Behavior During Therapy: WFL for tasks assessed/performed Overall Cognitive Status: Impaired/Different from baseline Area of Impairment:  Orientation                 Orientation Level: Time                    General Comments      Exercises     Assessment/Plan    PT Assessment    PT - End of Session  Equipment Utilized During Treatment Gait belt     Activity Tolerance Patient tolerated treatment well     Patient left in chair; with chair alarm set     Nurse Communication Mobility status     PT Assessment  PT Recommendation/Assessment Patient needs continued PT services   PT Visit Diagnosis Unsteadiness on feet (R26.81) byHill,KarenE,PT at05/31/181249   PT Problem List Decreased strength; Decreased activity tolerance; Decreased balance; Decreased mobility; Decreased skin integrity byHill,KarenE,PT at05/31/181249   Barriers to Discharge    Barriers to Discharge Comments    PT Plan  PT Frequency (ACUTE ONLY) Min 3X/week   PT Treatment/Interventions (ACUTE ONLY) DME instruction; Gait training; Functional mobility training; Therapeutic activities; Patient/family education   AM-PAC PT "6 Clicks" Daily Activity Outcome Measure  Difficulty turning over in bed (including adjusting bedclothes, sheets and blankets)? A Little byHill,KarenE,PT at05/31/181249   Difficulty moving from lying on back to sitting on the side of the bed?  A Little   Difficulty sitting down on and standing up from a chair with arms (e.g., wheelchair, bedside commode, etc,.)? A Little byHill,KarenE,PT at05/31/181249   Help needed moving to and from a bed to chair (including a wheelchair)? A Lot   Help needed walking in hospital room? A Lot   Help needed climbing 3-5 steps with a railing?  A Lot   6 Click Score 15 (calculated)   Mobility G Code  CK (calculated) byHill,KarenE,PT at05/31/181249   PT Recommendation  Recommendations for Other Services    Follow Up Recommendations Home health PT; Supervision/Assistance - 24 hour byHill,KarenE,PT at05/31/181249   PT equipment None  recommended by PT byHill,KarenE,PT at05/31/181249   Individuals Consulted  Consulted and Agree with Results and Recommendations Patient; Family member/caregiver byHill,KarenE,PT at05/31/181249   Family Member Consulted daughter       Goals: to go home          PT Goals (Current goals can be found in the Care Plan section)  Acute Rehab PT Goals Patient Stated Goal: to walk, get my strength PT Goal Formulation: With patient/family Time For Goal Achievement: 10/05/16 Potential to Achieve Goals: Good                 Co-evaluation                       Time: 1610-9604 PT Time Calculation (min) (ACUTE ONLY): 22 min   Charges:   PT Evaluation $PT Eval Low Complexity: 1 Procedure     PT G CodesTresa Endo PT [phone]   Claretha Cooper 09/21/2016, 12:46 PM

## 2016-09-21 NOTE — Progress Notes (Signed)
PROGRESS NOTE  Amanda Richardson BJY:[id number] DOB: 11/13/1923 DOA: 09/20/2016 PCP: Merrilee Seashore, MD  HPI/Recap of past 24 hours:  Fever subsided, Sitting in chair, denies pain, no fever, report left leg feeling better,  aaox3  Assessment/Plan: Principal Problem:   Bilateral lower leg cellulitis Active Problems:   H/O mechanical mitral valve replacement 2001   Hypertension   H/O coronary artery bypass surgery   Chronic atrial fibrillation (HCC)   Hypothyroidism   Chronic anticoagulation   Chronic diastolic CHF (congestive heart failure) (HCC)   Chronic venous insufficiency   Acute metabolic encephalopathy   Sepsis ([place])   Sepsis due to Bilateral lower leg cellulitis, left worse than right:   -Pt has erythema, tenderness and warmth in both lower legs, consistent with cellulitis. Patient meets criteria for sepsis with fever, tachycardia, tachypnea and leukocytosis. Lactic acid is normal. Hemodynamically stable currently. - Empiric antimicrobial treatment with vancomycin and Zosyn per pharmacy started on admission, mrsa screening negative, will d/c vanc - Blood cultures x 2  Pending,  ESR and CRP mildly elevated - wound care consult - Procalcitonin  13.14, lactic acid 2.2, repeat in am. - IVF: 1.0 L of NS bolus (patient has congestive heart failure, limiting aggressive IV fluids treatment). Now off ivf. Will get left ankle x ray and check uric acid level, due to significant left ankle edema  H/O mechanical mitral valve replacement 2001: on coumadin, INR 2.27 -continue coumadin per pharm  DVT: -on coumadin  Chronic Atrial Fibrillation: CHA2DS2-VASc Score is 8, needs oral anticoagulation. Patient is on Coumadin at home. INR is 2.27 on admission. Heart rate is 110-120. -continue coumadin and atenolol  CAD: s/p of CABG. No CP -continue atenolol  Hypothyroidism: Last TSH was 3.56 on 12/24/15 -Continue home Synthroid  Hypertension: -Hold lisinopril due to acute  renal injury -Continue atenolol -IV hydralazine when necessary  Mild AKI:  Cr baseline 0.76 in 07/2016,  cre 1.18 on admission, cr 1.24 this am, UA no infection -IVF as above -f/u by BMP  Chronic diastolic CHF (congestive heart failure) ([place]):  -2-D echo on 10/10/13 showed EF of 55-60%. Patient has bilateral leg edema, but no JVD. No respiratory distress. No pulmonary edema on chest x-ray. CHF seems to be compensated. -Hold Lasix due to sepsis - BNP elevated at 0865   Acute metabolic encephalopathy: mild, currently oriented 3. likely due to ongoing infection -Frequent neuro check -Treat sepsis as above   Bullous pemphigoid, chronic - Continue Temovate, niacinamide  DVT ppx: On Coumadin Code Status: DNR (I discussed with patient in the presence of her daughter, and explained the meaning of CODE STATUS. Patient wants to be DNR) Family Communication: patient Disposition Plan:  home with home health Consults called: wound care   Procedures:  none  Antibiotics:  vanc from admission to 5/31  Zosyn from admission   Objective: BP 119/75 (BP Location: Right Arm)   Pulse 96   Temp 98.3 F (36.8 C) (Oral)   Resp 18   Ht 5' 5"  (1.651 m)   Wt 55 kg (121 lb 4.1 oz)   SpO2 97%   BMI 20.18 kg/m   Intake/Output Summary (Last 24 hours) at 09/21/16 1308 Last data filed at 09/21/16 0930  Gross per 24 hour  Intake              180 ml  Output              200 ml  Net              -  20 ml   Filed Weights   09/21/16 0234  Weight: 55 kg (121 lb 4.1 oz)    Exam:   General:  Frail elderly, NAD  Cardiovascular: IRRR  Respiratory: CTABL  Abdomen: Soft/ND/NT, positive BS  Musculoskeletal: mild edema right lower extremity, significant edema left lower extremity , especially around left ankle, good peripheral pulse  Neuro: aaox3  Data Reviewed: Basic Metabolic Panel:  Recent Labs Lab 09/20/16 2108 09/21/16 0445  NA 137 137  K 3.6 3.5  CL 103 105  CO2 24 24    GLUCOSE 128* 141*  BUN 24* 23*  CREATININE 1.18* 1.24*  CALCIUM 9.1 8.4*   Liver Function Tests:  Recent Labs Lab 09/20/16 2108  AST 50*  ALT 26  ALKPHOS 102  BILITOT 1.4*  PROT 7.3  ALBUMIN 3.7   No results for input(s): LIPASE, AMYLASE in the last 168 hours. No results for input(s): AMMONIA in the last 168 hours. CBC:  Recent Labs Lab 09/20/16 2108 09/21/16 0445  WBC 14.7* 18.9*  NEUTROABS 13.0*  --   HGB 12.5 11.3*  HCT 36.9 33.1*  MCV 92.0 92.2  PLT 147* 117*   Cardiac Enzymes:   No results for input(s): CKTOTAL, CKMB, CKMBINDEX, TROPONINI in the last 168 hours. BNP (last 3 results)  Recent Labs  09/21/16 0138  BNP 1,143.1*    ProBNP (last 3 results) No results for input(s): PROBNP in the last 8760 hours.  CBG: No results for input(s): GLUCAP in the last 168 hours.  Recent Results (from the past 240 hour(s))  MRSA PCR Screening     Status: None   Collection Time: 09/21/16  8:00 AM  Result Value Ref Range Status   MRSA by PCR NEGATIVE NEGATIVE Final    Comment:        The GeneXpert MRSA Assay (FDA approved for NASAL specimens only), is one component of a comprehensive MRSA colonization surveillance program. It is not intended to diagnose MRSA infection nor to guide or monitor treatment for MRSA infections.      Studies: Dg Chest 2 View  Result Date: 09/20/2016 CLINICAL DATA:  83 y/o  F; Fever, AMS. EXAM: CHEST  2 VIEW COMPARISON:  12/17/2015 chest radiograph. FINDINGS: Stable severe cardiomegaly. Mitral annuloplasty. Post median sternotomy with wires in alignment. Status post CABG. Aortic atherosclerosis with calcification. Pulmonary venous hypertension. No focal consolidation. No pleural effusion or pneumothorax. Exaggerated thoracic kyphosis. IMPRESSION: Severe cardiomegaly. Pulmonary venous hypertension. No focal consolidation identified. Electronically Signed   By: Kristine Garbe M.D.   On: 09/20/2016 21:24    Scheduled  Meds: . acidophilus  1 capsule Oral QODAY  . atenolol  25 mg Oral Daily  . calcium-vitamin D  1 tablet Oral QPM  . cholecalciferol  1,000 Units Oral QPM  . latanoprost  1 drop Both Eyes QHS  . levothyroxine  50 mcg Oral QAC breakfast  . multivitamin with minerals  1 tablet Oral QPM  . niacinamide  500 mg Oral BID WC  . sodium chloride flush  3 mL Intravenous Q12H  . warfarin  6 mg Oral ONCE-1800  . Warfarin - Pharmacist Dosing Inpatient   Does not apply q1800    Continuous Infusions: . piperacillin-tazobactam 3.375 g (09/21/16 0807)  . vancomycin         Ulices Maack MD, PhD  [facility] Pager [phone]. If 7PM-7AM, please contact night-coverage at [email or web address], password [facility] 09/21/2016, 1:08 PM  LOS: 0 days

## 2016-09-21 NOTE — H&P (Signed)
History and Physical    Amanda Richardson QIO:[id number] DOB: Oct 09, 1923 DOA: 09/20/2016  Referring MD/NP/PA:   PCP: Merrilee Seashore, MD   Patient coming from:  The patient is coming from home.  At baseline, pt is partially dependent for most of ADL.   Chief Complaint: fever, confusion, leg pain  HPI: Amanda Richardson is a 83 y.o. female with medical history significant of hypertension, CAD, CABG, dCHF, bullous pemphigoid, thrombocytopenia, stroke, s/p of nephrectomy, ischemic bowel disease, mitral valve replacement with mechanical valves 2001 on coumadin, DVT, atrial fibrillation, hypothyroidism, who presents with fever, confusion and leg pain.  Per Patient daughters, patient was noted to have fever 103 and chills today. She is mildly confused. Patient does not have cough, shortness of breath or chest pain. She has chronic mild diarrhea, which has not changed. No nausea, vomiting, diarrhea or abdominal pain. Pt has multiple small lesions in both legs.  She has bilateral leg pain.  Her both legs are erythematous, tender and warm per her daughters. Patient moves all extremities. No unilateral weakness.  ED Course: pt was found to have WBC 14.7, lactate 1.35, INR 2.27, mild AKi with Cre 1.18, temperature 102.7, tachycardia, O2 sat 92-94% on room air, negative chest x-ray. Pt is and admitted to telemetry bed as inpatient.  Review of Systems:   General: has fevers, chills, has fatigue HEENT: no blurry vision, hearing changes or sore throat Respiratory: no dyspnea, coughing, wheezing CV: no chest pain, no palpitations GI: no nausea, vomiting, abdominal pain, has mild diarrhea, no  constipation GU: no dysuria, burning on urination, increased urinary frequency, hematuria  Ext: has leg edema and left pain Neuro: no unilateral weakness, numbness, or tingling, no vision change or hearing loss Skin: has multiple lesions in both legs. MSK: No muscle spasm, no deformity, no limitation of range of  movement in spin Heme: No easy bruising.  Travel history: No recent long distant travel.  Allergy:  Allergies  Allergen Reactions  . Heparin Anaphylaxis  . Codeine Other (See Comments)    Unknown     Past Medical History:  Diagnosis Date  . Allergic rhinitis   . Anemia    not applicable since LKG-40N when pt had hysterectomy   . Basal cell carcinoma   . Bilateral lower extremity edema    history of venous insufficiency  . Bullous pemphigoid   . Chronic anticoagulation   . Chronic atrial fibrillation ([place])   . Chronic diastolic CHF (congestive heart failure) ([place])   . Coronary artery disease    a. CABG 2001 at time of MVR.  Marland Kitchen DVT (deep venous thrombosis) ([place])    possible in 1990s and does not remember being on blood thinner  . Glaucoma   . H/O mitral valve replacement 2001   mechanical heart valve; family states indication may have been MR from MVP or  rheumatic heart disease  . Hypertension   . Hypothyroidism   . Ischemic bowel disease ([place])    missing 2/3 of bowel  . S/p nephrectomy    left  . Stroke [facility])    a. CT head 2018 showed old left anterior watershed infarct, old right caudate lacunar infarct.  . Thrombocytopenia ([place])   . Urinary incontinence     Past Surgical History:  Procedure Laterality Date  . ABDOMINAL HYSTERECTOMY  1960s  . APPENDECTOMY  1960s  . CORONARY ARTERY BYPASS GRAFT  01/2000  . HERNIA REPAIR    . HIP ARTHROPLASTY Left 12/18/2015   Procedure: HEMIARTHROPLASTY  HIP;  Surgeon: Tania Ade, MD;  Location: [facility];  Service: Orthopedics;  Laterality: Left;  . hx broken collarbone    . LAPAROSCOPIC SMALL BOWEL RESECTION  2004 or 5   adhesions and ischemic bowel  . MITRAL VALVE REPLACEMENT  01/2000   mechanical heart valve  . TONSILLECTOMY  1933  . TRANSTHORACIC ECHOCARDIOGRAM  02/2012   EF 60-65%, mild LVH; mildly thickened AV leaflets with sclerosis and moderate regurg; mechanical MV prosthesis; LA dilated; RVSP increased; RA severely  dilated; increased LA pressure; moderate TR    Social History:  reports that she has never smoked. She has never used smokeless tobacco. She reports that she does not drink alcohol or use drugs.  Family History:  Family History  Problem Relation Age of Onset  . Irregular heart beat Mother   . Other Mother        pellegra  . Heart failure Mother        had very swollen legs??  . Stroke Mother   . Prostate cancer Father        also skin cancer  . Heart attack Daughter   . Heart disease Brother   . Heart attack Brother 48       still alive     Prior to Admission medications   Medication Sig Start Date End Date Taking? Authorizing Provider  acetaminophen (TYLENOL) 325 MG tablet Take 2 tablets (650 mg total) by mouth every 6 (six) hours as needed for mild pain (or Fever >/= 101). 07/25/16  Yes Theodis Blaze, MD  atenolol (TENORMIN) 25 MG tablet Take 1 tablet (25 mg total) by mouth daily. 07/25/16  Yes Theodis Blaze, MD  calcium-vitamin D (OSCAL WITH D) 500-200 MG-UNIT per tablet Take 1 tablet by mouth every evening.    Yes [provider]  Cholecalciferol 2000 units CAPS Take 1 capsule by mouth every evening.   Yes [provider]  clobetasol ointment (TEMOVATE) 0.09 % Apply 1 application topically 2 (two) times daily as needed (blisters for bulous pemphigoid).    Yes [provider]  furosemide (LASIX) 40 MG tablet Take 1 tablet (40 mg total) by mouth daily. 08/11/14  Yes Elmahi, Rae Lips, MD  KLOR-CON M20 20 MEQ tablet Take 1 tablet by mouth daily.  08/22/14  Yes [provider]  latanoprost (XALATAN) 0.005 % ophthalmic solution Place 1 drop into both eyes at bedtime.    Yes [provider]  levothyroxine (SYNTHROID, LEVOTHROID) 50 MCG tablet Take 50 mcg by mouth daily. 05/05/14  Yes [provider]  lisinopril (PRINIVIL,ZESTRIL) 10 MG tablet Take 10 mg by mouth daily.  03/27/14  Yes [provider]  Multiple Vitamin (MULTIVITAMIN  WITH MINERALS) TABS Take 1 tablet by mouth every evening.    Yes [provider]  niacinamide 500 MG tablet Take 500 mg by mouth 2 (two) times daily with a meal.   Yes [provider]  PRESCRIPTION MEDICATION Apply 1 each topically as needed (wound care).   Yes [provider]  Probiotic Product (PROBIOTIC PO) Take 1 capsule by mouth every other day.   Yes [provider]  warfarin (COUMADIN) 5 MG tablet Take 1 tablet (5 mg total) by mouth daily at 6 PM. Please check INR on 4/4 and readjust the dose of Coumadin to maintain INR 2.5 - 3.5 07/25/16  Yes Theodis Blaze, MD    Physical Exam: Vitals:   09/20/16 2046 09/20/16 2308 09/20/16 2356 09/21/16 0033  BP:  125/88 118/69 109/74 109/74  Pulse: (!) 120 (!) 101  83  Resp: 12 20  (!) 22  Temp: (!) 102.7 F (39.3 C) (!) 100.9 F (38.3 C)    TempSrc: Oral Oral    SpO2: 92% 94% (!) 87% 96%   General: Not in acute distress HEENT:       Eyes: PERRL, EOMI, no scleral icterus.       ENT: No discharge from the ears and nose, no pharynx injection, no tonsillar enlargement.        Neck: No JVD, no bruit, no mass felt. Heme: No neck lymph node enlargement. Cardiac: S1/S2, mechanical mitral valve click, irregularly irregular rhythm, No gallops or rubs. Respiratory: No rales, wheezing, rhonchi or rubs. GI: Soft, nondistended, nontender, no rebound pain, no organomegaly, BS present. GU: No hematuria Ext: 2+ pitting leg edema bilaterally. 2+DP/PT pulse bilaterally. Musculoskeletal: No joint deformities, No joint redness or warmth, no limitation of ROM in spin. Skin: has multiple skin lesions in both legs, has erythema, tenderness and warmth in both lower legs. Neuro: mildly confused, but oriented X3, cranial nerves II-XII grossly intact, moves all extremities normally.  Psych: Patient is not psychotic, no suicidal or hemocidal ideation.  Labs on Admission: I have personally reviewed following labs and imaging  studies  CBC:  Recent Labs Lab 09/20/16 2108  WBC 14.7*  NEUTROABS 13.0*  HGB 12.5  HCT 36.9  MCV 92.0  PLT 092*   Basic Metabolic Panel:  Recent Labs Lab 09/20/16 2108  NA 137  K 3.6  CL 103  CO2 24  GLUCOSE 128*  BUN 24*  CREATININE 1.18*  CALCIUM 9.1   GFR: CrCl cannot be calculated (Unknown ideal weight.). Liver Function Tests:  Recent Labs Lab 09/20/16 2108  AST 50*  ALT 26  ALKPHOS 102  BILITOT 1.4*  PROT 7.3  ALBUMIN 3.7   No results for input(s): LIPASE, AMYLASE in the last 168 hours. No results for input(s): AMMONIA in the last 168 hours. Coagulation Profile:  Recent Labs Lab 09/20/16 2108  INR 2.27   Cardiac Enzymes: No results for input(s): CKTOTAL, CKMB, CKMBINDEX, TROPONINI in the last 168 hours. BNP (last 3 results) No results for input(s): PROBNP in the last 8760 hours. HbA1C: No results for input(s): HGBA1C in the last 72 hours. CBG: No results for input(s): GLUCAP in the last 168 hours. Lipid Profile: No results for input(s): CHOL, HDL, LDLCALC, TRIG, CHOLHDL, LDLDIRECT in the last 72 hours. Thyroid Function Tests: No results for input(s): TSH, T4TOTAL, FREET4, T3FREE, THYROIDAB in the last 72 hours. Anemia Panel: No results for input(s): VITAMINB12, FOLATE, FERRITIN, TIBC, IRON, RETICCTPCT in the last 72 hours. Urine analysis:    Component Value Date/Time   COLORURINE YELLOW 07/13/2016 2013   APPEARANCEUR CLEAR 07/13/2016 2013   LABSPEC 1.011 07/13/2016 2013   PHURINE 7.0 07/13/2016 2013   GLUCOSEU NEGATIVE 07/13/2016 2013   HGBUR MODERATE (A) 07/13/2016 2013   BILIRUBINUR NEGATIVE 07/13/2016 2013   KETONESUR NEGATIVE 07/13/2016 2013   PROTEINUR NEGATIVE 07/13/2016 2013   UROBILINOGEN 4.0 (H) 08/04/2014 1608   NITRITE NEGATIVE 07/13/2016 2013   LEUKOCYTESUR NEGATIVE 07/13/2016 2013   Sepsis Labs: @LABRCNTIP (procalcitonin:4,lacticidven:4) )No results found for this or any previous visit (from the past 240 hour(s)).    Radiological Exams on Admission: Dg Chest 2 View  Result Date: 09/20/2016 CLINICAL DATA:  83 y/o  F; Fever, AMS. EXAM: CHEST  2 VIEW COMPARISON:  12/17/2015 chest radiograph. FINDINGS: Stable severe cardiomegaly. Mitral annuloplasty. Post  median sternotomy with wires in alignment. Status post CABG. Aortic atherosclerosis with calcification. Pulmonary venous hypertension. No focal consolidation. No pleural effusion or pneumothorax. Exaggerated thoracic kyphosis. IMPRESSION: Severe cardiomegaly. Pulmonary venous hypertension. No focal consolidation identified. Electronically Signed   By: Kristine Garbe M.D.   On: 09/20/2016 21:24     EKG:  Not done in ED, will get one.   Assessment/Plan Principal Problem:   Bilateral lower leg cellulitis Active Problems:   H/O mechanical mitral valve replacement 2001   Hypertension   H/O coronary artery bypass surgery   Chronic atrial fibrillation (HCC)   Hypothyroidism   Chronic anticoagulation   Chronic diastolic CHF (congestive heart failure) (HCC)   Chronic venous insufficiency   Acute metabolic encephalopathy   Sepsis ([place])   Sepsis due to Bilateral lower leg cellulitis:  Pt has erythema, tenderness and warmth in both lower legs, consistent with cellulitis. Patient meets criteria for sepsis with fever, tachycardia, tachypnea and leukocytosis. Lactic acid is normal. Hemodynamically stable currently.  - will admit to tele bed - Empiric antimicrobial treatment with vancomycin and Zosyn per pharmacy - Blood cultures x 2  - ESR and CRP - wound care consult - will get Procalcitonin and trend lactic acid levels per sepsis protocol. - IVF: 1.0 L of NS bolus (patient has congestive heart failure, limiting aggressive IV fluids treatment).  H/O mechanical mitral valve replacement 2001: on coumadin, INR 2.27 -continue coumadin per pharm  DVT: -on coumadin  Atrial Fibrillation: CHA2DS2-VASc Score is 8, needs oral anticoagulation. Patient  is on Coumadin at home. INR is 2.27 on admission. Heart rate is 110-120. -continue coumadin and atenolol  CAD: s/p of CABG. No CP -continue atenolol  Hypothyroidism: Last TSH was 3.56 on 12/24/15 -Continue home Synthroid  Hypertension: -Hold lisinopril due to acute renal injury -Continue atenolol -IV hydralazine when necessary  Mild AKI: cre 1.18. -IVF as above -f/u by BMP  Chronic diastolic CHF (congestive heart failure) ([facility]): 2-D echo on 10/10/13 showed EF of 55-60%. Patient has bilateral leg edema, but no JVD. No respiratory distress. No pulmonary edema on chest x-ray. CHF seems to be compensated. -Hold Lasix due to sepsis -check BNP  Acute metabolic encephalopathy: mild, currently oriented 3. likely due to ongoing infection -Frequent neuro check -Treat sepsis as above   DVT ppx: On Coumadin Code Status: DNR (I discussed with patient in the presence of her daughter, and explained the meaning of CODE STATUS. Patient wants to be DNR) Family Communication: Yes, patient's 2 daughters at bed side Disposition Plan:  Anticipate discharge back to previous home environment Consults called: none  Admission status: Inpatient/tele    Date of Service 09/21/2016    Ivor Costa [facility] Pager [phone]  If 7PM-7AM, please contact night-coverage [email or web address] Password [email or web address] 09/21/2016, 1:02 AM

## 2016-09-21 NOTE — Progress Notes (Signed)
ANTICOAGULATION CONSULT NOTE - Follow Up Consult  Pharmacy Consult for Warfarin Indication: atrial fibrillation and mechanical MVR  Allergies  Allergen Reactions  . Heparin Anaphylaxis  . Codeine Other (See Comments)    Unknown     Patient Measurements: Height: 5\' 5"  (165.1 cm) Weight: 121 lb 4.1 oz (55 kg) IBW/kg (Calculated) : 57  Vital Signs: Temp: 98.3 F (36.8 C) (05/31 0609) Temp Source: Oral (05/31 0609) BP: 119/75 (05/31 0609) Pulse Rate: 96 (05/31 0609)  Labs:  Recent Labs  09/20/16 2108 09/21/16 0445  HGB 12.5 11.3*  HCT 36.9 33.1*  PLT 147* 117*  LABPROT 25.5* 28.8*  INR 2.27 2.65  CREATININE 1.18* 1.24*    Estimated Creatinine Clearance: 25.7 mL/min (A) (by C-G formula based on SCr of 1.24 mg/dL (H)).   Medications:  Scheduled:  . acidophilus  1 capsule Oral QODAY  . atenolol  25 mg Oral Daily  . calcium-vitamin D  1 tablet Oral QPM  . cholecalciferol  1,000 Units Oral QPM  . latanoprost  1 drop Both Eyes QHS  . levothyroxine  50 mcg Oral QAC breakfast  . multivitamin with minerals  1 tablet Oral QPM  . niacinamide  500 mg Oral BID WC  . sodium chloride flush  3 mL Intravenous Q12H  . Warfarin - Pharmacist Dosing Inpatient   Does not apply q1800    Assessment: 56 yoF admitted on 5/31 with fever, confusion, and leg pain from bilateral LE cellulitis.  PMH is significant for Afib and mechanical mitral valve replacement (2001) on warfarin.  Pharmacy is consulted to continue warfarin dosing.  PTA Warfarin 5mg  daily Admission INR 2.27 (below goal)  Today, 09/21/2016: INR 2.65, therapeutic CBC: Hgb low and decreased to 11.3 (baseline ~10-11), Plt decreased to 117k No bleeding or complications noted Diet: heart healthy Drug-drug interactions: broad spectrum antibiotics may increase warfarin effects.   Goal of Therapy:  INR 2.5-3.5 Monitor platelets by anticoagulation protocol: Yes   Plan:  Warfarin 6 mg PO x 1 at 1800 Daily  PT/INR. Monitor for signs and symptoms of bleeding.   Gretta Arab PharmD, BCPS Pager [phone] 09/21/2016 7:09 AM

## 2016-09-21 NOTE — Progress Notes (Signed)
CRITICAL VALUE ALERT  Critical Value:  Lactic acid 2.2  Date & Time Notied:  09/21/16 0527  Provider Notified: Frederik Pear  Orders Received/Actions taken: No new orders placed

## 2016-09-21 NOTE — Consult Note (Signed)
[place] Nurse wound consult note Reason for Consult: Consult requested for BLE.  Pt states she was wearing an [place] boot to the left leg prior to admission and wounds were improving.  She has home health change it weekly. Wound type: Right leg with partial thickness wound where a previous blister has ruptured, according to the patient.  2.2X.5X.1cm, pink and dry, no odor, small amt yellow drainage. Left leg with several healing full thickness wounds; Left heel 3X3X.1cm, pink and dry, small amt yellow drainage. Left posterior leg with patchy areas of partial thickness skin loss; affected area is 2X2X.1cm, pink and moist, small amt yellow drainage, no odor Left upper leg with 2 healing full thickness wounds; 1X1X.1cm and .5X.5X.1cm; both are pink and dry, small amt yellow drainage. Dressing procedure/placement/frequency: Foam dressing to right leg to protect and promote healing.  Continue present plan of care with [place] boots to left leg; paged ortho tech to apply and change Q Thursday.  Pt can resume follow-up with home health for continued compression wraps after discharge. Discussed plan of care with patient and she verbalized understanding. Please re-consult if further assistance is needed.  Thank-you,  Julien Girt MSN, [place], [place], [place], [place]

## 2016-09-21 NOTE — Progress Notes (Signed)
Pharmacy Antibiotic and Anticoagulation Note  Amanda Richardson is a 83 y.o. female with fever admitted on 09/20/2016 with cellulitis.  Pharmacy has been consulted for vancomycin dosing and warfarin dosing  Plan: Vancomycin 1 Gm x1 then 500 mg IV q24h VT=10-15 mg/L ZEI (MD) Warfarin 6 mg x1 now Daily PT/INR F/u cultures/levels as needed Daily Scr    Temp (24hrs), Avg:101.8 F (38.8 C), Min:100.9 F (38.3 C), Max:102.7 F (39.3 C)   Recent Labs Lab 09/20/16 2108 09/20/16 2122 09/21/16 0015  WBC 14.7*  --   --   CREATININE 1.18*  --   --   LATICACIDVEN  --  1.35 1.46    CrCl cannot be calculated (Unknown ideal weight.).    Allergies  Allergen Reactions  . Heparin Anaphylaxis  . Codeine Other (See Comments)    Unknown     Antimicrobials this admission: 5/30 zosyn >>  5/30 vancomycin >>   Dose adjustments this admission:   Microbiology results:  BCx:   UCx:    Sputum:    MRSA PCR:   Thank you for allowing pharmacy to be a part of this patient's care.  Dorrene German 09/21/2016 1:10 AM

## 2016-09-22 ENCOUNTER — Inpatient Hospital Stay (HOSPITAL_COMMUNITY): Payer: Medicare Other

## 2016-09-22 DIAGNOSIS — L8990 Pressure ulcer of unspecified site, unspecified stage: Secondary | ICD-10-CM | POA: Insufficient documentation

## 2016-09-22 LAB — CBC WITH DIFFERENTIAL/PLATELET
Basophils Absolute: 0 10*3/uL (ref 0.0–0.1)
Basophils Relative: 0 %
Eosinophils Absolute: 0.2 10*3/uL (ref 0.0–0.7)
Eosinophils Relative: 2 %
HEMATOCRIT: 32.5 % — AB (ref 36.0–46.0)
Hemoglobin: 11.1 g/dL — ABNORMAL LOW (ref 12.0–15.0)
LYMPHS PCT: 22 %
Lymphs Abs: 1.8 10*3/uL (ref 0.7–4.0)
MCH: 31.4 pg (ref 26.0–34.0)
MCHC: 34.2 g/dL (ref 30.0–36.0)
MCV: 92.1 fL (ref 78.0–100.0)
MONO ABS: 0.6 10*3/uL (ref 0.1–1.0)
Monocytes Relative: 8 %
NEUTROS ABS: 5.5 10*3/uL (ref 1.7–7.7)
Neutrophils Relative %: 68 %
Platelets: 117 10*3/uL — ABNORMAL LOW (ref 150–400)
RBC: 3.53 MIL/uL — ABNORMAL LOW (ref 3.87–5.11)
RDW: 14.1 % (ref 11.5–15.5)
WBC: 8.1 10*3/uL (ref 4.0–10.5)

## 2016-09-22 LAB — BASIC METABOLIC PANEL
Anion gap: 7 (ref 5–15)
BUN: 26 mg/dL — ABNORMAL HIGH (ref 6–20)
CALCIUM: 8.4 mg/dL — AB (ref 8.9–10.3)
CO2: 24 mmol/L (ref 22–32)
CREATININE: 1.2 mg/dL — AB (ref 0.44–1.00)
Chloride: 105 mmol/L (ref 101–111)
GFR calc Af Amer: 44 mL/min — ABNORMAL LOW (ref >60)
GFR calc non Af Amer: 38 mL/min — ABNORMAL LOW (ref >60)
GLUCOSE: 94 mg/dL (ref 65–99)
Potassium: 3.5 mmol/L (ref 3.5–5.1)
Sodium: 136 mmol/L (ref 135–145)

## 2016-09-22 LAB — URINE CULTURE: Culture: <10000 — AB

## 2016-09-22 LAB — PROTIME-INR
INR: 3.37
PROTHROMBIN TIME: 34.9 s — AB (ref 11.4–15.2)

## 2016-09-22 LAB — URIC ACID: Uric Acid, Serum: 7.1 mg/dL — ABNORMAL HIGH (ref 2.3–6.6)

## 2016-09-22 LAB — LACTIC ACID, PLASMA: Lactic Acid, Venous: 0.8 mmol/L (ref 0.5–1.9)

## 2016-09-22 MED ORDER — AMOXICILLIN-POT CLAVULANATE 250-62.5 MG/5ML PO SUSR: 500.0000 mg | Freq: Two times a day (BID) | ORAL | 0 refills | Status: AC

## 2016-09-22 MED ORDER — AMOXICILLIN-POT CLAVULANATE 500-125 MG PO TABS: 1.0000 | ORAL_TABLET | Freq: Two times a day (BID) | ORAL | 0 refills | Status: DC

## 2016-09-22 MED ORDER — LISINOPRIL 2.5 MG PO TABS: 2.5000 mg | ORAL_TABLET | Freq: Every day | ORAL | 0 refills | Status: DC

## 2016-09-22 MED ORDER — WARFARIN SODIUM 2 MG PO TABS
2.0000 mg | ORAL_TABLET | Freq: Once | ORAL | Status: DC
  Filled 2016-09-22: qty 1

## 2016-09-22 MED ORDER — ZINC OXIDE 40 % EX OINT
TOPICAL_OINTMENT | Freq: Two times a day (BID) | CUTANEOUS | Status: DC
  Administered 2016-09-22: 13:00:00 via TOPICAL
  Filled 2016-09-22: qty 114

## 2016-09-22 MED ORDER — ZINC OXIDE 40 % EX OINT: TOPICAL_OINTMENT | Freq: Two times a day (BID) | CUTANEOUS | 0 refills | Status: DC

## 2016-09-22 MED ORDER — POTASSIUM CHLORIDE CRYS ER 20 MEQ PO TBCR
40.0000 meq | EXTENDED_RELEASE_TABLET | Freq: Once | ORAL | Status: AC
  Administered 2016-09-22: 40 meq via ORAL
  Filled 2016-09-22: qty 2

## 2016-09-22 MED ORDER — AMOXICILLIN-POT CLAVULANATE 500-125 MG PO TABS
1.0000 | ORAL_TABLET | Freq: Two times a day (BID) | ORAL | Status: DC
  Administered 2016-09-22: 500 mg via ORAL
  Filled 2016-09-22: qty 1

## 2016-09-22 MED ORDER — CARVEDILOL 6.25 MG PO TABS: 6.2500 mg | ORAL_TABLET | Freq: Two times a day (BID) | ORAL | 0 refills | Status: DC

## 2016-09-22 MED ORDER — FUROSEMIDE 40 MG PO TABS
40.0000 mg | ORAL_TABLET | ORAL | Status: DC
Start: 2016-09-22 — End: 2017-02-22

## 2016-09-22 NOTE — Consult Note (Signed)
[place] Nurse wound follow up Wound type:Seen by [facility] team for orders for leg compression and wound care to lower legs.  Discharging today.  Would like recommendations for wound care to coccyx.  Stage 2 pressure injury to coccyx Measurement:1 cm x 1 cm x 0.1 cm  Wound KVQ:OHCO and moist Drainage (amount, consistency, odor) scant serous Periwound:intact Dressing procedure/placement/frequency:Cleanse buttocks and sacrum with soap and water daily.  Apply Boudreaux's butt paste twice daily.  Send home with patient at discharge today.  I have discussed how to use this with patient.   Will not follow at this time.  Please re-consult if needed.  Domenic Moras RN BSN [place] Pager [phone]

## 2016-09-22 NOTE — Progress Notes (Signed)
ANTICOAGULATION CONSULT NOTE - Follow Up Consult  Pharmacy Consult for Warfarin Indication: atrial fibrillation and mechanical MVR  Allergies  Allergen Reactions  . Heparin Anaphylaxis  . Codeine Other (See Comments)    Unknown     Patient Measurements: Height: 5\' 5"  (165.1 cm) Weight: 121 lb 4.1 oz (55 kg) IBW/kg (Calculated) : 57  Vital Signs: Temp: 98.1 F (36.7 C) (06/01 0523) Temp Source: Oral (06/01 0523) BP: 136/69 (06/01 0523) Pulse Rate: 78 (06/01 0523)  Labs:  Recent Labs  09/20/16 2108 09/21/16 0445 09/22/16 0451  HGB 12.5 11.3* 11.1*  HCT 36.9 33.1* 32.5*  PLT 147* 117* 117*  LABPROT 25.5* 28.8* 34.9*  INR 2.27 2.65 3.37  CREATININE 1.18* 1.24* 1.20*    Estimated Creatinine Clearance: 26.5 mL/min (A) (by C-G formula based on SCr of 1.2 mg/dL (H)).   Medications:  Scheduled:  . acidophilus  1 capsule Oral QODAY  . amoxicillin-clavulanate  1 tablet Oral BID  . atenolol  25 mg Oral Daily  . calcium-vitamin D  1 tablet Oral QPM  . cholecalciferol  1,000 Units Oral QPM  . latanoprost  1 drop Both Eyes QHS  . levothyroxine  50 mcg Oral QAC breakfast  . multivitamin with minerals  1 tablet Oral QPM  . niacinamide  500 mg Oral BID WC  . sodium chloride flush  3 mL Intravenous Q12H  . Warfarin - Pharmacist Dosing Inpatient   Does not apply q1800    Assessment: Amanda Richardson admitted on 5/31 with fever, confusion, and leg pain from bilateral LE cellulitis.  PMH is significant for Afib and mechanical mitral valve replacement (2001) on warfarin.  Pharmacy is consulted to continue warfarin dosing.  PTA Warfarin 5mg  daily Admission INR 2.27 (below goal)  Today, 09/22/2016: - INR is therapeutic but increased sharply from 2.65 to 3.37 today - CBC: Hgb low and decreased to 11.1 (baseline ~10-11), Plt stable at 117K - No bleeding documented - Diet: heart healthy - Drug-drug interactions: abx can make patient more sensitive to warfarin (vanc/zosyn switched to  augmentin on 5/31)   Goal of Therapy:  INR 2.5-3.5 Monitor platelets by anticoagulation protocol: Yes   Plan:  - Warfarin 2 mg PO x 1 at 1800 - Daily PT/INR. - Monitor for signs and symptoms of bleeding.   Dia Sitter, PharmD, BCPS 09/22/2016 10:40 AM

## 2016-09-22 NOTE — Care Management Note (Signed)
Case Management Note  Patient Details  Name: KAITLYN FRANKO MRN: [id number] Date of Birth: 1923/08/24  Subjective/Objective:  D/c home w/HHC-[facility] rep Tim aware of d/c today & [place] orders.                  Action/Plan:d/c home w/HHC.   Expected Discharge Date:                  Expected Discharge Plan:  [place]  In-House Referral:     Discharge planning Services  CM Consult  Post Acute Care Choice:  (S) [place] (Active w/[facility]-HHRN/HHPT) Choice offered to:  Patient  DME Arranged:    DME Agency:  [facility] (formerly [employer])  [place]:  RN, PT [place] Agency:  [facility] ([facility] [employer])  Status of Service:  Completed, signed off  If discussed at [employer], dates discussed:    Additional Comments:  Dessa Phi, RN 09/22/2016, 9:15 AM

## 2016-09-22 NOTE — Discharge Summary (Addendum)
Discharge Summary  Amanda Richardson NGE:[id number] DOB: Oct 21, 1923  PCP: Merrilee Seashore, MD  Admit date: 09/20/2016 Discharge date: 09/22/2016  Time spent: >58mns, more than 50% time spent on coordination of care and patient /family counseling   Recommendations for Outpatient Follow-up:  1. F/u with PMD within a week  for hospital discharge follow up, repeat cbc/bmp at follow up 2. F/u with coumadin clinic 3. Continue home healht  Discharge Diagnoses:  Active Hospital Problems   Diagnosis Date Noted  . Bilateral lower leg cellulitis 09/21/2016  . Pressure injury of skin 09/22/2016  . Acute metabolic encephalopathy 040/01/2724 . Sepsis ([facility] 09/21/2016  . Chronic diastolic CHF (congestive heart failure) ([facility] 07/16/2016  . Chronic venous insufficiency 07/16/2016  . H/O mechanical mitral valve replacement 2001   . Chronic atrial fibrillation ([facility]   . Chronic anticoagulation   . Hypertension   . H/O coronary artery bypass surgery   . Hypothyroidism     Resolved Hospital Problems   Diagnosis Date Noted Date Resolved  No resolved problems to display.    Discharge Condition: stable  Diet recommendation: heart healthy  Filed Weights   09/21/16 0234  Weight: 55 kg (121 lb 4.1 oz)    History of present illness:  PCP: RMerrilee Seashore MD   Patient coming from:  The patient is coming from home.  At baseline, pt is partially dependent for most of ADL.   Chief Complaint: fever, confusion, leg pain  HPI: Amanda BAYONis a 83y.o. female with medical history significant of hypertension, CAD, CABG, dCHF, bullous pemphigoid, thrombocytopenia, stroke, s/p of nephrectomy, ischemic bowel disease, mitral valve replacement with mechanical valves 2001 on coumadin, DVT, atrial fibrillation, hypothyroidism, who presents with fever, confusion and leg pain.  Per Patient daughters, patient was noted to have fever 103 and chills today. She is mildly confused. Patient does  not have cough, shortness of breath or chest pain. She has chronic mild diarrhea, which has not changed. No nausea, vomiting, diarrhea or abdominal pain. Pt has multiple small lesions in both legs.  She has bilateral leg pain.  Her both legs are erythematous, tender and warm per her daughters. Patient moves all extremities. No unilateral weakness.  ED Course: pt was found to have WBC 14.7, lactate 1.35, INR 2.27, mild AKi with Cre 1.18, temperature 102.7, tachycardia, O2 sat 92-94% on room air, negative chest x-ray. Pt is and admitted to telemetry bed as inpatient.   Hospital Course:  Principal Problem:   Bilateral lower leg cellulitis Active Problems:   H/O mechanical mitral valve replacement 2001   Hypertension   H/O coronary artery bypass surgery   Chronic atrial fibrillation (HCC)   Hypothyroidism   Chronic anticoagulation   Chronic diastolic CHF (congestive heart failure) (HCC)   Chronic venous insufficiency   Acute metabolic encephalopathy   Sepsis ([facility]   Pressure injury of skin   Sepsis due toBilateral lower leg cellulitis, left worse than right: -Pt has erythema, tenderness and warmth in both lower legs, consistent with cellulitis. Patient meetscriteria for sepsis with fever, tachycardia, tachypnea and leukocytosis. Lactic acid is normal. Hemodynamically stable currently. - Empiric antimicrobial treatment with vancomycin and Zosyn per pharmacy started on admission, mrsa screening negative,  vanc d/ced - Blood cultures x 2  no growth,  ESR and CRP mildly elevated - wound care consult, continue unna boot to left leg  - Procalcitonin  13.14, lactic acid 2.2, repeat lactic acid at discharge 0.8. -s/p   1.0L of NS bolus(patient  has congestive heart failure, limiting aggressive IV fluids treatment).  off ivf. - left ankle x ray unremarkable joint space, no acute findings, she does has mildly elevated uric acid level at 7,  At time of discharge, she feels much better, denies  pain, erythema and edema has much improved, she is discharged on oral augmentin for additional 6 days to finish treatment. Patient prefer liquid form of augmentin, prescribed.  H/O mechanical mitral valve replacement 2001:on coumadin, INR 2.27 -she need to close follow with coumadin clinic while she is on abx  DVT: -on coumadin  Chronic Atrial Fibrillation: CHA2DS2-VASc Scoreis 8, needs oral anticoagulation. Patient is on Coumadin at home. INR is 2.27on admission. Heart rate is 110-120 initially, heart rate improved at discharge -continue coumadin and betablocker  CAD: s/p of CABG. No CP -change atenolol to coreg  Hypothyroidism: Last TSH was 3.56 on 12/24/15 -Continue home Synthroid  Hypertension: -Hold lisinopril due to acute renal injury -change atenolol to coreg, decrease lisinopril -IV hydralazine when necessary  Mild AKI:  Cr baseline 0.76 in 07/2016,  cre 1.18 on admission, cr 1.24 this am, UA no infection -cr 1.2 at discharge, she is discharged on decreased lasix ( from 4m daily to 453mevery MWF)   Chronic diastolic CHF (congestive heart failure) ([facility] -2-D echo on 10/10/13 showed EF of 55-60%. Patient has bilateral leg edema, but no JVD. No respiratory distress. No pulmonary edema on chest x-ray. CHF seems to be compensated. -Lasix held due to sepsis - she does not have edema at discharge, she is discharged on coreg, reduced dose lisinopril and reduced lasix frequency  Acute metabolic encephalopathy: mild,currently oriented 3. likely due to ongoing infection -resolved   Bullous pemphigoid, chronic - Continue Temovate, niacinamide  Stage II sacral decubitus ulcer: presented prior to hospitalization. Wound care input appreciated" Cleanse buttocks and sacrum with soap and water daily.  Apply Boudreaux's butt paste twice daily."  DVT ppx: On Coumadin Code Status:DNR (I discussed with patient in the presence of her daughter, and explained the meaning  of CODE STATUS. Patient wants to be DNR) Family Communication: patient and daughter at bedside Disposition Plan: home with home health Consults called:wound care   Procedures:  none  Antibiotics:  vanc from admission to 5/31  Zosyn from admission   Discharge Exam: BP 136/69 (BP Location: Right Arm)   Pulse 78   Temp 98.1 F (36.7 C) (Oral)   Resp 18   Ht 5' 5"  (1.651 m)   Wt 55 kg (121 lb 4.1 oz)   SpO2 97%   BMI 20.18 kg/m     General:  Frail elderly, NAD  Cardiovascular: IRRR  Respiratory: CTABL  Abdomen: Soft/ND/NT, positive BS  Musculoskeletal: mild edema right lower extremity has resolved, edema left lower extremity has much improved , unna boots on left leg,  good peripheral pulse  Neuro: aaox3   Discharge Instructions You were cared for by a hospitalist during your hospital stay. If you have any questions about your discharge medications or the care you received while you were in the hospital after you are discharged, you can call the unit and asked to speak with the hospitalist on call if the hospitalist that took care of you is not available. Once you are discharged, your primary care physician will handle any further medical issues. Please note that NO REFILLS for any discharge medications will be authorized once you are discharged, as it is imperative that you return to your primary care physician (or establish a  relationship with a primary care physician if you do not have one) for your aftercare needs so that they can reassess your need for medications and monitor your lab values.  Discharge Instructions    Diet - low sodium heart healthy    Complete by:  As directed    Face-to-face encounter (required for Medicare/Medicaid patients)    Complete by:  As directed    I Ahonesty Woodfin certify that this patient is under my care and that I, or a nurse practitioner or physician's assistant working with me, had a face-to-face encounter that meets the physician  face-to-face encounter requirements with this patient on 09/22/2016. The encounter with the patient was in whole, or in part for the following medical condition(s) which is the primary reason for home health care (List medical condition): FTt   The encounter with the patient was in whole, or in part, for the following medical condition, which is the primary reason for home health care:  FTT   I certify that, based on my findings, the following services are medically necessary home health services:   Nursing Physical therapy     Reason for Medically Necessary Home Health Services:  Skilled Nursing- Change/Decline in Patient Status   My clinical findings support the need for the above services:  Unable to leave home safely without assistance and/or assistive device   Further, I certify that my clinical findings support that this patient is homebound due to:  Unable to leave home safely without assistance   Home Health    Complete by:  As directed    To provide the following care/treatments:   PT RN     Increase activity slowly    Complete by:  As directed      Allergies as of 09/22/2016      Reactions   Heparin Anaphylaxis   Codeine Other (See Comments)   Unknown      Medication List    STOP taking these medications   atenolol 25 MG tablet Commonly known as:  TENORMIN     TAKE these medications   acetaminophen 325 MG tablet Commonly known as:  TYLENOL Take 2 tablets (650 mg total) by mouth every 6 (six) hours as needed for mild pain (or Fever >/= 101).   amoxicillin-clavulanate 250-62.5 MG/5ML suspension Commonly known as:  AUGMENTIN Take 10 mLs (500 mg total) by mouth 2 (two) times daily.   calcium-vitamin D 500-200 MG-UNIT tablet Commonly known as:  OSCAL WITH D Take 1 tablet by mouth every evening.   carvedilol 6.25 MG tablet Commonly known as:  COREG Take 1 tablet (6.25 mg total) by mouth 2 (two) times daily.   Cholecalciferol 2000 units Caps Take 1 capsule by mouth  every evening.   clobetasol ointment 0.05 % Commonly known as:  TEMOVATE Apply 1 application topically 2 (two) times daily as needed (blisters for bulous pemphigoid).   furosemide 40 MG tablet Commonly known as:  LASIX Take 1 tablet (40 mg total) by mouth every Monday, Wednesday, and Friday. May change to daily if bilateral lower legs start to have fluids retention. What changed:  when to take this  additional instructions   KLOR-CON M20 20 MEQ tablet Generic drug:  potassium chloride SA Take 1 tablet by mouth daily.   latanoprost 0.005 % ophthalmic solution Commonly known as:  XALATAN Place 1 drop into both eyes at bedtime.   levothyroxine 50 MCG tablet Commonly known as:  SYNTHROID, LEVOTHROID Take 50 mcg by mouth daily.  lisinopril 2.5 MG tablet Commonly known as:  ZESTRIL Take 1 tablet (2.5 mg total) by mouth daily. What changed:  medication strength  how much to take   liver oil-zinc oxide 40 % ointment Commonly known as:  DESITIN Apply topically 2 (two) times daily.   multivitamin with minerals Tabs tablet Take 1 tablet by mouth every evening.   niacinamide 500 MG tablet Take 500 mg by mouth 2 (two) times daily with a meal.   PRESCRIPTION MEDICATION Apply 1 each topically as needed (wound care).   PROBIOTIC PO Take 1 capsule by mouth every other day.   warfarin 5 MG tablet Commonly known as:  COUMADIN Take 1 tablet (5 mg total) by mouth daily at 6 PM. Please check INR on 4/4 and readjust the dose of Coumadin to maintain INR 2.5 - 3.5      Allergies  Allergen Reactions  . Heparin Anaphylaxis  . Codeine Other (See Comments)    Unknown    Follow-up Information    [facility] Follow up.   Specialty:  Home Health Services Why:  [facility] nursing,physical therapy Contact information: [place] [address] [phone]        Merrilee Seashore, MD Follow up in 1 week(s).   Specialty:  Internal Medicine Why:   hospital discharge follow up, repeat cbc/bmp at follow up, pmd to continue monitor uric acid level Contact information: [place] [address] [phone]        follow up with coumadin clinic in three days Follow up.            The results of significant diagnostics from this hospitalization (including imaging, microbiology, ancillary and laboratory) are listed below for reference.    Significant Diagnostic Studies: Dg Chest 2 View  Result Date: 09/20/2016 CLINICAL DATA:  83 y/o  F; Fever, AMS. EXAM: CHEST  2 VIEW COMPARISON:  12/17/2015 chest radiograph. FINDINGS: Stable severe cardiomegaly. Mitral annuloplasty. Post median sternotomy with wires in alignment. Status post CABG. Aortic atherosclerosis with calcification. Pulmonary venous hypertension. No focal consolidation. No pleural effusion or pneumothorax. Exaggerated thoracic kyphosis. IMPRESSION: Severe cardiomegaly. Pulmonary venous hypertension. No focal consolidation identified. Electronically Signed   By: Kristine Garbe M.D.   On: 09/20/2016 21:24   Dg Ankle 2 Views Left  Result Date: 09/21/2016 CLINICAL DATA:  83 year old with chronic left ankle edema and blistering. EXAM: LEFT ANKLE - 2 VIEW COMPARISON:  None. FINDINGS: Examination was performed portably with the ankle and foot wrapped in bandage material which obscures fine bony detail. No evidence of acute fracture or dislocation. Ankle mortise intact with well-preserved joint space for age. IMPRESSION: No acute osseous abnormality. Electronically Signed   By: Evangeline Dakin M.D.   On: 09/21/2016 19:09   Dg Hip Port Unilat With Pelvis 1v Left  Result Date: 09/22/2016 CLINICAL DATA:  Acute left hip pain without known injury. EXAM: DG HIP (WITH OR WITHOUT PELVIS) 1V PORT LEFT COMPARISON:  Radiographs of July 13, 2016. FINDINGS: Status post left hip arthroplasty. No acute fracture or dislocation is noted. IMPRESSION: No acute  abnormality seen in the left hip. Electronically Signed   By: Marijo Conception, M.D.   On: 09/22/2016 09:58    Microbiology: Recent Results (from the past 240 hour(s))  Culture, blood (Routine x 2)     Status: None (Preliminary result)   Collection Time: 09/20/16  9:08 PM  Result Value Ref Range Status   Specimen Description BLOOD  BLOOD LEFT FOREARM  Final   Special Requests   Final    BOTTLES DRAWN AEROBIC AND ANAEROBIC Blood Culture adequate volume   Culture   Final    NO GROWTH 2 DAYS Performed at [facility], [address] [place], [place] [address]    Report Status PENDING  Incomplete  Urine culture     Status: Abnormal   Collection Time: 09/21/16  6:37 AM  Result Value Ref Range Status   Specimen Description URINE, RANDOM  Final   Special Requests NONE  Final   Culture (A)  Final    <10,000 COLONIES/mL INSIGNIFICANT GROWTH Performed at [facility] [address], [place], [place] [address]    Report Status 09/22/2016 FINAL  Final  MRSA PCR Screening     Status: None   Collection Time: 09/21/16  8:00 AM  Result Value Ref Range Status   MRSA by PCR NEGATIVE NEGATIVE Final    Comment:        The GeneXpert MRSA Assay (FDA approved for NASAL specimens only), is one component of a comprehensive MRSA colonization surveillance program. It is not intended to diagnose MRSA infection nor to guide or monitor treatment for MRSA infections.      Labs: Basic Metabolic Panel:  Recent Labs Lab 09/20/16 2108 09/21/16 0445 09/22/16 0451  NA 137 137 136  K 3.6 3.5 3.5  CL 103 105 105  CO2 24 24 24   GLUCOSE 128* 141* 94  BUN 24* 23* 26*  CREATININE 1.18* 1.24* 1.20*  CALCIUM 9.1 8.4* 8.4*   Liver Function Tests:  Recent Labs Lab 09/20/16 2108  AST 50*  ALT 26  ALKPHOS 102  BILITOT 1.4*  PROT 7.3  ALBUMIN 3.7   No results for input(s): LIPASE, AMYLASE in the last 168 hours. No results for input(s): AMMONIA in the last 168 hours. CBC:  Recent  Labs Lab 09/20/16 2108 09/21/16 0445 09/22/16 0451  WBC 14.7* 18.9* 8.1  NEUTROABS 13.0*  --  5.5  HGB 12.5 11.3* 11.1*  HCT 36.9 33.1* 32.5*  MCV 92.0 92.2 92.1  PLT 147* 117* 117*   Cardiac Enzymes: No results for input(s): CKTOTAL, CKMB, CKMBINDEX, TROPONINI in the last 168 hours. BNP: BNP (last 3 results)  Recent Labs  09/21/16 0138  BNP 1,143.1*    ProBNP (last 3 results) No results for input(s): PROBNP in the last 8760 hours.  CBG: No results for input(s): GLUCAP in the last 168 hours.     SignedFlorencia Reasons MD, PhD  [facility] 09/22/2016, 12:56 PM

## 2016-09-22 NOTE — Progress Notes (Signed)
Pt experiencing redness, warmth and tenderness to L hip. Pt S/P Sx for Fx in 11/2015 and hematoma L hip 06/2016. On call notified.  Imaging ordered.

## 2016-09-25 LAB — CULTURE, BLOOD (ROUTINE X 2)
CULTURE: NO GROWTH
Special Requests: ADEQUATE

## 2016-11-29 ENCOUNTER — Inpatient Hospital Stay (HOSPITAL_COMMUNITY)
Admission: EM | Admit: 2016-11-29 | Discharge: 2016-12-05 | DRG: 603 | Disposition: A | Discharge status: Skilled Nursing Facility | Payer: Medicare Other | Attending: Internal Medicine | Admitting: Internal Medicine

## 2016-11-29 ENCOUNTER — Encounter (HOSPITAL_COMMUNITY): Payer: Self-pay | Admitting: Family Medicine

## 2016-11-29 DIAGNOSIS — D649 Anemia, unspecified: Secondary | ICD-10-CM | POA: Diagnosis present

## 2016-11-29 DIAGNOSIS — I482 Chronic atrial fibrillation, unspecified: Secondary | ICD-10-CM | POA: Diagnosis present

## 2016-11-29 DIAGNOSIS — Z951 Presence of aortocoronary bypass graft: Secondary | ICD-10-CM

## 2016-11-29 DIAGNOSIS — L0390 Cellulitis, unspecified: Secondary | ICD-10-CM | POA: Diagnosis present

## 2016-11-29 DIAGNOSIS — Z85828 Personal history of other malignant neoplasm of skin: Secondary | ICD-10-CM

## 2016-11-29 DIAGNOSIS — Z8673 Personal history of transient ischemic attack (TIA), and cerebral infarction without residual deficits: Secondary | ICD-10-CM

## 2016-11-29 DIAGNOSIS — L03116 Cellulitis of left lower limb: Secondary | ICD-10-CM | POA: Diagnosis not present

## 2016-11-29 DIAGNOSIS — I10 Essential (primary) hypertension: Secondary | ICD-10-CM | POA: Diagnosis present

## 2016-11-29 DIAGNOSIS — E039 Hypothyroidism, unspecified: Secondary | ICD-10-CM

## 2016-11-29 DIAGNOSIS — I5032 Chronic diastolic (congestive) heart failure: Secondary | ICD-10-CM

## 2016-11-29 DIAGNOSIS — Z66 Do not resuscitate: Secondary | ICD-10-CM | POA: Diagnosis present

## 2016-11-29 DIAGNOSIS — L129 Pemphigoid, unspecified: Secondary | ICD-10-CM | POA: Diagnosis present

## 2016-11-29 DIAGNOSIS — L120 Bullous pemphigoid: Secondary | ICD-10-CM | POA: Diagnosis present

## 2016-11-29 DIAGNOSIS — Z90710 Acquired absence of both cervix and uterus: Secondary | ICD-10-CM

## 2016-11-29 DIAGNOSIS — E876 Hypokalemia: Secondary | ICD-10-CM | POA: Diagnosis present

## 2016-11-29 DIAGNOSIS — H409 Unspecified glaucoma: Secondary | ICD-10-CM | POA: Diagnosis present

## 2016-11-29 DIAGNOSIS — Z96642 Presence of left artificial hip joint: Secondary | ICD-10-CM | POA: Diagnosis present

## 2016-11-29 DIAGNOSIS — Z905 Acquired absence of kidney: Secondary | ICD-10-CM

## 2016-11-29 DIAGNOSIS — Z7901 Long term (current) use of anticoagulants: Secondary | ICD-10-CM

## 2016-11-29 DIAGNOSIS — I110 Hypertensive heart disease with heart failure: Secondary | ICD-10-CM | POA: Diagnosis present

## 2016-11-29 DIAGNOSIS — Z8249 Family history of ischemic heart disease and other diseases of the circulatory system: Secondary | ICD-10-CM

## 2016-11-29 DIAGNOSIS — Z823 Family history of stroke: Secondary | ICD-10-CM

## 2016-11-29 DIAGNOSIS — Z952 Presence of prosthetic heart valve: Secondary | ICD-10-CM

## 2016-11-29 DIAGNOSIS — I2510 Atherosclerotic heart disease of native coronary artery without angina pectoris: Secondary | ICD-10-CM | POA: Diagnosis present

## 2016-11-29 LAB — CBC WITH DIFFERENTIAL/PLATELET
BASOS PCT: 0 %
Basophils Absolute: 0 10*3/uL (ref 0.0–0.1)
EOS ABS: 0.1 10*3/uL (ref 0.0–0.7)
Eosinophils Relative: 1 %
HCT: 34.2 % — ABNORMAL LOW (ref 36.0–46.0)
HEMOGLOBIN: 11.5 g/dL — AB (ref 12.0–15.0)
LYMPHS ABS: 1.8 10*3/uL (ref 0.7–4.0)
Lymphocytes Relative: 19 %
MCH: 30.2 pg (ref 26.0–34.0)
MCHC: 33.6 g/dL (ref 30.0–36.0)
MCV: 89.8 fL (ref 78.0–100.0)
Monocytes Absolute: 0.9 10*3/uL (ref 0.1–1.0)
Monocytes Relative: 9 %
NEUTROS ABS: 6.6 10*3/uL (ref 1.7–7.7)
NEUTROS PCT: 71 %
Platelets: 174 10*3/uL (ref 150–400)
RBC: 3.81 MIL/uL — ABNORMAL LOW (ref 3.87–5.11)
RDW: 14.8 % (ref 11.5–15.5)
WBC: 9.4 10*3/uL (ref 4.0–10.5)

## 2016-11-29 LAB — COMPREHENSIVE METABOLIC PANEL
ALBUMIN: 3.1 g/dL — AB (ref 3.5–5.0)
ALK PHOS: 104 U/L (ref 38–126)
ALT: 13 U/L — ABNORMAL LOW (ref 14–54)
AST: 27 U/L (ref 15–41)
Anion gap: 8 (ref 5–15)
BUN: 20 mg/dL (ref 6–20)
CALCIUM: 8.7 mg/dL — AB (ref 8.9–10.3)
CO2: 27 mmol/L (ref 22–32)
CREATININE: 1.06 mg/dL — AB (ref 0.44–1.00)
Chloride: 104 mmol/L (ref 101–111)
GFR calc Af Amer: 52 mL/min — ABNORMAL LOW (ref >60)
GFR calc non Af Amer: 44 mL/min — ABNORMAL LOW (ref >60)
GLUCOSE: 111 mg/dL — AB (ref 65–99)
Potassium: 3.5 mmol/L (ref 3.5–5.1)
SODIUM: 139 mmol/L (ref 135–145)
Total Bilirubin: 1 mg/dL (ref 0.3–1.2)
Total Protein: 6.9 g/dL (ref 6.5–8.1)

## 2016-11-29 LAB — PROTIME-INR
INR: 3.98
Prothrombin Time: 39.9 seconds — ABNORMAL HIGH (ref 11.4–15.2)

## 2016-11-29 LAB — I-STAT CG4 LACTIC ACID, ED: Lactic Acid, Venous: 1.22 mmol/L (ref 0.5–1.9)

## 2016-11-29 MED ORDER — RISAQUAD PO CAPS
1.0000 | ORAL_CAPSULE | ORAL | Status: DC
  Administered 2016-11-30 – 2016-12-04 (×3): 1 via ORAL
  Filled 2016-11-29 (×3): qty 1

## 2016-11-29 MED ORDER — DEXTROSE 5 % IV SOLN
1.0000 g | Freq: Every day | INTRAVENOUS | Status: DC
Start: 2016-11-29 — End: 2016-11-29

## 2016-11-29 MED ORDER — LISINOPRIL 2.5 MG PO TABS
2.5000 mg | ORAL_TABLET | Freq: Every day | ORAL | Status: DC
  Administered 2016-11-30 – 2016-12-05 (×6): 2.5 mg via ORAL
  Filled 2016-11-29 (×6): qty 1

## 2016-11-29 MED ORDER — SODIUM CHLORIDE 0.9 % IV SOLN
250.0000 mL | INTRAVENOUS | Status: DC | PRN
  Administered 2016-11-30 – 2016-12-03 (×3): 250 mL via INTRAVENOUS

## 2016-11-29 MED ORDER — ZINC OXIDE 40 % EX OINT
TOPICAL_OINTMENT | Freq: Two times a day (BID) | CUTANEOUS | Status: DC
  Administered 2016-11-30: 10:00:00 via TOPICAL
  Administered 2016-12-01: 57 via TOPICAL
  Administered 2016-12-04: 22:00:00 via TOPICAL
  Filled 2016-11-29: qty 57

## 2016-11-29 MED ORDER — VITAMIN D 1000 UNITS PO TABS
2000.0000 [IU] | ORAL_TABLET | Freq: Every evening | ORAL | Status: DC
  Administered 2016-11-30 – 2016-12-04 (×5): 2000 [IU] via ORAL
  Filled 2016-11-29 (×5): qty 2

## 2016-11-29 MED ORDER — SENNOSIDES-DOCUSATE SODIUM 8.6-50 MG PO TABS: 1.0000 | ORAL_TABLET | Freq: Every evening | ORAL | Status: DC | PRN

## 2016-11-29 MED ORDER — SODIUM CHLORIDE 0.9% FLUSH
3.0000 mL | Freq: Two times a day (BID) | INTRAVENOUS | Status: DC
  Administered 2016-12-04 – 2016-12-05 (×2): 3 mL via INTRAVENOUS

## 2016-11-29 MED ORDER — BISACODYL 5 MG PO TBEC: 5.0000 mg | DELAYED_RELEASE_TABLET | Freq: Every day | ORAL | Status: DC | PRN

## 2016-11-29 MED ORDER — POTASSIUM CHLORIDE CRYS ER 20 MEQ PO TBCR
20.0000 meq | EXTENDED_RELEASE_TABLET | Freq: Every day | ORAL | Status: DC
  Administered 2016-11-30 – 2016-12-05 (×5): 20 meq via ORAL
  Filled 2016-11-29 (×5): qty 1

## 2016-11-29 MED ORDER — ONDANSETRON HCL 4 MG PO TABS
4.0000 mg | ORAL_TABLET | Freq: Four times a day (QID) | ORAL | Status: DC | PRN
Start: 2016-11-29 — End: 2016-12-05

## 2016-11-29 MED ORDER — ACETAMINOPHEN 325 MG PO TABS
650.0000 mg | ORAL_TABLET | Freq: Four times a day (QID) | ORAL | Status: DC | PRN
  Administered 2016-12-02 – 2016-12-03 (×2): 650 mg via ORAL
  Filled 2016-11-29 (×2): qty 2

## 2016-11-29 MED ORDER — ONDANSETRON HCL 4 MG/2ML IJ SOLN: 4.0000 mg | Freq: Four times a day (QID) | INTRAMUSCULAR | Status: DC | PRN

## 2016-11-29 MED ORDER — HYDROCODONE-ACETAMINOPHEN 5-325 MG PO TABS: 1.0000 | ORAL_TABLET | ORAL | Status: DC | PRN

## 2016-11-29 MED ORDER — NIACINAMIDE 500 MG PO TABS
500.0000 mg | ORAL_TABLET | Freq: Two times a day (BID) | ORAL | Status: DC
  Administered 2016-11-30: 500 mg via ORAL

## 2016-11-29 MED ORDER — SODIUM CHLORIDE 0.9% FLUSH: 3.0000 mL | INTRAVENOUS | Status: DC | PRN

## 2016-11-29 MED ORDER — CARVEDILOL 6.25 MG PO TABS
6.2500 mg | ORAL_TABLET | Freq: Two times a day (BID) | ORAL | Status: DC
  Administered 2016-11-30 – 2016-12-05 (×11): 6.25 mg via ORAL
  Filled 2016-11-29 (×10): qty 1

## 2016-11-29 MED ORDER — FUROSEMIDE 40 MG PO TABS
40.0000 mg | ORAL_TABLET | ORAL | Status: DC
  Administered 2016-12-01 – 2016-12-04 (×2): 40 mg via ORAL
  Filled 2016-11-29 (×2): qty 1

## 2016-11-29 MED ORDER — ADULT MULTIVITAMIN W/MINERALS CH
1.0000 | ORAL_TABLET | Freq: Every evening | ORAL | Status: DC
  Administered 2016-11-30 – 2016-12-04 (×5): 1 via ORAL
  Filled 2016-11-29 (×4): qty 1

## 2016-11-29 MED ORDER — LATANOPROST 0.005 % OP SOLN
1.0000 [drp] | Freq: Every day | OPHTHALMIC | Status: DC
  Administered 2016-12-01 – 2016-12-04 (×4): 1 [drp] via OPHTHALMIC
  Filled 2016-11-29 (×3): qty 2.5

## 2016-11-29 MED ORDER — SODIUM CHLORIDE 0.9 % IV BOLUS (SEPSIS)
500.0000 mL | Freq: Once | INTRAVENOUS | Status: AC
  Administered 2016-11-29: 500 mL via INTRAVENOUS

## 2016-11-29 MED ORDER — PIPERACILLIN-TAZOBACTAM 3.375 G IVPB 30 MIN
3.3750 g | Freq: Once | INTRAVENOUS | Status: AC
  Administered 2016-11-29: 3.375 g via INTRAVENOUS
  Filled 2016-11-29: qty 50

## 2016-11-29 MED ORDER — LEVOTHYROXINE SODIUM 50 MCG PO TABS
50.0000 ug | ORAL_TABLET | Freq: Every day | ORAL | Status: DC
  Administered 2016-11-30 – 2016-12-05 (×6): 50 ug via ORAL
  Filled 2016-11-29 (×7): qty 1

## 2016-11-29 MED ORDER — ACETAMINOPHEN 650 MG RE SUPP: 650.0000 mg | Freq: Four times a day (QID) | RECTAL | Status: DC | PRN

## 2016-11-29 NOTE — ED Triage Notes (Signed)
Per EMS, pt is coming from home with complaints of bilateral swelling in lower extremities and left hip pain. Pt has a hx of bullous pemphigoid. Family reports that pt has a fever of 102 last night and they administered tylenol. Pt had 1000 mg tylenol at approx. 3p today. Pt has a hx of a-fib, HTN, hip replacement, and mitral valve replacement. Pt is AO x4 and ambulates with a walker.

## 2016-11-29 NOTE — ED Provider Notes (Signed)
[facility] DEPT Provider Note   CSN: [id number] Arrival date & time: 11/29/16  1713     History   Chief Complaint Chief Complaint  Patient presents with  . Leg Swelling    HPI Amanda Richardson is a 83 y.o. female.  HPI Pt has history of chronic leg redness and infection associated with pemphigus causing recurrent blisters resulting in skin infections.  This has been off and on for the last years.   Pt has a home health that treats and wraps the leg.  Family states they noticed the redness looks acutely worse in the last day or two.  Pt had a fever last night up to 101.  She was also feeling weak and her leg was hurting more.   Past Medical History:  Diagnosis Date  . Allergic rhinitis   . Anemia    not applicable since PYK-99I when pt had hysterectomy   . Basal cell carcinoma   . Bilateral lower extremity edema    history of venous insufficiency  . Bullous pemphigoid   . Chronic anticoagulation   . Chronic atrial fibrillation ([place])   . Chronic diastolic CHF (congestive heart failure) ([place])   . Coronary artery disease    a. CABG 2001 at time of MVR.  Marland Kitchen DVT (deep venous thrombosis) ([place])    possible in 1990s and does not remember being on blood thinner  . Glaucoma   . H/O mitral valve replacement 2001   mechanical heart valve; family states indication may have been MR from MVP or  rheumatic heart disease  . Hypertension   . Hypothyroidism   . Ischemic bowel disease ([place])    missing 2/3 of bowel  . S/p nephrectomy    left  . Stroke [facility])    a. CT head 2018 showed old left anterior watershed infarct, old right caudate lacunar infarct.  . Thrombocytopenia ([place])   . Urinary incontinence     Patient Active Problem List   Diagnosis Date Noted  . Pressure injury of skin 09/22/2016  . Acute metabolic encephalopathy 33/82/5053  . Sepsis ([place]) 09/21/2016  . Bilateral lower leg cellulitis 09/21/2016  . Permanent atrial fibrillation ([place])   . Fall   . Chronic diastolic  CHF (congestive heart failure) ([place]) 07/16/2016  . Anemia 07/16/2016  . Thrombocytopenia ([place]) 07/16/2016  . Chronic venous insufficiency 07/16/2016  . Hematoma of left hip 07/13/2016  . H/O mechanical mitral valve replacement 2001   . Hypertension   . S/p nephrectomy   . H/O coronary artery bypass surgery   . Chronic atrial fibrillation ([place])   . Hypothyroidism   . Bullous pemphigoid   . Chronic anticoagulation     Past Surgical History:  Procedure Laterality Date  . ABDOMINAL HYSTERECTOMY  1960s  . APPENDECTOMY  1960s  . CORONARY ARTERY BYPASS GRAFT  01/2000  . HERNIA REPAIR    . HIP ARTHROPLASTY Left 12/18/2015   Procedure: HEMIARTHROPLASTY  HIP;  Surgeon: Tania Ade, MD;  Location: [facility];  Service: Orthopedics;  Laterality: Left;  . hx broken collarbone    . LAPAROSCOPIC SMALL BOWEL RESECTION  2004 or 5   adhesions and ischemic bowel  . MITRAL VALVE REPLACEMENT  01/2000   mechanical heart valve  . TONSILLECTOMY  1933  . TRANSTHORACIC ECHOCARDIOGRAM  02/2012   EF 60-65%, mild LVH; mildly thickened AV leaflets with sclerosis and moderate regurg; mechanical MV prosthesis; LA dilated; RVSP increased; RA severely dilated; increased LA pressure; moderate TR    OB  History    No data available       Home Medications    Prior to Admission medications   Medication Sig Start Date End Date Taking? Authorizing Provider  acetaminophen (TYLENOL) 325 MG tablet Take 2 tablets (650 mg total) by mouth every 6 (six) hours as needed for mild pain (or Fever >/= 101). 07/25/16   Theodis Blaze, MD  calcium-vitamin D (OSCAL WITH D) 500-200 MG-UNIT per tablet Take 1 tablet by mouth every evening.     [provider]  carvedilol (COREG) 6.25 MG tablet Take 1 tablet (6.25 mg total) by mouth 2 (two) times daily. 09/22/16 09/22/17  Florencia Reasons, MD  Cholecalciferol 2000 units CAPS Take 1 capsule by mouth every evening.    [provider]  clobetasol ointment (TEMOVATE) 7.02 % Apply  1 application topically 2 (two) times daily as needed (blisters for bulous pemphigoid).     [provider]  furosemide (LASIX) 40 MG tablet Take 1 tablet (40 mg total) by mouth every Monday, Wednesday, and Friday. May change to daily if bilateral lower legs start to have fluids retention. 09/22/16   Florencia Reasons, MD  KLOR-CON M20 20 MEQ tablet Take 1 tablet by mouth daily.  08/22/14   [provider]  latanoprost (XALATAN) 0.005 % ophthalmic solution Place 1 drop into both eyes at bedtime.     [provider]  levothyroxine (SYNTHROID, LEVOTHROID) 50 MCG tablet Take 50 mcg by mouth daily. 05/05/14   [provider]  lisinopril (ZESTRIL) 2.5 MG tablet Take 1 tablet (2.5 mg total) by mouth daily. 09/22/16 09/22/17  Florencia Reasons, MD  liver oil-zinc oxide (DESITIN) 40 % ointment Apply topically 2 (two) times daily. 09/22/16   Florencia Reasons, MD  Multiple Vitamin (MULTIVITAMIN WITH MINERALS) TABS Take 1 tablet by mouth every evening.     [provider]  niacinamide 500 MG tablet Take 500 mg by mouth 2 (two) times daily with a meal.    [provider]  PRESCRIPTION MEDICATION Apply 1 each topically as needed (wound care).    [provider]  Probiotic Product (PROBIOTIC PO) Take 1 capsule by mouth every other day.    [provider]  warfarin (COUMADIN) 5 MG tablet Take 1 tablet (5 mg total) by mouth daily at 6 PM. Please check INR on 4/4 and readjust the dose of Coumadin to maintain INR 2.5 - 3.5 07/25/16   Theodis Blaze, MD    Family History Family History  Problem Relation Age of Onset  . Irregular heart beat Mother   . Other Mother        pellegra  . Heart failure Mother        had very swollen legs??  . Stroke Mother   . Prostate cancer Father        also skin cancer  . Heart attack Daughter   . Heart disease Brother   . Heart attack Brother 72       still alive    Social History Social History  Substance Use Topics  . Smoking status:  Never Smoker  . Smokeless tobacco: Never Used  . Alcohol use No     Allergies   Heparin and Codeine   Review of Systems Review of Systems  Constitutional: Positive for fatigue and fever.  Gastrointestinal: Negative for vomiting.       Poor appetite  All other systems reviewed and are negative.    Physical Exam Updated Vital Signs BP  140/71 (BP Location: Right Arm)   Pulse 79   Temp 98 F (36.7 C) (Oral)   Resp 14   SpO2 99%   Physical Exam  Constitutional: She appears well-developed and well-nourished. No distress.  HENT:  Head: Normocephalic and atraumatic.  Right Ear: External ear normal.  Left Ear: External ear normal.  Eyes: Conjunctivae are normal. Right eye exhibits no discharge. Left eye exhibits no discharge. No scleral icterus.  Neck: Neck supple. No tracheal deviation present.  Cardiovascular: Normal rate, regular rhythm and intact distal pulses.   Pulmonary/Chest: Effort normal and breath sounds normal. No stridor. No respiratory distress. She has no wheezes. She has no rales.  Abdominal: Soft. Bowel sounds are normal. She exhibits no distension. There is no tenderness. There is no rebound and no guarding.  Musculoskeletal: She exhibits edema and tenderness.  Erythema of the right lower extrem, few scabs, edema, no purulent drainage, erythema extends up to the thigh  Neurological: She is alert. She has normal strength. No cranial nerve deficit (no facial droop, extraocular movements intact, no slurred speech) or sensory deficit. She exhibits normal muscle tone. She displays no seizure activity. Coordination normal.  Skin: Skin is warm and dry. No rash noted.  Psychiatric: She has a normal mood and affect.  Nursing note and vitals reviewed.    ED Treatments / Results  Labs (all labs ordered are listed, but only abnormal results are displayed) Labs Reviewed  CBC WITH DIFFERENTIAL/PLATELET - Abnormal; Notable for the following:       Result Value   RBC  3.81 (*)    Hemoglobin 11.5 (*)    HCT 34.2 (*)    All other components within normal limits  COMPREHENSIVE METABOLIC PANEL - Abnormal; Notable for the following:    Glucose, Bld 111 (*)    Creatinine, Ser 1.06 (*)    Calcium 8.7 (*)    Albumin 3.1 (*)    ALT 13 (*)    GFR calc non Af Amer 44 (*)    GFR calc Af Amer 52 (*)    All other components within normal limits  PROTIME-INR  I-STAT CG4 LACTIC ACID, ED  I-STAT CG4 LACTIC ACID, ED     Procedures Procedures (including critical care time)  Medications Ordered in ED Medications  sodium chloride 0.9 % bolus 500 mL (0 mLs Intravenous Stopped 11/29/16 1941)  piperacillin-tazobactam (ZOSYN) IVPB 3.375 g (0 g Intravenous Stopped 11/29/16 1941)     Initial Impression / Assessment and Plan / ED Course  I have reviewed the triage vital signs and the nursing notes.  Pertinent labs & imaging results that were available during my care of the patient were reviewed by me and considered in my medical decision making (see chart for details).  Clinical Course as of Nov 29 2209  Wed Nov 29, 2016  2056 Lactic acid is 1.22  [JK]    Clinical Course User Index [JK] Dorie Rank, MD    I had a long discussion with the family. The patient presents to the emergency room with recurrent cellulitis associated with her chronic leg wounds. Her laboratory tests are reassuring. She is afebrile. She does not have an elevated lactic acid level. She however does have signs of lymphangitic streaking up towards her thigh. I discussed the options of inpatient versus outpatient treatment. Understandably, they're concerned about her age and her weakness. I certainly think it's reasonable to bring her in overnight for IV antibiotics. I will consult with the medical service.  Final Clinical Impressions(s) / ED Diagnoses   Final diagnoses:  Cellulitis of left lower extremity      Dorie Rank, MD 11/29/16 2211

## 2016-11-29 NOTE — Progress Notes (Signed)
ANTICOAGULATION CONSULT NOTE - Initial Consult  Pharmacy Consult for Warfarin Indication: atrial fibrillation  Allergies  Allergen Reactions  . Heparin Anaphylaxis  . Codeine Other (See Comments)    Unknown         Vital Signs: Temp: 98 F (36.7 C) (08/08 1720) Temp Source: Oral (08/08 1720) BP: 140/71 (08/08 2023) Pulse Rate: 79 (08/08 2023)  Labs:  Recent Labs  11/29/16 1818  HGB 11.5*  HCT 34.2*  PLT 174  CREATININE 1.06*    CrCl cannot be calculated (Unknown ideal weight.).   Medical History: Past Medical History:  Diagnosis Date  . Allergic rhinitis   . Anemia    not applicable since TUU-82C when pt had hysterectomy   . Basal cell carcinoma   . Bilateral lower extremity edema    history of venous insufficiency  . Bullous pemphigoid   . Chronic anticoagulation   . Chronic atrial fibrillation ([place])   . Chronic diastolic CHF (congestive heart failure) ([place])   . Coronary artery disease    a. CABG 2001 at time of MVR.  Marland Kitchen DVT (deep venous thrombosis) ([place])    possible in 1990s and does not remember being on blood thinner  . Glaucoma   . H/O mitral valve replacement 2001   mechanical heart valve; family states indication may have been MR from MVP or  rheumatic heart disease  . Hypertension   . Hypothyroidism   . Ischemic bowel disease ([place])    missing 2/3 of bowel  . S/p nephrectomy    left  . Stroke [facility])    a. CT head 2018 showed old left anterior watershed infarct, old right caudate lacunar infarct.  . Thrombocytopenia ([place])   . Urinary incontinence     Medications:  Scheduled:  . carvedilol  6.25 mg Oral BID  . [START ON 11/30/2016] Cholecalciferol  1 capsule Oral QPM  . [START ON 12/01/2016] furosemide  40 mg Oral Q M,W,F  . latanoprost  1 drop Both Eyes QHS  . [START ON 11/30/2016] levothyroxine  50 mcg Oral Daily  . [START ON 11/30/2016] lisinopril  2.5 mg Oral Daily  . liver oil-zinc oxide   Topical BID  . [START ON 11/30/2016] multivitamin  with minerals  1 tablet Oral QPM  . [START ON 11/30/2016] niacinamide  500 mg Oral BID WC  . [START ON 11/30/2016] potassium chloride SA  20 mEq Oral Daily  . [START ON 11/30/2016] Probiotic  1 capsule Oral QODAY  . sodium chloride flush  3 mL Intravenous Q12H   Infusions:  . sodium chloride      Assessment: 14 yoF presents with chronic leg redness and infection on chronic warfarin for A-fib. HD 3 mg daily unsure of LD. INR=3.98 on admission.  Goal of Therapy:  INR=2.5-3.5    Plan:  Daily PT/INR No coumadin tonight  Dorrene German 11/29/2016,10:42 PM

## 2016-11-29 NOTE — H&P (Signed)
History and Physical    Amanda Richardson YHC:[id number] DOB: 10/02/1923 DOA: 11/29/2016  PCP: Merrilee Seashore, MD   Patient coming from: Home  Chief Complaint: Increasing leg redness and pain, fever   HPI: Amanda Richardson is a 83 y.o. female with medical histoy significant for chronic diastolic CHF, chronic atrial fibrillation on warfarin, and bullous pemphigoid, now presenting to the emergency department for evaluation of increased redness and tenderness to the lower extremities, as well as a fever last night. Patient is accompanied by family who assist with the history. She reportedly been in her usual state, receiving care from a home health nurse for chronic bilateral lower extremity wounds that are attributed to bolus pemphigoid. She has had a secondary cellulitis of the lower extremities previously and has been septic due to this in the past. Home health nurse noted worsening and redness and edema in the lower extremities over the past couple days and red streaking proximally was noted today. The patient reportedly had a fever to 101F last night. She has recently been on oral doxycycline.  ED Course: Upon arrival to the ED, patient is found to be afebrile, saturating well on room air, and with vitals stable. Chemistry panels unremarkable and CBC is notable for a stable normocytic anemia with hemoglobin of 11.5. Lactic acid is reassuring at 1.22. Patient was treated with empiric Zosyn in the ED. She remained hemodynamically stable, and no appreciable respiratory distress, and will be observed on the medical-surgical unit for ongoing evaluation and management of lower extremity cellulitis with fever at home.  Review of Systems:  All other systems reviewed and apart from HPI, are negative.  Past Medical History:  Diagnosis Date  . Allergic rhinitis   . Anemia    not applicable since [id number]-61Y when pt had hysterectomy   . Basal cell carcinoma   . Bilateral lower extremity edema    history of venous insufficiency  . Bullous pemphigoid   . Chronic anticoagulation   . Chronic atrial fibrillation ([place])   . Chronic diastolic CHF (congestive heart failure) ([place])   . Coronary artery disease    a. CABG 2001 at time of MVR.  Marland Kitchen DVT (deep venous thrombosis) ([place])    possible in 1990s and does not remember being on blood thinner  . Glaucoma   . H/O mitral valve replacement 2001   mechanical heart valve; family states indication may have been MR from MVP or  rheumatic heart disease  . Hypertension   . Hypothyroidism   . Ischemic bowel disease ([place])    missing 2/3 of bowel  . S/p nephrectomy    left  . Stroke [facility])    a. CT head 2018 showed old left anterior watershed infarct, old right caudate lacunar infarct.  . Thrombocytopenia ([place])   . Urinary incontinence     Past Surgical History:  Procedure Laterality Date  . ABDOMINAL HYSTERECTOMY  1960s  . APPENDECTOMY  1960s  . CORONARY ARTERY BYPASS GRAFT  01/2000  . HERNIA REPAIR    . HIP ARTHROPLASTY Left 12/18/2015   Procedure: HEMIARTHROPLASTY  HIP;  Surgeon: Tania Ade, MD;  Location: [facility];  Service: Orthopedics;  Laterality: Left;  . hx broken collarbone    . LAPAROSCOPIC SMALL BOWEL RESECTION  2004 or 5   adhesions and ischemic bowel  . MITRAL VALVE REPLACEMENT  01/2000   mechanical heart valve  . TONSILLECTOMY  1933  . TRANSTHORACIC ECHOCARDIOGRAM  02/2012   EF 60-65%, mild LVH; mildly thickened AV  leaflets with sclerosis and moderate regurg; mechanical MV prosthesis; LA dilated; RVSP increased; RA severely dilated; increased LA pressure; moderate TR     reports that she has never smoked. She has never used smokeless tobacco. She reports that she does not drink alcohol or use drugs.  Allergies  Allergen Reactions  . Heparin Anaphylaxis  . Codeine Other (See Comments)    Unknown     Family History  Problem Relation Age of Onset  . Irregular heart beat Mother   . Other Mother        pellegra  .  Heart failure Mother        had very swollen legs??  . Stroke Mother   . Prostate cancer Father        also skin cancer  . Heart attack Daughter   . Heart disease Brother   . Heart attack Brother 33       still alive     Prior to Admission medications   Medication Sig Start Date End Date Taking? Authorizing Provider  acetaminophen (TYLENOL) 325 MG tablet Take 2 tablets (650 mg total) by mouth every 6 (six) hours as needed for mild pain (or Fever >/= 101). 07/25/16   Theodis Blaze, MD  calcium-vitamin D (OSCAL WITH D) 500-200 MG-UNIT per tablet Take 1 tablet by mouth every evening.     [provider]  carvedilol (COREG) 6.25 MG tablet Take 1 tablet (6.25 mg total) by mouth 2 (two) times daily. 09/22/16 09/22/17  Florencia Reasons, MD  Cholecalciferol 2000 units CAPS Take 1 capsule by mouth every evening.    [provider]  clobetasol ointment (TEMOVATE) 8.25 % Apply 1 application topically 2 (two) times daily as needed (blisters for bulous pemphigoid).     [provider]  furosemide (LASIX) 40 MG tablet Take 1 tablet (40 mg total) by mouth every Monday, Wednesday, and Friday. May change to daily if bilateral lower legs start to have fluids retention. 09/22/16   Florencia Reasons, MD  KLOR-CON M20 20 MEQ tablet Take 1 tablet by mouth daily.  08/22/14   [provider]  latanoprost (XALATAN) 0.005 % ophthalmic solution Place 1 drop into both eyes at bedtime.     [provider]  levothyroxine (SYNTHROID, LEVOTHROID) 50 MCG tablet Take 50 mcg by mouth daily. 05/05/14   [provider]  lisinopril (ZESTRIL) 2.5 MG tablet Take 1 tablet (2.5 mg total) by mouth daily. 09/22/16 09/22/17  Florencia Reasons, MD  liver oil-zinc oxide (DESITIN) 40 % ointment Apply topically 2 (two) times daily. 09/22/16   Florencia Reasons, MD  Multiple Vitamin (MULTIVITAMIN WITH MINERALS) TABS Take 1 tablet by mouth every evening.     [provider]  niacinamide 500 MG tablet Take 500 mg by mouth 2  (two) times daily with a meal.    [provider]  PRESCRIPTION MEDICATION Apply 1 each topically as needed (wound care).    [provider]  Probiotic Product (PROBIOTIC PO) Take 1 capsule by mouth every other day.    [provider]  warfarin (COUMADIN) 5 MG tablet Take 1 tablet (5 mg total) by mouth daily at 6 PM. Please check INR on 4/4 and readjust the dose of Coumadin to maintain INR 2.5 - 3.5 07/25/16   Theodis Blaze, MD    Physical Exam: Vitals:   11/29/16 1720 11/29/16 2023  BP: 125/60 140/71  Pulse: 73 79  Resp: 16 14  Temp: 98 F (36.7 C)  TempSrc: Oral   SpO2: 98% 99%      Constitutional: NAD, calm, in apparent discomfort Eyes: PERTLA, lids and conjunctivae normal ENMT: Mucous membranes are moist. Posterior pharynx clear of any exudate or lesions.   Neck: normal, supple, no masses, no thyromegaly Respiratory: clear to auscultation bilaterally, no wheezing, no crackles. Normal respiratory effort.    Cardiovascular: Rate ~80 and irregular. No significant JVD. Abdomen: No distension, no tenderness, no masses palpated. Bowel sounds normal.  Musculoskeletal: no clubbing / cyanosis. No joint deformity upper and lower extremities.   Skin: Edema, blistering, ulcerations to lower LLE from ankle to knee. Skin is otherwise warm, dry, well-perfused.  Neurologic: CN 2-12 grossly intact. Sensation intact, DTR normal. Strength 5/5 in all 4 limbs.  Psychiatric: Alert and oriented x 3. Pleasant, cooperative.     Labs on Admission: I have personally reviewed following labs and imaging studies  CBC:  Recent Labs Lab 11/29/16 1818  WBC 9.4  NEUTROABS 6.6  HGB 11.5*  HCT 34.2*  MCV 89.8  PLT 440   Basic Metabolic Panel:  Recent Labs Lab 11/29/16 1818  NA 139  K 3.5  CL 104  CO2 27  GLUCOSE 111*  BUN 20  CREATININE 1.06*  CALCIUM 8.7*   GFR: CrCl cannot be calculated (Unknown ideal weight.). Liver Function Tests:  Recent Labs Lab  11/29/16 1818  AST 27  ALT 13*  ALKPHOS 104  BILITOT 1.0  PROT 6.9  ALBUMIN 3.1*   No results for input(s): LIPASE, AMYLASE in the last 168 hours. No results for input(s): AMMONIA in the last 168 hours. Coagulation Profile: No results for input(s): INR, PROTIME in the last 168 hours. Cardiac Enzymes: No results for input(s): CKTOTAL, CKMB, CKMBINDEX, TROPONINI in the last 168 hours. BNP (last 3 results) No results for input(s): PROBNP in the last 8760 hours. HbA1C: No results for input(s): HGBA1C in the last 72 hours. CBG: No results for input(s): GLUCAP in the last 168 hours. Lipid Profile: No results for input(s): CHOL, HDL, LDLCALC, TRIG, CHOLHDL, LDLDIRECT in the last 72 hours. Thyroid Function Tests: No results for input(s): TSH, T4TOTAL, FREET4, T3FREE, THYROIDAB in the last 72 hours. Anemia Panel: No results for input(s): VITAMINB12, FOLATE, FERRITIN, TIBC, IRON, RETICCTPCT in the last 72 hours. Urine analysis:    Component Value Date/Time   COLORURINE YELLOW 09/21/2016 0638   APPEARANCEUR CLEAR 09/21/2016 0638   LABSPEC 1.016 09/21/2016 0638   PHURINE 5.0 09/21/2016 0638   GLUCOSEU NEGATIVE 09/21/2016 0638   HGBUR MODERATE (A) 09/21/2016 0638   BILIRUBINUR NEGATIVE 09/21/2016 0638   KETONESUR NEGATIVE 09/21/2016 0638   PROTEINUR 100 (A) 09/21/2016 0638   UROBILINOGEN 4.0 (H) 08/04/2014 1608   NITRITE NEGATIVE 09/21/2016 0638   LEUKOCYTESUR NEGATIVE 09/21/2016 0638   Sepsis Labs: @LABRCNTIP (procalcitonin:4,lacticidven:4) )No results found for this or any previous visit (from the past 240 hour(s)).   Radiological Exams on Admission: No results found.  EKG: Not performed.   Assessment/Plan  1. LLE Cellulitis  - Pt presents with fevers/chills, and increased pain, swelling, and redness to lower left leg  - Afebrile here, no leukocytosis, and reassuring lactic acid, but reports fevers at home and appears to have intense cellulitis about the lower left leg and  has been septic from this before  - She was treated with empiric Zosyn in ED  - Plan to obtain blood cultures, continue empiric Zosyn given the blistering (though this is likely from the underlying pemphigoid), keep leg elevated, and consult with wound  care   2. Chronic atrial fibrillation  - Rate-controlled - CHADS-VASc is 66 (age x2, gender, CHF) - Continue warfarin with pharmacy to dose  - Continue beta-blocker   3. Chronic diastolic CHF  - Appears euvolemic on admission  - Continue Lasix 40 mg on MWF, Coreg, and lisinopril    4. Hypothyroidism  - Appears stable  - Continue Synthroid     DVT prophylaxis: warfarin Code Status: DNR Family Communication: Family updated at bedside Disposition Plan: Observe on med-surg Consults called: None Admission status: Observation    Vianne Bulls, MD [facility] Pager [phone]  If 7PM-7AM, please contact night-coverage [email or web address] Password [facility]  11/29/2016, 10:25 PM

## 2016-11-30 DIAGNOSIS — Z8249 Family history of ischemic heart disease and other diseases of the circulatory system: Secondary | ICD-10-CM | POA: Diagnosis not present

## 2016-11-30 DIAGNOSIS — I2510 Atherosclerotic heart disease of native coronary artery without angina pectoris: Secondary | ICD-10-CM | POA: Diagnosis present

## 2016-11-30 DIAGNOSIS — Z823 Family history of stroke: Secondary | ICD-10-CM | POA: Diagnosis not present

## 2016-11-30 DIAGNOSIS — Z952 Presence of prosthetic heart valve: Secondary | ICD-10-CM | POA: Diagnosis not present

## 2016-11-30 DIAGNOSIS — E039 Hypothyroidism, unspecified: Secondary | ICD-10-CM | POA: Diagnosis present

## 2016-11-30 DIAGNOSIS — I5032 Chronic diastolic (congestive) heart failure: Secondary | ICD-10-CM | POA: Diagnosis present

## 2016-11-30 DIAGNOSIS — Z90710 Acquired absence of both cervix and uterus: Secondary | ICD-10-CM | POA: Diagnosis not present

## 2016-11-30 DIAGNOSIS — Z85828 Personal history of other malignant neoplasm of skin: Secondary | ICD-10-CM | POA: Diagnosis not present

## 2016-11-30 DIAGNOSIS — L120 Bullous pemphigoid: Secondary | ICD-10-CM | POA: Diagnosis present

## 2016-11-30 DIAGNOSIS — I110 Hypertensive heart disease with heart failure: Secondary | ICD-10-CM | POA: Diagnosis present

## 2016-11-30 DIAGNOSIS — Z7901 Long term (current) use of anticoagulants: Secondary | ICD-10-CM | POA: Diagnosis not present

## 2016-11-30 DIAGNOSIS — L03116 Cellulitis of left lower limb: Secondary | ICD-10-CM | POA: Diagnosis present

## 2016-11-30 DIAGNOSIS — Z8673 Personal history of transient ischemic attack (TIA), and cerebral infarction without residual deficits: Secondary | ICD-10-CM | POA: Diagnosis not present

## 2016-11-30 DIAGNOSIS — D649 Anemia, unspecified: Secondary | ICD-10-CM | POA: Diagnosis present

## 2016-11-30 DIAGNOSIS — H409 Unspecified glaucoma: Secondary | ICD-10-CM | POA: Diagnosis present

## 2016-11-30 DIAGNOSIS — I10 Essential (primary) hypertension: Secondary | ICD-10-CM

## 2016-11-30 DIAGNOSIS — Z66 Do not resuscitate: Secondary | ICD-10-CM | POA: Diagnosis present

## 2016-11-30 DIAGNOSIS — I482 Chronic atrial fibrillation: Secondary | ICD-10-CM | POA: Diagnosis present

## 2016-11-30 DIAGNOSIS — Z951 Presence of aortocoronary bypass graft: Secondary | ICD-10-CM | POA: Diagnosis not present

## 2016-11-30 DIAGNOSIS — Z96642 Presence of left artificial hip joint: Secondary | ICD-10-CM | POA: Diagnosis present

## 2016-11-30 DIAGNOSIS — E876 Hypokalemia: Secondary | ICD-10-CM | POA: Diagnosis present

## 2016-11-30 DIAGNOSIS — Z905 Acquired absence of kidney: Secondary | ICD-10-CM | POA: Diagnosis not present

## 2016-11-30 LAB — CBC WITH DIFFERENTIAL/PLATELET
BASOS ABS: 0 10*3/uL (ref 0.0–0.1)
Basophils Relative: 0 %
EOS PCT: 1 %
Eosinophils Absolute: 0.1 10*3/uL (ref 0.0–0.7)
HCT: 32.9 % — ABNORMAL LOW (ref 36.0–46.0)
HEMOGLOBIN: 11.2 g/dL — AB (ref 12.0–15.0)
LYMPHS ABS: 1.9 10*3/uL (ref 0.7–4.0)
LYMPHS PCT: 24 %
MCH: 31.1 pg (ref 26.0–34.0)
MCHC: 34 g/dL (ref 30.0–36.0)
MCV: 91.4 fL (ref 78.0–100.0)
Monocytes Absolute: 0.8 10*3/uL (ref 0.1–1.0)
Monocytes Relative: 10 %
NEUTROS ABS: 5.3 10*3/uL (ref 1.7–7.7)
NEUTROS PCT: 65 %
PLATELETS: 158 10*3/uL (ref 150–400)
RBC: 3.6 MIL/uL — AB (ref 3.87–5.11)
RDW: 14.8 % (ref 11.5–15.5)
WBC: 8.2 10*3/uL (ref 4.0–10.5)

## 2016-11-30 LAB — BASIC METABOLIC PANEL
ANION GAP: 9 (ref 5–15)
BUN: 19 mg/dL (ref 6–20)
CHLORIDE: 106 mmol/L (ref 101–111)
CO2: 24 mmol/L (ref 22–32)
Calcium: 8.3 mg/dL — ABNORMAL LOW (ref 8.9–10.3)
Creatinine, Ser: 0.97 mg/dL (ref 0.44–1.00)
GFR calc Af Amer: 57 mL/min — ABNORMAL LOW (ref >60)
GFR, EST NON AFRICAN AMERICAN: 50 mL/min — AB (ref >60)
GLUCOSE: 113 mg/dL — AB (ref 65–99)
POTASSIUM: 3.3 mmol/L — AB (ref 3.5–5.1)
SODIUM: 139 mmol/L (ref 135–145)

## 2016-11-30 LAB — PROTIME-INR
INR: 3.99
PROTHROMBIN TIME: 39.9 s — AB (ref 11.4–15.2)

## 2016-11-30 MED ORDER — PIPERACILLIN-TAZOBACTAM 3.375 G IVPB
3.3750 g | Freq: Three times a day (TID) | INTRAVENOUS | Status: DC
  Administered 2016-11-30 – 2016-12-05 (×17): 3.375 g via INTRAVENOUS
  Filled 2016-11-30 (×12): qty 50

## 2016-11-30 NOTE — Consult Note (Signed)
[place] Nurse wound consult note Reason for Consult:LLE wounds, cellulitis, Bullous Pemphigoid Wound type:full thickness, partial thickness Pressure Injury POA: NA Measurement:entire left lower leg reddened with cellulitis, has multiple wounds, two full thickness with black scabbed areas, multiple partial thickness with pink wound beds ranging from 3cm x 3cm  to 6cm x 8cm with multiple 1.5cm serous filled intact blisters. Scant serosanguinous drainage, no odor noted. Wound bed:see above Drainage (amount, consistency, odor) see above Periwound:erythema Dressing procedure/placement/frequency: I have provided nurses with orders for Cleanse LLE with NS, pat gently dry, cover wounds with two 5x9 inch Xeroform, wrap with kerlix, adhere kerlix to kerlix with tape, NO TAPE ON SKIN. Perform daily. We will not follow, but will remain available to this patient, to nursing, and the medical and/or surgical teams.  Please re-consult if we need to assist further.   Fara Olden, RN-C, WTA-C, [facility] Wound Treatment Associate

## 2016-11-30 NOTE — Progress Notes (Signed)
[place] for Warfarin Indication: atrial fibrillation  Allergies  Allergen Reactions  . Heparin Anaphylaxis  . Codeine Other (See Comments)    Unknown      Height: 5\' 4"  (162.6 cm) Weight: 121 lb 0.5 oz (54.9 kg) IBW/kg (Calculated) : 54.7  Vital Signs: Temp: 98.3 F (36.8 C) (08/09 0417) Temp Source: Oral (08/09 0417) BP: 134/65 (08/09 0417) Pulse Rate: 76 (08/09 0417)  Labs:  Recent Labs  11/29/16 1818 11/29/16 2157 11/30/16 0119  HGB 11.5*  --  11.2*  HCT 34.2*  --  32.9*  PLT 174  --  158  LABPROT  --  39.9* 39.9*  INR  --  3.98 3.99  CREATININE 1.06*  --  0.97    Estimated Creatinine Clearance: 32.6 mL/min (by C-G formula based on SCr of 0.97 mg/dL).   Medical History: Past Medical History:  Diagnosis Date  . Allergic rhinitis   . Anemia    not applicable since CHY-85O when pt had hysterectomy   . Basal cell carcinoma   . Bilateral lower extremity edema    history of venous insufficiency  . Bullous pemphigoid   . Chronic anticoagulation   . Chronic atrial fibrillation ([place])   . Chronic diastolic CHF (congestive heart failure) ([place])   . Coronary artery disease    a. CABG 2001 at time of MVR.  Marland Kitchen DVT (deep venous thrombosis) ([place])    possible in 1990s and does not remember being on blood thinner  . Glaucoma   . H/O mitral valve replacement 2001   mechanical heart valve; family states indication may have been MR from MVP or  rheumatic heart disease  . Hypertension   . Hypothyroidism   . Ischemic bowel disease ([place])    missing 2/3 of bowel  . S/p nephrectomy    left  . Stroke [facility])    a. CT head 2018 showed old left anterior watershed infarct, old right caudate lacunar infarct.  . Thrombocytopenia ([place])   . Urinary incontinence     Medications:  Scheduled:  . acidophilus  1 capsule Oral QODAY  . carvedilol  6.25 mg Oral BID  . cholecalciferol  2,000 Units Oral QPM  . [START ON 12/01/2016] furosemide   40 mg Oral Q M,W,F  . latanoprost  1 drop Both Eyes QHS  . levothyroxine  50 mcg Oral Daily  . lisinopril  2.5 mg Oral Daily  . liver oil-zinc oxide   Topical BID  . multivitamin with minerals  1 tablet Oral QPM  . niacinamide  500 mg Oral BID WC  . potassium chloride SA  20 mEq Oral Daily  . sodium chloride flush  3 mL Intravenous Q12H   Infusions:  . sodium chloride 250 mL (11/30/16 0430)  . piperacillin-tazobactam (ZOSYN)  IV 3.375 g (11/30/16 0423)    Assessment: 18 yoF presents with chronic leg redness and infection on chronic warfarin for A-fib. HD 3 mg daily unsure of LD. INR=3.98 on admission.   11/30/2016 INR 3.99 supratherapeutic H/H slightly low but stable Plts WNL Scr WNL Diet ordered  Goal of Therapy:  INR=2.0-3.0   Plan:  Continue to hold warfarin today Daily PT/INR   Dolly Rias RPh 11/30/2016, 10:02 AM Pager [phone]

## 2016-11-30 NOTE — Evaluation (Signed)
Physical Therapy Evaluation Patient Details Name: Amanda Richardson MRN: [id number] DOB: 02/02/1924 Today's Date: 11/30/2016   History of Present Illness  Amanda Richardson is a 83 y.o. female with medical history significant of hypertension, CAD, CABG, dCHF, bullous pemphigoid, thrombocytopenia, stroke, s/p of nephrectomy, ischemic bowel disease, mitral valve replacement with mechanical valves 2001 on coumadin, DVT, atrial fibrillation, hypothyroidism, who presents with fever, confusion and leg pain.  Clinical Impression  The patient reports much pain involved in the left leg and right arm. The patient required 2 person assist for stand and pivot transfers, incontinent of  Urine during transfer. Pt admitted with above diagnosis. Pt currently with functional limitations due to the deficits listed below (see PT Problem List). Pt will benefit from skilled PT to increase their independence and safety with mobility to allow discharge to the venue listed below.       Follow Up Recommendations SNF;Supervision/Assistance - 24 hour (patient states going home)    Equipment Recommendations  None recommended by PT    Recommendations for Other Services       Precautions / Restrictions Precautions Precautions: Fall Precaution Comments: left leg very painfull, wounds all over, R anticubital space  with painful skin tear-like wound..      Mobility  Bed Mobility Overal bed mobility: Needs Assistance Bed Mobility: Supine to Sit     Supine to sit: Mod assist;HOB elevated     General bed mobility comments: assist with legs to take care not to  shear over bed edge as left leg  has no dressings. use of bed pad to scoot to edge.  Transfers Overall transfer level: Needs assistance Equipment used: Rolling walker (2 wheeled) Transfers: Sit to/from [facility] Sit to Stand: +2 physical assistance;+2 safety/equipment;Max assist Stand pivot transfers: +2 physical assistance;Max assist;+2  safety/equipment       General transfer comment: incontinent of urine during transfer. difficulty taking   steps to  move over to recliner. extra time to mobilize.   Ambulation/Gait                Stairs            Wheelchair Mobility    Modified Rankin (Stroke Patients Only)       Balance Overall balance assessment: Needs assistance Sitting-balance support: Feet supported;Bilateral upper extremity supported Sitting balance-Leahy Scale: Fair     Standing balance support: During functional activity;Bilateral upper extremity supported Standing balance-Leahy Scale: Poor                               Pertinent Vitals/Pain Pain Assessment: Faces Faces Pain Scale: Hurts worst Pain Location: left leg,, right anticubital tear, right  leg spasm Pain Descriptors / Indicators: Sore;Burning;Cramping;Discomfort;Dull;Grimacing Pain Intervention(s): Limited activity within patient's tolerance;Monitored during session;Repositioned    Home Living Family/patient expects to be discharged to:: Private residence Living Arrangements: Alone Available Help at Discharge: Family;Available 24 hours/day Type of Home: House Home Access: Stairs to enter Entrance Stairs-Rails: Right Entrance Stairs-Number of Steps: 2 Home Layout: One level Home Equipment: Walker - 2 wheels;Cane - single point;Bedside commode;Shower seat;Grab bars - toilet;Grab bars - tub/shower;Hand held shower head Additional Comments: faMILY IN AND OUT MULTIPLE TIMES DURING THE DAY.    Prior Function Level of Independence: Needs assistance;Independent with assistive device(s)   Gait / Transfers Assistance Needed: uses RW in side  ADL's / Homemaking Assistance Needed: needs assistance for bathing, but can do bird bath,  family assists with shopping  Comments: family is available to help as needed      Hand Dominance   Dominant Hand: Right    Extremity/Trunk Assessment   Upper Extremity  Assessment Upper Extremity Assessment: Generalized weakness    Lower Extremity Assessment Lower Extremity Assessment: RLE deficits/detail;LLE deficits/detail RLE Deficits / Details: able to weight bear LLE Deficits / Details: wounds all along the leg, painful to touch anywhere    Cervical / Trunk Assessment Cervical / Trunk Assessment: Kyphotic  Communication   Communication: HOH  Cognition Arousal/Alertness: Awake/alert Behavior During Therapy: WFL for tasks assessed/performed Overall Cognitive Status: Within Functional Limits for tasks assessed                                        General Comments      Exercises     Assessment/Plan    PT Assessment Patient needs continued PT services  PT Problem List Decreased strength;Decreased activity tolerance;Decreased range of motion;Decreased balance;Decreased mobility;Decreased knowledge of precautions;Decreased safety awareness;Decreased knowledge of use of DME;Pain       PT Treatment Interventions DME instruction;Gait training;Functional mobility training;Therapeutic activities;Therapeutic exercise;Patient/family education    PT Goals (Current goals can be found in the Care Plan section)  Acute Rehab PT Goals Patient Stated Goal: to go home, not have leg pain PT Goal Formulation: With patient/family Time For Goal Achievement: 12/14/16 Potential to Achieve Goals: Fair    Frequency Min 3X/week   Barriers to discharge        Co-evaluation               AM-PAC PT "6 Clicks" Daily Activity  Outcome Measure Difficulty turning over in bed (including adjusting bedclothes, sheets and blankets)?: Total Difficulty moving from lying on back to sitting on the side of the bed? : Total Difficulty sitting down on and standing up from a chair with arms (e.g., wheelchair, bedside commode, etc,.)?: Total Help needed moving to and from a bed to chair (including a wheelchair)?: Total Help needed walking in  hospital room?: Total Help needed climbing 3-5 steps with a railing? : Total 6 Click Score: 6    End of Session   Activity Tolerance: Patient limited by pain Patient left: in chair;with call bell/phone within reach;with family/visitor present Nurse Communication: Mobility status PT Visit Diagnosis: Difficulty in walking, not elsewhere classified (R26.2);Pain Pain - Right/Left: Left Pain - part of body: Leg    Time: 4431-5400 PT Time Calculation (min) (ACUTE ONLY): 41 min   Charges:   PT Evaluation $PT Eval Moderate Complexity: 1 Mod PT Treatments $Therapeutic Activity: 23-37 mins   PT G Codes:   PT G-Codes **NOT FOR INPATIENT CLASS** Functional Assessment Tool Used: AM-PAC 6 Clicks Basic Mobility;Clinical judgement Functional Limitation: Mobility: Walking and moving around Mobility: Walking and Moving Around Current Status (Q6761): 100 percent impaired, limited or restricted Mobility: Walking and Moving Around Goal Status (P5093): At least 20 percent but less than 40 percent impaired, limited or restricted    [facility] PT [phone]   Claretha Cooper 11/30/2016, 4:28 PM

## 2016-11-30 NOTE — Progress Notes (Signed)
PROGRESS NOTE  Amanda Richardson CWU:[id number] DOB: 1923-08-21 DOA: 11/29/2016 PCP: Merrilee Seashore, MD   LOS: 0 days   Brief Narrative / Interim history: 83 y.o. female with medical histoy significant for chronic diastolic CHF, chronic atrial fibrillation on warfarin, and bullous pemphigoid, now presenting to the emergency department for evaluation of increased redness and tenderness to the lower extremities, as well as a fever the night prior to admission.  She was admitted to the hospital for left lower extremity cellulitis and placed on IV Zosyn.  She has had a recent hospitalization 2 months ago for bilateral lower leg cellulitis, she improved with antibiotics.  She was followed as an outpatient by wound care nurse.  Assessment & Plan: Principal Problem:   Cellulitis Active Problems:   Hypertension   Chronic atrial fibrillation (HCC)   Hypothyroidism   Bullous pemphigoid   Chronic diastolic CHF (congestive heart failure) (HCC)   Left lower leg cellulitis -Does not meet criteria for sepsis, white count is normal, she is not tachycardic and afebrile.  Started on IV antibiotics of Zosyn, continue.  It appears that her left lower extremity is improving some, however still look significantly erythematous requiring ongoing IV antibiotics.  Recurrent infection likely in the setting of bullous pemphigoid with recurrent blistering -Wound care consulted  Chronic A. fib -Rate controlled, continue Coumadin per pharmacy, continue beta-blockers  Chronic diastolic CHF -Appears euvolemic, continue her home dose of Lasix, Coreg and lisinopril  Hypothyroidism -Continue Synthroid   DVT prophylaxis: On Coumadin Code Status: DNR Family Communication: Discussed with daughter at bedside Disposition Plan: Home in stable  Consultants:   None  Procedures:   None   Antimicrobials:  Zosyn 8/8 >>   Subjective: -Appreciates her left lower extremity redness has improved.  Appreciates  that the swelling is improved.  Denies any chest pain, no shortness of breath, no abdominal pain nausea vomiting.  Objective: Vitals:   11/29/16 2320 11/30/16 0000 11/30/16 0025 11/30/16 0417  BP: 129/74  (!) 158/85 134/65  Pulse: 89  72 76  Resp: 16  16 16   Temp:   98.8 F (37.1 C) 98.3 F (36.8 C)  TempSrc:   Oral Oral  SpO2: 100%  100% 96%  Weight:  54.9 kg (121 lb 0.5 oz)    Height:  5\' 4"  (1.626 m)      Intake/Output Summary (Last 24 hours) at 11/30/16 1254 Last data filed at 11/30/16 1033  Gross per 24 hour  Intake             1045 ml  Output              500 ml  Net              545 ml   Filed Weights   11/30/16 0000  Weight: 54.9 kg (121 lb 0.5 oz)    Examination:  Vitals:   11/29/16 2320 11/30/16 0000 11/30/16 0025 11/30/16 0417  BP: 129/74  (!) 158/85 134/65  Pulse: 89  72 76  Resp: 16  16 16   Temp:   98.8 F (37.1 C) 98.3 F (36.8 C)  TempSrc:   Oral Oral  SpO2: 100%  100% 96%  Weight:  54.9 kg (121 lb 0.5 oz)    Height:  5\' 4"  (1.626 m)      Constitutional: NAD Respiratory: clear to auscultation bilaterally, no wheezing, no crackles. Normal respiratory effort. No accessory muscle use.  Cardiovascular: irregular, no murmurs / rubs / gallops. No LE edema.  2+ pedal pulses. No carotid bruits.  Abdomen: no tenderness. Bowel sounds positive.  Musculoskeletal: no clubbing / cyanosis.  Skin: Several blistering lesions on the left lower extremity surrounded by large cellulitic area extending from the lateral ankle to mid shin. Neurologic: CN 2-12 grossly intact. Strength 5/5 in all 4.  Psychiatric: Normal judgment and insight. Alert and oriented x 3. Normal mood.    Data Reviewed: I have independently reviewed following labs and imaging studies   CBC:  Recent Labs Lab 11/29/16 1818 11/30/16 0119  WBC 9.4 8.2  NEUTROABS 6.6 5.3  HGB 11.5* 11.2*  HCT 34.2* 32.9*  MCV 89.8 91.4  PLT 174 725   Basic Metabolic Panel:  Recent Labs Lab  11/29/16 1818 11/30/16 0119  NA 139 139  K 3.5 3.3*  CL 104 106  CO2 27 24  GLUCOSE 111* 113*  BUN 20 19  CREATININE 1.06* 0.97  CALCIUM 8.7* 8.3*   GFR: Estimated Creatinine Clearance: 32.6 mL/min (by C-G formula based on SCr of 0.97 mg/dL). Liver Function Tests:  Recent Labs Lab 11/29/16 1818  AST 27  ALT 13*  ALKPHOS 104  BILITOT 1.0  PROT 6.9  ALBUMIN 3.1*   No results for input(s): LIPASE, AMYLASE in the last 168 hours. No results for input(s): AMMONIA in the last 168 hours. Coagulation Profile:  Recent Labs Lab 11/29/16 2157 11/30/16 0119  INR 3.98 3.99   Cardiac Enzymes: No results for input(s): CKTOTAL, CKMB, CKMBINDEX, TROPONINI in the last 168 hours. BNP (last 3 results) No results for input(s): PROBNP in the last 8760 hours. HbA1C: No results for input(s): HGBA1C in the last 72 hours. CBG: No results for input(s): GLUCAP in the last 168 hours. Lipid Profile: No results for input(s): CHOL, HDL, LDLCALC, TRIG, CHOLHDL, LDLDIRECT in the last 72 hours. Thyroid Function Tests: No results for input(s): TSH, T4TOTAL, FREET4, T3FREE, THYROIDAB in the last 72 hours. Anemia Panel: No results for input(s): VITAMINB12, FOLATE, FERRITIN, TIBC, IRON, RETICCTPCT in the last 72 hours. Urine analysis:    Component Value Date/Time   COLORURINE YELLOW 09/21/2016 [place] 09/21/2016 0638   LABSPEC 1.016 09/21/2016 0638   PHURINE 5.0 09/21/2016 0638   GLUCOSEU NEGATIVE 09/21/2016 0638   HGBUR MODERATE (A) 09/21/2016 0638   BILIRUBINUR NEGATIVE 09/21/2016 0638   KETONESUR NEGATIVE 09/21/2016 0638   PROTEINUR 100 (A) 09/21/2016 0638   UROBILINOGEN 4.0 (H) 08/04/2014 1608   NITRITE NEGATIVE 09/21/2016 0638   LEUKOCYTESUR NEGATIVE 09/21/2016 0638   Sepsis Labs: Invalid input(s): PROCALCITONIN, LACTICIDVEN  No results found for this or any previous visit (from the past 240 hour(s)).    Radiology Studies: No results found.   Scheduled  Meds: . acidophilus  1 capsule Oral QODAY  . carvedilol  6.25 mg Oral BID  . cholecalciferol  2,000 Units Oral QPM  . [START ON 12/01/2016] furosemide  40 mg Oral Q M,W,F  . latanoprost  1 drop Both Eyes QHS  . levothyroxine  50 mcg Oral Daily  . lisinopril  2.5 mg Oral Daily  . liver oil-zinc oxide   Topical BID  . multivitamin with minerals  1 tablet Oral QPM  . niacinamide  500 mg Oral BID WC  . potassium chloride SA  20 mEq Oral Daily  . sodium chloride flush  3 mL Intravenous Q12H   Continuous Infusions: . sodium chloride 250 mL (11/30/16 0430)  . piperacillin-tazobactam (ZOSYN)  IV 3.375 g (11/30/16 0423)    Marzetta Board, MD, PhD  [facility] Pager [phone]  If 7PM-7AM, please contact night-coverage [email or web address] Password [email or web address] 11/30/2016, 12:54 PM

## 2016-11-30 NOTE — Progress Notes (Signed)
Pharmacy Antibiotic Note  Amanda Richardson is a 83 y.o. female with chronic leg redness and infection admitted on 11/29/2016 with cellulitis.  Pharmacy has been consulted for zosyn dosing.  Plan: Zosyn 3.375g IV q8h (4 hour infusion).  F/u scr/cultures  Height: 5\' 4"  (162.6 cm) Weight: 121 lb 0.5 oz (54.9 kg) IBW/kg (Calculated) : 54.7  Temp (24hrs), Avg:98.4 F (36.9 C), Min:98 F (36.7 C), Max:98.8 F (37.1 C)   Recent Labs Lab 11/29/16 1818 11/29/16 1831 11/30/16 0119  WBC 9.4  --  8.2  CREATININE 1.06*  --   --   LATICACIDVEN  --  1.22  --     Estimated Creatinine Clearance: 29.9 mL/min (A) (by C-G formula based on SCr of 1.06 mg/dL (H)).    Allergies  Allergen Reactions  . Heparin Anaphylaxis  . Codeine Other (See Comments)    Unknown     Antimicrobials this admission: 8/8 zosyn >>    >>   Dose adjustments this admission:   Microbiology results:  BCx:   UCx:    Sputum:    MRSA PCR:   Thank you for allowing pharmacy to be a part of this patient's care.  Dorrene German 11/30/2016 1:45 AM

## 2016-12-01 LAB — BASIC METABOLIC PANEL
Anion gap: 8 (ref 5–15)
BUN: 16 mg/dL (ref 6–20)
CALCIUM: 8 mg/dL — AB (ref 8.9–10.3)
CO2: 25 mmol/L (ref 22–32)
CREATININE: 0.81 mg/dL (ref 0.44–1.00)
Chloride: 103 mmol/L (ref 101–111)
GFR calc non Af Amer: >60 mL/min (ref >60)
Glucose, Bld: 88 mg/dL (ref 65–99)
Potassium: 3.1 mmol/L — ABNORMAL LOW (ref 3.5–5.1)
SODIUM: 136 mmol/L (ref 135–145)

## 2016-12-01 LAB — PROTIME-INR
INR: 2.95
PROTHROMBIN TIME: 31.4 s — AB (ref 11.4–15.2)

## 2016-12-01 MED ORDER — WARFARIN - PHARMACIST DOSING INPATIENT: Freq: Every day | Status: DC

## 2016-12-01 MED ORDER — WARFARIN SODIUM 2.5 MG PO TABS
2.5000 mg | ORAL_TABLET | Freq: Once | ORAL | Status: AC
  Administered 2016-12-01: 2.5 mg via ORAL
  Filled 2016-12-01: qty 1

## 2016-12-01 NOTE — Progress Notes (Signed)
Amanda Richardson for Warfarin Indication: atrial fibrillation  Allergies  Allergen Reactions  . Heparin Anaphylaxis  . Codeine Other (See Comments)    Unknown      Height: 5\' 4"  (162.6 cm) Weight: 119 lb 0.8 oz (54 kg) IBW/kg (Calculated) : 54.7  Vital Signs: Temp: 98.2 F (36.8 C) (08/10 0457) Temp Source: Oral (08/10 0457) BP: 146/72 (08/10 0457) Pulse Rate: 84 (08/10 0457)  Labs:  Recent Labs  11/29/16 1818 11/29/16 2157 11/30/16 0119 12/01/16 0544  HGB 11.5*  --  11.2*  --   HCT 34.2*  --  32.9*  --   PLT 174  --  158  --   LABPROT  --  39.9* 39.9* 31.4*  INR  --  3.98 3.99 2.95  CREATININE 1.06*  --  0.97 0.81    Estimated Creatinine Clearance: 38.6 mL/min (by C-G formula based on SCr of 0.81 mg/dL).   Medical History: Past Medical History:  Diagnosis Date  . Allergic rhinitis   . Anemia    not applicable since JSE-83T when pt had hysterectomy   . Basal cell carcinoma   . Bilateral lower extremity edema    history of venous insufficiency  . Bullous pemphigoid   . Chronic anticoagulation   . Chronic atrial fibrillation ([place])   . Chronic diastolic CHF (congestive heart failure) ([place])   . Coronary artery disease    a. CABG 2001 at time of MVR.  Marland Kitchen DVT (deep venous thrombosis) ([place])    possible in 1990s and does not remember being on blood thinner  . Glaucoma   . H/O mitral valve replacement 2001   mechanical heart valve; family states indication may have been MR from MVP or  rheumatic heart disease  . Hypertension   . Hypothyroidism   . Ischemic bowel disease ([place])    missing 2/3 of bowel  . S/p nephrectomy    left  . Stroke [facility])    a. CT head 2018 showed old left anterior watershed infarct, old right caudate lacunar infarct.  . Thrombocytopenia ([place])   . Urinary incontinence     Medications:  Scheduled:  . acidophilus  1 capsule Oral QODAY  . carvedilol  6.25 mg Oral BID  . cholecalciferol  2,000 Units  Oral QPM  . furosemide  40 mg Oral Q M,W,F  . latanoprost  1 drop Both Eyes QHS  . levothyroxine  50 mcg Oral Daily  . lisinopril  2.5 mg Oral Daily  . liver oil-zinc oxide   Topical BID  . multivitamin with minerals  1 tablet Oral QPM  . niacinamide  500 mg Oral BID WC  . potassium chloride SA  20 mEq Oral Daily  . sodium chloride flush  3 mL Intravenous Q12H   Infusions:  . sodium chloride 250 mL (11/30/16 0430)  . piperacillin-tazobactam (ZOSYN)  IV 3.375 g (12/01/16 0436)    Assessment: 73 yoF presents with chronic leg redness and infection on chronic warfarin for A-fib with history of stroke. Home dosage 3 mg daily, unsure of when last taken. INR was supratherapeutic (3.98) on admission.   Pharmacy is consulted to assist warfarin while inpatient.  12/01/2016 INR now down to therapeutic range after holding warfarin yesterday. H/H slightly low but stable.   Pltc WNL. Heart healthy diet ordered. Concomitant Zosyn noted.  Broad-spectrum antibiotics sometimes increase INR response to warfarin.  Goal of Therapy:  INR 2 - 3   Plan:  Warfarin 2.5 mg po  today at 6pm. Daily PT/INR while inpatient.  Clayburn Pert, PharmD, BCPS Pager: [phone] 12/01/2016  10:06 AM

## 2016-12-01 NOTE — Progress Notes (Signed)
PROGRESS NOTE  Amanda Richardson CVE:[id number] DOB: July 29, 1923 DOA: 11/29/2016 PCP: Merrilee Seashore, MD   LOS: 1 day   Brief Narrative / Interim history: 83 yo female with past medical history significant for chronic diastolic CHF, chronic Afib on anticoagulation (warfarin), and bullous pemphigoid presented to the [facility] ED with c/o increased erythema and tenderness to palpation of the lower extremities with fever the previous night. In the ED, she was found to be afebrile with a reassuring lactic acid of 1.22. She was admitted for LLE cellulitis and received IV Zosyn. Of note, she has had a recent prior hospitalization 2 months ago for b/l lower leg cellulitis that improved with antibiotics and has been followed by an outpatient wound care nurse.   Assessment & Plan: Principal Problem:   Cellulitis Active Problems:   Hypertension   Chronic atrial fibrillation (HCC)   Hypothyroidism   Bullous pemphigoid   Chronic diastolic CHF (congestive heart failure) (HCC)  Left lower leg cellulitis without sepsis -H/o bullous pemphigoid with recurrent infection and blistering.  -Not septic: no AMS, afebrile, nml WBC, not tachycardic/tachypnic, SBP>100. -Wound care consulted. LLE wrapped with gauze & xeroform.  -Consider chronic suppression with low dose keflex or amoxicillin to prevent future infection with blistering. -Continue IV Zosyn.  Chronic A. Fib -Rate controlled on chronic anticoagulation. CHADS2VASc = 8 [Age X2, Female, CHF, HTN, Stroke, Vascular Dz] -Continue beta-blockers and coumadin per pharmacy.  Chronic Diastolic CHF -Patient still appears euvolemic.  -Continue home lasix, coreg, lisinopril.  Hypothyroidism -Continue Synthroid.  DVT prophylaxis: Coumadin Code Status: DNR Family Communication: Daughter at bedside Disposition Plan: Home with possible d/c date 8/12.  Consultants:   None  Procedures:   None  Antimicrobials:  Zosyn 8/8>>>>  Subjective: Patient  states that she feels better today and thinks that her LLE looks much better with less edema and erythema. She does report some b/l pain to both her heels, which her daughter states she feels are just from the pressure of being in bed. She reports a healthy appetite without n/v/d. She slept well last night and denies CP, SOB, f/c. She does not report abdominal pain, dysuria, diarrhea, or constipation.   Objective: Vitals:   11/30/16 0417 11/30/16 1425 11/30/16 2036 12/01/16 0457  BP: 134/65 130/73 (!) 152/69 (!) 146/72  Pulse: 76 87 88 84  Resp: 16 18 18 16   Temp: 98.3 F (36.8 C) 98.6 F (37 C) 98.5 F (36.9 C) 98.2 F (36.8 C)  TempSrc: Oral Oral Oral Oral  SpO2: 96% 98% 98% 96%  Weight:  54 kg (119 lb 0.8 oz)    Height:        Intake/Output Summary (Last 24 hours) at 12/01/16 1111 Last data filed at 12/01/16 0600  Gross per 24 hour  Intake             1110 ml  Output              500 ml  Net              610 ml   Filed Weights   11/30/16 0000 11/30/16 1425  Weight: 54.9 kg (121 lb 0.5 oz) 54 kg (119 lb 0.8 oz)    Examination:  Vitals:   11/30/16 0417 11/30/16 1425 11/30/16 2036 12/01/16 0457  BP: 134/65 130/73 (!) 152/69 (!) 146/72  Pulse: 76 87 88 84  Resp: 16 18 18 16   Temp: 98.3 F (36.8 C) 98.6 F (37 C) 98.5 F (36.9 C) 98.2 F (  36.8 C)  TempSrc: Oral Oral Oral Oral  SpO2: 96% 98% 98% 96%  Weight:  54 kg (119 lb 0.8 oz)    Height:        Constitutional: NAD.Thin woman. Eyes: lids and conjunctivae normal ENMT: Mucous membranes are moist. No oropharyngeal exudates Neck: normal, supple, no masses Respiratory: clear to auscultation bilaterally, no wheezing, no crackles. Normal respiratory effort. No accessory muscle use.  Cardiovascular: IRIR, no murmurs / rubs / gallops. No LE edema. 2+ pedal pulses. Abdomen: no tenderness..  Musculoskeletal: visually appreciated kyphosis, no clubbing / cyanosis.  Skin: blisters and lesions to LLE with erythema and edema  extending from below malleolus to level of mid shin. Psychiatric: Normal judgment and insight. Alert and oriented x 3. Normal mood.    Data Reviewed: I have independently reviewed following labs and imaging studies   CBC:  Recent Labs Lab 11/29/16 1818 11/30/16 0119  WBC 9.4 8.2  NEUTROABS 6.6 5.3  HGB 11.5* 11.2*  HCT 34.2* 32.9*  MCV 89.8 91.4  PLT 174 638   Basic Metabolic Panel:  Recent Labs Lab 11/29/16 1818 11/30/16 0119 12/01/16 0544  NA 139 139 136  K 3.5 3.3* 3.1*  CL 104 106 103  CO2 27 24 25   GLUCOSE 111* 113* 88  BUN 20 19 16   CREATININE 1.06* 0.97 0.81  CALCIUM 8.7* 8.3* 8.0*   GFR: Estimated Creatinine Clearance: 38.6 mL/min (by C-G formula based on SCr of 0.81 mg/dL). Liver Function Tests:  Recent Labs Lab 11/29/16 1818  AST 27  ALT 13*  ALKPHOS 104  BILITOT 1.0  PROT 6.9  ALBUMIN 3.1*   No results for input(s): LIPASE, AMYLASE in the last 168 hours. No results for input(s): AMMONIA in the last 168 hours. Coagulation Profile:  Recent Labs Lab 11/29/16 2157 11/30/16 0119 12/01/16 0544  INR 3.98 3.99 2.95   Cardiac Enzymes: No results for input(s): CKTOTAL, CKMB, CKMBINDEX, TROPONINI in the last 168 hours. BNP (last 3 results) No results for input(s): PROBNP in the last 8760 hours. HbA1C: No results for input(s): HGBA1C in the last 72 hours. CBG: No results for input(s): GLUCAP in the last 168 hours. Lipid Profile: No results for input(s): CHOL, HDL, LDLCALC, TRIG, CHOLHDL, LDLDIRECT in the last 72 hours. Thyroid Function Tests: No results for input(s): TSH, T4TOTAL, FREET4, T3FREE, THYROIDAB in the last 72 hours. Anemia Panel: No results for input(s): VITAMINB12, FOLATE, FERRITIN, TIBC, IRON, RETICCTPCT in the last 72 hours. Urine analysis:    Component Value Date/Time   COLORURINE YELLOW 09/21/2016 [place] 09/21/2016 0638   LABSPEC 1.016 09/21/2016 0638   PHURINE 5.0 09/21/2016 0638   GLUCOSEU NEGATIVE  09/21/2016 0638   HGBUR MODERATE (A) 09/21/2016 0638   BILIRUBINUR NEGATIVE 09/21/2016 0638   KETONESUR NEGATIVE 09/21/2016 0638   PROTEINUR 100 (A) 09/21/2016 0638   UROBILINOGEN 4.0 (H) 08/04/2014 1608   NITRITE NEGATIVE 09/21/2016 0638   LEUKOCYTESUR NEGATIVE 09/21/2016 0638   Sepsis Labs: Invalid input(s): PROCALCITONIN, LACTICIDVEN  No results found for this or any previous visit (from the past 240 hour(s)).    Radiology Studies: No results found.   Scheduled Meds: . acidophilus  1 capsule Oral QODAY  . carvedilol  6.25 mg Oral BID  . cholecalciferol  2,000 Units Oral QPM  . furosemide  40 mg Oral Q M,W,F  . latanoprost  1 drop Both Eyes QHS  . levothyroxine  50 mcg Oral Daily  . lisinopril  2.5 mg Oral Daily  .  liver oil-zinc oxide   Topical BID  . multivitamin with minerals  1 tablet Oral QPM  . niacinamide  500 mg Oral BID WC  . potassium chloride SA  20 mEq Oral Daily  . sodium chloride flush  3 mL Intravenous Q12H  . warfarin  2.5 mg Oral ONCE-1800  . Warfarin - Pharmacist Dosing Inpatient   Does not apply q1800   Continuous Infusions: . sodium chloride 250 mL (11/30/16 0430)  . piperacillin-tazobactam (ZOSYN)  IV 3.375 g (12/01/16 0436)     Marrianne Mood, Student-PA  12/01/2016 11:11 AM

## 2016-12-01 NOTE — Progress Notes (Signed)
CSW met with pt / daughter at bedside to assist with dc planning. PN reviewed. PT has recommended SNF at d/c. CSW reviewed recommendations with pt / daughter. Daughter reports that pt has been in rehab in the past and she did not have a good experience. Daughter reports that pt will most likely return home at dc. Daughter will discuss dc plan with other family members and contact CSW with their decision. Daughter is aware that pt has [employer] which requires prior authorization for SNF placement and insurance is not open over the weekend. CSW encouraged daughter to contact CSW as soon as possible if they are interested in SNF placement at dc.  Werner Lean LCSW [phone]

## 2016-12-01 NOTE — Progress Notes (Signed)
Pt's daughter is concerned about dressings on patient's rt hip.  Two were placed by the nursing facility she came from and one was placed in the Emergency Department.  Daughter would like for the doctor to examine them and direct care for them.

## 2016-12-02 LAB — PROTIME-INR
INR: 2.37
PROTHROMBIN TIME: 26.3 s — AB (ref 11.4–15.2)

## 2016-12-02 MED ORDER — WARFARIN SODIUM 3 MG PO TABS
3.0000 mg | ORAL_TABLET | Freq: Every day | ORAL | Status: DC
  Administered 2016-12-02 – 2016-12-03 (×2): 3 mg via ORAL
  Filled 2016-12-02 (×2): qty 1

## 2016-12-02 NOTE — Progress Notes (Signed)
[place] for Warfarin Indication: atrial fibrillation  Allergies  Allergen Reactions  . Heparin Anaphylaxis  . Codeine Other (See Comments)    Unknown      Height: 5\' 4"  (162.6 cm) Weight: 119 lb 0.8 oz (54 kg) IBW/kg (Calculated) : 54.7  Vital Signs: Temp: 98.2 F (36.8 C) (08/11 0505) Temp Source: Oral (08/11 0505) BP: 138/70 (08/11 0505) Pulse Rate: 77 (08/11 0505)  Labs:  Recent Labs  11/29/16 1818  11/30/16 0119 12/01/16 0544 12/02/16 0540  HGB 11.5*  --  11.2*  --   --   HCT 34.2*  --  32.9*  --   --   PLT 174  --  158  --   --   LABPROT  --   < > 39.9* 31.4* 26.3*  INR  --   < > 3.99 2.95 2.37  CREATININE 1.06*  --  0.97 0.81  --   < > = values in this interval not displayed.  Estimated Creatinine Clearance: 38.6 mL/min (by C-G formula based on SCr of 0.81 mg/dL).   Assessment: 53 yoF presents with chronic leg redness and infection on chronic warfarin for A-fib with history of stroke. Home dosage 3 mg daily, unsure of when last taken. INR was supratherapeutic (3.98) on admission.   Pharmacy is consulted to assist warfarin while inpatient.  12/02/2016 INR therapeutic after holding warfarin yesterday and unsure if dose given 8/8 prior to admission ( maybe held x 2 days) No bleeding reported Zosyn noted.  Broad-spectrum antibiotics sometimes increase INR response to warfarin.  Goal of Therapy:  INR 2 - 3   Plan:  Resume home dose of 3 mg daily at 1800. Daily PT/INR while inpatient.  Eudelia Bunch, Pharm.D. [phone] 12/02/2016 11:17 AM

## 2016-12-02 NOTE — Clinical Social Work Note (Addendum)
Clinical Social Work Assessment  Patient Details  Name: Amanda Richardson MRN: [id number] Date of Birth: 06-18-1923  Date of referral:  12/02/16               Reason for consult:  Facility Placement                Permission sought to share information with:  Facility Sport and exercise psychologist, Family Supports Permission granted to share information::  Yes, Verbal Permission Granted  Name:   2 Daughers::  Helyn Numbers [phone] and Urban [phone]  Both are HCPOA's:         Contact Information:  Chief Financial Officer SNF's, 2 Daughters  Housing/Transportation Living arrangements for the past 2 months:  Single Family Home Source of Information:  Patient, Adult Children Patient Interpreter Needed:  None Criminal Activity/Legal Involvement Pertinent to Current Situation/Hospitalization:  No - Comment as needed Significant Relationships:  Adult Children Lives with:  Self Do you feel safe going back to the place where you live?  No (Not right now. Hopes to get rehab and return home in the future) Need for family participation in patient care:  Yes (Comment)  Care giving concerns:  Patient was living alone- 1 daughter lives nearby and patient was checked on frequently.  She had [place] in place for wound issues. Daughters wanted to take patient home but feel she is too weak.  Social Worker assessment / plan:  CSW notified by nursing staff that patient's family had questions about DNR status, Forensic scientist.  After meeting extensively with patient and family- it was finally determined that patient has an Advanced Directive at home (locked in safe), and wants to be a DNR.  Family needs Out of Facility DNR form to take home. Blank form placed on chart for MD's signature. Also during this visit- daughters verbalized that they have changed their mind about short term SNF as they feel patient is too sick and weak to retur home at this time.  Patient has been placed in the past  at both [facility] and later at [facility] (d/c'd from this facility April 24th, 2018). They currently do not have a SNF preference and agree to SNF search.  Prefer a private room if possible.  Patient has [employer].  Active bed search will be initiated.  Discussed with Dr. Cruzita Lederer- feels patient will hopefully be stable for d/c the first of the week.  Employment status:  Retired Surveyor, minerals Care PT Recommendations:  [place] / Referral to community resources:  [place]  Patient/Family's Response to care:  Family and patient are pleased with her progress and care but do not feel she is strong enough to return home.  Patient/Family's Understanding of and Emotional Response to Diagnosis, Current Treatment, and Prognosis: Daughters are able to verbalize a fairly good understanding of patient's medical condition and on going needs.  Patient, while alert and oriented most of the time- will mostly defer to her daughters- however she agrees she needs rehab. Hopes to be able to return home when stronger.  Discussed bed search process; daughter states she has list of [place].  FL2 to be completed and sent out to facilities for review.    Emotional Assessment Appearance:  Appears younger than stated age Attitude/Demeanor/Rapport:   (Cooperative, responsive, calm, rational, friendly) Affect (typically observed):  Accepting, Quiet, Pleasant Orientation:  Oriented to Self, Oriented to Place, Oriented to Situation (  Some fluctuation with orientation at times) Alcohol / Substance use:  Never Used Psych involvement (Current and /or in the community):  No (Comment)  Discharge Needs  Concerns to be addressed:  Care Coordination Readmission within the last 30 days:  No Current discharge risk:  Lives alone, Physical Impairment Barriers to Discharge:  Continued Medical Work up, Ship broker (Has [employer])   [name], LCSW 12/02/2016, 4;50 PM

## 2016-12-02 NOTE — Progress Notes (Signed)
Dressing changed to lower left leg as ordered. Cleansed with NS and Xeroform applied and covered with Kerlix. Also applied Xeroform as instructed by MD to other skin areas of broken blisters and tears caused by tape. Tape removed with NS from lt arm. Rt groin areas open cleansed with NS and xeroform applied yet unable to remove "silver alginate as applied at 2 sites of groin area" applied Monday according to family. Abdominal areas also cleansed and dressed with Xeroform.

## 2016-12-02 NOTE — Clinical Social Work Placement (Signed)
   CLINICAL SOCIAL WORK PLACEMENT  NOTE  Date:  12/02/2016  Patient Details  Name: Amanda Richardson MRN: [id number] Date of Birth: [birth year]  Clinical Social Work is seeking post-discharge placement for this patient at the [place] level of care (*CSW will initial, date and re-position this form in  chart as items are completed):  Yes   Patient/family provided with [place] Work Department's list of facilities offering this level of care within the geographic area requested by the patient (or if unable, by the patient's family).  Yes   Patient/family informed of their freedom to choose among providers that offer the needed level of care, that participate in Medicare, Medicaid or managed care program needed by the patient, have an available bed and are willing to accept the patient.  Yes   Patient/family informed of [place]'s ownership interest in [facility] and [facility], as well as of the fact that they are under no obligation to receive care at these facilities.  PASRR submitted to EDS on       PASRR number received on       Existing PASRR number confirmed on 12/02/16     FL2 transmitted to all facilities in geographic area requested by pt/family on 12/02/16     FL2 transmitted to all facilities within larger geographic area on       Patient informed that his/her managed care company has contracts with or will negotiate with certain facilities, including the following:   [facility])         Patient/family informed of bed offers received.  Patient chooses bed at       Physician recommends and patient chooses bed at      Patient to be transferred to   on  .  Patient to be transferred to facility by [facility])     Patient family notified on   of transfer.  Name of family member notified:        PHYSICIAN Please prepare prescriptions, Please sign FL2, Please sign DNR     Additional Comment:     _______________________________________________ [name], LCSW 12/02/2016, 5:00 PM

## 2016-12-02 NOTE — NC FL2 (Signed)
[place] LEVEL OF CARE SCREENING TOOL     IDENTIFICATION  Patient Name: Amanda Richardson Birthdate: 08/11/1923 Sex: female Admission Date (Current Location): 11/29/2016  [facility] and [place] Number:  Herbalist and Address:  [facility],  [place] [address], [place]      Provider Number: [id number]  Attending Physician Name and Address:  Caren Griffins, MD  Relative Name and Phone Number:       Current Level of Care: Hospital Recommended Level of Care: [place] Prior Approval Number:    Date Approved/Denied:   PASRR Number: [id number]  Discharge Plan: SNF    Current Diagnoses: Patient Active Problem List   Diagnosis Date Noted  . Pressure injury of skin 09/22/2016  . Acute metabolic encephalopathy 31/49/7026  . Sepsis ([place]) 09/21/2016  . Cellulitis 09/21/2016  . Permanent atrial fibrillation ([place])   . Fall   . Chronic diastolic CHF (congestive heart failure) ([place]) 07/16/2016  . Anemia 07/16/2016  . Thrombocytopenia ([place]) 07/16/2016  . Chronic venous insufficiency 07/16/2016  . Hematoma of left hip 07/13/2016  . H/O mechanical mitral valve replacement 2001   . Hypertension   . S/p nephrectomy   . H/O coronary artery bypass surgery   . Chronic atrial fibrillation ([place])   . Hypothyroidism   . Bullous pemphigoid   . Chronic anticoagulation     Orientation RESPIRATION BLADDER Height & Weight     Self, Time, Place, Situation (Some fluctuation in orientation- mild)  Normal Incontinent Weight: 119 lb 0.8 oz (54 kg) Height:  5\' 4"  (162.6 cm)  BEHAVIORAL SYMPTOMS/MOOD NEUROLOGICAL BOWEL NUTRITION STATUS      Continent Diet (Heart Healthy)  AMBULATORY STATUS COMMUNICATION OF NEEDS Skin   Extensive Assist Verbally Other (Comment) (Cellulitis l leg, Blisters to R & L bottom, Skin Tear L posterior,)                       Personal Care Assistance Level of Assistance  Bathing, Dressing Bathing  Assistance: Limited assistance   Dressing Assistance: Limited assistance     Functional Limitations Info             SPECIAL CARE FACTORS FREQUENCY  PT (By licensed PT), OT (By licensed OT)     PT Frequency: 5 OT Frequency: 5            Contractures Contractures Info: Not present    Additional Factors Info  Code Status, Allergies Code Status Info: DNR Allergies Info: Heparin, Codeine           Current Medications (12/02/2016):  This is the current hospital active medication list Current Facility-Administered Medications  Medication Dose Route Frequency Provider Last Rate Last Dose  . 0.9 %  sodium chloride infusion  250 mL Intravenous PRN Opyd, Ilene Qua, MD 10 mL/hr at 11/30/16 0430 250 mL at 11/30/16 0430  . acetaminophen (TYLENOL) tablet 650 mg  650 mg Oral Q6H PRN Opyd, Ilene Qua, MD   650 mg at 12/02/16 1136   Or  . acetaminophen (TYLENOL) suppository 650 mg  650 mg Rectal Q6H PRN Opyd, Ilene Qua, MD      . acidophilus (RISAQUAD) capsule 1 capsule  1 capsule Oral QODAY Opyd, Ilene Qua, MD   1 capsule at 12/02/16 1132  . bisacodyl (DULCOLAX) EC tablet 5 mg  5 mg Oral Daily PRN Opyd, Ilene Qua, MD      . carvedilol (COREG) tablet 6.25 mg  6.25 mg Oral BID Vianne Bulls, MD   6.25 mg at 12/02/16 1132  . cholecalciferol (VITAMIN D) tablet 2,000 Units  2,000 Units Oral QPM Opyd, Ilene Qua, MD   2,000 Units at 12/02/16 1830  . furosemide (LASIX) tablet 40 mg  40 mg Oral Q M,W,F Opyd, Ilene Qua, MD   40 mg at 12/01/16 0935  . HYDROcodone-acetaminophen (NORCO/VICODIN) 5-325 MG per tablet 1-2 tablet  1-2 tablet Oral Q4H PRN Opyd, Ilene Qua, MD      . latanoprost (XALATAN) 0.005 % ophthalmic solution 1 drop  1 drop Both Eyes QHS Opyd, Ilene Qua, MD   1 drop at 12/01/16 2310  . levothyroxine (SYNTHROID, LEVOTHROID) tablet 50 mcg  50 mcg Oral Daily Opyd, Ilene Qua, MD   50 mcg at 12/02/16 1132  . lisinopril (PRINIVIL,ZESTRIL) tablet 2.5 mg  2.5 mg Oral Daily Opyd, Ilene Qua,  MD   2.5 mg at 12/02/16 1133  . liver oil-zinc oxide (DESITIN) 40 % ointment   Topical BID Opyd, Ilene Qua, MD   57 application at 94/50/38 2310  . multivitamin with minerals tablet 1 tablet  1 tablet Oral QPM Opyd, Ilene Qua, MD   1 tablet at 12/02/16 1830  . niacinamide tablet 500 mg  500 mg Oral BID WC Opyd, Ilene Qua, MD   500 mg at 11/30/16 1000  . ondansetron (ZOFRAN) tablet 4 mg  4 mg Oral Q6H PRN Opyd, Ilene Qua, MD       Or  . ondansetron (ZOFRAN) injection 4 mg  4 mg Intravenous Q6H PRN Opyd, Ilene Qua, MD      . piperacillin-tazobactam (ZOSYN) IVPB 3.375 g  3.375 g Intravenous Q8H Dorrene German, RPH 12.5 mL/hr at 12/02/16 2053 3.375 g at 12/02/16 2053  . potassium chloride SA (K-DUR,KLOR-CON) CR tablet 20 mEq  20 mEq Oral Daily Opyd, Ilene Qua, MD   20 mEq at 12/02/16 1132  . senna-docusate (Senokot-S) tablet 1 tablet  1 tablet Oral QHS PRN Opyd, Ilene Qua, MD      . sodium chloride flush (NS) 0.9 % injection 3 mL  3 mL Intravenous Q12H Opyd, Timothy S, MD      . sodium chloride flush (NS) 0.9 % injection 3 mL  3 mL Intravenous PRN Opyd, Ilene Qua, MD      . warfarin (COUMADIN) tablet 3 mg  3 mg Oral q1800 Caren Griffins, MD   3 mg at 12/02/16 1900  . Warfarin - Pharmacist Dosing Inpatient   Does not apply q1800 Absher, Julieta Bellini, [facility]         Discharge Medications: Please see discharge summary for a list of discharge medications.  Relevant Imaging Results:  Relevant Lab Results:   Additional Information SSN:  [id number]  [name],

## 2016-12-02 NOTE — Progress Notes (Signed)
PROGRESS NOTE  Amanda Richardson OVF:[id number] DOB: [birth year] DOA: 11/29/2016 PCP: Merrilee Seashore, MD   LOS: 2 days   Brief Narrative / Interim history: pleasant 83 year old female with history of chronic diastolic CHF, chronic A. fib on Coumadin, bullous pemphigoid, who was admitted on 11/29/2016 with left lower extremity cellulitis.   Assessment & Plan: Principal Problem:   Cellulitis Active Problems:   Hypertension   Chronic atrial fibrillation (HCC)   Hypothyroidism   Bullous pemphigoid   Chronic diastolic CHF (congestive heart failure) (HCC)   Left lower leg cellulitis -Did not meet criteria for sepsis on admission.  White count is normal.  She was on IV Zosyn, continue for today.   -She continues to improve, her swelling has gone down -Continue IV antibiotics for now, closely monitor renal function for toxicity  -Discussed with ID over the phone on 8/10, it would be possible to do Keflex twice daily or amoxicillin daily for suppression on discharge after she finishes current treatment  Chronic A. fib -Rate controlled today, continue Coumadin  Chronic diastolic CHF -Lower extremity swelling likely due to #1, improving with treatment with antibiotics. -Otherwise she appears euvolemic.  Continue Lasix, Coreg and lisinopril  Hypothyroidism -Continue Synthroid.  TSH last checked in September 2017 was normal at 3.5  DVT prophylaxis: On Coumadin Code Status: DNR Family Communication: Daughter at bedside Disposition Plan: Home versus SNF 2-3 days  Consultants:   None  Procedures:   None   Antimicrobials:  Zosyn 8/8 >>  Subjective: - no chest pain, shortness of breath, no abdominal pain, nausea or vomiting.   Objective: Vitals:   12/01/16 0457 12/01/16 1400 12/01/16 2151 12/02/16 0505  BP: (!) 146/72 (!) 148/70 132/60 138/70  Pulse: 84 86 89 77  Resp: 16 16 16 14   Temp: 98.2 F (36.8 C) 98 F (36.7 C) 98.7 F (37.1 C) 98.2 F (36.8 C)  TempSrc: Oral  Oral Oral Oral  SpO2: 96% 98% 97% 96%  Weight:      Height:        Intake/Output Summary (Last 24 hours) at 12/02/16 1239 Last data filed at 12/02/16 0150  Gross per 24 hour  Intake                0 ml  Output              900 ml  Net             -900 ml   Filed Weights   11/30/16 0000 11/30/16 1425  Weight: 54.9 kg (121 lb 0.5 oz) 54 kg (119 lb 0.8 oz)    Examination:  Vitals:   12/01/16 0457 12/01/16 1400 12/01/16 2151 12/02/16 0505  BP: (!) 146/72 (!) 148/70 132/60 138/70  Pulse: 84 86 89 77  Resp: 16 16 16 14   Temp: 98.2 F (36.8 C) 98 F (36.7 C) 98.7 F (37.1 C) 98.2 F (36.8 C)  TempSrc: Oral Oral Oral Oral  SpO2: 96% 98% 97% 96%  Weight:      Height:        Constitutional: NAD Eyes: lids and conjunctivae normal Respiratory: clear to auscultation bilaterally, no wheezing, no crackles. Normal respiratory effort. Cardiovascular: irregular, no murmurs / rubs / gallops. No LE edema. 2+ pedal pulses. Skin: Several bullae in the left lower extremity, some cellulitic changes with dimension, improving Neurologic: non focal    Data Reviewed: I have independently reviewed following labs and imaging studies   CBC:  Recent Labs Lab 11/29/16  1818 11/30/16 0119  WBC 9.4 8.2  NEUTROABS 6.6 5.3  HGB 11.5* 11.2*  HCT 34.2* 32.9*  MCV 89.8 91.4  PLT 174 093   Basic Metabolic Panel:  Recent Labs Lab 11/29/16 1818 11/30/16 0119 12/01/16 0544  NA 139 139 136  K 3.5 3.3* 3.1*  CL 104 106 103  CO2 27 24 25   GLUCOSE 111* 113* 88  BUN 20 19 16   CREATININE 1.06* 0.97 0.81  CALCIUM 8.7* 8.3* 8.0*   GFR: Estimated Creatinine Clearance: 38.6 mL/min (by C-G formula based on SCr of 0.81 mg/dL). Liver Function Tests:  Recent Labs Lab 11/29/16 1818  AST 27  ALT 13*  ALKPHOS 104  BILITOT 1.0  PROT 6.9  ALBUMIN 3.1*   No results for input(s): LIPASE, AMYLASE in the last 168 hours. No results for input(s): AMMONIA in the last 168 hours. Coagulation  Profile:  Recent Labs Lab 11/29/16 2157 11/30/16 0119 12/01/16 0544 12/02/16 0540  INR 3.98 3.99 2.95 2.37   Cardiac Enzymes: No results for input(s): CKTOTAL, CKMB, CKMBINDEX, TROPONINI in the last 168 hours. BNP (last 3 results) No results for input(s): PROBNP in the last 8760 hours. HbA1C: No results for input(s): HGBA1C in the last 72 hours. CBG: No results for input(s): GLUCAP in the last 168 hours. Lipid Profile: No results for input(s): CHOL, HDL, LDLCALC, TRIG, CHOLHDL, LDLDIRECT in the last 72 hours. Thyroid Function Tests: No results for input(s): TSH, T4TOTAL, FREET4, T3FREE, THYROIDAB in the last 72 hours. Anemia Panel: No results for input(s): VITAMINB12, FOLATE, FERRITIN, TIBC, IRON, RETICCTPCT in the last 72 hours. Urine analysis:    Component Value Date/Time   COLORURINE YELLOW 09/21/2016 [place] 09/21/2016 0638   LABSPEC 1.016 09/21/2016 0638   PHURINE 5.0 09/21/2016 0638   GLUCOSEU NEGATIVE 09/21/2016 0638   HGBUR MODERATE (A) 09/21/2016 0638   BILIRUBINUR NEGATIVE 09/21/2016 0638   KETONESUR NEGATIVE 09/21/2016 0638   PROTEINUR 100 (A) 09/21/2016 0638   UROBILINOGEN 4.0 (H) 08/04/2014 1608   NITRITE NEGATIVE 09/21/2016 0638   LEUKOCYTESUR NEGATIVE 09/21/2016 0638   Sepsis Labs: Invalid input(s): PROCALCITONIN, LACTICIDVEN  Recent Results (from the past 240 hour(s))  Culture, blood (routine x 2)     Status: None (Preliminary result)   Collection Time: 11/30/16 12:57 AM  Result Value Ref Range Status   Specimen Description BLOOD RIGHT ANTECUBITAL  Final   Special Requests IN PEDIATRIC BOTTLE Blood Culture adequate volume  Final   Culture   Final    NO GROWTH 2 DAYS Performed at [facility], [address], [place], [place] [address]    Report Status PENDING  Incomplete  Culture, blood (routine x 2)     Status: None (Preliminary result)   Collection Time: 11/30/16  1:00 AM  Result Value Ref Range Status   Specimen  Description BLOOD BLOOD LEFT HAND  Final   Special Requests IN PEDIATRIC BOTTLE Blood Culture adequate volume  Final   Culture   Final    NO GROWTH 2 DAYS Performed at [facility], [place] [address], [place], [place] [address]    Report Status PENDING  Incomplete      Radiology Studies: No results found.   Scheduled Meds: . acidophilus  1 capsule Oral QODAY  . carvedilol  6.25 mg Oral BID  . cholecalciferol  2,000 Units Oral QPM  . furosemide  40 mg Oral Q M,W,F  . latanoprost  1 drop Both Eyes QHS  . levothyroxine  50 mcg Oral Daily  . lisinopril  2.5 mg Oral Daily  . liver oil-zinc oxide   Topical BID  . multivitamin with minerals  1 tablet Oral QPM  . niacinamide  500 mg Oral BID WC  . potassium chloride SA  20 mEq Oral Daily  . sodium chloride flush  3 mL Intravenous Q12H  . warfarin  3 mg Oral q1800  . Warfarin - Pharmacist Dosing Inpatient   Does not apply q1800   Continuous Infusions: . sodium chloride 250 mL (11/30/16 0430)  . piperacillin-tazobactam (ZOSYN)  IV 3.375 g (12/02/16 1232)     Marzetta Board, MD, PhD [facility] Pager [phone]  If 7PM-7AM, please contact night-coverage [email or web address] Password [facility] 12/02/2016, 12:39 PM

## 2016-12-03 LAB — PROTIME-INR
INR: 2.42
PROTHROMBIN TIME: 26.8 s — AB (ref 11.4–15.2)

## 2016-12-03 NOTE — Progress Notes (Signed)
[place] for Warfarin Indication: atrial fibrillation  Allergies  Allergen Reactions  . Heparin Anaphylaxis  . Codeine Other (See Comments)    Unknown      Height: 5\' 4"  (162.6 cm) Weight: 119 lb 0.8 oz (54 kg) IBW/kg (Calculated) : 54.7  Vital Signs: Temp: 98.6 F (37 C) (08/12 0512) Temp Source: Oral (08/12 0512) BP: 144/72 (08/12 0512) Pulse Rate: 78 (08/12 0512)  Labs:  Recent Labs  12/01/16 0544 12/02/16 0540 12/03/16 0541  LABPROT 31.4* 26.3* 26.8*  INR 2.95 2.37 2.42  CREATININE 0.81  --   --     Estimated Creatinine Clearance: 38.6 mL/min (by C-G formula based on SCr of 0.81 mg/dL).   Assessment: 76 yoF presents with chronic leg redness and infection on chronic warfarin for A-fib with history of stroke. Home dosage 3 mg daily, unsure of when last taken. INR was supratherapeutic (3.98) on admission.   Pharmacy is consulted to assist warfarin while inpatient.  12/03/2016 INR therapeutic at 2.42 No bleeding reported Zosyn noted.  Broad-spectrum antibiotics sometimes increase INR response to warfarin.  Goal of Therapy:  INR 2 - 3   Plan:  continue home dose of 3 mg daily at 1800. Daily PT/INR while inpatient. - could change to 3x week INRs  Eudelia Bunch, Pharm.D. [phone] 12/03/2016 3:03 PM

## 2016-12-03 NOTE — Progress Notes (Signed)
Spoke with Einar Pheasant, [facility] nurse, about wound care. Margarita Grizzle aware the Xeroform adhered to wounds w/painful removal. See new orders entered by [facility] nurse for low air mattress and mepitel for wound coverage.

## 2016-12-03 NOTE — Progress Notes (Signed)
Dr. Cruzita Lederer in to see pt and remove dressings. Md entered another consult for [place] nurse to possibley see pt today.

## 2016-12-03 NOTE — Consult Note (Signed)
[facility] Nurse wound consult note Reason for Consult: Adherence of xeroform ordered on areas of bullous pemphigoid.  Seen by the [facility] Nurse team extender Amanda Richardson on 11/30/16. Today, patient's Bedside RN is also a WTA-C Amanda Richardson) and we discuss additional options for removal of groin dressing placed one week ago.  Ms. Amanda Richardson will soak silver hydrofiber with H2O2 (hydrogen peroxide) in attempt to loosen and remove atraumatically.  Additionally, today's wound care will be performed using silicone as a wound contact layer rather than xeroform.  Mepitel Amanda Richardson # [phone]) will not dry out or adhere.  The abdominal wounds will be dressed in a similar manner, using NS to cleanse, silicone wound contact layer (Mepitel) against the wound bed, and using an abd pad as a topper. On the LEs, we will secure this with Kerlix roll gauze, on the abdomen, we will allow just the abd to rest in place.  We will add a mattress replacement with low air loss feature to address her skin fragility. [place] nursing team will not follow routinely, but will remain available to this patient, the nursing and medical teams.  Please re-consult if needed. Thanks, Amanda Flakes, MSN, RN, Amanda Richardson, Amanda Richardson  Pager# [phone]

## 2016-12-03 NOTE — Progress Notes (Signed)
Able to remove remaining hardened "silver nitrate" dressing from rt groin areas x 2. Open areas pink with no bleeding/drainage noted. Areas covered with Mepitel.

## 2016-12-03 NOTE — Progress Notes (Signed)
Applied Hydrogen peroxide to dried area x 2 of "silver alginate" in rt groin twice this evening since dressing change done. 50% removal of old dressing noted at this time. Next shift RN aware.

## 2016-12-03 NOTE — Progress Notes (Signed)
Wound care done to lower left leg, left arm, as well as four open area spanning from mid abd to posterior rt flank. Rt groin area and new small areas located on lt posterior hip also treated. All compromised skin area cleansed with NS, pat dry and Mepitel applied as ordered. Kerlix lightly wrapped around lower left leg and left arm. Minimal pain noted. Hydrogen peroxide used as recommended for removal of two areas of "silver alginate" hardened in rt groin. Small results noted yet working. Reassurance provided. Low air mattress obtained.

## 2016-12-03 NOTE — Progress Notes (Signed)
PROGRESS NOTE  Amanda Richardson JME:[id number] DOB: [birth year] DOA: 11/29/2016 PCP: Merrilee Seashore, MD   LOS: 3 days   Brief Narrative / Interim history: pleasant 83 year old female with history of chronic diastolic CHF, chronic A. fib on Coumadin, bullous pemphigoid, who was admitted on 11/29/2016 with left lower extremity cellulitis.   Assessment & Plan: Principal Problem:   Cellulitis Active Problems:   Hypertension   Chronic atrial fibrillation (HCC)   Hypothyroidism   Bullous pemphigoid   Chronic diastolic CHF (congestive heart failure) (HCC)   Left lower leg cellulitis -Did not meet criteria for sepsis on admission.  White count is normal.  She was on IV Zosyn, still needs ongoing IV antibiotics.   -She continues to improve, her swelling has gone down almost completely, her cellulitic area has resolved for most part and evolves as expected -Discussed with ID over the phone on 8/10, it would be possible to do Keflex twice daily or amoxicillin daily for suppression on discharge after she finishes current treatment -Wound care to see today  Chronic A. fib -Rate controlled, continue Coumadin  Chronic diastolic CHF -Lower extremity swelling likely due to #1, improving with treatment with antibiotics. -Otherwise she appears euvolemic.  Continue Lasix, Coreg and lisinopril  Hypothyroidism -Continue Synthroid.  TSH last checked in September 2017 was normal at 3.5   DVT prophylaxis: On Coumadin Code Status: DNR Family Communication: Daughter at bedside Disposition Plan: Home versus SNF ~ 2 days  Consultants:   None  Procedures:   None   Antimicrobials:  Zosyn 8/8 >>  Subjective: - no chest pain, shortness of breath, no abdominal pain, nausea or vomiting.    Objective: Vitals:   12/01/16 2151 12/02/16 0505 12/02/16 2141 12/03/16 0512  BP: 132/60 138/70 (!) 119/53 (!) 144/72  Pulse: 89 77 74 78  Resp: 16 14 16 16   Temp: 98.7 F (37.1 C) 98.2 F (36.8 C)  98.1 F (36.7 C) 98.6 F (37 C)  TempSrc: Oral Oral Oral Oral  SpO2: 97% 96% 97% 96%  Weight:      Height:        Intake/Output Summary (Last 24 hours) at 12/03/16 1203 Last data filed at 12/03/16 0600  Gross per 24 hour  Intake             1020 ml  Output              450 ml  Net              570 ml   Filed Weights   11/30/16 0000 11/30/16 1425  Weight: 54.9 kg (121 lb 0.5 oz) 54 kg (119 lb 0.8 oz)    Examination:  Vitals:   12/01/16 2151 12/02/16 0505 12/02/16 2141 12/03/16 0512  BP: 132/60 138/70 (!) 119/53 (!) 144/72  Pulse: 89 77 74 78  Resp: 16 14 16 16   Temp: 98.7 F (37.1 C) 98.2 F (36.8 C) 98.1 F (36.7 C) 98.6 F (37 C)  TempSrc: Oral Oral Oral Oral  SpO2: 97% 96% 97% 96%  Weight:      Height:        Constitutional: NAD Respiratory: CTA biL Cardiovascular: irregular, no murmurs Skin: Several bullae in the left lower extremity, right hip  Data Reviewed: I have independently reviewed following labs and imaging studies   CBC:  Recent Labs Lab 11/29/16 1818 11/30/16 0119  WBC 9.4 8.2  NEUTROABS 6.6 5.3  HGB 11.5* 11.2*  HCT 34.2* 32.9*  MCV 89.8 91.4  PLT 174 993   Basic Metabolic Panel:  Recent Labs Lab 11/29/16 1818 11/30/16 0119 12/01/16 0544  NA 139 139 136  K 3.5 3.3* 3.1*  CL 104 106 103  CO2 27 24 25   GLUCOSE 111* 113* 88  BUN 20 19 16   CREATININE 1.06* 0.97 0.81  CALCIUM 8.7* 8.3* 8.0*   GFR: Estimated Creatinine Clearance: 38.6 mL/min (by C-G formula based on SCr of 0.81 mg/dL). Liver Function Tests:  Recent Labs Lab 11/29/16 1818  AST 27  ALT 13*  ALKPHOS 104  BILITOT 1.0  PROT 6.9  ALBUMIN 3.1*   No results for input(s): LIPASE, AMYLASE in the last 168 hours. No results for input(s): AMMONIA in the last 168 hours. Coagulation Profile:  Recent Labs Lab 11/29/16 2157 11/30/16 0119 12/01/16 0544 12/02/16 0540 12/03/16 0541  INR 3.98 3.99 2.95 2.37 2.42   Cardiac Enzymes: No results for input(s):  CKTOTAL, CKMB, CKMBINDEX, TROPONINI in the last 168 hours. BNP (last 3 results) No results for input(s): PROBNP in the last 8760 hours. HbA1C: No results for input(s): HGBA1C in the last 72 hours. CBG: No results for input(s): GLUCAP in the last 168 hours. Lipid Profile: No results for input(s): CHOL, HDL, LDLCALC, TRIG, CHOLHDL, LDLDIRECT in the last 72 hours. Thyroid Function Tests: No results for input(s): TSH, T4TOTAL, FREET4, T3FREE, THYROIDAB in the last 72 hours. Anemia Panel: No results for input(s): VITAMINB12, FOLATE, FERRITIN, TIBC, IRON, RETICCTPCT in the last 72 hours. Urine analysis:    Component Value Date/Time   COLORURINE YELLOW 09/21/2016 [place] 09/21/2016 0638   LABSPEC 1.016 09/21/2016 0638   PHURINE 5.0 09/21/2016 0638   GLUCOSEU NEGATIVE 09/21/2016 0638   HGBUR MODERATE (A) 09/21/2016 0638   BILIRUBINUR NEGATIVE 09/21/2016 0638   KETONESUR NEGATIVE 09/21/2016 0638   PROTEINUR 100 (A) 09/21/2016 0638   UROBILINOGEN 4.0 (H) 08/04/2014 1608   NITRITE NEGATIVE 09/21/2016 0638   LEUKOCYTESUR NEGATIVE 09/21/2016 0638   Sepsis Labs: Invalid input(s): PROCALCITONIN, LACTICIDVEN  Recent Results (from the past 240 hour(s))  Culture, blood (routine x 2)     Status: None (Preliminary result)   Collection Time: 11/30/16 12:57 AM  Result Value Ref Range Status   Specimen Description BLOOD RIGHT ANTECUBITAL  Final   Special Requests IN PEDIATRIC BOTTLE Blood Culture adequate volume  Final   Culture   Final    NO GROWTH 3 DAYS Performed at [facility], [address] [place], [place] [address]    Report Status PENDING  Incomplete  Culture, blood (routine x 2)     Status: None (Preliminary result)   Collection Time: 11/30/16  1:00 AM  Result Value Ref Range Status   Specimen Description BLOOD BLOOD LEFT HAND  Final   Special Requests IN PEDIATRIC BOTTLE Blood Culture adequate volume  Final   Culture   Final    NO GROWTH 3  DAYS Performed at [facility], [place] [address] [place], [place] [address]    Report Status PENDING  Incomplete      Radiology Studies: No results found.   Scheduled Meds: . acidophilus  1 capsule Oral QODAY  . carvedilol  6.25 mg Oral BID  . cholecalciferol  2,000 Units Oral QPM  . furosemide  40 mg Oral Q M,W,F  . latanoprost  1 drop Both Eyes QHS  . levothyroxine  50 mcg Oral Daily  . lisinopril  2.5 mg Oral Daily  . liver oil-zinc oxide   Topical  BID  . multivitamin with minerals  1 tablet Oral QPM  . niacinamide  500 mg Oral BID WC  . potassium chloride SA  20 mEq Oral Daily  . sodium chloride flush  3 mL Intravenous Q12H  . warfarin  3 mg Oral q1800  . Warfarin - Pharmacist Dosing Inpatient   Does not apply q1800   Continuous Infusions: . sodium chloride 250 mL (12/03/16 0509)  . piperacillin-tazobactam (ZOSYN)  IV 3.375 g (12/03/16 6578)     Marzetta Board, MD, PhD [facility] Pager [phone]  If 7PM-7AM, please contact night-coverage [email or web address] Password [facility] 12/03/2016, 12:03 PM

## 2016-12-04 LAB — PROTIME-INR
INR: 2.6
Prothrombin Time: 28.4 seconds — ABNORMAL HIGH (ref 11.4–15.2)

## 2016-12-04 MED ORDER — WARFARIN SODIUM 2 MG PO TABS
2.0000 mg | ORAL_TABLET | Freq: Once | ORAL | Status: AC
  Administered 2016-12-04: 2 mg via ORAL
  Filled 2016-12-04: qty 1

## 2016-12-04 NOTE — Progress Notes (Addendum)
LCSW following for disposition:  New SNF placement  Discussed cased with covering weekend CSW. Insurance authorization has been submitted and under review/will follow up with family regarding bed offers. Spoke with [facility] who continues to work on [place], requested additional clinicals and LCSW provided what was needed.  Updated MD.  Plan: SNF at [place], currently awaiting insurance authorization and medical stability. Bed offers have been given to [name] (daughter) and they are discussing with patient and other family members.  Wanting only a private room. Reported that [facility] that has one currently.  [name] called back and made LCSW aware that they would like to accept bed at [facility]. LCSW notified facility. Will continue to follow.  Lane Hacker, MSW Clinical Social Work: [employer] Coverage for :  [phone]

## 2016-12-04 NOTE — Progress Notes (Signed)
Physical Therapy Treatment Patient Details Name: Amanda Richardson MRN: [id number] DOB: 1923/09/23 Today's Date: 12/04/2016    History of Present Illness Amanda Richardson is a 83 y.o. female with medical history significant of hypertension, CAD, CABG, dCHF, bullous pemphigoid, thrombocytopenia, stroke, s/p of nephrectomy, ischemic bowel disease, mitral valve replacement with mechanical valves 2001 on coumadin, DVT, atrial fibrillation, hypothyroidism, who presents with fever, confusion and leg pain. Dx of cellulitis.    PT Comments    Pt progressing well with mobility, she ambulated 68' with RW. Distance limited by LLE pain and fatigue.  Follow Up Recommendations  SNF;Supervision/Assistance - 24 hour (patient states going home)     Equipment Recommendations  None recommended by PT    Recommendations for Other Services       Precautions / Restrictions Precautions Precautions: Fall Precaution Comments: left leg very painfull, wounds all over, R anticubital space  with painful skin tear. Restrictions Weight Bearing Restrictions: No    Mobility  Bed Mobility Overal bed mobility: Needs Assistance Bed Mobility: Supine to Sit     Supine to sit: Mod assist;HOB elevated     General bed mobility comments: assist with legs to take care not to  shear over bed edge. use of bed pad to scoot to edge.  Transfers Overall transfer level: Needs assistance Equipment used: Rolling walker (2 wheeled) Transfers: Sit to/from Stand Sit to Stand: Mod assist         General transfer comment: mod A to rise  Ambulation/Gait Ambulation/Gait assistance: Min guard Ambulation Distance (Feet): 36 Feet Assistive device: Rolling walker (2 wheeled) Gait Pattern/deviations: Step-through pattern;Decreased step length - right;Decreased step length - left   Gait velocity interpretation: Below normal speed for age/gender General Gait Details: steady with RW, no LOB, distance limited by LLE  pain/fatigue   Stairs            Wheelchair Mobility    Modified Rankin (Stroke Patients Only)       Balance Overall balance assessment: Needs assistance Sitting-balance support: Feet supported;Bilateral upper extremity supported Sitting balance-Leahy Scale: Fair     Standing balance support: During functional activity;Bilateral upper extremity supported Standing balance-Leahy Scale: Poor Standing balance comment: relies on BUE support                            Cognition Arousal/Alertness: Awake/alert Behavior During Therapy: WFL for tasks assessed/performed Overall Cognitive Status: Within Functional Limits for tasks assessed                                        Exercises      General Comments        Pertinent Vitals/Pain Faces Pain Scale: Hurts little more Pain Location: LLE with movement Pain Descriptors / Indicators: Sore Pain Intervention(s): Limited activity within patient's tolerance;Monitored during session;Repositioned    Home Living                      Prior Function            PT Goals (current goals can now be found in the care plan section) Acute Rehab PT Goals Patient Stated Goal: to go home, not have leg pain PT Goal Formulation: With patient/family Time For Goal Achievement: 12/14/16 Potential to Achieve Goals: Fair Progress towards PT goals: Progressing toward goals    Frequency  Min 3X/week      PT Plan Current plan remains appropriate    Co-evaluation              AM-PAC PT "6 Clicks" Daily Activity  Outcome Measure  Difficulty turning over in bed (including adjusting bedclothes, sheets and blankets)?: Total Difficulty moving from lying on back to sitting on the side of the bed? : Total Difficulty sitting down on and standing up from a chair with arms (e.g., wheelchair, bedside commode, etc,.)?: Total Help needed moving to and from a bed to chair (including a  wheelchair)?: A Lot Help needed walking in hospital room?: A Little Help needed climbing 3-5 steps with a railing? : Total 6 Click Score: 9    End of Session Equipment Utilized During Treatment: Gait belt Activity Tolerance: Patient limited by pain Patient left: in chair;with call bell/phone within reach;with chair alarm set Nurse Communication: Mobility status PT Visit Diagnosis: Difficulty in walking, not elsewhere classified (R26.2);Pain Pain - Right/Left: Left Pain - part of body: Leg     Time: 1308-6578 PT Time Calculation (min) (ACUTE ONLY): 18 min  Charges:  $Gait Training: 8-22 mins                    G Codes:       }   Amanda Richardson 12/04/2016, 12:56 PM [phone]

## 2016-12-04 NOTE — Care Management Important Message (Signed)
Important Message  Patient Details  Name: Amanda Richardson MRN: [id number] Date of Birth: 1923/09/14   Medicare Important Message Given:  Yes    Kerin Salen 12/04/2016, 11:39 AMImportant Message  Patient Details  Name: Amanda Richardson MRN: [id number] Date of Birth: 06/06/1923   Medicare Important Message Given:  Yes    Kerin Salen 12/04/2016, 11:39 AM

## 2016-12-04 NOTE — Progress Notes (Signed)
[place] for Warfarin Indication: atrial fibrillation  Allergies  Allergen Reactions  . Heparin Anaphylaxis  . Codeine Other (See Comments)    Unknown      Height: 5\' 4"  (162.6 cm) Weight: 119 lb 0.8 oz (54 kg) IBW/kg (Calculated) : 54.7  Vital Signs: Temp: 98.8 F (37.1 C) (08/13 0525) Temp Source: Oral (08/13 0525) BP: 113/71 (08/13 0525) Pulse Rate: 80 (08/13 0525)  Labs:  Recent Labs  12/02/16 0540 12/03/16 0541 12/04/16 0528  LABPROT 26.3* 26.8* 28.4*  INR 2.37 2.42 2.60    Estimated Creatinine Clearance: 38.6 mL/min (by C-G formula based on SCr of 0.81 mg/dL).   Assessment: 37 yoF presents with chronic leg redness and infection on chronic warfarin for A-fib with history of stroke. Home dosage 3 mg daily, unsure of when last taken. INR was supratherapeutic (3.98) on admission.   Pharmacy is consulted to assist warfarin while inpatient.  Today, 12/04/2016: - INR is therapeutic at 2.60 and appears to be trending up - no bleeding documented - heart healthy diet - drug-drug intxns: being on abx (zosyn) can make patient more sensitive to warfarin  Goal of Therapy:  INR 2 - 3   Plan:  - warfarin 2 mg daily at 1800. - Daily INR - monitor for s/s bleeding  Dia Sitter, PharmD, BCPS 12/04/2016 12:40 PM

## 2016-12-04 NOTE — Progress Notes (Signed)
PT Cancellation Note  Patient Details Name: ZIVA NUNZIATA MRN: [id number] DOB: [birth year]   Cancelled Treatment:    Reason Eval/Treat Not Completed: Other (comment) (family requested PT check back after pt has had her leg wrapped. Will follow. )   [name] 12/04/2016, 10:26 AM [phone]

## 2016-12-04 NOTE — Progress Notes (Signed)
PROGRESS NOTE  Amanda Richardson QZE:[id number] DOB: July 18, 1923 DOA: 11/29/2016 PCP: Merrilee Seashore, MD   LOS: 4 days   Brief Narrative / Interim history: 83 yo female with past medical history significant for chronic diastolic CHF, chronic Afib on anticoagulation (warfarin), and bullous pemphigoid presented to the [facility] ED with c/o increased erythema and tenderness to palpation of the lower extremities with fever the previous night. In the ED, she was found to be afebrile with a reassuring lactic acid of 1.22. She was admitted for LLE cellulitis and received IV Zosyn. Of note, she has had a recent prior hospitalization 2 months ago for b/l lower leg cellulitis that improved with antibiotics and has been followed by an outpatient wound care nurse.   Assessment & Plan: Principal Problem:   Cellulitis Active Problems:   Hypertension   Chronic atrial fibrillation (HCC)   Hypothyroidism   Bullous pemphigoid   Chronic diastolic CHF (congestive heart failure) (HCC)  Left lower leg cellulitis without sepsis -H/o bullous pemphigoid with recurrent infection and blistering.  -Not septic at admit: no AMS, afebrile, nml WBC, not tachycardic/tachypnic, SBP>100. -Wound care consulted and caring for lower left eg, left arm, and 4 open areas from mid abd to posterior R flank, R groin, and R posterior hip. Lower left leg with one lesion that may soon burst.  -Consider chronic suppression with low dose keflex vs amoxicillin to prevent future infection with blistering. -Continue IV Zosyn if patient stays another day. Switch to PO if discharge patient. [Currently working with Education officer, museum regarding SNF at discharge.]  Chronic A. Fib -Rate controlled on chronic anticoagulation. CHADS2VASc = 8 [Age X2, Female, CHF, HTN, Stroke, Vascular Dz] -Continue beta-blockers and coumadin per pharmacy.  Chronic Diastolic CHF -Patient still appears euvolemic.  -Continue home lasix, coreg,  lisinopril.  Hypothyroidism -Continue Synthroid.  DVT prophylaxis: Coumadin Code Status: DNR Family Communication: Daughter at bedside Disposition Plan: Home vs. SNF.  Consultants:   None  Procedures:   None  Antimicrobials:  Zosyn 8/8>>>>  Subjective: Patient reports that the new dressing helped, because she notes less edema and erythema. She still reports some b/l pain to both her heels, possibly d/t pressure of being in bed. She also states that her left leg has been a lot more tender over the weekend but thinks that it is improving and seems happy with the most recent wound dressing type. She does not seem concerned about her right hip or left arm lesions. She still reports a healthy appetite without n/v/d. She did not sleep well last night due to the bed pump / air matress "white noise." She denies CP, SOB, f/c. She does not report abdominal pain, dysuria, diarrhea, or constipation.   Objective: Vitals:   12/03/16 0512 12/03/16 1400 12/03/16 2140 12/04/16 0525  BP: (!) 144/72 114/65 (!) 125/59 113/71  Pulse: 78 60 73 80  Resp: 16 16 16 16   Temp: 98.6 F (37 C) 97.6 F (36.4 C) 98.3 F (36.8 C) 98.8 F (37.1 C)  TempSrc: Oral Oral Oral Oral  SpO2: 96% 99% 96% 96%  Weight:      Height:        Intake/Output Summary (Last 24 hours) at 12/04/16 1343 Last data filed at 12/04/16 1100  Gross per 24 hour  Intake              980 ml  Output              600 ml  Net  380 ml   Filed Weights   11/30/16 0000 11/30/16 1425  Weight: 54.9 kg (121 lb 0.5 oz) 54 kg (119 lb 0.8 oz)    Examination:  Vitals:   12/03/16 0512 12/03/16 1400 12/03/16 2140 12/04/16 0525  BP: (!) 144/72 114/65 (!) 125/59 113/71  Pulse: 78 60 73 80  Resp: 16 16 16 16   Temp: 98.6 F (37 C) 97.6 F (36.4 C) 98.3 F (36.8 C) 98.8 F (37.1 C)  TempSrc: Oral Oral Oral Oral  SpO2: 96% 99% 96% 96%  Weight:      Height:        Constitutional: NAD.Thin woman. Eating  breakfast. Eyes: lids and conjunctivae normal ENMT: Mucous membranes are moist. No oropharyngeal exudates Neck: normal, supple, no masses Respiratory: clear to auscultation bilaterally, no wheezing, no crackles. Normal respiratory effort. No accessory muscle use.  Cardiovascular: IR,IR, no murmurs / rubs / gallops. No LE edema. 2+ pedal pulses. Abdomen: no tenderness..  Musculoskeletal: visually appreciated kyphosis, no clubbing / cyanosis.  Skin: blisters and lesions to LLE with improving erythema and edema, extending from below malleolus to level of mid shin. Skin lesions located at right hip / flank and left arm dressed and with improving erythema. Psychiatric: Normal judgment and insight. Alert and oriented x 3. Normal Richardson.    Data Reviewed: I have independently reviewed following labs and imaging studies   CBC:  Recent Labs Lab 11/29/16 1818 11/30/16 0119  WBC 9.4 8.2  NEUTROABS 6.6 5.3  HGB 11.5* 11.2*  HCT 34.2* 32.9*  MCV 89.8 91.4  PLT 174 354   Basic Metabolic Panel:  Recent Labs Lab 11/29/16 1818 11/30/16 0119 12/01/16 0544  NA 139 139 136  K 3.5 3.3* 3.1*  CL 104 106 103  CO2 27 24 25   GLUCOSE 111* 113* 88  BUN 20 19 16   CREATININE 1.06* 0.97 0.81  CALCIUM 8.7* 8.3* 8.0*   GFR: Estimated Creatinine Clearance: 38.6 mL/min (by C-G formula based on SCr of 0.81 mg/dL). Liver Function Tests:  Recent Labs Lab 11/29/16 1818  AST 27  ALT 13*  ALKPHOS 104  BILITOT 1.0  PROT 6.9  ALBUMIN 3.1*   No results for input(s): LIPASE, AMYLASE in the last 168 hours. No results for input(s): AMMONIA in the last 168 hours. Coagulation Profile:  Recent Labs Lab 11/30/16 0119 12/01/16 0544 12/02/16 0540 12/03/16 0541 12/04/16 0528  INR 3.99 2.95 2.37 2.42 2.60   Cardiac Enzymes: No results for input(s): CKTOTAL, CKMB, CKMBINDEX, TROPONINI in the last 168 hours. BNP (last 3 results) No results for input(s): PROBNP in the last 8760 hours. HbA1C: No  results for input(s): HGBA1C in the last 72 hours. CBG: No results for input(s): GLUCAP in the last 168 hours. Lipid Profile: No results for input(s): CHOL, HDL, LDLCALC, TRIG, CHOLHDL, LDLDIRECT in the last 72 hours. Thyroid Function Tests: No results for input(s): TSH, T4TOTAL, FREET4, T3FREE, THYROIDAB in the last 72 hours. Anemia Panel: No results for input(s): VITAMINB12, FOLATE, FERRITIN, TIBC, IRON, RETICCTPCT in the last 72 hours. Urine analysis:    Component Value Date/Time   COLORURINE YELLOW 09/21/2016 [place] 09/21/2016 0638   LABSPEC 1.016 09/21/2016 0638   PHURINE 5.0 09/21/2016 0638   GLUCOSEU NEGATIVE 09/21/2016 0638   HGBUR MODERATE (A) 09/21/2016 0638   BILIRUBINUR NEGATIVE 09/21/2016 0638   KETONESUR NEGATIVE 09/21/2016 0638   PROTEINUR 100 (A) 09/21/2016 0638   UROBILINOGEN 4.0 (H) 08/04/2014 1608   NITRITE NEGATIVE 09/21/2016 5625  LEUKOCYTESUR NEGATIVE 09/21/2016 6546   Sepsis Labs: Invalid input(s): PROCALCITONIN, LACTICIDVEN  Recent Results (from the past 240 hour(s))  Culture, blood (routine x 2)     Status: None (Preliminary result)   Collection Time: 11/30/16 12:57 AM  Result Value Ref Range Status   Specimen Description BLOOD RIGHT ANTECUBITAL  Final   Special Requests IN PEDIATRIC BOTTLE Blood Culture adequate volume  Final   Culture   Final    NO GROWTH 3 DAYS Performed at [facility], [address] [place], [place] [address]    Report Status PENDING  Incomplete  Culture, blood (routine x 2)     Status: None (Preliminary result)   Collection Time: 11/30/16  1:00 AM  Result Value Ref Range Status   Specimen Description BLOOD BLOOD LEFT HAND  Final   Special Requests IN PEDIATRIC BOTTLE Blood Culture adequate volume  Final   Culture   Final    NO GROWTH 3 DAYS Performed at [facility], [place] [address] [place], [place] [address]    Report Status PENDING  Incomplete      Radiology Studies: No results  found.   Scheduled Meds: . acidophilus  1 capsule Oral QODAY  . carvedilol  6.25 mg Oral BID  . cholecalciferol  2,000 Units Oral QPM  . furosemide  40 mg Oral Q M,W,F  . latanoprost  1 drop Both Eyes QHS  . levothyroxine  50 mcg Oral Daily  . lisinopril  2.5 mg Oral Daily  . liver oil-zinc oxide   Topical BID  . multivitamin with minerals  1 tablet Oral QPM  . niacinamide  500 mg Oral BID WC  . potassium chloride SA  20 mEq Oral Daily  . sodium chloride flush  3 mL Intravenous Q12H  . warfarin  2 mg Oral ONCE-1800  . Warfarin - Pharmacist Dosing Inpatient   Does not apply q1800   Continuous Infusions: . sodium chloride 250 mL (12/03/16 0509)  . piperacillin-tazobactam (ZOSYN)  IV 3.375 g (12/04/16 1321)     Amanda Richardson, [place]  12/04/2016 1:43 PM   Attending MD note  Patient was seen, examined,treatment plan was discussed with the PA-S.  I have personally reviewed the clinical findings, lab, imaging studies and management of this patient in detail. I agree with the documentation, as recorded by the PA-S  Patient is   83 year old female with history of chronic diastolic CHF, chronic A. fib on Coumadin, bullous pemphigoid, who was admitted on 11/29/2016 with left lower extremity cellulitis  On Exam: Gen. exam: Awake, alert, not in any distress CVS: irregular Skin: cellulitis improving  Plan  Left lower leg cellulitis -Did not meet criteria for sepsis on admission. White count is normal. She was on IV Zosyn, still needs ongoing IV antibiotics.  -She continues to improve, her swelling has gone down almost completely, her cellulitic area has resolved for most part and evolves as expected -Discussed with ID over the phone on 8/10, it would be possible to do Keflex twice daily or amoxicillin daily for suppression on discharge after she finishes current treatment -continue IV antibiotics for today  Chronic A. fib -Rate controlled, continue Coumadin  Chronic  diastolic CHF -Lower extremity swelling likely due to #1, improving with treatment with antibiotics. -Otherwise she appears euvolemic. Continue Lasix, Coreg and lisinopril  Hypothyroidism -Continue Synthroid. TSH last checked in September 2017 was normal at 3.5   Rest as above  Amanda Majchrzak M. Cruzita Lederer, MD [facility] [phone]  If 7PM-7AM, please contact night-coverage [email or web address] Password [email or web address]

## 2016-12-05 LAB — CULTURE, BLOOD (ROUTINE X 2)
Culture: NO GROWTH
Culture: NO GROWTH
Special Requests: ADEQUATE
Special Requests: ADEQUATE

## 2016-12-05 LAB — PROTIME-INR
INR: 2.48
Prothrombin Time: 27.3 seconds — ABNORMAL HIGH (ref 11.4–15.2)

## 2016-12-05 MED ORDER — AMOXICILLIN 500 MG PO CAPS: 500.0000 mg | ORAL_CAPSULE | Freq: Every day | ORAL | Status: DC

## 2016-12-05 MED ORDER — BISACODYL 5 MG PO TBEC: 5.0000 mg | DELAYED_RELEASE_TABLET | Freq: Every day | ORAL | 0 refills | Status: AC | PRN

## 2016-12-05 MED ORDER — AMOXICILLIN-POT CLAVULANATE 875-125 MG PO TABS: 1.0000 | ORAL_TABLET | Freq: Two times a day (BID) | ORAL | Status: AC

## 2016-12-05 MED ORDER — HYDROCODONE-ACETAMINOPHEN 5-325 MG PO TABS: 1.0000 | ORAL_TABLET | ORAL | 0 refills | Status: DC | PRN

## 2016-12-05 MED ORDER — WARFARIN SODIUM 3 MG PO TABS
3.0000 mg | ORAL_TABLET | Freq: Once | ORAL | Status: DC
  Filled 2016-12-05: qty 1

## 2016-12-05 NOTE — Progress Notes (Signed)
Pt noted to be sitting on side of the bed. Reports that the person in "the next room is scratching on the wall trying to get through." Pt oriented to date, time and place. Sound from suction canister noted to be the noise that patient described as "scratching", suction tubing had become disconnected from external catheter.Pt cleansed and dried, bed linens changed, dressings changed to right hip, abdominal region and left arm, completed as instructed by day shift off going nurse. Pt tolerated dressing changes with minimal discomfort. Wound beds healing, no bleeding or drainage noted on any of the dressings that were replaced.

## 2016-12-05 NOTE — Progress Notes (Signed)
Pt's dressings were changed prior to discharge.  A new wound was found on the left hip and was dressed with mepitel.  Pt was also able to have a large bowel movement before leaving.

## 2016-12-05 NOTE — Progress Notes (Signed)
[place] for Warfarin Indication: atrial fibrillation  Allergies  Allergen Reactions  . Heparin Anaphylaxis  . Codeine Other (See Comments)    Unknown      Height: 5\' 4"  (162.6 cm) Weight: 119 lb 0.8 oz (54 kg) IBW/kg (Calculated) : 54.7  Vital Signs: Temp: 98.4 F (36.9 C) (08/14 0602) Temp Source: Oral (08/14 0602) BP: 135/73 (08/14 0602) Pulse Rate: 80 (08/14 0602)  Labs:  Recent Labs  12/03/16 0541 12/04/16 0528 12/05/16 0536  LABPROT 26.8* 28.4* 27.3*  INR 2.42 2.60 2.48    Estimated Creatinine Clearance: 38.6 mL/min (by C-G formula based on SCr of 0.81 mg/dL).   Assessment: 39 yoF presents with chronic leg redness and infection on chronic warfarin for A-fib with history of stroke. Home dosage 3 mg daily, unsure of when last taken. INR was supratherapeutic (3.98) on admission.   Pharmacy is consulted to assist warfarin while inpatient.  Today, 12/05/2016: - INR is therapeutic but down from 2.60 to 2.48 today after dose reduced to 2 mg yesterday  - no bleeding documented - heart healthy diet - drug-drug intxns: being on abx (zosyn) can make patient more sensitive to warfarin - plan to discharge to SNF today  Goal of Therapy:  INR 2 - 3   Plan:  - warfarin 3 mg x1 at 1800 if patient does not get discharged to SNF today - Daily INR - monitor for s/s bleeding  Dia Sitter, PharmD, BCPS 12/05/2016 1:03 PM

## 2016-12-05 NOTE — Clinical Social Work Placement (Signed)
   CLINICAL SOCIAL WORK PLACEMENT  NOTE  Date:  12/05/2016  Patient Details  Name: Amanda Richardson MRN: [id number] Date of Birth: Mar 09, 1924  Clinical Social Work is seeking post-discharge placement for this patient at the [place] level of care (*CSW will initial, date and re-position this form in  chart as items are completed):  Yes   Patient/family provided with [facility] Work Department's list of facilities offering this level of care within the geographic area requested by the patient (or if unable, by the patient's family).  Yes   Patient/family informed of their freedom to choose among providers that offer the needed level of care, that participate in Medicare, Medicaid or managed care program needed by the patient, have an available bed and are willing to accept the patient.  Yes   Patient/family informed of [facility]'s ownership interest in [facility] and [facility], as well as of the fact that they are under no obligation to receive care at these facilities.  PASRR submitted to EDS on       PASRR number received on       Existing PASRR number confirmed on 12/02/16     FL2 transmitted to all facilities in geographic area requested by pt/family on 12/02/16     FL2 transmitted to all facilities within larger geographic area on       Patient informed that his/her managed care company has contracts with or will negotiate with certain facilities, including the following:   [facility] Medicare)     Yes   Patient/family informed of bed offers received.  Patient chooses bed at [facility]     Physician recommends and patient chooses bed at [facility]    Patient to be transferred to [facility] on 12/05/16.  Patient to be transferred to facility by Ambulance [facility])     Patient family notified on 12/05/16 of transfer.  Name of family member notified:  [name] and Eden Please prepare prescriptions, Please sign FL2,  Please sign DNR     Additional Comment:    _______________________________________________ Lilly [place], LCSW 12/05/2016, 10:23 AM

## 2016-12-05 NOTE — Discharge Summary (Signed)
Physician Discharge Summary  Amanda Richardson:[id number] DOB: 04/03/1924 DOA: 11/29/2016  PCP: Merrilee Seashore, MD  Admit date: 11/29/2016 Discharge date: 12/05/2016  Admitted From: home Disposition:  SNF  Recommendations for Outpatient Follow-up:  1. Continue Augmentin for 8 more days 2. After Augmentin is completed, continue low dose Amoxicillin for infection suppression   Home Health: none Equipment/Devices: none  Discharge Condition: stable CODE STATUS: DNR Diet recommendation: regular  HPI: Per Dr. Myna Hidalgo, Amanda Richardson is a 83 y.o. female with medical histoy significant for chronic diastolic CHF, chronic atrial fibrillation on warfarin, and bullous pemphigoid, now presenting to the emergency department for evaluation of increased redness and tenderness to the lower extremities, as well as a fever last night. Patient is accompanied by family who assist with the history. She reportedly been in her usual state, receiving care from a home health nurse for chronic bilateral lower extremity wounds that are attributed to bolus pemphigoid. She has had a secondary cellulitis of the lower extremities previously and has been septic due to this in the past. Home health nurse noted worsening and redness and edema in the lower extremities over the past couple days and red streaking proximally was noted today. The patient reportedly had a fever to 101F last night. She has recently been on oral doxycycline. ED Course: Upon arrival to the ED, patient is found to be afebrile, saturating well on room air, and with vitals stable. Chemistry panels unremarkable and CBC is notable for a stable normocytic anemia with hemoglobin of 11.5. Lactic acid is reassuring at 1.22. Patient was treated with empiric Zosyn in the ED. She remained hemodynamically stable, and no appreciable respiratory distress, and will be observed on the medical-surgical unit for ongoing evaluation and management of lower extremity  cellulitis with fever at home.  Hospital Course: Discharge Diagnoses:  Principal Problem:   Cellulitis Active Problems:   Hypertension   Chronic atrial fibrillation (HCC)   Hypothyroidism   Bullous pemphigoid   Chronic diastolic CHF (congestive heart failure) (HCC)   Left lower leg cellulitis without sepsis - patient was admitted to the hospital with H/o bullous pemphigoid and with recurrent cellulitis on LLE and blistering. Not septic at admit: no AMS, afebrile, nml WBC, not tachycardic/tachypnic, SBP>100. She was started on IV antibiotics with Zozyn for her cellulitis. She improved, her swelling / erythema have improved. Wound care was consulted. Continue wound care as below Wound care RN note "[place] Nurse wound consult note Reason for Consult: Adherence of xeroform ordered on areas of bullous pemphigoid.  Seen by the [facility] Nurse team extender Letitia Caul on 11/30/16. Today, patient's Bedside RN is also a WTA-C Alphonzo Lemmings) and we discuss additional options for removal of groin dressing placed one week ago.  Ms. Tasia Catchings will soak silver hydrofiber with H2O2 (hydrogen peroxide) in attempt to loosen and remove atraumatically.  Additionally, today's wound care will be performed using silicone as a wound contact layer rather than xeroform.  Mepitel Kellie Simmering # [phone]) will not dry out or adhere.  The abdominal wounds will be dressed in a similar manner, using NS to cleanse, silicone wound contact layer (Mepitel) against the wound bed, and using an abd pad as a topper. On the LEs, we will secure this with Kerlix roll gauze, on the abdomen, we will allow just the abd to rest in place."  Patient was maintained on Zosyn, cellulitis improved and she will be transitioned to Augmentin for 8 more days to complete a 14 day course.  Following that recommend low dose antibiotic suppressive treatment with Amoxicillin. If patient will not tolerate Keflex is an additional option. Chronic A. Fib -rate controlled.  Continue Coumadin. Closely monitor INR while on antibiotics.  Chronic Diastolic CHF -euvolemic on d/c, continue home medications Hypothyroidism -Continue Synthroid.   Discharge Instructions   Allergies as of 12/05/2016      Reactions   Heparin Anaphylaxis   Codeine Other (See Comments)   Unknown      Medication List    TAKE these medications   acetaminophen 325 MG tablet Commonly known as:  TYLENOL Take 2 tablets (650 mg total) by mouth every 6 (six) hours as needed for mild pain (or Fever >/= 101).   amoxicillin 500 MG capsule Commonly known as:  AMOXIL Take 1 capsule (500 mg total) by mouth daily. Start after finishing Augmenting   amoxicillin-clavulanate 875-125 MG tablet Commonly known as:  AUGMENTIN Take 1 tablet by mouth 2 (two) times daily.   bisacodyl 5 MG EC tablet Commonly known as:  DULCOLAX Take 1 tablet (5 mg total) by mouth daily as needed for moderate constipation.   calcium-vitamin D 500-200 MG-UNIT tablet Commonly known as:  OSCAL WITH D Take 1 tablet by mouth every evening.   carvedilol 6.25 MG tablet Commonly known as:  COREG Take 1 tablet (6.25 mg total) by mouth 2 (two) times daily.   Cholecalciferol 2000 units Caps Take 1 capsule by mouth every evening.   clobetasol ointment 0.05 % Commonly known as:  TEMOVATE Apply 1 application topically 2 (two) times daily as needed (blisters for bulous pemphigoid).   furosemide 40 MG tablet Commonly known as:  LASIX Take 1 tablet (40 mg total) by mouth every Monday, Wednesday, and Friday. May change to daily if bilateral lower legs start to have fluids retention.   HYDROcodone-acetaminophen 5-325 MG tablet Commonly known as:  NORCO/VICODIN Take 1-2 tablets by mouth every 4 (four) hours as needed for moderate pain.   KLOR-CON M20 20 MEQ tablet Generic drug:  potassium chloride SA Take 1 tablet by mouth daily.   latanoprost 0.005 % ophthalmic solution Commonly known as:  XALATAN Place 1 drop into  both eyes at bedtime.   levothyroxine 50 MCG tablet Commonly known as:  SYNTHROID, LEVOTHROID Take 50 mcg by mouth daily.   lisinopril 2.5 MG tablet Commonly known as:  ZESTRIL Take 1 tablet (2.5 mg total) by mouth daily.   multivitamin with minerals Tabs tablet Take 1 tablet by mouth every evening.   niacinamide 500 MG tablet Take 500 mg by mouth 2 (two) times daily with a meal.   PROBIOTIC PO Take 1 capsule by mouth every other day.   timolol 0.5 % ophthalmic solution Commonly known as:  BETIMOL Place 1 drop into both eyes daily.   warfarin 3 MG tablet Commonly known as:  COUMADIN Take 3 mg by mouth daily.      Contact information for after-discharge care    Destination    [facility] .   Specialty:  Skilled Nursing Facility Contact information: [place] [address] [phone]             Allergies  Allergen Reactions  . Heparin Anaphylaxis  . Codeine Other (See Comments)    Unknown     Consultations:  None   Procedures/Studies   No results found.   Subjective: - no chest pain, shortness of breath, no abdominal pain, nausea or vomiting.   Discharge Exam: Vitals:   12/04/16  2200 12/05/16 0602  BP: (!) 119/57 135/73  Pulse: 69 80  Resp: 16 16  Temp: 98.9 F (37.2 C) 98.4 F (36.9 C)  SpO2: 97% 95%   Vitals:   12/04/16 0525 12/04/16 1447 12/04/16 2200 12/05/16 0602  BP: 113/71 132/67 (!) 119/57 135/73  Pulse: 80 71 69 80  Resp: 16 16 16 16   Temp: 98.8 F (37.1 C) 98.2 F (36.8 C) 98.9 F (37.2 C) 98.4 F (36.9 C)  TempSrc: Oral Oral Oral Oral  SpO2: 96% 100% 97% 95%  Weight:      Height:        General: Pt is alert, awake, not in acute distress Cardiovascular: irregular Respiratory: CTA bilaterally, no wheezing, no rhonchi   The results of significant diagnostics from this hospitalization (including imaging, microbiology, ancillary and laboratory) are listed below for reference.      Microbiology: Recent Results (from the past 240 hour(s))  Culture, blood (routine x 2)     Status: None (Preliminary result)   Collection Time: 11/30/16 12:57 AM  Result Value Ref Range Status   Specimen Description BLOOD RIGHT ANTECUBITAL  Final   Special Requests IN PEDIATRIC BOTTLE Blood Culture adequate volume  Final   Culture   Final    NO GROWTH 4 DAYS Performed at [facility], [place] [address], [place], [place] [address]    Report Status PENDING  Incomplete  Culture, blood (routine x 2)     Status: None (Preliminary result)   Collection Time: 11/30/16  1:00 AM  Result Value Ref Range Status   Specimen Description BLOOD BLOOD LEFT HAND  Final   Special Requests IN PEDIATRIC BOTTLE Blood Culture adequate volume  Final   Culture   Final    NO GROWTH 4 DAYS Performed at [facility], [place] [address] [place], [place] [address]    Report Status PENDING  Incomplete     Labs: BNP (last 3 results)  Recent Labs  09/21/16 0138  BNP 4,270.6*   Basic Metabolic Panel:  Recent Labs Lab 11/29/16 1818 11/30/16 0119 12/01/16 0544  NA 139 139 136  K 3.5 3.3* 3.1*  CL 104 106 103  CO2 27 24 25   GLUCOSE 111* 113* 88  BUN 20 19 16   CREATININE 1.06* 0.97 0.81  CALCIUM 8.7* 8.3* 8.0*   Liver Function Tests:  Recent Labs Lab 11/29/16 1818  AST 27  ALT 13*  ALKPHOS 104  BILITOT 1.0  PROT 6.9  ALBUMIN 3.1*   No results for input(s): LIPASE, AMYLASE in the last 168 hours. No results for input(s): AMMONIA in the last 168 hours. CBC:  Recent Labs Lab 11/29/16 1818 11/30/16 0119  WBC 9.4 8.2  NEUTROABS 6.6 5.3  HGB 11.5* 11.2*  HCT 34.2* 32.9*  MCV 89.8 91.4  PLT 174 158   Cardiac Enzymes: No results for input(s): CKTOTAL, CKMB, CKMBINDEX, TROPONINI in the last 168 hours. BNP: Invalid input(s): POCBNP CBG: No results for input(s): GLUCAP in the last 168 hours. D-Dimer No results for input(s): DDIMER in the last 72 hours. Hgb A1c No results  for input(s): HGBA1C in the last 72 hours. Lipid Profile No results for input(s): CHOL, HDL, LDLCALC, TRIG, CHOLHDL, LDLDIRECT in the last 72 hours. Thyroid function studies No results for input(s): TSH, T4TOTAL, T3FREE, THYROIDAB in the last 72 hours.  Invalid input(s): FREET3 Anemia work up No results for input(s): VITAMINB12, FOLATE, FERRITIN, TIBC, IRON, RETICCTPCT in the last 72 hours. Urinalysis  Component Value Date/Time   COLORURINE YELLOW 09/21/2016 0638   APPEARANCEUR CLEAR 09/21/2016 0638   LABSPEC 1.016 09/21/2016 0638   PHURINE 5.0 09/21/2016 0638   GLUCOSEU NEGATIVE 09/21/2016 0638   HGBUR MODERATE (A) 09/21/2016 0638   BILIRUBINUR NEGATIVE 09/21/2016 0638   KETONESUR NEGATIVE 09/21/2016 0638   PROTEINUR 100 (A) 09/21/2016 0638   UROBILINOGEN 4.0 (H) 08/04/2014 1608   NITRITE NEGATIVE 09/21/2016 0638   LEUKOCYTESUR NEGATIVE 09/21/2016 0638   Sepsis Labs Invalid input(s): PROCALCITONIN,  WBC,  LACTICIDVEN Microbiology Recent Results (from the past 240 hour(s))  Culture, blood (routine x 2)     Status: None (Preliminary result)   Collection Time: 11/30/16 12:57 AM  Result Value Ref Range Status   Specimen Description BLOOD RIGHT ANTECUBITAL  Final   Special Requests IN PEDIATRIC BOTTLE Blood Culture adequate volume  Final   Culture   Final    NO GROWTH 4 DAYS Performed at [facility], [place] [address], [place], [place] [address]    Report Status PENDING  Incomplete  Culture, blood (routine x 2)     Status: None (Preliminary result)   Collection Time: 11/30/16  1:00 AM  Result Value Ref Range Status   Specimen Description BLOOD BLOOD LEFT HAND  Final   Special Requests IN PEDIATRIC BOTTLE Blood Culture adequate volume  Final   Culture   Final    NO GROWTH 4 DAYS Performed at [facility], [place] [address] [place], [place] [address]    Report Status PENDING  Incomplete     Time coordinating discharge: 45 minutes  SIGNED:  Marzetta Board,  MD  [facility] 12/05/2016, 9:58 AM Pager [phone]  If 7PM-7AM, please contact night-coverage [email or web address] Password [email or web address]

## 2016-12-05 NOTE — Progress Notes (Addendum)
LCSW following for disposition:    [place] has been obtained:   [id number]   Authorized for 3 days, Next review date 12/07/16 . [place]:  [facility] Patient has been approved for 532 therapy minutes a week Clinicals can be faxed to [facility]:  [phone]  Patient going to room:  [facility] Report:  [phone] Patient to transport after 1pm. Call placed to facility and message left for the above information.  Will complete discharge when called back.  Patient has been accepted to [facility] for SNF Spoke to facility and they are aware and agreeable to accept patient for admission today. All clinicals sent to facility via [facility] requesting EMS for transport.  [name] has changed her mind and will take patient to the facility and no use EMS.  Call placed to [name], but she is at a doctor's appointment, will continue to reach or reach other sister.    DC to SNF today.  Lane Hacker, MSW Clinical Social Work: [employer] Coverage for :  [phone]

## 2016-12-05 NOTE — Progress Notes (Signed)
Plan for d/c to SNF, discharge planning per CSW. [phone] 

## 2017-02-14 ENCOUNTER — Inpatient Hospital Stay (HOSPITAL_COMMUNITY)
Admission: EM | Admit: 2017-02-14 | Discharge: 2017-02-22 | DRG: 872 | Disposition: A | Discharge status: Hospice | Payer: Medicare Other | Attending: Internal Medicine | Admitting: Internal Medicine

## 2017-02-14 ENCOUNTER — Encounter (HOSPITAL_COMMUNITY): Payer: Self-pay

## 2017-02-14 DIAGNOSIS — M6250 Muscle wasting and atrophy, not elsewhere classified, unspecified site: Secondary | ICD-10-CM | POA: Diagnosis present

## 2017-02-14 DIAGNOSIS — H409 Unspecified glaucoma: Secondary | ICD-10-CM | POA: Diagnosis present

## 2017-02-14 DIAGNOSIS — Z515 Encounter for palliative care: Secondary | ICD-10-CM | POA: Diagnosis present

## 2017-02-14 DIAGNOSIS — N179 Acute kidney failure, unspecified: Secondary | ICD-10-CM | POA: Diagnosis present

## 2017-02-14 DIAGNOSIS — I5032 Chronic diastolic (congestive) heart failure: Secondary | ICD-10-CM | POA: Diagnosis present

## 2017-02-14 DIAGNOSIS — Z8673 Personal history of transient ischemic attack (TIA), and cerebral infarction without residual deficits: Secondary | ICD-10-CM

## 2017-02-14 DIAGNOSIS — M79604 Pain in right leg: Secondary | ICD-10-CM | POA: Diagnosis not present

## 2017-02-14 DIAGNOSIS — Z6821 Body mass index (BMI) 21.0-21.9, adult: Secondary | ICD-10-CM | POA: Diagnosis not present

## 2017-02-14 DIAGNOSIS — L03116 Cellulitis of left lower limb: Secondary | ICD-10-CM | POA: Diagnosis present

## 2017-02-14 DIAGNOSIS — E876 Hypokalemia: Secondary | ICD-10-CM | POA: Diagnosis present

## 2017-02-14 DIAGNOSIS — R5381 Other malaise: Secondary | ICD-10-CM | POA: Diagnosis present

## 2017-02-14 DIAGNOSIS — M79605 Pain in left leg: Secondary | ICD-10-CM | POA: Diagnosis not present

## 2017-02-14 DIAGNOSIS — Z8249 Family history of ischemic heart disease and other diseases of the circulatory system: Secondary | ICD-10-CM

## 2017-02-14 DIAGNOSIS — R197 Diarrhea, unspecified: Secondary | ICD-10-CM | POA: Diagnosis present

## 2017-02-14 DIAGNOSIS — Z66 Do not resuscitate: Secondary | ICD-10-CM | POA: Diagnosis present

## 2017-02-14 DIAGNOSIS — E039 Hypothyroidism, unspecified: Secondary | ICD-10-CM | POA: Diagnosis present

## 2017-02-14 DIAGNOSIS — Z79899 Other long term (current) drug therapy: Secondary | ICD-10-CM

## 2017-02-14 DIAGNOSIS — Z952 Presence of prosthetic heart valve: Secondary | ICD-10-CM

## 2017-02-14 DIAGNOSIS — Z888 Allergy status to other drugs, medicaments and biological substances status: Secondary | ICD-10-CM

## 2017-02-14 DIAGNOSIS — L0390 Cellulitis, unspecified: Secondary | ICD-10-CM | POA: Diagnosis present

## 2017-02-14 DIAGNOSIS — I130 Hypertensive heart and chronic kidney disease with heart failure and stage 1 through stage 4 chronic kidney disease, or unspecified chronic kidney disease: Secondary | ICD-10-CM | POA: Diagnosis present

## 2017-02-14 DIAGNOSIS — L120 Bullous pemphigoid: Secondary | ICD-10-CM | POA: Diagnosis present

## 2017-02-14 DIAGNOSIS — D696 Thrombocytopenia, unspecified: Secondary | ICD-10-CM | POA: Diagnosis present

## 2017-02-14 DIAGNOSIS — Z91048 Other nonmedicinal substance allergy status: Secondary | ICD-10-CM

## 2017-02-14 DIAGNOSIS — Z96642 Presence of left artificial hip joint: Secondary | ICD-10-CM | POA: Diagnosis present

## 2017-02-14 DIAGNOSIS — Z823 Family history of stroke: Secondary | ICD-10-CM

## 2017-02-14 DIAGNOSIS — N183 Chronic kidney disease, stage 3 (moderate): Secondary | ICD-10-CM | POA: Diagnosis present

## 2017-02-14 DIAGNOSIS — Z7189 Other specified counseling: Secondary | ICD-10-CM | POA: Diagnosis not present

## 2017-02-14 DIAGNOSIS — Z885 Allergy status to narcotic agent status: Secondary | ICD-10-CM

## 2017-02-14 DIAGNOSIS — R509 Fever, unspecified: Secondary | ICD-10-CM | POA: Diagnosis present

## 2017-02-14 DIAGNOSIS — I2510 Atherosclerotic heart disease of native coronary artery without angina pectoris: Secondary | ICD-10-CM | POA: Diagnosis present

## 2017-02-14 DIAGNOSIS — I482 Chronic atrial fibrillation: Secondary | ICD-10-CM | POA: Diagnosis present

## 2017-02-14 DIAGNOSIS — L02419 Cutaneous abscess of limb, unspecified: Secondary | ICD-10-CM | POA: Diagnosis not present

## 2017-02-14 DIAGNOSIS — Z9049 Acquired absence of other specified parts of digestive tract: Secondary | ICD-10-CM

## 2017-02-14 DIAGNOSIS — A419 Sepsis, unspecified organism: Secondary | ICD-10-CM | POA: Diagnosis present

## 2017-02-14 DIAGNOSIS — Z79890 Hormone replacement therapy (postmenopausal): Secondary | ICD-10-CM

## 2017-02-14 DIAGNOSIS — L03115 Cellulitis of right lower limb: Secondary | ICD-10-CM

## 2017-02-14 DIAGNOSIS — L89150 Pressure ulcer of sacral region, unstageable: Secondary | ICD-10-CM | POA: Diagnosis present

## 2017-02-14 DIAGNOSIS — Z905 Acquired absence of kidney: Secondary | ICD-10-CM

## 2017-02-14 DIAGNOSIS — Z7901 Long term (current) use of anticoagulants: Secondary | ICD-10-CM

## 2017-02-14 DIAGNOSIS — Z85828 Personal history of other malignant neoplasm of skin: Secondary | ICD-10-CM

## 2017-02-14 DIAGNOSIS — I4891 Unspecified atrial fibrillation: Secondary | ICD-10-CM | POA: Diagnosis not present

## 2017-02-14 DIAGNOSIS — L129 Pemphigoid, unspecified: Secondary | ICD-10-CM | POA: Diagnosis not present

## 2017-02-14 DIAGNOSIS — Z951 Presence of aortocoronary bypass graft: Secondary | ICD-10-CM

## 2017-02-14 DIAGNOSIS — R441 Visual hallucinations: Secondary | ICD-10-CM | POA: Diagnosis not present

## 2017-02-14 DIAGNOSIS — L03119 Cellulitis of unspecified part of limb: Secondary | ICD-10-CM | POA: Diagnosis not present

## 2017-02-14 LAB — PROTIME-INR
INR: 2.66
PROTHROMBIN TIME: 28.1 s — AB (ref 11.4–15.2)

## 2017-02-14 LAB — CBC WITH DIFFERENTIAL/PLATELET
BASOS PCT: 0 %
Basophils Absolute: 0 10*3/uL (ref 0.0–0.1)
EOS ABS: 0 10*3/uL (ref 0.0–0.7)
EOS PCT: 0 %
HEMATOCRIT: 39.5 % (ref 36.0–46.0)
HEMOGLOBIN: 13.5 g/dL (ref 12.0–15.0)
LYMPHS PCT: 4 %
Lymphs Abs: 0.7 10*3/uL (ref 0.7–4.0)
MCH: 31.9 pg (ref 26.0–34.0)
MCHC: 34.2 g/dL (ref 30.0–36.0)
MCV: 93.4 fL (ref 78.0–100.0)
Monocytes Absolute: 0.9 10*3/uL (ref 0.1–1.0)
Monocytes Relative: 5 %
NEUTROS ABS: 15.5 10*3/uL — AB (ref 1.7–7.7)
Neutrophils Relative %: 91 %
Platelets: 118 10*3/uL — ABNORMAL LOW (ref 150–400)
RBC: 4.23 MIL/uL (ref 3.87–5.11)
RDW: 15.7 % — ABNORMAL HIGH (ref 11.5–15.5)
WBC: 17.1 10*3/uL — ABNORMAL HIGH (ref 4.0–10.5)

## 2017-02-14 LAB — BASIC METABOLIC PANEL
Anion gap: 14 (ref 5–15)
BUN: 34 mg/dL — AB (ref 6–20)
CHLORIDE: 101 mmol/L (ref 101–111)
CO2: 23 mmol/L (ref 22–32)
CREATININE: 1.48 mg/dL — AB (ref 0.44–1.00)
Calcium: 8.7 mg/dL — ABNORMAL LOW (ref 8.9–10.3)
GFR calc Af Amer: 34 mL/min — ABNORMAL LOW (ref >60)
GFR calc non Af Amer: 30 mL/min — ABNORMAL LOW (ref >60)
GLUCOSE: 100 mg/dL — AB (ref 65–99)
Potassium: 3.4 mmol/L — ABNORMAL LOW (ref 3.5–5.1)
Sodium: 138 mmol/L (ref 135–145)

## 2017-02-14 LAB — APTT: aPTT: 36 seconds (ref 24–36)

## 2017-02-14 LAB — LACTIC ACID, PLASMA: Lactic Acid, Venous: 3.8 mmol/L (ref 0.5–1.9)

## 2017-02-14 LAB — PROCALCITONIN: PROCALCITONIN: 75.92 ng/mL

## 2017-02-14 MED ORDER — VANCOMYCIN HCL IN DEXTROSE 1-5 GM/200ML-% IV SOLN: 1000.0000 mg | INTRAVENOUS | Status: DC

## 2017-02-14 MED ORDER — CALCIUM CARBONATE-VITAMIN D 500-200 MG-UNIT PO TABS
1.0000 | ORAL_TABLET | Freq: Every evening | ORAL | Status: DC
  Administered 2017-02-15 – 2017-02-21 (×7): 1 via ORAL
  Filled 2017-02-14 (×7): qty 1

## 2017-02-14 MED ORDER — BISACODYL 5 MG PO TBEC: 5.0000 mg | DELAYED_RELEASE_TABLET | Freq: Every day | ORAL | Status: DC | PRN

## 2017-02-14 MED ORDER — CLOBETASOL PROPIONATE 0.05 % EX OINT
1.0000 "application " | TOPICAL_OINTMENT | Freq: Two times a day (BID) | CUTANEOUS | Status: DC | PRN
  Filled 2017-02-14: qty 15

## 2017-02-14 MED ORDER — PIPERACILLIN-TAZOBACTAM 3.375 G IVPB 30 MIN
3.3750 g | Freq: Once | INTRAVENOUS | Status: AC
  Administered 2017-02-14: 3.375 g via INTRAVENOUS
  Filled 2017-02-14: qty 50

## 2017-02-14 MED ORDER — LEVOTHYROXINE SODIUM 50 MCG PO TABS
50.0000 ug | ORAL_TABLET | Freq: Every day | ORAL | Status: DC
  Administered 2017-02-15 – 2017-02-22 (×7): 50 ug via ORAL
  Filled 2017-02-14 (×8): qty 1

## 2017-02-14 MED ORDER — WARFARIN - PHARMACIST DOSING INPATIENT: Freq: Every day | Status: DC

## 2017-02-14 MED ORDER — ENSURE ENLIVE PO LIQD
237.0000 mL | Freq: Two times a day (BID) | ORAL | Status: DC
  Administered 2017-02-15 – 2017-02-22 (×6): 237 mL via ORAL

## 2017-02-14 MED ORDER — TIMOLOL MALEATE 0.5 % OP SOLN
1.0000 [drp] | Freq: Every day | OPHTHALMIC | Status: DC
  Administered 2017-02-15 – 2017-02-22 (×9): 1 [drp] via OPHTHALMIC
  Filled 2017-02-14: qty 5

## 2017-02-14 MED ORDER — ACETAMINOPHEN 325 MG PO TABS
650.0000 mg | ORAL_TABLET | Freq: Four times a day (QID) | ORAL | Status: DC | PRN
  Administered 2017-02-15 – 2017-02-16 (×3): 650 mg via ORAL
  Filled 2017-02-14 (×5): qty 2

## 2017-02-14 MED ORDER — ADULT MULTIVITAMIN W/MINERALS CH
1.0000 | ORAL_TABLET | Freq: Every evening | ORAL | Status: DC
  Administered 2017-02-15 – 2017-02-21 (×7): 1 via ORAL
  Filled 2017-02-14 (×7): qty 1

## 2017-02-14 MED ORDER — METOPROLOL TARTRATE 5 MG/5ML IV SOLN
5.0000 mg | Freq: Four times a day (QID) | INTRAVENOUS | Status: DC | PRN
  Administered 2017-02-14 – 2017-02-15 (×2): 5 mg via INTRAVENOUS
  Filled 2017-02-14 (×3): qty 5

## 2017-02-14 MED ORDER — NIACIN 500 MG PO TABS
500.0000 mg | ORAL_TABLET | Freq: Two times a day (BID) | ORAL | Status: DC
  Administered 2017-02-15 – 2017-02-22 (×12): 500 mg via ORAL
  Filled 2017-02-14 (×16): qty 1

## 2017-02-14 MED ORDER — SODIUM CHLORIDE 0.9 % IV SOLN
INTRAVENOUS | Status: DC
  Administered 2017-02-14: 22:00:00 via INTRAVENOUS

## 2017-02-14 MED ORDER — SODIUM CHLORIDE 0.9 % IV BOLUS (SEPSIS)
1000.0000 mL | Freq: Once | INTRAVENOUS | Status: AC
Start: 2017-02-14 — End: 2017-02-14
  Administered 2017-02-14: 1000 mL via INTRAVENOUS

## 2017-02-14 MED ORDER — PIPERACILLIN-TAZOBACTAM 3.375 G IVPB 30 MIN: 3.3750 g | Freq: Once | INTRAVENOUS | Status: DC

## 2017-02-14 MED ORDER — PIPERACILLIN-TAZOBACTAM IN DEX 2-0.25 GM/50ML IV SOLN
2.2500 g | Freq: Four times a day (QID) | INTRAVENOUS | Status: DC
  Administered 2017-02-15: 2.25 g via INTRAVENOUS
  Filled 2017-02-14 (×3): qty 50

## 2017-02-14 MED ORDER — ONDANSETRON HCL 4 MG/2ML IJ SOLN
4.0000 mg | Freq: Four times a day (QID) | INTRAMUSCULAR | Status: DC | PRN
Start: 2017-02-14 — End: 2017-02-22

## 2017-02-14 MED ORDER — ACETAMINOPHEN 325 MG PO TABS: 650.0000 mg | ORAL_TABLET | Freq: Once | ORAL | Status: DC

## 2017-02-14 MED ORDER — VANCOMYCIN HCL IN DEXTROSE 1-5 GM/200ML-% IV SOLN: 1000.0000 mg | Freq: Once | INTRAVENOUS | Status: DC

## 2017-02-14 MED ORDER — VANCOMYCIN HCL IN DEXTROSE 1-5 GM/200ML-% IV SOLN
1000.0000 mg | Freq: Once | INTRAVENOUS | Status: AC
  Administered 2017-02-14: 1000 mg via INTRAVENOUS
  Filled 2017-02-14: qty 200

## 2017-02-14 MED ORDER — ONDANSETRON HCL 4 MG PO TABS: 4.0000 mg | ORAL_TABLET | Freq: Four times a day (QID) | ORAL | Status: DC | PRN

## 2017-02-14 MED ORDER — WARFARIN SODIUM 1 MG PO TABS
1.5000 mg | ORAL_TABLET | Freq: Once | ORAL | Status: AC
  Administered 2017-02-15: 01:00:00 1.5 mg via ORAL
  Filled 2017-02-14: qty 1

## 2017-02-14 MED ORDER — POTASSIUM CHLORIDE CRYS ER 20 MEQ PO TBCR
20.0000 meq | EXTENDED_RELEASE_TABLET | Freq: Every day | ORAL | Status: DC
  Administered 2017-02-15 – 2017-02-22 (×8): 20 meq via ORAL
  Filled 2017-02-14 (×8): qty 1

## 2017-02-14 MED ORDER — VITAMIN D 1000 UNITS PO TABS
2000.0000 [IU] | ORAL_TABLET | Freq: Every evening | ORAL | Status: DC
  Administered 2017-02-15 – 2017-02-21 (×7): 2000 [IU] via ORAL
  Filled 2017-02-14 (×7): qty 2

## 2017-02-14 MED ORDER — LATANOPROST 0.005 % OP SOLN
1.0000 [drp] | Freq: Every day | OPHTHALMIC | Status: DC
  Administered 2017-02-15 – 2017-02-21 (×8): 1 [drp] via OPHTHALMIC
  Filled 2017-02-14: qty 2.5

## 2017-02-14 NOTE — ED Provider Notes (Signed)
[facility] DEPT Provider Note   CSN: [id number] Arrival date & time: 02/14/17  1457     History   Chief Complaint Chief Complaint  Patient presents with  . Fever  . Leg Swelling    HPI Amanda Richardson is a 83 y.o. female.  Level 5 caveat for mild dementia.  Patient presents with fever and redness in both her lower legs.  Temperature was reported to be 101.5 and patient received 1000 mg of Tylenol at 1300.  Patient has multiple health problems well documented in the past medical history.      Past Medical History:  Diagnosis Date  . Allergic rhinitis   . Anemia    not applicable since OAC-16S when pt had hysterectomy   . Basal cell carcinoma   . Bilateral lower extremity edema    history of venous insufficiency  . Bullous pemphigoid   . Chronic anticoagulation   . Chronic atrial fibrillation ([place])   . Chronic diastolic CHF (congestive heart failure) ([place])   . Coronary artery disease    a. CABG 2001 at time of MVR.  Marland Kitchen DVT (deep venous thrombosis) ([place])    possible in 1990s and does not remember being on blood thinner  . Glaucoma   . H/O mitral valve replacement 2001   mechanical heart valve; family states indication may have been MR from MVP or  rheumatic heart disease  . Hypertension   . Hypothyroidism   . Ischemic bowel disease ([place])    missing 2/3 of bowel  . S/p nephrectomy    left  . Stroke [facility])    a. CT head 2018 showed old left anterior watershed infarct, old right caudate lacunar infarct.  . Thrombocytopenia ([place])   . Urinary incontinence     Patient Active Problem List   Diagnosis Date Noted  . Pressure injury of skin 09/22/2016  . Acute metabolic encephalopathy 10/22/1599  . Sepsis ([place]) 09/21/2016  . Cellulitis 09/21/2016  . Permanent atrial fibrillation ([place])   . Fall   . Chronic diastolic CHF (congestive heart failure) ([place]) 07/16/2016  . Anemia 07/16/2016  . Thrombocytopenia ([place]) 07/16/2016  . Chronic  venous insufficiency 07/16/2016  . Hematoma of left hip 07/13/2016  . H/O mechanical mitral valve replacement 2001   . Hypertension   . S/p nephrectomy   . H/O coronary artery bypass surgery   . Chronic atrial fibrillation ([place])   . Hypothyroidism   . Bullous pemphigoid   . Chronic anticoagulation     Past Surgical History:  Procedure Laterality Date  . ABDOMINAL HYSTERECTOMY  1960s  . APPENDECTOMY  1960s  . CORONARY ARTERY BYPASS GRAFT  01/2000  . HERNIA REPAIR    . HIP ARTHROPLASTY Left 12/18/2015   Procedure: HEMIARTHROPLASTY  HIP;  Surgeon: Tania Ade, MD;  Location: [facility];  Service: Orthopedics;  Laterality: Left;  . hx broken collarbone    . LAPAROSCOPIC SMALL BOWEL RESECTION  2004 or 5   adhesions and ischemic bowel  . MITRAL VALVE REPLACEMENT  01/2000   mechanical heart valve  . TONSILLECTOMY  1933  . TRANSTHORACIC ECHOCARDIOGRAM  02/2012   EF 60-65%, mild LVH; mildly thickened AV leaflets with sclerosis and moderate regurg; mechanical MV prosthesis; LA dilated; RVSP increased; RA severely dilated; increased LA pressure; moderate TR    OB History    No data available       Home Medications    Prior to Admission medications   Medication Sig Start Date End  Date Taking? Authorizing Provider  acetaminophen (TYLENOL) 325 MG tablet Take 2 tablets (650 mg total) by mouth every 6 (six) hours as needed for mild pain (or Fever >/= 101). 07/25/16   Theodis Blaze, MD  amoxicillin (AMOXIL) 500 MG capsule Take 1 capsule (500 mg total) by mouth daily. Start after finishing Augmenting 12/05/16   Caren Griffins, MD  bisacodyl (DULCOLAX) 5 MG EC tablet Take 1 tablet (5 mg total) by mouth daily as needed for moderate constipation. 12/05/16   Caren Griffins, MD  calcium-vitamin D (OSCAL WITH D) 500-200 MG-UNIT per tablet Take 1 tablet by mouth every evening.     [provider]  carvedilol (COREG) 6.25 MG tablet Take 1 tablet (6.25 mg total) by mouth 2 (two) times  daily. 09/22/16 09/22/17  Florencia Reasons, MD  Cholecalciferol 2000 units CAPS Take 1 capsule by mouth every evening.    [provider]  clobetasol ointment (TEMOVATE) 7.00 % Apply 1 application topically 2 (two) times daily as needed (blisters for bulous pemphigoid).     [provider]  furosemide (LASIX) 40 MG tablet Take 1 tablet (40 mg total) by mouth every Monday, Wednesday, and Friday. May change to daily if bilateral lower legs start to have fluids retention. 09/22/16   Florencia Reasons, MD  HYDROcodone-acetaminophen (NORCO/VICODIN) 5-325 MG tablet Take 1-2 tablets by mouth every 4 (four) hours as needed for moderate pain. 12/05/16   Gherghe, Vella Redhead, MD  KLOR-CON M20 20 MEQ tablet Take 1 tablet by mouth daily.  08/22/14   [provider]  latanoprost (XALATAN) 0.005 % ophthalmic solution Place 1 drop into both eyes at bedtime.     [provider]  levothyroxine (SYNTHROID, LEVOTHROID) 50 MCG tablet Take 50 mcg by mouth daily. 05/05/14   [provider]  lisinopril (ZESTRIL) 2.5 MG tablet Take 1 tablet (2.5 mg total) by mouth daily. 09/22/16 09/22/17  Florencia Reasons, MD  Multiple Vitamin (MULTIVITAMIN WITH MINERALS) TABS Take 1 tablet by mouth every evening.     [provider]  niacinamide 500 MG tablet Take 500 mg by mouth 2 (two) times daily with a meal.    [provider]  Probiotic Product (PROBIOTIC PO) Take 1 capsule by mouth every other day.    [provider]  timolol (BETIMOL) 0.5 % ophthalmic solution Place 1 drop into both eyes daily.    [provider]  warfarin (COUMADIN) 3 MG tablet Take 3 mg by mouth daily.    [provider]    Family History Family History  Problem Relation Age of Onset  . Irregular heart beat Mother   . Other Mother        pellegra  . Heart failure Mother        had very swollen legs??  . Stroke Mother   . Prostate cancer Father        also skin cancer  . Heart attack Daughter   . Heart  disease Brother   . Heart attack Brother 14       still alive    Social History Social History  Substance Use Topics  . Smoking status: Never Smoker  . Smokeless tobacco: Never Used  . Alcohol use No     Allergies   Heparin and Codeine   Review of Systems Review of Systems  Unable to perform ROS: Dementia     Physical Exam Updated Vital Signs BP (!) 102/49   Pulse (!) 104   Temp  99.8 F (37.7 C) (Oral)   Resp (!) 26   SpO2 95%   Physical Exam  Constitutional: She is oriented to person, place, and time.  nad  HENT:  Head: Normocephalic and atraumatic.  Eyes: Conjunctivae are normal.  Neck: Neck supple.  Cardiovascular: Regular rhythm.   tachy  Pulmonary/Chest: Effort normal and breath sounds normal.  Abdominal: Soft. Bowel sounds are normal.  Musculoskeletal: Normal range of motion.  Neurological: She is alert and oriented to person, place, and time.  Skin:  Bilateral lower extremities: Significant edema and erythema from knee distally, right greater than left  Psychiatric: She has a normal mood and affect. Her behavior is normal.  Nursing note and vitals reviewed.    ED Treatments / Results  Labs (all labs ordered are listed, but only abnormal results are displayed) Labs Reviewed  CBC WITH DIFFERENTIAL/PLATELET - Abnormal; Notable for the following:       Result Value   WBC 17.1 (*)    RDW 15.7 (*)    All other components within normal limits  BASIC METABOLIC PANEL - Abnormal; Notable for the following:    Potassium 3.4 (*)    Glucose, Bld 100 (*)    BUN 34 (*)    Creatinine, Ser 1.48 (*)    Calcium 8.7 (*)    GFR calc non Af Amer 30 (*)    GFR calc Af Amer 34 (*)    All other components within normal limits    EKG  EKG Interpretation None       Radiology No results found.  Procedures Procedures (including critical care time)  Medications Ordered in ED Medications  acetaminophen (TYLENOL) tablet 650 mg (not administered)    vancomycin (VANCOCIN) IVPB 1000 mg/200 mL premix (1,000 mg Intravenous New Bag/Given 02/14/17 1613)  sodium chloride 0.9 % bolus 1,000 mL (1,000 mLs Intravenous New Bag/Given 02/14/17 1607)     Initial Impression / Assessment and Plan / ED Course  I have reviewed the triage vital signs and the nursing notes.  Pertinent labs & imaging results that were available during my care of the patient were reviewed by me and considered in my medical decision making (see chart for details).     Patient presents with erythema of her lower legs c fever.  She is slightly tachycardic, but otherwise stable.  Will Rx IV fluids and vancomycin.  Admit to general medicine.  Final Clinical Impressions(s) / ED Diagnoses   Final diagnoses:  Cellulitis of left lower leg  Cellulitis of right lower leg    New Prescriptions New Prescriptions   No medications on file     Nat Christen, MD 02/14/17 [phone]

## 2017-02-14 NOTE — Progress Notes (Signed)
ANTICOAGULATION CONSULT NOTE   Pharmacy Consult for Warfarin Indication: atrial fibrillation and DVT and Mitral Valve replacement  Allergies  Allergen Reactions  . Heparin Anaphylaxis  . Codeine Other (See Comments)    Unknown   . Tape     Plastic tape causes skin tears and blisters   Patient Measurements:   TBW: 56.7 kg  Vital Signs: Temp: 99.8 F (37.7 C) (10/24 1550) Temp Source: Oral (10/24 1550) BP: 113/69 (10/24 1830) Pulse Rate: 141 (10/24 1830)  Labs:  Recent Labs  02/14/17 1555  HGB 13.5  HCT 39.5  PLT 118*  CREATININE 1.48*   CrCl cannot be calculated (Unknown ideal weight.).  Medical History: Past Medical History:  Diagnosis Date  . Allergic rhinitis   . Anemia    not applicable since HCW-23J when pt had hysterectomy   . Basal cell carcinoma   . Bilateral lower extremity edema    history of venous insufficiency  . Bullous pemphigoid   . Chronic anticoagulation   . Chronic atrial fibrillation ([place])   . Chronic diastolic CHF (congestive heart failure) ([place])   . Coronary artery disease    a. CABG 2001 at time of MVR.  Marland Kitchen DVT (deep venous thrombosis) ([place])    possible in 1990s and does not remember being on blood thinner  . Glaucoma   . H/O mitral valve replacement 2001   mechanical heart valve; family states indication may have been MR from MVP or  rheumatic heart disease  . Hypertension   . Hypothyroidism   . Ischemic bowel disease ([place])    missing 2/3 of bowel  . S/p nephrectomy    left  . Stroke [facility])    a. CT head 2018 showed old left anterior watershed infarct, old right caudate lacunar infarct.  . Thrombocytopenia ([place])   . Urinary incontinence    Medications:  Scheduled:  . acetaminophen  650 mg Oral Once  . calcium-vitamin D  1 tablet Oral QPM  . cholecalciferol  2,000 Units Oral QPM  . [START ON 02/15/2017] feeding supplement (ENSURE ENLIVE)  237 mL Oral BID BM  . latanoprost  1 drop Both Eyes QHS  . [START ON 02/15/2017]  levothyroxine  50 mcg Oral QAC breakfast  . multivitamin with minerals  1 tablet Oral QPM  . [START ON 02/15/2017] niacin  500 mg Oral BID WC  . potassium chloride SA  20 mEq Oral Daily  . timolol  1 drop Both Eyes Daily   Assessment: 17 yoF, to ED 10/24 for LE cellulitis, Vancomycin & Zosyn ordered per Rx  Hx of Mitral Valve replacement, hx DVT, Afib - Warfarin 1.2mg  Tu,Th,Su; 3mg  other days - family member noted patient took 3mg  on 10/23 instead of 1.5mg   - Baseline INR 2.66 on admit, in therapeutic range,  Goal of Therapy:  INR goal 2.5-3.5 Monitor platelets by anticoagulation protocol: Yes   Plan:   Warfarin 1.5mg  tonight  Daily Protime/INR  Monitor CBC, s/s bleed  Nyoka Cowden, Gershon Shorten L 02/14/2017,6:55 PM

## 2017-02-14 NOTE — H&P (Addendum)
History and Physical    Amanda Richardson:[id number] DOB: 09-21-1923 DOA: 02/14/2017  Referring MD/NP/PA: Dr. Lacinda Axon  PCP: Merrilee Seashore, MD   Patient coming from: Home  Chief Complaint: Lower extremity swelling   HPI: Amanda Richardson is a 83 y.o. female with chronic diastolic CHF, chronic atrial fibrillation on Coumadin, bullous pemphigoid, presenting to emergency department with main concern of several days duration of progressively worsening lower extremity erythema and edema, tenderness to touch. Please note that patient provides very little history and daughter at bedside was able to assist with more details. Daughter explained that patient gets home health nurse that used to come twice a week and perform wound care on lower extremities and initially patient has been doing well. As patient's lower extremity wounds improved, nurse was coming to the house once a week and daughter reports noticing worsening erythema and swelling, some blisters got worse and ruptured. Patient has history of cellulitis in lower extremities and has been hospitalized for the problem before. Daughter reports subjective fevers and chills, occasional confusion and poor oral intake. Patient denies chest pain, no shortness of breath, no specific abdominal or urinary concerns.  ED Course: In emergency department, patient noted to be hemodynamically stable however, heart rate up to 140s, respiratory rate up to 26 breaths per minute. Blood pressure stable 135/61. Blood work notable for WBC 17 K, platelets 118, potassium 3.4, creatinine 1.48. Based on physical exam, emergency room physician place patient on vancomycin due to suspected bilateral lower extremity cellulitis. TRH asked to admit for further evaluation  Review of Systems:  Constitutional: Positive for fever, chills, appetite change and fatigue.  HENT: Negative for ear pain, nosebleeds, congestion, facial swelling, rhinorrhea, neck pain, neck stiffness  and ear discharge.   Eyes: Negative for pain, discharge, redness, itching and visual disturbance.  Respiratory: Negative for cough, choking, chest tightness, shortness of breath, wheezing and stridor.   Cardiovascular: Negative for chest pain, palpitations Gastrointestinal: Negative for abdominal distention.  Genitourinary: Negative for dysuria, urgency, frequency, hematuria, flank pain, decreased urine volume, difficulty urinating and dyspareunia.  Musculoskeletal: Negative for back pain Neurological: Negative for dizziness, tremors, seizures, syncope, facial asymmetry, speech difficulty, weakness Hematological: Negative for adenopathy. Positive for intermittent bruising Psychiatric/Behavioral: Negative for hallucinations, behavioral problems, confusion, dysphoric mood, decreased concentration and agitation.   Past Medical History:  Diagnosis Date  . Allergic rhinitis   . Anemia    not applicable since YJE-56D when pt had hysterectomy   . Basal cell carcinoma   . Bilateral lower extremity edema    history of venous insufficiency  . Bullous pemphigoid   . Chronic anticoagulation   . Chronic atrial fibrillation ([place])   . Chronic diastolic CHF (congestive heart failure) ([place])   . Coronary artery disease    a. CABG 2001 at time of MVR.  Marland Kitchen DVT (deep venous thrombosis) ([place])    possible in 1990s and does not remember being on blood thinner  . Glaucoma   . H/O mitral valve replacement 2001   mechanical heart valve; family states indication may have been MR from MVP or  rheumatic heart disease  . Hypertension   . Hypothyroidism   . Ischemic bowel disease ([place])    missing 2/3 of bowel  . S/p nephrectomy    left  . Stroke [facility])    a. CT head 2018 showed old left anterior watershed infarct, old right caudate lacunar infarct.  . Thrombocytopenia ([place])   . Urinary incontinence  Past Surgical History:  Procedure Laterality Date  . ABDOMINAL HYSTERECTOMY  1960s  . APPENDECTOMY   1960s  . CORONARY ARTERY BYPASS GRAFT  01/2000  . HERNIA REPAIR    . HIP ARTHROPLASTY Left 12/18/2015   Procedure: HEMIARTHROPLASTY  HIP;  Surgeon: Tania Ade, MD;  Location: [facility];  Service: Orthopedics;  Laterality: Left;  . hx broken collarbone    . LAPAROSCOPIC SMALL BOWEL RESECTION  2004 or 5   adhesions and ischemic bowel  . MITRAL VALVE REPLACEMENT  01/2000   mechanical heart valve  . TONSILLECTOMY  1933  . TRANSTHORACIC ECHOCARDIOGRAM  02/2012   EF 60-65%, mild LVH; mildly thickened AV leaflets with sclerosis and moderate regurg; mechanical MV prosthesis; LA dilated; RVSP increased; RA severely dilated; increased LA pressure; moderate TR   Social history  reports that she has never smoked. She has never used smokeless tobacco. She reports that she does not drink alcohol or use drugs.  Allergies  Allergen Reactions  . Heparin Anaphylaxis  . Codeine Other (See Comments)    Unknown   . Tape     Plastic tape causes skin tears and blisters    Family History  Problem Relation Age of Onset  . Irregular heart beat Mother   . Other Mother        pellegra  . Heart failure Mother        had very swollen legs??  . Stroke Mother   . Prostate cancer Father        also skin cancer  . Heart attack Daughter   . Heart disease Brother   . Heart attack Brother 53       still alive    Prior to Admission medications   Medication Sig Start Date End Date Taking? Authorizing Provider  acetaminophen (TYLENOL) 325 MG tablet Take 2 tablets (650 mg total) by mouth every 6 (six) hours as needed for mild pain (or Fever >/= 101). 07/25/16  Yes Theodis Blaze, MD  calcium-vitamin D (OSCAL WITH D) 500-200 MG-UNIT per tablet Take 1 tablet by mouth every evening.    Yes [provider]  carvedilol (COREG) 6.25 MG tablet Take 1 tablet (6.25 mg total) by mouth 2 (two) times daily. 09/22/16 09/22/17 Yes Florencia Reasons, MD  Cholecalciferol 2000 units CAPS Take 1 capsule by mouth every evening.   Yes  [provider]  doxycycline (VIBRAMYCIN) 100 MG capsule Take 100 mg by mouth daily.   Yes [provider]  furosemide (LASIX) 40 MG tablet Take 1 tablet (40 mg total) by mouth every Monday, Wednesday, and Friday. May change to daily if bilateral lower legs start to have fluids retention. 09/22/16  Yes Florencia Reasons, MD  KLOR-CON M20 20 MEQ tablet Take 1 tablet by mouth daily.  08/22/14  Yes [provider]  latanoprost (XALATAN) 0.005 % ophthalmic solution Place 1 drop into both eyes at bedtime.    Yes [provider]  levothyroxine (SYNTHROID, LEVOTHROID) 50 MCG tablet Take 50 mcg by mouth daily. 05/05/14  Yes [provider]  lisinopril (PRINIVIL,ZESTRIL) 10 MG tablet Take 10 mg by mouth daily.   Yes [provider]  Multiple Vitamin (MULTIVITAMIN WITH MINERALS) TABS Take 1 tablet by mouth every evening.    Yes [provider]  niacinamide 500 MG tablet Take 500 mg by mouth 2 (two) times daily with a meal.   Yes [provider]  Probiotic Product (PROBIOTIC PO) Take 1 capsule by mouth every other day.  Yes [provider]  timolol (BETIMOL) 0.5 % ophthalmic solution Place 1 drop into both eyes daily.   Yes [provider]  warfarin (COUMADIN) 3 MG tablet Take 1.5-3 mg by mouth as directed.    Yes [provider]  amoxicillin (AMOXIL) 500 MG capsule Take 1 capsule (500 mg total) by mouth daily. Start after finishing Augmenting Patient not taking: Reported on 02/14/2017 12/05/16   Caren Griffins, MD  bisacodyl (DULCOLAX) 5 MG EC tablet Take 1 tablet (5 mg total) by mouth daily as needed for moderate constipation. 12/05/16   Caren Griffins, MD  clobetasol ointment (TEMOVATE) 3.97 % Apply 1 application topically 2 (two) times daily as needed (blisters for bulous pemphigoid).     [provider]  HYDROcodone-acetaminophen (NORCO/VICODIN) 5-325 MG tablet Take 1-2 tablets by mouth every 4 (four) hours  as needed for moderate pain. Patient not taking: Reported on 02/14/2017 12/05/16   Caren Griffins, MD  lisinopril (ZESTRIL) 2.5 MG tablet Take 1 tablet (2.5 mg total) by mouth daily. Patient not taking: Reported on 02/14/2017 09/22/16 09/22/17  Florencia Reasons, MD    Physical Exam: Vitals:   02/14/17 1557 02/14/17 1600 02/14/17 1630 02/14/17 1700  BP:  (!) 102/49 (!) 106/52 113/70  Pulse: (!) 104  (!) 112 (!) 119  Resp: (!) 26 (!) 26 (!) 21 (!) 26  Temp:      TempSrc:      SpO2: 95%  99% 97%    Constitutional: NAD, calm, comfortable Vitals:   02/14/17 1557 02/14/17 1600 02/14/17 1630 02/14/17 1700  BP:  (!) 102/49 (!) 106/52 113/70  Pulse: (!) 104  (!) 112 (!) 119  Resp: (!) 26 (!) 26 (!) 21 (!) 26  Temp:      TempSrc:      SpO2: 95%  99% 97%   Eyes: PERRL, lids and conjunctivae normal ENMT: Mucous membranes are dry. Posterior pharynx clear of any exudate or lesions.Normal dentition.  Neck: normal, supple, no masses, no thyromegaly Respiratory: clear to auscultation bilaterally, Normal respiratory effort. No accessory muscle use.  Cardiovascular: Irregular rate and rhythm, no JVD noted Abdomen: no tenderness, no masses palpated. No hepatosplenomegaly. Bowel sounds positive.  Musculoskeletal: no clubbing / cyanosis. Bilateral lower extremity edema and erythema extending from feet up to knees, bullous lesions noted diffusely in lower and upper extremities Skin: Scattered ecchymosis noted on upper and lower extremities along with bullous lesions Neurologic: CN 2-12 grossly intact. Sensation intact, DTR normal.  Psychiatric: Normal judgment and insight  Labs on Admission: I have personally reviewed following labs and imaging studies  CBC:  Recent Labs Lab 02/14/17 1555  WBC 17.1*  NEUTROABS 15.5*  HGB 13.5  HCT 39.5  MCV 93.4  PLT 673*   Basic Metabolic Panel:  Recent Labs Lab 02/14/17 1555  NA 138  K 3.4*  CL 101  CO2 23  GLUCOSE 100*  BUN 34*  CREATININE 1.48*    CALCIUM 8.7*   Urine analysis:    Component Value Date/Time   COLORURINE YELLOW 09/21/2016 [place] 09/21/2016 0638   LABSPEC 1.016 09/21/2016 0638   PHURINE 5.0 09/21/2016 0638   GLUCOSEU NEGATIVE 09/21/2016 0638   HGBUR MODERATE (A) 09/21/2016 0638   BILIRUBINUR NEGATIVE 09/21/2016 0638   KETONESUR NEGATIVE 09/21/2016 0638   PROTEINUR 100 (A) 09/21/2016 0638   UROBILINOGEN 4.0 (H) 08/04/2014 1608   NITRITE NEGATIVE 09/21/2016 0638   LEUKOCYTESUR NEGATIVE 09/21/2016 4193   Radiological Exams on Admission:  No results found.  EKG: pending  Assessment/Plan Active Problems: Sepsis secondary to bilateral lower extremity cellulitis - Please note that patient meets criteria for sepsis - Sepsis order set in place - Patient started on vancomycin and I agree with regimen - will add zosyn per protocol and narrow down as clinically indicated  - Close monitoring of renal function while patient on vancomycin - Provide IV fluids - Follow-up on blood cultures - Check CBC in the morning  Atrial fibrillation with RVR - Patient on Coreg at home as well as Coumadin - Choric has been held due to low blood pressure, continue Coumadin - Placed on metoprolol as needed for now  Acute kidney injury imposed on CK D stage III - Baseline creatinine about 1.2 - Creatinine up on this admission likely prerenal in etiology - Hold Lasix and lisinopril for now - BMP in the morning  Hypokalemia - Supplement and repeat BMP in the morning  Thrombocytopenia - Possibly reactive, CBC in the morning   DVT prophylaxis: On Coumadin Code Status: Full code Family Communication: Pt and daughter updated at bedside Disposition Plan: Admit to telemetry unit Consults called: None Admission status: Inpatient  Faye Ramsay MD [facility] Pager [phone]  If 7PM-7AM, please contact night-coverage [email or web address] Password [facility]  02/14/2017, 6:23 PM

## 2017-02-14 NOTE — ED Triage Notes (Signed)
Per EMS- Patient had a fever today 101.5 and family gave Tylenol 1000 mg at 1300 today. EMS gave NS 500 ml prior to arrival tot the ED. Patient has a history of sepsis and boils on both legs.

## 2017-02-14 NOTE — Progress Notes (Signed)
A consult was received from an ED physician for vancomycin per pharmacy dosing.  The patient's profile has been reviewed for ht/wt/allergies/indication/available labs.   A one time order has been placed for vancomycin 1gm.  Further antibiotics/pharmacy consults should be ordered by admitting physician if indicated.                       Thank you, Minda Ditto 02/14/2017  4:01 PM

## 2017-02-14 NOTE — Progress Notes (Signed)
Pharmacy Antibiotic Note  Amanda Richardson is a 82 y.o. female admitted on 02/14/2017 with cellulitis.  Pharmacy has been consulted for Vancomycin & Zosyn dosing.  Plan: Vancomycin 1gm IV every 48 hours.  Goal trough 10-15 mcg/mL.  Zosyn 3.375gm x1, then 2.25gm q6hr    Temp (24hrs), Avg:99.8 F (37.7 C), Min:99.8 F (37.7 C), Max:99.8 F (37.7 C)   Recent Labs Lab 02/14/17 1555  WBC 17.1*  CREATININE 1.48*    CrCl cannot be calculated (Unknown ideal weight.).    Allergies  Allergen Reactions  . Heparin Anaphylaxis  . Codeine Other (See Comments)    Unknown   . Tape     Plastic tape causes skin tears and blisters   Antimicrobials this admission: 10/24 Vancomycin >>  10/24 Zosyn >>   Dose adjustments this admission:  Microbiology results: No Cx ordered  Thank you for allowing pharmacy to be a part of this patient's care.  Minda Ditto 02/14/2017 6:58 PM

## 2017-02-15 LAB — CBC
HEMATOCRIT: 34.2 % — AB (ref 36.0–46.0)
HEMOGLOBIN: 11.9 g/dL — AB (ref 12.0–15.0)
MCH: 32.2 pg (ref 26.0–34.0)
MCHC: 34.8 g/dL (ref 30.0–36.0)
MCV: 92.7 fL (ref 78.0–100.0)
Platelets: 97 10*3/uL — ABNORMAL LOW (ref 150–400)
RBC: 3.69 MIL/uL — ABNORMAL LOW (ref 3.87–5.11)
RDW: 15.7 % — ABNORMAL HIGH (ref 11.5–15.5)
WBC: 23.5 10*3/uL — AB (ref 4.0–10.5)

## 2017-02-15 LAB — BASIC METABOLIC PANEL
ANION GAP: 10 (ref 5–15)
BUN: 36 mg/dL — ABNORMAL HIGH (ref 6–20)
CO2: 21 mmol/L — ABNORMAL LOW (ref 22–32)
Calcium: 7.8 mg/dL — ABNORMAL LOW (ref 8.9–10.3)
Chloride: 108 mmol/L (ref 101–111)
Creatinine, Ser: 1.55 mg/dL — ABNORMAL HIGH (ref 0.44–1.00)
GFR, EST AFRICAN AMERICAN: 33 mL/min — AB (ref >60)
GFR, EST NON AFRICAN AMERICAN: 28 mL/min — AB (ref >60)
Glucose, Bld: 95 mg/dL (ref 65–99)
POTASSIUM: 3 mmol/L — AB (ref 3.5–5.1)
SODIUM: 139 mmol/L (ref 135–145)

## 2017-02-15 LAB — LACTIC ACID, PLASMA: LACTIC ACID, VENOUS: 3.2 mmol/L — AB (ref 0.5–1.9)

## 2017-02-15 LAB — PROTIME-INR
INR: 2.67
PROTHROMBIN TIME: 28.2 s — AB (ref 11.4–15.2)

## 2017-02-15 MED ORDER — SODIUM CHLORIDE 0.9 % IV BOLUS (SEPSIS)
500.0000 mL | Freq: Once | INTRAVENOUS | Status: AC
  Administered 2017-02-15: 500 mL via INTRAVENOUS

## 2017-02-15 MED ORDER — PIPERACILLIN-TAZOBACTAM IN DEX 2-0.25 GM/50ML IV SOLN
2.2500 g | Freq: Three times a day (TID) | INTRAVENOUS | Status: DC
  Administered 2017-02-15 – 2017-02-18 (×10): 2.25 g via INTRAVENOUS
  Filled 2017-02-15 (×10): qty 50

## 2017-02-15 MED ORDER — WARFARIN SODIUM 1 MG PO TABS
1.5000 mg | ORAL_TABLET | Freq: Once | ORAL | Status: AC
  Administered 2017-02-15: 18:00:00 1.5 mg via ORAL
  Filled 2017-02-15: qty 1

## 2017-02-15 MED ORDER — SODIUM CHLORIDE 0.9 % IV SOLN
INTRAVENOUS | Status: DC
  Administered 2017-02-15 – 2017-02-17 (×3): via INTRAVENOUS

## 2017-02-15 NOTE — Progress Notes (Signed)
[place] NOTE   Pharmacy Consult for Warfarin and vanc/zosyn for cellulitis Indication: atrial fibrillation and DVT and Mitral Valve replacement  Allergies  Allergen Reactions  . Heparin Anaphylaxis  . Codeine Other (See Comments)    Unknown   . Tape     Plastic tape causes skin tears and blisters   Patient Measurements: Height: 5\' 4"  (162.6 cm) Weight: 125 lb (56.7 kg) IBW/kg (Calculated) : 54.7 TBW: 56.7 kg  Vital Signs: Temp: 101.3 F (38.5 C) (10/25 0651) Temp Source: Rectal (10/25 0651) BP: 124/90 (10/25 0415) Pulse Rate: 146 (10/25 0415)  Labs:  Recent Labs  02/14/17 1555 02/14/17 2129 02/14/17 2256 02/15/17 0558  HGB 13.5  --   --  11.9*  HCT 39.5  --   --  34.2*  PLT 118*  --   --  97*  APTT  --   --  36  --   LABPROT  --  28.1*  --  28.2*  INR  --  2.66  --  2.67  CREATININE 1.48*  --   --  1.55*   Estimated Creatinine Clearance: 20.4 mL/min (A) (by C-G formula based on SCr of 1.55 mg/dL (H)).  Assessment: 21 yoF, to ED 10/24 for LE cellulitis, Vancomycin & Zosyn ordered per Rx  Hx of Mitral Valve replacement, hx DVT, Afib - Warfarin 1.5 mg Tu,Th,Su; 3mg  other days - family member noted patient took 3mg  on 10/23 instead of 1.5mg   02/15/2017 INR therapeutic at 2.676, Hg 13.5>>11.9, pltc Low at 97, 118 on admit, PLTC 158 in August but 117 in May &  June 2018.  No bleeding reported.  ID: WBC 17.1>>23.5, lactate 3.2, PCT 75. T max 101.3., Cr up to 1.55  Goal of Therapy:  INR goal 2.5-3.5 Monitor platelets by anticoagulation protocol: Yes   Plan:   Warfarin 1.5 mg tonight  Daily Protime/INR  Monitor CBC, s/s bleed  Continue vancomycin 1 gm IV q48  Zosyn 2.25 gm IV q8h  Eudelia Bunch, Pharm.D. [phone] 02/15/2017 8:01 AM

## 2017-02-15 NOTE — Progress Notes (Signed)
CRITICAL VALUE ALERT  Critical Value:  LA 3.8  Date & Time Notied:  02/14/17 2334  Provider Notified: Kennon Holter  Orders Received/Actions taken: 500 ml NS Bolus

## 2017-02-15 NOTE — Progress Notes (Signed)
Initial Nutrition Assessment  DOCUMENTATION CODES:   Not applicable  INTERVENTION:  - Continue Ensure Enlive po BID, each supplement provides 350 kcal and 20 grams of protein - Tech or RN to provide meal assistance if no family is present.  - Continue to encourage PO intakes of meals and supplements.   NUTRITION DIAGNOSIS:   Predicted suboptimal nutrient intake related to acute illness, lethargy/confusion as evidenced by per patient/family report.  GOAL:   Patient will meet greater than or equal to 90% of their needs  MONITOR:   PO intake, Supplement acceptance, Weight trends, Labs  REASON FOR ASSESSMENT:   Malnutrition Screening Tool, Low Braden  ASSESSMENT:   83 y.o. female with chronic diastolic CHF, chronic atrial fibrillation on Coumadin, bullous pemphigoid, presenting to emergency department with main concern of several days duration of progressively worsening lower extremity erythema and edema, tenderness to touch. Please note that patient provides very little history and daughter at bedside was able to assist with more details. Daughter explained that patient gets home health nurse that used to come twice a week and perform wound care on lower extremities and initially patient has been doing well. As patient's lower extremity wounds improved, nurse was coming to the house once a week and daughter reports noticing worsening erythema and swelling, some blisters got worse and ruptured. Patient has history of cellulitis in lower extremities and has been hospitalized for the problem before. Daughter reports subjective fevers and chills, occasional confusion and poor oral intake.  BMI indicates normal weight. No intakes documented since admission. Pt sleeping at this time and noted to be a/o to self and place. Daughter, who is at bedside, provides all information. Pt does not cook but family provides enough food for pt to be able to heat up for herself for several days. Pt does not  have any problems chewing and does not have any problems swallowing foods or beverages but does have difficulty with larger pills; daughter often cuts larger pills in half but pt sometimes still has some difficulty. Pt is able to feed herself with utensils at home but has had much increased weakness since the day of admission and is now requiring assistance with opening items and cutting items to be bite-sized; will alert RN to this as daughter is unsure she will be back for dinner tonight. Daughter states that at home pt eats very well but that she is unsure if she will eat as well today given increased lethargy. She reports that pt did drink a full Ensure this AM and thinks this may have impacted pt's ability to eat much at breakfast.  Physical assessment deferred with respect to pt's comfort as she is sleeping. Per chart review, pt has lost 8 lbs (6% body weight) in the past 13 months; this is not significant for time frame. Daughter does not feel pt has had any weight changes in the past few months.   Medications reviewed; 1 tablet Oscal-D/day, 2000 units vitamin D/day, 50 mcg oral Synthroid/day, daily multivitamin with minerals, 500 mg niacin BID, 20 mEq oral KCl/day. Labs reviewed; K: 3 mmol/L, BUN: 36 mg/dL, creatinine: 1.55 mg/dL, Ca: 7.8 mg/dL, GFR: 28 mL/min.   IVF: NS @ 75 mL/hr.       Diet Order:  Diet regular Room service appropriate? Yes; Fluid consistency: Thin  Skin:     Last BM:  10/24  Height:   Ht Readings from Last 1 Encounters:  02/14/17 5\' 4"  (1.626 m)    Weight:  Wt Readings from Last 1 Encounters:  02/15/17 125 lb (56.7 kg)    Ideal Body Weight:  54.54 kg  BMI:  Body mass index is 21.46 kg/m.  Estimated Nutritional Needs:   Kcal:  1305-1530 (23-27 kcal/kg)  Protein:  45-55 grams  Fluid:  >/= 1.4 L/day  EDUCATION NEEDS:   No education needs identified at this time    Jarome Matin, MS, RD, LDN, CNSC Inpatient Clinical Dietitian Pager #  [phone] After hours/weekend pager # [phone]

## 2017-02-15 NOTE — Progress Notes (Addendum)
Patient ID: Amanda Richardson, female   DOB: 05/11/1923, 83 y.o.   MRN: [id number]    PROGRESS NOTE  Amanda Richardson Amanda Richardson  [name] DOB: 1924-04-17 DOA: 02/14/2017  PCP: Amanda Seashore, MD   Brief Narrative:  83 y.o. female with chronic diastolic CHF, chronic atrial fibrillation on Coumadin, bullous pemphigoid, presenting to emergency department with main concern of several days duration of progressively worsening lower extremity erythema and edema, tenderness to touch. Please note that patient provides very little history and daughter at bedside was able to assist with more details. Daughter explained that patient gets home health nurse that used to come twice a week and perform wound care on lower extremities and initially patient has been doing well. As patient's lower extremity wounds improved, nurse was coming to the house once a week and daughter reports noticing worsening erythema and swelling, some blisters got worse and ruptured. Patient has history of cellulitis in lower extremities and has been hospitalized for the problem before. Daughter reports subjective fevers and chills, occasional confusion and poor oral intake. Patient denies chest pain, no shortness of breath, no specific abdominal or urinary concerns.  Assessment & Plan:  Active Problems: Sepsis secondary to bilateral lower extremity cellulitis - Please note that patient met criteria for sepsis - Sepsis order set in place - cont vanc and Zosyn day #2 - not sure why WBC up this AM, will repeat in AM and will also check am cortisol level  - If persistent fevers and worsening WBC, will repeat sepsis protocol  Atrial fibrillation with RVR - Patient on Coreg at home as well as Coumadin - Coreg has been held due to low blood pressure, continue Coumadin - cont Metoprolol as needed   Acute kidney injury imposed on CKD stage III - Baseline creatinine about 1.2 - Creatinine up on this admission likely prerenal in  etiology - lasix and lisinopril have been on hold - IVF rate lowered to 50cc/hr - pt on regular diet  - repeat BMP in AM  Hypokalemia - continue to supplement  - BMP in AM  Thrombocytopenia - likely reactive - will monitor    DVT prophylaxis: Coumadin  Code Status: Full  Family Communication: Patient at bedside  Disposition Plan: to be determined   Consultants:   None  Procedures:   None  Antimicrobials:   Vancomycin 10/24 -->  Zosyn 10/24 -->  Subjective: Pt reports feeling better.   Objective: Vitals:   02/14/17 2137 02/15/17 0415 02/15/17 0651 02/15/17 1500  BP: 111/63 124/90  103/64  Pulse: (!) 120 (!) 146  100  Resp: (!) 21 (!) 22  (!) 22  Temp:  (!) 103.2 F (39.6 C) (!) 101.3 F (38.5 C) 98.6 F (37 C)  TempSrc: Oral Rectal Rectal Oral  SpO2: 98% 97%  97%  Weight: 56.7 kg (125 lb) 56.7 kg (125 lb)    Height: _0  (1.626 m)       Intake/Output Summary (Last 24 hours) at 02/15/17 1639 Last data filed at 02/15/17 0826  Gross per 24 hour  Intake             1550 ml  Output              100 ml  Net             1450 ml   Filed Weights   02/14/17 2137 02/15/17 0415  Weight: 56.7 kg (125 lb) 56.7 kg (125 lb)    Examination:  General exam:  Appears calm and comfortable  Respiratory system: Clear to auscultation. Respiratory effort normal. Cardiovascular system: RRR. No JVD, murmurs, rubs, gallops or clicks.  Gastrointestinal system: Abdomen is nondistended, soft and nontender. No organomegaly or masses felt. Normal bowel sounds heard. Extremities: Bilateral LE edema and erythema, improving but still with blistering.  Data Reviewed: I have personally reviewed following labs and imaging studies  CBC:  Recent Labs Lab 02/14/17 1555 02/15/17 0558  WBC 17.1* 23.5*  NEUTROABS 15.5*  --   HGB 13.5 11.9*  HCT 39.5 34.2*  MCV 93.4 92.7  PLT 118* 97*   Basic Metabolic Panel:  Recent Labs Lab 02/14/17 1555 02/15/17 0558  NA 138 139  K  3.4* 3.0*  CL 101 108  CO2 23 21*  GLUCOSE 100* 95  BUN 34* 36*  CREATININE 1.48* 1.55*  CALCIUM 8.7* 7.8*   Coagulation Profile:  Recent Labs Lab 02/14/17 2129 02/15/17 0558  INR 2.66 2.67   Urine analysis:    Component Value Date/Time   COLORURINE YELLOW 09/21/2016 0638   APPEARANCEUR CLEAR 09/21/2016 0638   LABSPEC 1.016 09/21/2016 0638   PHURINE 5.0 09/21/2016 0638   GLUCOSEU NEGATIVE 09/21/2016 0638   HGBUR MODERATE (A) 09/21/2016 0638   BILIRUBINUR NEGATIVE 09/21/2016 0638   KETONESUR NEGATIVE 09/21/2016 0638   PROTEINUR 100 (A) 09/21/2016 0638   UROBILINOGEN 4.0 (H) 08/04/2014 1608   NITRITE NEGATIVE 09/21/2016 [place] 09/21/2016 6435   Radiology Studies: No results found.  Scheduled Meds: . acetaminophen  650 mg Oral Once  . calcium-vitamin D  1 tablet Oral QPM  . cholecalciferol  2,000 Units Oral QPM  . feeding supplement (ENSURE ENLIVE)  237 mL Oral BID BM  . latanoprost  1 drop Both Eyes QHS  . levothyroxine  50 mcg Oral QAC breakfast  . multivitamin with minerals  1 tablet Oral QPM  . niacin  500 mg Oral BID WC  . potassium chloride SA  20 mEq Oral Daily  . timolol  1 drop Both Eyes Daily  . warfarin  1.5 mg Oral ONCE-1800  . Warfarin - Pharmacist Dosing Inpatient   Does not apply q1800   Continuous Infusions: . sodium chloride 75 mL/hr at 02/14/17 2200  . piperacillin-tazobactam (ZOSYN)  IV Stopped (02/15/17 1456)  . [START ON 02/16/2017] vancomycin       LOS: 1 day   Time spent: 25 minutes   Faye Ramsay, MD [facility] Pager [phone]  If 7PM-7AM, please contact night-coverage [email or web address] Password [facility] 02/15/2017, 4:39 PM

## 2017-02-16 LAB — CBC
HCT: 36.5 % (ref 36.0–46.0)
HEMOGLOBIN: 12.5 g/dL (ref 12.0–15.0)
MCH: 31.9 pg (ref 26.0–34.0)
MCHC: 34.2 g/dL (ref 30.0–36.0)
MCV: 93.1 fL (ref 78.0–100.0)
Platelets: 104 10*3/uL — ABNORMAL LOW (ref 150–400)
RBC: 3.92 MIL/uL (ref 3.87–5.11)
RDW: 16 % — ABNORMAL HIGH (ref 11.5–15.5)
WBC: 26 10*3/uL — AB (ref 4.0–10.5)

## 2017-02-16 LAB — BASIC METABOLIC PANEL
ANION GAP: 11 (ref 5–15)
BUN: 49 mg/dL — ABNORMAL HIGH (ref 6–20)
CHLORIDE: 111 mmol/L (ref 101–111)
CO2: 20 mmol/L — ABNORMAL LOW (ref 22–32)
CREATININE: 1.81 mg/dL — AB (ref 0.44–1.00)
Calcium: 8.1 mg/dL — ABNORMAL LOW (ref 8.9–10.3)
GFR calc non Af Amer: 23 mL/min — ABNORMAL LOW (ref >60)
GFR, EST AFRICAN AMERICAN: 27 mL/min — AB (ref >60)
Glucose, Bld: 115 mg/dL — ABNORMAL HIGH (ref 65–99)
Potassium: 3.3 mmol/L — ABNORMAL LOW (ref 3.5–5.1)
SODIUM: 142 mmol/L (ref 135–145)

## 2017-02-16 LAB — CORTISOL: CORTISOL PLASMA: 50.5 ug/dL

## 2017-02-16 LAB — LACTIC ACID, PLASMA: LACTIC ACID, VENOUS: 1.8 mmol/L (ref 0.5–1.9)

## 2017-02-16 LAB — PROTIME-INR
INR: 3.83
PROTHROMBIN TIME: 37.4 s — AB (ref 11.4–15.2)

## 2017-02-16 MED ORDER — DIGOXIN 0.25 MG/ML IJ SOLN
0.2500 mg | Freq: Once | INTRAMUSCULAR | Status: AC
  Administered 2017-02-16: 0.25 mg via INTRAVENOUS
  Filled 2017-02-16: qty 1

## 2017-02-16 MED ORDER — SODIUM CHLORIDE 0.9 % IV BOLUS (SEPSIS)
500.0000 mL | Freq: Once | INTRAVENOUS | Status: AC
  Administered 2017-02-16: 500 mL via INTRAVENOUS

## 2017-02-16 MED ORDER — POTASSIUM CHLORIDE CRYS ER 20 MEQ PO TBCR
40.0000 meq | EXTENDED_RELEASE_TABLET | Freq: Once | ORAL | Status: AC
  Administered 2017-02-16: 40 meq via ORAL
  Filled 2017-02-16: qty 2

## 2017-02-16 MED ORDER — VANCOMYCIN HCL IN DEXTROSE 750-5 MG/150ML-% IV SOLN: 750.0000 mg | INTRAVENOUS | Status: DC

## 2017-02-16 NOTE — Evaluation (Signed)
Physical Therapy Evaluation Patient Details Name: Amanda Richardson MRN: [id number] DOB: 1924-03-13 Today's Date: 02/16/2017   History of Present Illness  83 y.o. female with chronic diastolic CHF, chronic atrial fibrillation on Coumadin, bullous pemphigoid, CAD, CABG, CVA, ischemic bowel disease and admitted for sepsis due to bilateral LE cellulitis  Clinical Impression  Pt admitted with above diagnosis. Pt currently with functional limitations due to the deficits listed below (see PT Problem List). Pt will benefit from skilled PT to increase their independence and safety with mobility to allow discharge to the venue listed below.  Pt unable to tolerate mobility today due to LE pain as well as generalized weakness.  Pt typically modified independent with mobility prior to admission, living alone with family in/out during the day.  Pt may need SNF upon d/c if family cannot provide current level of assist.  Pt prefers home however.     Follow Up Recommendations SNF;Supervision/Assistance - 24 hour (pt prefers home with family assist, uncertain family will be able to provide current level of care)    Equipment Recommendations  Hospital bed    Recommendations for Other Services       Precautions / Restrictions Precautions Precautions: Fall Precaution Comments: incontinent      Mobility  Bed Mobility Overal bed mobility: Needs Assistance             General bed mobility comments: pt unable to tolerate bed mobility, attempted to assist LEs however too painful per pt, pt did long sit in bed with mod assist to raise trunk upright however very anxious; required total assist for repositioning, pt with heels touching bed on arrival so educated pt on floating heels and applied another pillow, RN to order prevalon boots  Transfers                 General transfer comment: NT - pt unable to tolerate due to pain and weakness at this time  Ambulation/Gait                 Stairs            Wheelchair Mobility    Modified Rankin (Stroke Patients Only)       Balance                                             Pertinent Vitals/Pain Pain Assessment: Faces Faces Pain Scale: Hurts little more Pain Location: bil LEs tender to touch Pain Descriptors / Indicators: Tender Pain Intervention(s): Limited activity within patient's tolerance;Monitored during session;Repositioned    Home Living Family/patient expects to be discharged to:: Private residence Living Arrangements: Alone Available Help at Discharge: Family;Available 24 hours/day Type of Home: House Home Access: Stairs to enter Entrance Stairs-Rails: Right Entrance Stairs-Number of Steps: 2 Home Layout: One level Home Equipment: Walker - 2 wheels;Cane - single point;Bedside commode;Shower seat;Grab bars - toilet;Grab bars - tub/shower;Hand held shower head;Wheelchair - manual (lift chair) Additional Comments: family in/out during the day    Prior Function Level of Independence: Needs assistance;Independent with assistive device(s)   Gait / Transfers Assistance Needed: was ambulating with RW  ADL's / Homemaking Assistance Needed: some assist for ADLs        Hand Dominance        Extremity/Trunk Assessment   Upper Extremity Assessment Upper Extremity Assessment: Generalized weakness (L grip stronger then R grip)  Lower Extremity Assessment Lower Extremity Assessment: Generalized weakness (unable to lift LEs against gravity, unable to perform PROM due to bil LE pain to touch)       Communication   Communication: HOH  Cognition Arousal/Alertness: Awake/alert Behavior During Therapy: WFL for tasks assessed/performed Overall Cognitive Status: Within Functional Limits for tasks assessed                                        General Comments General comments (skin integrity, edema, etc.): LEs with edema and redness, fragile skin     Exercises     Assessment/Plan    PT Assessment Patient needs continued PT services  PT Problem List Decreased strength;Decreased mobility;Pain;Decreased balance;Decreased knowledge of use of DME;Decreased activity tolerance       PT Treatment Interventions DME instruction;Gait training;Therapeutic exercise;Therapeutic activities;Functional mobility training;Patient/family education;Wheelchair mobility training    PT Goals (Current goals can be found in the Care Plan section)  Acute Rehab PT Goals PT Goal Formulation: With patient Time For Goal Achievement: 03/02/17 Potential to Achieve Goals: Fair    Frequency Min 3X/week   Barriers to discharge        Co-evaluation               AM-PAC PT "6 Clicks" Daily Activity  Outcome Measure Difficulty turning over in bed (including adjusting bedclothes, sheets and blankets)?: Unable Difficulty moving from lying on back to sitting on the side of the bed? : Unable Difficulty sitting down on and standing up from a chair with arms (e.g., wheelchair, bedside commode, etc,.)?: Unable Help needed moving to and from a bed to chair (including a wheelchair)?: Total Help needed walking in hospital room?: Total Help needed climbing 3-5 steps with a railing? : Total 6 Click Score: 6    End of Session   Activity Tolerance: Patient limited by pain Patient left: in bed;with call bell/phone within reach;with bed alarm set Nurse Communication: Mobility status PT Visit Diagnosis: Difficulty in walking, not elsewhere classified (R26.2);Muscle weakness (generalized) (M62.81)    Time: 3419-3790 PT Time Calculation (min) (ACUTE ONLY): 22 min   Charges:   PT Evaluation $PT Eval Low Complexity: 1 Low     PT G Codes:        Carmelia Bake, PT, DPT 02/16/2017 Pager: [phone]  York Ram E 02/16/2017, 3:05 PM

## 2017-02-16 NOTE — Clinical Social Work Note (Signed)
Clinical Social Work Assessment  Patient Details  Name: Amanda Richardson MRN: [id number] Date of Birth: 1923/10/24  Date of referral:  02/16/17               Reason for consult:  Discharge Planning                Permission sought to share information with:    Permission granted to share information::     Name::        Agency::     Relationship::     Contact Information:     Housing/Transportation Living arrangements for the past 2 months:  Single Family Home Source of Information:  Adult Children Daleen Snook [phone]) Patient Interpreter Needed:  None Criminal Activity/Legal Involvement Pertinent to Current Situation/Hospitalization:  No - Comment as needed Significant Relationships:  Adult Children Lives with:  Self Do you feel safe going back to the place where you live?  Yes Need for family participation in patient care:  Yes (Comment)  Care giving concerns:  Patient resides alone and is currently receiving home health services from [place]. Patient's daughter reported that she feels patient needs a higher level of care. Patient's daughter reported that patient has been to SNF for rehab in the past and feels needs a higher level of care than SNF. Patient's daughter reported that ideally patient would return home with 24 hour supervision from patient's daughter in law who is a Therapist, sports. Patient's daughter reported that after patient was discharged from rehab in August 2018, she returned home with 24 hour supervision from daughter in law for 2 weeks. Patient's daughter reported that after that patient has been living alone since then and that she was doing well before this Wednesday. Patient's daughter reported that patient was fixing her breakfast, warming up food, washing dishes and using the toilet independently prior to hospitalization. Patient's daughter reported that her sister lives around the corner from patient and that they are both at patient's home often.    Social Worker  assessment / plan:  CSW spoke with patient's daughter at bedside regarding discharge plans, patient asleep. Patient's daughter reported that she is unaware if they want to pursue rehab for patient and prefers for patient to return home with 24 hour care if possible.   PT ordered, no evaluation at this time. CSW will follow up with patient/patient's daughter after PT recommendation is made to assist with discharge planning, if needed.  Employment status:  Retired Nurse, adult PT Recommendations:  Not assessed at this time [place] / Referral to community resources:  [place]  Patient/Family's Response to care:  Patient's daughter uncertain about discharge plans, verbalized preference for patient to return home with 24 hour care from patient's daughter in Sports coach. Patient's daughter appreciative of CSW assistance with discharge planning.   Patient/Family's Understanding of and Emotional Response to Diagnosis, Current Treatment, and Prognosis:  Patient's daughter verbalized strong understanding of patient's diagnosis and current treatment. Patient's daughter reported that she was uncertain about patient's discharge plans, noting that patient would prefer to return home and that independence is important to patient. Patient's daughter reported feeling overwhelmed due to her sister's inability to accompany to the hospital, CSW validated patient's daughter's feelings and provided emotional support.   Emotional Assessment Appearance:  Appears stated age Attitude/Demeanor/Rapport:  Unable to Assess Affect (typically observed):  Unable to Assess Orientation:  Oriented to Self, Oriented to Place Alcohol / Substance use:  Not Applicable Psych involvement (Current and /or  in the community):  No (Comment)  Discharge Needs  Concerns to be addressed:  Care Coordination Readmission within the last 30 days:  No Current discharge risk:  Lives alone Barriers to  Discharge:  Continued Medical Work up   [employer], LCSW 02/16/2017, 11:31 AM

## 2017-02-16 NOTE — Progress Notes (Signed)
Patient ID: ARLOA PRAK, female   DOB: 05-13-1923, 83 y.o.   MRN: [id number]    PROGRESS NOTE  Virlee Stroschein Grein  [id number] DOB: 03-16-1924 DOA: 02/14/2017  PCP: Merrilee Seashore, MD   Brief Narrative:  83 y.o. female with chronic diastolic CHF, chronic atrial fibrillation on Coumadin, bullous pemphigoid, presenting to emergency department with main concern of several days duration of progressively worsening lower extremity erythema and edema, tenderness to touch. Please note that patient provides very little history and daughter at bedside was able to assist with more details. Daughter explained that patient gets home health nurse that used to come twice a week and perform wound care on lower extremities and initially patient has been doing well. As patient's lower extremity wounds improved, nurse was coming to the house once a week and daughter reports noticing worsening erythema and swelling, some blisters got worse and ruptured. Patient has history of cellulitis in lower extremities and has been hospitalized for the problem before. Daughter reports subjective fevers and chills, occasional confusion and poor oral intake. Patient denies chest pain, no shortness of breath, no specific abdominal or urinary concerns.  Assessment & Plan:  Active Problems: Sepsis secondary to bilateral lower extremity cellulitis - Please note that patient met criteria for sepsis - Sepsis order set in place - cont Zosyn day #3 - WBC still elevated, pt reports recent history of diarrhea but has not reported this on admission - daughter was also not aware of this - asked RN to watch and obtain C. Diff and diarrhea noted  - daughter reported that pt has been on multiple ABX in the recent past so high risk for C. Diff   Atrial fibrillation with RVR - Patient on Coreg at home as well as Coumadin - Coreg has been held due to low blood pressure, continue Coumadin - cont Metoprolol as needed   Acute  kidney injury imposed on CKD stage III - Baseline creatinine about 1.2 - Creatinine up on this admission likely prerenal in etiology - lasix and lisinopril on hold - BMP in AM - also stop Vancomycin   Hypokalemia - continue to supplement  - BMP in AM  Thrombocytopenia - likely reactive - will monitor    DVT prophylaxis: Coumadin  Code Status: Full  Family Communication: daughter at bedside  Disposition Plan: to be determined   Consultants:   None  Procedures:   None  Antimicrobials:   Vancomycin 10/24 --> 10/26  Zosyn 10/24 -->  Subjective: Pt reports more diarrhea this AM.   Objective: Vitals:   02/16/17 0254 02/16/17 0431 02/16/17 0500 02/16/17 0610  BP: 100/63 109/68  (!) 94/57  Pulse:    93  Resp:    (!) 22  Temp:    98.9 F (37.2 C)  TempSrc:    Oral  SpO2:    95%  Weight:   57 kg (125 lb 10.6 oz)   Height:        Intake/Output Summary (Last 24 hours) at 02/16/17 0857 Last data filed at 02/16/17 0600  Gross per 24 hour  Intake            817.5 ml  Output                0 ml  Net            817.5 ml   Filed Weights   02/14/17 2137 02/15/17 0415 02/16/17 0500  Weight: 56.7 kg (125 lb) 56.7 kg (125 lb) 57  kg (125 lb 10.6 oz)    Physical Exam  Constitutional: Appears calm, NAD CVS: RRR, S1/S2 +, no murmurs, no gallops, no carotid bruit.  Pulmonary: Effort and breath sounds normal, no stridor, rhonchi, wheezes, rales.  Abdominal: Soft. BS +,  no distension, tenderness, rebound or guarding.  Musculoskeletal: Bilateral LE edema and erythema better, bullous lesions noted but stable   Data Reviewed: I have personally reviewed following labs and imaging studies  CBC:  Recent Labs Lab 02/14/17 1555 02/15/17 0558 02/16/17 0422  WBC 17.1* 23.5* 26.0*  NEUTROABS 15.5*  --   --   HGB 13.5 11.9* 12.5  HCT 39.5 34.2* 36.5  MCV 93.4 92.7 93.1  PLT 118* 97* 790*   Basic Metabolic Panel:  Recent Labs Lab 02/14/17 1555 02/15/17 0558  02/16/17 0422  NA 138 139 142  K 3.4* 3.0* 3.3*  CL 101 108 111  CO2 23 21* 20*  GLUCOSE 100* 95 115*  BUN 34* 36* 49*  CREATININE 1.48* 1.55* 1.81*  CALCIUM 8.7* 7.8* 8.1*   Coagulation Profile:  Recent Labs Lab 02/14/17 2129 02/15/17 0558 02/16/17 0422  INR 2.66 2.67 3.83   Urine analysis:    Component Value Date/Time   COLORURINE YELLOW 09/21/2016 0638   APPEARANCEUR CLEAR 09/21/2016 0638   LABSPEC 1.016 09/21/2016 0638   PHURINE 5.0 09/21/2016 0638   GLUCOSEU NEGATIVE 09/21/2016 0638   HGBUR MODERATE (A) 09/21/2016 0638   BILIRUBINUR NEGATIVE 09/21/2016 [place] 09/21/2016 0638   PROTEINUR 100 (A) 09/21/2016 0638   UROBILINOGEN 4.0 (H) 08/04/2014 1608   NITRITE NEGATIVE 09/21/2016 [place] 09/21/2016 2409   Radiology Studies: No results found.  Scheduled Meds: . calcium-vitamin D  1 tablet Oral QPM  . cholecalciferol  2,000 Units Oral QPM  . feeding supplement (ENSURE ENLIVE)  237 mL Oral BID BM  . latanoprost  1 drop Both Eyes QHS  . levothyroxine  50 mcg Oral QAC breakfast  . multivitamin with minerals  1 tablet Oral QPM  . niacin  500 mg Oral BID WC  . potassium chloride SA  20 mEq Oral Daily  . timolol  1 drop Both Eyes Daily  . Warfarin - Pharmacist Dosing Inpatient   Does not apply q1800   Continuous Infusions: . sodium chloride 50 mL/hr at 02/16/17 0642  . piperacillin-tazobactam (ZOSYN)  IV Stopped (02/16/17 0152)  . vancomycin       LOS: 2 days   Time spent: 25 minutes   Faye Ramsay, MD [facility] Pager [phone]  If 7PM-7AM, please contact night-coverage [email or web address] Password [email or web address] 02/16/2017, 8:57 AM

## 2017-02-16 NOTE — Progress Notes (Signed)
[place] NOTE   Pharmacy Consult for Warfarin and vanc/zosyn for cellulitis Indication: atrial fibrillation and DVT and Mitral Valve replacement  Allergies  Allergen Reactions  . Heparin Anaphylaxis  . Codeine Other (See Comments)    Unknown   . Tape     Plastic tape causes skin tears and blisters   Patient Measurements: Height: 5\' 4"  (162.6 cm) Weight: 125 lb 10.6 oz (57 kg) IBW/kg (Calculated) : 54.7 TBW: 56.7 kg  Vital Signs: Temp: 98.9 F (37.2 C) (10/26 0610) Temp Source: Oral (10/26 0610) BP: 94/57 (10/26 0610) Pulse Rate: 93 (10/26 0610)  Labs:  Recent Labs  02/14/17 1555 02/14/17 2129 02/14/17 2256 02/15/17 0558 02/16/17 0422  HGB 13.5  --   --  11.9* 12.5  HCT 39.5  --   --  34.2* 36.5  PLT 118*  --   --  97* 104*  APTT  --   --  36  --   --   LABPROT  --  28.1*  --  28.2* 37.4*  INR  --  2.66  --  2.67 3.83  CREATININE 1.48*  --   --  1.55* 1.81*   Estimated Creatinine Clearance: 17.5 mL/min (A) (by C-G formula based on SCr of 1.81 mg/dL (H)).  Assessment: 46 yoF, to ED 10/24 for LE cellulitis, Vancomycin & Zosyn ordered per Rx  Hx of Mitral Valve replacement, hx DVT, Afib - Warfarin 1.5 mg Tu,Th,Su; 3mg  other days - family member noted patient took 3mg  on 10/23 instead of 1.5mg   02/16/2017 INR slightly supratherapeutic at 3.83 (goal 2.5-3.5), Hg 13.5>>11.9>12.5, pltc Low at 104, 118 on admit, PLTC 158 in August but 117 in May &  June 2018.  No bleeding reported.  ID: WBC 17.1>>23.5>26, lactate 3.2?1.8 T max 101.3 on 10/25, AF since., Cr rising to 1.81 today, Blood cx in progress.   Goal of Therapy:  INR goal 2.5-3.5 Monitor platelets by anticoagulation protocol: Yes   Plan:   No coumadin today  Daily Protime/INR  Monitor CBC, s/s bleed  decrease vancomycin 1 gm IV q48 to 750 mg IV q48  Continue Zosyn 2.25 gm IV q8h  Eudelia Bunch, Pharm.D. [phone] 02/16/2017 7:32 AM

## 2017-02-16 NOTE — Evaluation (Signed)
Clinical/Bedside Swallow Evaluation Patient Details  Name: Amanda Richardson MRN: [id number] Date of Birth: March 09, 1924  Today's Date: 02/16/2017 Time: SLP Start Time (ACUTE ONLY): 7564 SLP Stop Time (ACUTE ONLY): 3329 SLP Time Calculation (min) (ACUTE ONLY): 20 min  Past Medical History:  Past Medical History:  Diagnosis Date  . Allergic rhinitis   . Anemia    not applicable since [id number] when pt had hysterectomy   . Basal cell carcinoma   . Bilateral lower extremity edema    history of venous insufficiency  . Bullous pemphigoid   . Chronic anticoagulation   . Chronic atrial fibrillation ([place])   . Chronic diastolic CHF (congestive heart failure) ([place])   . Coronary artery disease    a. CABG 2001 at time of MVR.  Marland Kitchen DVT (deep venous thrombosis) ([place])    possible in 1990s and does not remember being on blood thinner  . Glaucoma   . H/O mitral valve replacement 2001   mechanical heart valve; family states indication may have been MR from MVP or  rheumatic heart disease  . Hypertension   . Hypothyroidism   . Ischemic bowel disease ([place])    missing 2/3 of bowel  . S/p nephrectomy    left  . Stroke [facility])    a. CT head 2018 showed old left anterior watershed infarct, old right caudate lacunar infarct.  . Thrombocytopenia ([place])   . Urinary incontinence    Past Surgical History:  Past Surgical History:  Procedure Laterality Date  . ABDOMINAL HYSTERECTOMY  1960s  . APPENDECTOMY  1960s  . CORONARY ARTERY BYPASS GRAFT  01/2000  . HERNIA REPAIR    . HIP ARTHROPLASTY Left 12/18/2015   Procedure: HEMIARTHROPLASTY  HIP;  Surgeon: Tania Ade, MD;  Location: [facility];  Service: Orthopedics;  Laterality: Left;  . hx broken collarbone    . LAPAROSCOPIC SMALL BOWEL RESECTION  2004 or 5   adhesions and ischemic bowel  . MITRAL VALVE REPLACEMENT  01/2000   mechanical heart valve  . TONSILLECTOMY  1933  . TRANSTHORACIC ECHOCARDIOGRAM  02/2012   EF 60-65%, mild LVH; mildly thickened AV  leaflets with sclerosis and moderate regurg; mechanical MV prosthesis; LA dilated; RVSP increased; RA severely dilated; increased LA pressure; moderate TR   HPI:  83 year old female admitted 02/14/17 with increasing lower extremity swelling and tenderness. PMH significant for chronic diastolic CHF, afib, bullous pemphigoid, cellulitis, CVA   Assessment / Plan / Recommendation Clinical Impression  Pt presents with adequate oral motor strength and function. Pt had BSE in April 2016, with suspected primary esophageal issues. Diet recommendation for regular solids and thin liquids.   Pt appeared to tolerate all consistencies tested today (thin, puree, solid) without overt s/s aspiration or change in respiratory status. Pt endurance is low at this time, and would benefit from soft foods with chopped meats for energy conservation, and thin liquids. Recommend meds be crushed in puree if able. If unable to crush, please give one at a time. Safe swallow precautions were posted at [facility]. ST will follow for assessment of diet tolerance and education.    SLP Visit Diagnosis: Dysphagia, unspecified (R13.10)    Aspiration Risk  Mild aspiration risk    Diet Recommendation Thin liquid;Dysphagia 3 (Mech soft) (chop meats for energy conservation)   Liquid Administration via: Cup;Straw Medication Administration: Crushed with puree Supervision: Patient able to self feed Compensations: Minimize environmental distractions;Slow rate;Small sips/bites Postural Changes: Seated upright at 90 degrees;Remain upright for at least 30  minutes after po intake    Other  Recommendations Oral Care Recommendations: Oral care BID   Follow up Recommendations None      Frequency and Duration min 1 x/week  1 week;2 weeks       Prognosis Prognosis for Safe Diet Advancement: Good      Swallow Study   General Date of Onset: 02/14/17 HPI: 83 year old female admitted 02/14/17 with increasing lower extremity swelling and  tenderness. PMH significant for chronic diastolic CHF, afib, bullous pemphigoid, cellulitis, CVA Type of Study: Bedside Swallow Evaluation Previous Swallow Assessment: BSE April 2016 - suspect primary esophageal  Diet Prior to this Study: Regular;Thin liquids Temperature Spikes Noted: No Respiratory Status: Room air History of Recent Intubation: No Behavior/Cognition: Alert;Cooperative;Pleasant mood Oral Cavity Assessment: Within Functional Limits Oral Care Completed by SLP: No Oral Cavity - Dentition: Adequate natural dentition Vision: Functional for self-feeding Self-Feeding Abilities: Able to feed self Patient Positioning: Upright in bed Baseline Vocal Quality: Normal Volitional Cough: Weak Volitional Swallow: Able to elicit    Oral/Motor/Sensory Function Overall Oral Motor/Sensory Function: Within functional limits   Ice Chips Ice chips: Not tested   Thin Liquid Thin Liquid: Within functional limits Presentation: Straw    Nectar Thick Nectar Thick Liquid: Not tested   Honey Thick Honey Thick Liquid: Not tested   Puree Puree: Within functional limits Presentation: Spoon   Solid   GO   Solid: Within functional limits Presentation: [place], [facility], [place] Speech Language Pathologist [phone]  Shonna Chock 02/16/2017,1:50 PM

## 2017-02-16 NOTE — Progress Notes (Signed)
Central Tele called RN to notify her of pt heart rate going into the 140-150's about 10-12 times out of the last 10 minutes. Patient was not sustaining at that level but mostly staying around the 120-130's. RN went to go check patient's vitals and EKG. Pt's BP was 82/53, heart rate in the 140's, and respiratory rate increased. EKG showed A.Fib with RVR. RN notified NP on call. NP on call put in two doses of digoxin (second dose to give 15 minutes after the first if the heart rate still hadn't come under 120), NP also ordered a 500cc bolus. Patient has history of CHF so bolus wasn't run too fast. After bolus was complete, RN checked vitals on patient again and did a repeat EKG. Blood pressure had improved a slight bit and EKG still showed A.Fib with RVR. RN notified NP on call, no further measures were taken. RN kept a close watch on patient and continued to monitor blood pressure, heart rate, and rhythm. Will update day shift RN on patient's night.  Carmela Hurt, RN 02/16/17 7:00 AM

## 2017-02-16 NOTE — Progress Notes (Signed)
Spoke with pt's daughter this AM who states that she would like to know about SNF, that she was not sure if she could help pt at home. Pt to eval.

## 2017-02-17 ENCOUNTER — Inpatient Hospital Stay (HOSPITAL_COMMUNITY): Payer: Medicare Other

## 2017-02-17 DIAGNOSIS — L03115 Cellulitis of right lower limb: Secondary | ICD-10-CM

## 2017-02-17 LAB — BASIC METABOLIC PANEL
ANION GAP: 9 (ref 5–15)
BUN: 58 mg/dL — ABNORMAL HIGH (ref 6–20)
CHLORIDE: 112 mmol/L — AB (ref 101–111)
CO2: 21 mmol/L — ABNORMAL LOW (ref 22–32)
Calcium: 8.4 mg/dL — ABNORMAL LOW (ref 8.9–10.3)
Creatinine, Ser: 1.67 mg/dL — ABNORMAL HIGH (ref 0.44–1.00)
GFR calc Af Amer: 30 mL/min — ABNORMAL LOW (ref >60)
GFR, EST NON AFRICAN AMERICAN: 26 mL/min — AB (ref >60)
GLUCOSE: 126 mg/dL — AB (ref 65–99)
POTASSIUM: 4 mmol/L (ref 3.5–5.1)
SODIUM: 142 mmol/L (ref 135–145)

## 2017-02-17 LAB — CBC
HCT: 34.5 % — ABNORMAL LOW (ref 36.0–46.0)
HEMOGLOBIN: 12 g/dL (ref 12.0–15.0)
MCH: 32.3 pg (ref 26.0–34.0)
MCHC: 34.8 g/dL (ref 30.0–36.0)
MCV: 92.7 fL (ref 78.0–100.0)
PLATELETS: 95 10*3/uL — AB (ref 150–400)
RBC: 3.72 MIL/uL — AB (ref 3.87–5.11)
RDW: 15.9 % — ABNORMAL HIGH (ref 11.5–15.5)
WBC: 25.6 10*3/uL — AB (ref 4.0–10.5)

## 2017-02-17 LAB — LACTIC ACID, PLASMA: Lactic Acid, Venous: 1.7 mmol/L (ref 0.5–1.9)

## 2017-02-17 LAB — PROTIME-INR
INR: 4.62
Prothrombin Time: 43.3 seconds — ABNORMAL HIGH (ref 11.4–15.2)

## 2017-02-17 LAB — MAGNESIUM: MAGNESIUM: 2.1 mg/dL (ref 1.7–2.4)

## 2017-02-17 MED ORDER — ACETAMINOPHEN 650 MG RE SUPP
650.0000 mg | RECTAL | Status: DC | PRN
  Administered 2017-02-17: 650 mg via RECTAL
  Filled 2017-02-17: qty 1

## 2017-02-17 MED ORDER — IOPAMIDOL (ISOVUE-300) INJECTION 61%
INTRAVENOUS | Status: AC
  Administered 2017-02-17: 15 mL
  Filled 2017-02-17: qty 30

## 2017-02-17 MED ORDER — IOPAMIDOL (ISOVUE-300) INJECTION 61%
30.0000 mL | Freq: Once | INTRAVENOUS | Status: DC | PRN
  Administered 2017-02-17: 30 mL via ORAL
  Filled 2017-02-17: qty 30

## 2017-02-17 NOTE — Progress Notes (Signed)
CSW contacted by patient's daughter Daleen Snook [phone]). CSW informed patient's daughter about PT recommendation for SNF, patient's daughter reported that she was unsure about discharge plans for patient and requested that CSW contact her Monday to follow up about discharge plans. CSW explained to patient's daughter that patient's insurance required prior authorization and that it can be lengthy process, patient's daughter verbalized understanding and requested that CSW follow up with her Monday to discuss discharge plans.  CSW will follow up with patient's daughter Monday to discuss discharge plans.  Abundio Miu, [place] Social Worker [facility] Cell#: [phone]

## 2017-02-17 NOTE — Progress Notes (Signed)
CSW contacted patient's daughter to follow up about PT recommendation for SNF, no answer. CSW left voicemail requesting return phone call.  Abundio Miu, [place] Social Worker [facility] Cell#: [phone]

## 2017-02-17 NOTE — Evaluation (Signed)
Occupational Therapy Evaluation Patient Details Name: Amanda Richardson MRN: [id number] DOB: 1924-03-14 Today's Date: 02/17/2017    History of Present Illness 83 y.o. female with chronic diastolic CHF, chronic atrial fibrillation on Coumadin, bullous pemphigoid, CAD, CABG, CVA, ischemic bowel disease and admitted for sepsis due to bilateral LE cellulitis   Clinical Impression   Pt admitted with above. She demonstrates the below listed deficits and will benefit from continued OT to maximize safety and independence with BADLs.  Pt with intermittent arousal this date.   She will at times be interactive, then fade out.  Family reports she is less interactive than she has been previously.   She requires max - total A for all aspects of ADLs and total A for mobility (doe not tolerate LE movement due to increased pain).  Prior to admission, pt was living alone, mod I with ADLs and ambulation, and assist for IADLs.   Anticipate she will require SNF.  Family is interested in Palliative Care consult.   If they choose to take her home, she will require 24 hour physical assist, maximal HH services and the below listed DME.   Will follow.       Follow Up Recommendations  SNF;Supervision/Assistance - 24 hour    Equipment Recommendations  3 in 1 bedside commode;Wheelchair (measurements OT);Hospital bed    Recommendations for Other Services       Precautions / Restrictions Precautions Precautions: Fall Precaution Comments: incontinent      Mobility Bed Mobility Overal bed mobility: Needs Assistance             General bed mobility comments: Pt only tolerated HOB to 53*  Transfers                 General transfer comment: Pt unable to tolerate    Balance                                           ADL either performed or assessed with clinical judgement   ADL Overall ADL's : Needs assistance/impaired Eating/Feeding: Maximal assistance;Bed level Eating/Feeding  Details (indicate cue type and reason): Pt will attempt to hold cup, but unable to fully support it.    Grooming: Wash/dry hands;Wash/dry face;Oral care;Brushing hair;Total assistance;Sitting   Upper Body Bathing: Total assistance;Bed level   Lower Body Bathing: Total assistance;Bed level   Upper Body Dressing : Total assistance;Bed level   Lower Body Dressing: Total assistance;Bed level   Toilet Transfer: Total assistance   Toileting- Clothing Manipulation and Hygiene: Total assistance;Bed level       Functional mobility during ADLs: Total assistance General ADL Comments: Pt unable to tolerate moving in bed due to LE pain      Vision         Perception     Praxis      Pertinent Vitals/Pain Pain Assessment: Faces Faces Pain Scale: Hurts whole lot Pain Location: bil LEs tender to touch Pain Descriptors / Indicators: Moaning;Tender;Grimacing;Guarding Pain Intervention(s): Monitored during session;Limited activity within patient's tolerance;Repositioned     Hand Dominance Right   Extremity/Trunk Assessment Upper Extremity Assessment Upper Extremity Assessment: Generalized weakness;RUE deficits/detail;LUE deficits/detail RUE Deficits / Details: 2+/5.  Tremors noted  LUE Deficits / Details: 2+/5.  Tremors noted    Lower Extremity Assessment Lower Extremity Assessment: Generalized weakness   Cervical / Trunk Assessment Cervical / Trunk Assessment: Kyphotic   Communication  Communication Communication: HOH   Cognition Arousal/Alertness: Lethargic;Awake/alert Behavior During Therapy: Flat affect Overall Cognitive Status: Impaired/Different from baseline Area of Impairment: Attention;Following commands;Problem solving                   Current Attention Level: Focused   Following Commands: Follows one step commands inconsistently;Follows one step commands with increased time     Problem Solving: Slow processing;Decreased initiation;Difficulty  sequencing;Requires verbal cues;Requires tactile cues General Comments: Family reports she is less responsive today.  She will mostly nod yes and no to answer questions.   follows one step commands intermittently     General Comments  Bil. LEs with significant Edema.  applied Prevalon boots to bil. LEs    Exercises     Shoulder Instructions      Home Living Family/patient expects to be discharged to:: Private residence Living Arrangements: Alone Available Help at Discharge: Family;Available 24 hours/day Type of Home: House Home Access: Stairs to enter [employer] of Steps: 2 Entrance Stairs-Rails: Right Home Layout: One level     Bathroom Shower/Tub: Occupational psychologist: Standard Bathroom Accessibility: Yes   Home Equipment: Environmental consultant - 2 wheels;Cane - single point;Bedside commode;Shower seat;Grab bars - toilet;Grab bars - tub/shower;Hand held shower head;Wheelchair - manual   Additional Comments: family in/out during the day      Prior Functioning/Environment Level of Independence: Needs assistance;Independent with assistive device(s)  Gait / Transfers Assistance Needed: was ambulating with RW ADL's / Homemaking Assistance Needed: daughter reports she was performing ADLs mod I, required assist with IADLs  Communication / Swallowing Assistance Needed: [place] therapy and RN  come, gets [place] dressings Comments: daughters live very close.  Pt was receiving HH 1x/week         OT Problem List: Decreased strength;Decreased activity tolerance;Decreased range of motion;Impaired balance (sitting and/or standing);Decreased cognition;Decreased coordination;Decreased knowledge of use of DME or AE;Impaired UE functional use;Pain      OT Treatment/Interventions: Self-care/ADL training;DME and/or AE instruction;Therapeutic activities;Cognitive remediation/compensation;Patient/family education;Balance training;Therapeutic exercise    OT Goals(Current goals can be found  in the care plan section) Acute Rehab OT Goals Patient Stated Goal: Pt did not state.  Family hopes she will progress, but also want her comfortable  OT Goal Formulation: With patient/family Time For Goal Achievement: 03/03/17 Potential to Achieve Goals: Fair ADL Goals Pt Will Perform Eating: with mod assist;sitting Pt Will Perform Grooming: with mod assist;sitting Pt Will Perform Upper Body Bathing: with mod assist;sitting Pt Will Transfer to Toilet: with max assist;stand pivot transfer;bedside commode  OT Frequency: Min 2X/week   Barriers to D/C: Decreased caregiver support          Co-evaluation              AM-PAC PT "6 Clicks" Daily Activity     Outcome Measure Help from another person eating meals?: A Lot Help from another person taking care of personal grooming?: Total Help from another person toileting, which includes using toliet, bedpan, or urinal?: Total Help from another person bathing (including washing, rinsing, drying)?: Total Help from another person to put on and taking off regular upper body clothing?: Total Help from another person to put on and taking off regular lower body clothing?: Total 6 Click Score: 7   End of Session Nurse Communication: Mobility status  Activity Tolerance: Patient limited by lethargy Patient left: in bed;with family/visitor present  OT Visit Diagnosis: Unsteadiness on feet (R26.81);Pain Pain - Right/Left: Right Pain - part of body: Leg  Time: 8648-4720 OT Time Calculation (min): 49 min Charges:  OT General Charges $OT Visit: 1 Visit OT Evaluation $OT Eval Moderate Complexity: 1 Mod OT Treatments $Self Care/Home Management : 8-22 mins $Therapeutic Activity: 8-22 mins G-Codes:     [facility], OTR/L [phone]   Lucille Passy M 02/17/2017, 1:13 PM

## 2017-02-17 NOTE — Progress Notes (Signed)
Palliative Medicine Team consult was received.   I called and spoke with patient's son-in-law Richardson Landry).  His wife Helyn Numbers- patient's daughter who is listed as number one on HCPOA) is currently admitted to the hospital.  He asked me to call patient's other daughter, Daleen Snook, to set up a meeting.  I called and spoke with Daleen Snook on the phone.  She reports that she believes her sister ([name]) will want to be part of meeting as well.  She will call me and let me know what time that they can meet for family meeting.  We did discuss possibility of meeting in her sister's room to allow her to be part of the conversation (assuming she is still admitted).  Will schedule a meeting with patient and her family at the earliest possible time family can meet and we have a provider available. If there are urgent needs or questions please call [phone]. Thank you for consulting out team to assist with this patients care.  Micheline Rough, MD [place] Palliative Medicine Team [phone]  NO CHARGE NOTE

## 2017-02-17 NOTE — Progress Notes (Signed)
Patient ID: Amanda Richardson, female   DOB: 02/27/1924, 83 y.o.   MRN: [id number]    PROGRESS NOTE  Amanda Richardson  NWG:[id number] DOB: 29-May-1923 DOA: 02/14/2017  PCP: Merrilee Seashore, MD   Brief Narrative:  83 y.o. female with chronic diastolic CHF, chronic atrial fibrillation on Coumadin, bullous pemphigoid, presenting to emergency department with main concern of several days duration of progressively worsening lower extremity erythema and edema, tenderness to touch. Please note that patient provides very little history and daughter at bedside was able to assist with more details. Daughter explained that patient gets home health nurse that used to come twice a week and perform wound care on lower extremities and initially patient has been doing well. As patient's lower extremity wounds improved, nurse was coming to the house once a week and daughter reports noticing worsening erythema and swelling, some blisters got worse and ruptured. Patient has history of cellulitis in lower extremities and has been hospitalized for the problem before. Daughter reports subjective fevers and chills, occasional confusion and poor oral intake. Patient denies chest pain, no shortness of breath, no specific abdominal or urinary concerns.  Assessment & Plan:  Active Problems: Sepsis secondary to bilateral lower extremity cellulitis - Please note that patient met criteria for sepsis - Sepsis order set in place - cont Zosyn day #4 - WBC still elevated, pt reports recent history of diarrhea but has not reported this on admission - daughter was also not aware of this - asked RN to watch and obtain C. Diff but so far no diarrhea  - daughter reported that pt has been on multiple ABX in the recent past so high risk for C. Diff   Atrial fibrillation with RVR - Patient on Coreg at home as well as Coumadin - Coreg has been held due to low blood pressure, continue Coumadin - cont Metoprolol as needed   Acute  kidney injury imposed on CKD stage III - Baseline creatinine about 1.2 - Creatinine up on this admission likely prerenal in etiology - lasix and lisinopril on hold - Cr is slightly better this AM - BMP in AM  Hypokalemia - supplemented and WNL this AM  - BMP in AM  Thrombocytopenia - likely reactive - will monitor    DVT prophylaxis: Coumadin  Code Status: Full  Family Communication: no family at bedside this AM   Disposition Plan: to be determined   Consultants:   None  Procedures:   None  Antimicrobials:   Vancomycin 10/24 --> 10/26  Zosyn 10/24 -->  Subjective: Pt reports feeling better.   Objective: Vitals:   02/16/17 2225 02/17/17 0219 02/17/17 0500 02/17/17 0604  BP: (!) 115/50 (!) 121/59  (!) 138/58  Pulse: (!) 101 (!) 46  94  Resp:  (!) 22  (!) 24  Temp: 98 F (36.7 C) 98.7 F (37.1 C)  99.3 F (37.4 C)  TempSrc: Oral Oral  Oral  SpO2: 97% 94%  94%  Weight:   58.5 kg (128 lb 15.5 oz)   Height:        Intake/Output Summary (Last 24 hours) at 02/17/17 0818 Last data filed at 02/17/17 0600  Gross per 24 hour  Intake             1590 ml  Output                0 ml  Net             1590 ml  Filed Weights   02/15/17 0415 02/16/17 0500 02/17/17 0500  Weight: 56.7 kg (125 lb) 57 kg (125 lb 10.6 oz) 58.5 kg (128 lb 15.5 oz)    Physical Exam  Constitutional: Appears calm, NAD CVS: RRR, S1/S2 +, no murmurs, no gallops, no carotid bruit.  Pulmonary: Effort and breath sounds normal, no stridor, rhonchi, wheezes, rales.  Abdominal: Soft. BS +,  no distension, tenderness, rebound or guarding.  Musculoskeletal: diffuse blisters on both lower extremities, swelling overall better and less erythema   Data Reviewed: I have personally reviewed following labs and imaging studies  CBC:  Recent Labs Lab 02/14/17 1555 02/15/17 0558 02/16/17 0422 02/17/17 0455  WBC 17.1* 23.5* 26.0* 25.6*  NEUTROABS 15.5*  --   --   --   HGB 13.5 11.9* 12.5 12.0    HCT 39.5 34.2* 36.5 34.5*  MCV 93.4 92.7 93.1 92.7  PLT 118* 97* 104* 95*   Basic Metabolic Panel:  Recent Labs Lab 02/14/17 1555 02/15/17 0558 02/16/17 0422 02/17/17 0455  NA 138 139 142 142  K 3.4* 3.0* 3.3* 4.0  CL 101 108 111 112*  CO2 23 21* 20* 21*  GLUCOSE 100* 95 115* 126*  BUN 34* 36* 49* 58*  CREATININE 1.48* 1.55* 1.81* 1.67*  CALCIUM 8.7* 7.8* 8.1* 8.4*  MG  --   --   --  2.1   Coagulation Profile:  Recent Labs Lab 02/14/17 2129 02/15/17 0558 02/16/17 0422 02/17/17 0455  INR 2.66 2.67 3.83 4.62*   Urine analysis:    Component Value Date/Time   COLORURINE YELLOW 09/21/2016 0638   APPEARANCEUR CLEAR 09/21/2016 0638   LABSPEC 1.016 09/21/2016 0638   PHURINE 5.0 09/21/2016 0638   GLUCOSEU NEGATIVE 09/21/2016 0638   HGBUR MODERATE (A) 09/21/2016 0638   BILIRUBINUR NEGATIVE 09/21/2016 0638   KETONESUR NEGATIVE 09/21/2016 0638   PROTEINUR 100 (A) 09/21/2016 0638   UROBILINOGEN 4.0 (H) 08/04/2014 1608   NITRITE NEGATIVE 09/21/2016 [place] 09/21/2016 5449   Radiology Studies: No results found.  Scheduled Meds: . calcium-vitamin D  1 tablet Oral QPM  . cholecalciferol  2,000 Units Oral QPM  . feeding supplement (ENSURE ENLIVE)  237 mL Oral BID BM  . latanoprost  1 drop Both Eyes QHS  . levothyroxine  50 mcg Oral QAC breakfast  . multivitamin with minerals  1 tablet Oral QPM  . niacin  500 mg Oral BID WC  . potassium chloride SA  20 mEq Oral Daily  . timolol  1 drop Both Eyes Daily  . Warfarin - Pharmacist Dosing Inpatient   Does not apply q1800   Continuous Infusions: . sodium chloride 50 mL/hr at 02/17/17 0219  . piperacillin-tazobactam (ZOSYN)  IV Stopped (02/17/17 0138)     LOS: 3 days   Time spent: 25 minutes   Faye Ramsay, MD [facility] Pager [phone]  If 7PM-7AM, please contact night-coverage [email or web address] Password [email or web address] 02/17/2017, 8:18 AM

## 2017-02-17 NOTE — Consult Note (Signed)
[place] Nurse wound consult note Reason for Consult: Patient known to me from a previous consult this summer (August) for similar presentation of bullous pemphigus. Today, patient is 24-28 hours status post being found immobile for several hours, half on bed and half off.  Buttocks shearing against mattress. (Photo of position provided  By her daughter, Daleen Snook.) Wound type: Shear, pressure (coccyx DTPI (deep tissue pressure injury).  Moisture (incontinence).  Autoimmune condition: bullous pemphigoid. Pressure Injury POA: Yes, coccygeal  Measurement: Coccygeal:  7cm x 5cm with deep purple hued area in center measuring 4cm x 3cm.  Skin peeling at the periphery with small amount of seropus exudate.  Presentation is consistent with deep tissue pressure injury (DTPI). Ruptured blisters at the left medial thigh, left hip, right hip:  The left medial thigh is the most severe of these ruptured blisters measuring 10cm x 8cm x 0.1cm and with moderate amount of serous exudate. The right hip blister is 50% resolved and now measures 3cm x 3.5cm x 0.1cm. All areas of ruptured blisters are pink, moist. Wound bed:As described above Drainage (amount, consistency, odor) As described above Periwound: intact, pale, dry. Dressing procedure/placement/frequency: As for last admission, the patient will be provided with a mattress replacement with low air loss feature to manage microclimate as well as reduce friction, shear and pressure.  Prevalon boots are in place and her LEs are without blisters or pressure injuries at this time.  The bullous pemphigus at the medial left thigh, bilateral hips are to be dressed with Mepitel, a non-adherent dressings topped with ABDs.  No tape is to be used on skin. Nursing is asked to be creative in securing dressings and to use mesh briefs or adult briefs or Kerlix roll gauze to secure dressings.  Noted is Palliative Care Consultation in Progress.  [facility] nursing team will not follow, but will  remain available to this patient, the nursing and medical teams.  Please re-consult if needed. Thanks, Maudie Flakes, MSN, RN, [place], Arther Abbott  Pager# [phone]

## 2017-02-17 NOTE — Progress Notes (Signed)
CRITICAL VALUE ALERT  Critical Value:  INR 4.92  Date & Time Notied:  10/27 0610  Provider Notified: X. Kennon Holter, NP

## 2017-02-17 NOTE — Progress Notes (Signed)
[facility] for Warfarin and vanc/zosyn for cellulitis Indication: mechanical MVR; atrial fib, h/o DVT  Allergies  Allergen Reactions  . Heparin Anaphylaxis  . Codeine Other (See Comments)    Unknown   . Tape     Plastic tape causes skin tears and blisters   Patient Measurements: Height: 5\' 4"  (162.6 cm) Weight: 128 lb 15.5 oz (58.5 kg) IBW/kg (Calculated) : 54.7 TBW: 56.7 kg  Vital Signs: Temp: 99.3 F (37.4 C) (10/27 0604) Temp Source: Oral (10/27 0604) BP: 138/58 (10/27 0604) Pulse Rate: 94 (10/27 0604)  Labs:  Recent Labs  02/14/17 2256 02/15/17 0558 02/16/17 0422 02/17/17 0455  HGB  --  11.9* 12.5 12.0  HCT  --  34.2* 36.5 34.5*  PLT  --  97* 104* 95*  APTT 36  --   --   --   LABPROT  --  28.2* 37.4* 43.3*  INR  --  2.67 3.83 4.62*  CREATININE  --  1.55* 1.81* 1.67*   Estimated Creatinine Clearance: 18.9 mL/min (A) (by C-G formula based on SCr of 1.67 mg/dL (H)).  Assessment: Hx of Mitral Valve replacement, hx DVT, Afib - home warfarin dosage reportedly 1.5 mg Tu,Th,Su; 3mg  other days - family member noted patient took 3mg  on 10/23 instead of 1.5mg   02/17/2017 INR supratherapeutic and still rising despite no warfarin yesterday  Goal of Therapy:  INR goal 2.5-3.5 Dysphagia 3 diet; charted as eating 20 to 25% of meals DDI: broad-spectrum antibiotic (Zosyn) may increase INR on warfarin Hgb WNL Pltc low No bleeding reported in chart note    Plan:   No warfarin today  Daily Protime/INR while inpatient  Follow H/H, Pltc, any reports of bleeding  Clayburn Pert, PharmD, BCPS Pager: [phone] 02/17/2017  10:53 AM

## 2017-02-18 DIAGNOSIS — M79604 Pain in right leg: Secondary | ICD-10-CM

## 2017-02-18 DIAGNOSIS — Z7189 Other specified counseling: Secondary | ICD-10-CM

## 2017-02-18 DIAGNOSIS — M79605 Pain in left leg: Secondary | ICD-10-CM

## 2017-02-18 LAB — BASIC METABOLIC PANEL
Anion gap: 8 (ref 5–15)
BUN: 56 mg/dL — ABNORMAL HIGH (ref 6–20)
CHLORIDE: 111 mmol/L (ref 101–111)
CO2: 22 mmol/L (ref 22–32)
CREATININE: 1.44 mg/dL — AB (ref 0.44–1.00)
Calcium: 8.3 mg/dL — ABNORMAL LOW (ref 8.9–10.3)
GFR calc non Af Amer: 31 mL/min — ABNORMAL LOW (ref >60)
GFR, EST AFRICAN AMERICAN: 36 mL/min — AB (ref >60)
Glucose, Bld: 122 mg/dL — ABNORMAL HIGH (ref 65–99)
POTASSIUM: 3.7 mmol/L (ref 3.5–5.1)
Sodium: 141 mmol/L (ref 135–145)

## 2017-02-18 LAB — PROCALCITONIN: PROCALCITONIN: 24.07 ng/mL

## 2017-02-18 LAB — CBC
HEMATOCRIT: 34.8 % — AB (ref 36.0–46.0)
HEMOGLOBIN: 11.8 g/dL — AB (ref 12.0–15.0)
MCH: 31.7 pg (ref 26.0–34.0)
MCHC: 33.9 g/dL (ref 30.0–36.0)
MCV: 93.5 fL (ref 78.0–100.0)
Platelets: 118 10*3/uL — ABNORMAL LOW (ref 150–400)
RBC: 3.72 MIL/uL — ABNORMAL LOW (ref 3.87–5.11)
RDW: 15.9 % — ABNORMAL HIGH (ref 11.5–15.5)
WBC: 22.2 10*3/uL — ABNORMAL HIGH (ref 4.0–10.5)

## 2017-02-18 LAB — C DIFFICILE QUICK SCREEN W PCR REFLEX
C Diff antigen: NEGATIVE
C Diff interpretation: NOT DETECTED
C Diff toxin: NEGATIVE

## 2017-02-18 LAB — LACTIC ACID, PLASMA: Lactic Acid, Venous: 1.6 mmol/L (ref 0.5–1.9)

## 2017-02-18 LAB — PROTIME-INR
INR: 3.73
Prothrombin Time: 36.6 seconds — ABNORMAL HIGH (ref 11.4–15.2)

## 2017-02-18 MED ORDER — PIPERACILLIN-TAZOBACTAM 3.375 G IVPB
3.3750 g | Freq: Three times a day (TID) | INTRAVENOUS | Status: DC
  Administered 2017-02-18 – 2017-02-21 (×10): 3.375 g via INTRAVENOUS
  Filled 2017-02-18 (×11): qty 50

## 2017-02-18 MED ORDER — FENTANYL CITRATE (PF) 100 MCG/2ML IJ SOLN
12.5000 ug | INTRAMUSCULAR | Status: DC | PRN
  Administered 2017-02-18 – 2017-02-22 (×8): 12.5 ug via INTRAVENOUS
  Filled 2017-02-18 (×10): qty 2

## 2017-02-18 MED ORDER — WARFARIN 0.5 MG HALF TABLET
0.5000 mg | ORAL_TABLET | Freq: Once | ORAL | Status: AC
  Administered 2017-02-18: 0.5 mg via ORAL
  Filled 2017-02-18: qty 1

## 2017-02-18 NOTE — Progress Notes (Signed)
  Speech Language Pathology Treatment: Dysphagia  Patient Details Name: Amanda Richardson MRN: [id number] DOB: [birth year] Today's Date: 02/18/2017 Time: 1150-1205 SLP Time Calculation (min) (ACUTE ONLY): 15 min  Assessment / Plan / Recommendation Clinical Impression  ST follow up for therapeutic diet tolerance.  Chart review indicated that the patient has been febrile and that lungs are clear but diminished.  Intake has been very poor.  Nursing reported some issues strangling given oral contrast yesterday but that when she was completely upright it was not observed.  She is tolerating meds crushed in ice cream well.  Meal observation was completed and the patient was noted to be slow orally but did clear oral cavity (i.e. No residue was seen).  No obvious s/s of aspiration were seen.  The patient was noted to belch after almost every bite/sip suggesting esophageal component.  Recommend continue with the current diet.  ST will continue to follow.     HPI HPI: 83 year old female admitted 02/14/17 with increasing lower extremity swelling and tenderness. PMH significant for chronic diastolic CHF, afib, bullous pemphigoid, cellulitis, CVA      SLP Plan  Continue with current plan of care       Recommendations  Diet recommendations: Dysphagia 3 (mechanical soft);Thin liquid Liquids provided via: Cup;No straw Medication Administration: Crushed with puree Supervision: Trained caregiver to feed patient Compensations: Minimize environmental distractions;Slow rate;Small sips/bites Postural Changes and/or Swallow Maneuvers: Seated upright 90 degrees;Upright 30-60 min after meal                Oral Care Recommendations: Oral care BID Follow up Recommendations: None SLP Visit Diagnosis: Dysphagia, unspecified (R13.10) Plan: Continue with current plan of care       Carlos, MA, [facility] SLP [phone] Lamar Sprinkles 02/18/2017, 12:07 PM

## 2017-02-18 NOTE — Consult Note (Addendum)
Consultation Note Date: 02/18/2017   Patient Name: Amanda Richardson  DOB: 09-04-1923  MRN: [id number]  Age / Sex: 83 y.o., female  PCP: Merrilee Seashore, MD Referring Physician: Theodis Blaze, MD  Reason for Consultation: Establishing goals of care  HPI/Patient Profile: 83 y.o. female  with past medical history of diastolic CHF, atrial fibrillation on Coumadin, mechanical valve replacement, bullous pemphigoid admitted on 02/14/2017 with sepsis related to lower extremity infection.  Palliative consulted for goals of care.  Clinical Assessment and Goals of Care: I met today with patient's daughters.  Her daughter, [name], is currently hospitalized and we met in her room.  Her daughters report that things and are most important to her her independence (she values as above anything else), her family, her faith, and gardening.  They feel that her doctors have been doing a good job explaining things to them and are appreciative of the time this been helping them understand her situation.  Her daughters report that she has continued to have a decline in her cognition as well as functional status of the last several months.  They have been seeing an increase in the amount of hospitalization she has been having and reports a prior hospitalist told her that this would be less likely scenario moving forward.  We discussed clinical course during this hospitalization as well as wishes moving forward in regard to advanced directives.  Concepts specific to code status and rehospitalization discussed.  We discussed difference between a aggressive medical intervention path and a palliative, comfort focused care path.  Values and goals of care important to patient and family were attempted to be elicited.  We discussed that in light of multiple chronic medical problems that have worsened with this acute problem, care should be  focused on interventions that are likely to allow the patient to achieve goal of getting back to home and spending time with family. I discussed with family regarding heroic interventions at the end-of-life and they agree this would not be in line with prior expressed wishes for a natural death or be likely to lead to getting well enough to go back home. They were in agreement with changing CODE STATUS to DO NOT RESUSCITATE.  Concept of Hospice and Palliative Care were discussed  Questions and concerns addressed.   PMT will continue to support holistically.  SUMMARY OF RECOMMENDATIONS   - DNR -Family wishes to continue with current mode of care and see how she does over the next 24-48 hours.  They have been seeing her continue to decline and ultimately their goal is to work on hopefully getting her back home as this would be her wish. -We discussed how hospice may help in goal of getting her back to home.  They are in agreement with meeting with hospice in order to discuss options for discharge once she is medically maximized.  If she does continue to decline rather than improve over the next couple of days, they may also be interested in residential hospice placement.  She lives  in town and family is hoping that hospice and palliative care of [name] will be able to serve their needs.  I placed a referral to care management in order to facilitate this.  Code Status/Advance Care Planning:  DNR   Symptom Management:   Pain: Pain has especially been worse with any movement.  I discussed use of pain medications and they report she has had ill effects to prior pain medications in the past.  We will continue to use Tylenol as needed but I also added on low-dose of fentanyl to be used prior to getting her dressing or bedding changes.  Palliative Prophylaxis:   Delirium Protocol  Psycho-social/Spiritual:   Desire for further Chaplaincy support:no  Additional Recommendations: Education on  Hospice  Prognosis:   < 6 months  Discharge Planning: Home with Hospice most likely when she is medically maximized     Primary Diagnoses: Present on Admission: . Cellulitis   I have reviewed the medical record, interviewed the patient and family, and examined the patient. The following aspects are pertinent.  Past Medical History:  Diagnosis Date  . Allergic rhinitis   . Anemia    not applicable since XIH-03U when pt had hysterectomy   . Basal cell carcinoma   . Bilateral lower extremity edema    history of venous insufficiency  . Bullous pemphigoid   . Chronic anticoagulation   . Chronic atrial fibrillation ([place])   . Chronic diastolic CHF (congestive heart failure) ([place])   . Coronary artery disease    a. CABG 2001 at time of MVR.  Marland Kitchen DVT (deep venous thrombosis) ([place])    possible in 1990s and does not remember being on blood thinner  . Glaucoma   . H/O mitral valve replacement 2001   mechanical heart valve; family states indication may have been MR from MVP or  rheumatic heart disease  . Hypertension   . Hypothyroidism   . 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 S/p nephrectomy    left  . Stroke [facility])    a. CT head 2018 showed old left anterior watershed infarct, old right caudate lacunar infarct.  . Thrombocytopenia ([place])   . Urinary incontinence    Social History   Social History  . Marital status: Widowed    Spouse name: N/A  . Number of children: 2  . Years of education: 85   Social History Main Topics  . Smoking status: Never Smoker  . Smokeless tobacco: Never Used  . Alcohol use No  . Drug use: No  . Sexual activity: Not Asked   Other Topics Concern  . None   Social History Narrative   Two daughters, [name] and [name]  Lives alone in a house with some steps near the mailbox, but otherwise single story.  Does not use a cane or walker or other assist device.  Active and gardens a lot.  Completes ADLS without assistance.      Family History  Problem Relation Age of Onset  . Irregular heart beat Mother   . Other Mother        pellegra  . Heart failure Mother        had very swollen legs??  . Stroke Mother   . Prostate cancer Father        also skin cancer  . Heart attack Daughter   . Heart disease Brother   . Heart attack Brother 17       still alive  Scheduled Meds: . calcium-vitamin D  1 tablet Oral QPM  . cholecalciferol  2,000 Units Oral QPM  . feeding supplement (ENSURE ENLIVE)  237 mL Oral BID BM  . latanoprost  1 drop Both Eyes QHS  . levothyroxine  50 mcg Oral QAC breakfast  . multivitamin with minerals  1 tablet Oral QPM  . niacin  500 mg Oral BID WC  . potassium chloride SA  20 mEq Oral Daily  . timolol  1 drop Both Eyes Daily  . warfarin  0.5 mg Oral ONCE-1800  . Warfarin - Pharmacist Dosing Inpatient   Does not apply q1800   Continuous Infusions: . piperacillin-tazobactam (ZOSYN)  IV     PRN Meds:.acetaminophen, acetaminophen, bisacodyl, clobetasol ointment, fentaNYL (SUBLIMAZE) injection, iopamidol, metoprolol tartrate, ondansetron **OR** ondansetron (ZOFRAN) IV Medications Prior to Admission:  Prior to Admission medications   Medication Sig Start Date End Date Taking? Authorizing Provider  acetaminophen (TYLENOL) 325 MG tablet Take 2 tablets (650 mg total) by mouth every 6 (six) hours as needed for mild pain (or Fever >/= 101). 07/25/16  Yes Theodis Blaze, MD  calcium-vitamin D (OSCAL WITH D) 500-200 MG-UNIT per tablet Take 1 tablet by mouth every evening.    Yes [provider]  carvedilol (COREG) 6.25 MG tablet Take 1 tablet (6.25 mg total) by mouth 2 (two) times daily. 09/22/16 09/22/17 Yes Florencia Reasons, MD  Cholecalciferol 2000 units CAPS Take 1 capsule by mouth every evening.   Yes [provider]  doxycycline (VIBRAMYCIN) 100 MG capsule Take 100 mg by mouth daily.   Yes [provider]  furosemide (LASIX) 40 MG tablet Take 1 tablet (40 mg total) by mouth  every Monday, Wednesday, and Friday. May change to daily if bilateral lower legs start to have fluids retention. 09/22/16  Yes Florencia Reasons, MD  KLOR-CON M20 20 MEQ tablet Take 1 tablet by mouth daily.  08/22/14  Yes [provider]  latanoprost (XALATAN) 0.005 % ophthalmic solution Place 1 drop into both eyes at bedtime.    Yes [provider]  levothyroxine (SYNTHROID, LEVOTHROID) 50 MCG tablet Take 50 mcg by mouth daily. 05/05/14  Yes [provider]  lisinopril (PRINIVIL,ZESTRIL) 10 MG tablet Take 10 mg by mouth daily.   Yes [provider]  Multiple Vitamin (MULTIVITAMIN WITH MINERALS) TABS Take 1 tablet by mouth every evening.    Yes [provider]  niacinamide 500 MG tablet Take 500 mg by mouth 2 (two) times daily with a meal.   Yes [provider]  Probiotic Product (PROBIOTIC PO) Take 1 capsule by mouth every other day.   Yes [provider]  timolol (BETIMOL) 0.5 % ophthalmic solution Place 1 drop into both eyes daily.   Yes [provider]  warfarin (COUMADIN) 3 MG tablet Take 1.5-3 mg by mouth as directed.    Yes [provider]  amoxicillin (AMOXIL) 500 MG capsule Take 1 capsule (500 mg total) by mouth daily. Start after finishing Augmenting Patient not taking: Reported on 02/14/2017 12/05/16   Caren Griffins, MD  bisacodyl (DULCOLAX) 5 MG EC tablet Take 1 tablet (5 mg total) by mouth daily as needed for moderate constipation. 12/05/16   Caren Griffins, MD  clobetasol ointment (TEMOVATE) 1.50 % Apply 1 application topically 2 (two) times daily as needed (blisters for bulous pemphigoid).     [provider]  HYDROcodone-acetaminophen (NORCO/VICODIN) 5-325 MG tablet Take 1-2 tablets by mouth every 4 (four) hours as needed for  moderate pain. Patient not taking: Reported on 02/14/2017 12/05/16   Caren Griffins, MD  lisinopril (ZESTRIL) 2.5 MG tablet Take 1 tablet (2.5 mg total) by mouth daily. Patient  not taking: Reported on 02/14/2017 09/22/16 09/22/17  Florencia Reasons, MD   Allergies  Allergen Reactions  . Heparin Anaphylaxis  . Codeine Other (See Comments)    Unknown   . Tape     Plastic tape causes skin tears and blisters   Review of Systems Sleeping but arouses.  Denies any complaints  Physical Exam  General: Sleeping, arouses but falls back asleep, in no acute distress.  HEENT: No bruits, no goiter, no JVD Heart: Regular rate and rhythm. No murmur appreciated. Lungs: Good air movement, clear Abdomen: Soft, nontender, nondistended, positive bowel sounds.  Skin: Blisters cover lower extremity bilaterally  Vital Signs: BP (!) 125/55 (BP Location: Left Arm)   Pulse 93   Temp 97.6 F (36.4 C) (Oral)   Resp (!) 22   Ht 5' 4"  (1.626 m)   Wt 64.6 kg (142 lb 6.4 oz)   SpO2 95%   BMI 24.44 kg/m  Pain Assessment: No/denies pain   Pain Score: 0-No pain   SpO2: SpO2: 95 % O2 Device:SpO2: 95 % O2 Flow Rate: .   IO: Intake/output summary:  Intake/Output Summary (Last 24 hours) at 02/18/17 1226 Last data filed at 02/18/17 0248  Gross per 24 hour  Intake              100 ml  Output                0 ml  Net              100 ml    LBM: Last BM Date: 02/17/17 Baseline Weight: Weight: 56.7 kg (125 lb) Most recent weight: Weight: 64.6 kg (142 lb 6.4 oz)     Palliative Assessment/Data:   Flowsheet Rows     Most Recent Value  Intake Tab  Referral Department  Hospitalist  Unit at Time of Referral  Med/Surg Unit  Palliative Care Primary Diagnosis  Sepsis/Infectious Disease  Date Notified  02/17/17  Palliative Care Type  New Palliative care  Reason for referral  Clarify Goals of Care  Date of Admission  02/14/17  Date first seen by Palliative Care  02/18/17  # of days Palliative referral response time  1 Day(s)  # of days IP prior to Palliative referral  3  Clinical Assessment  Palliative Performance Scale Score  50%  Pain Max last 24 hours  Not able to report  Pain Min  Last 24 hours  Not able to report  Psychosocial & Spiritual Assessment  Palliative Care Outcomes  Patient/Family meeting held?  Yes  Who was at the meeting?  Patient's daughters  Palliative Care Outcomes  Improved pain interventions, Clarified goals of care, Counseled regarding hospice      Time In: 1005 Time Out: 1125 Time Total: 80 Greater than 50%  of this time was spent counseling and coordinating care related to the above assessment and plan.  Signed by: Micheline Rough, MD   Please contact Palliative Medicine Team phone at [phone] for questions and concerns.  For individual provider: See Amion   13:05-return to Ms. Jayne's room and she was awake at this time. Daughter Daleen Snook was at bedside.  She denies any complaints at this time including denying pain, however, she will moan at times throughout the encounter.  We reviewed that her daughters stated the  most important things to her are being comfortable and working toward getting back to her home.  When I asked her her thoughts on next steps from here she stated that she was, "going to see God, my husband, and my parents."  I asked if she meant that she thought she was going to die and she stated yes.  She states this is not scary to her.  We reviewed again that plan is to continue to help her feel as well as possible while seeing how she does with continued treatment over the next couple of days with the ultimate goal of returning to her own home.  She states that this is what we should be working toward.  Micheline Rough, MD [facility] Team [phone]

## 2017-02-18 NOTE — Progress Notes (Addendum)
Patient ID: Amanda Richardson, female   DOB: 1923/11/27, 83 y.o.   MRN: [id number]    PROGRESS NOTE  Amanda Richardson  WUJ:[id number] DOB: 1924/02/08 DOA: 02/14/2017  PCP: Merrilee Seashore, MD   Brief Narrative:  83 y.o. female with chronic diastolic CHF, chronic atrial fibrillation on Coumadin, bullous pemphigoid, presenting to emergency department with main concern of several days duration of progressively worsening lower extremity erythema and edema, tenderness to touch. Please note that patient provides very little history and daughter at bedside was able to assist with more details. Daughter explained that patient gets home health nurse that used to come twice a week and perform wound care on lower extremities and initially patient has been doing well. As patient's lower extremity wounds improved, nurse was coming to the house once a week and daughter reports noticing worsening erythema and swelling, some blisters got worse and ruptured. Patient has history of cellulitis in lower extremities and has been hospitalized for the problem before. Daughter reports subjective fevers and chills, occasional confusion and poor oral intake. Patient denies chest pain, no shortness of breath, no specific abdominal or urinary concerns.  Assessment & Plan:  Active Problems: Sepsis secondary to bilateral lower extremity cellulitis - Please note that patient met criteria for sepsis - Sepsis order set in place - cont Zosyn day #5 - WBC trending down, still with intermittent fevers, clinically not improving much - pt wants to be left alone, PCT consulted, cont current care and also allowing comfort  - if no progress, may shift to full comfort in next 24-48 hours - this was discussed with pt's family at bedside  - repeat lactic acid and procalcitonin in AM   Atrial fibrillation with RVR - Patient on Coreg at home as well as Coumadin - Coreg has been held due to low blood pressure - cont Coumadin,  continue metoprolol as needed  - OK to discontinue telemetry to allow more comfort   Acute kidney injury imposed on CKD stage III - Baseline creatinine about 1.2 - Creatinine up on this admission likely prerenal in etiology - lasix and lisinopril still on hold - Cr is slightly better this AM - BMP in AM  Bullous pemphigoid - has been following with dermatologist but seems like has been getting worse recently   Hypokalemia - supplemented and WNL this AM - BMP in AM  Thrombocytopenia - suspect reactive, improving  - will repeat CBC In AM   Physical deconditioning - appreciate PCT assistance - DNR code status  - if no improvement in next 24 - 48 hours, likely focus more on comfort   DVT prophylaxis: Coumadin  Code Status: DNR Family Communication: family at bedside  Disposition Plan: to be determined   Consultants:   PCT  Procedures:   None  Antimicrobials:   Vancomycin 10/24 --> 10/26  Zosyn 10/24 -->  Subjective: Pt asking not to be touched due to pain.   Objective: Vitals:   02/17/17 2253 02/18/17 0317 02/18/17 0402 02/18/17 1356  BP:   (!) 125/55 (!) 114/55  Pulse:   93 (!) 50  Resp:   (!) 22   Temp: (!) 100.8 F (38.2 C) 98.7 F (37.1 C) 97.6 F (36.4 C) 98 F (36.7 C)  TempSrc: Oral Oral Oral Oral  SpO2:   95% 99%  Weight:   64.6 kg (142 lb 6.4 oz)   Height:        Intake/Output Summary (Last 24 hours) at 02/18/17 1449 Last data filed  at 02/18/17 0930  Gross per 24 hour  Intake              160 ml  Output                0 ml  Net              160 ml   Filed Weights   02/16/17 0500 02/17/17 0500 02/18/17 0402  Weight: 57 kg (125 lb 10.6 oz) 58.5 kg (128 lb 15.5 oz) 64.6 kg (142 lb 6.4 oz)    Physical Exam  Constitutional: Appears calm, NAD Asked to defer exam today, will respect wishes   Data Reviewed: I have personally reviewed following labs and imaging studies  CBC:  Recent Labs Lab 02/14/17 1555 02/15/17 0558  02/16/17 0422 02/17/17 0455 02/18/17 0449  WBC 17.1* 23.5* 26.0* 25.6* 22.2*  NEUTROABS 15.5*  --   --   --   --   HGB 13.5 11.9* 12.5 12.0 11.8*  HCT 39.5 34.2* 36.5 34.5* 34.8*  MCV 93.4 92.7 93.1 92.7 93.5  PLT 118* 97* 104* 95* 638*   Basic Metabolic Panel:  Recent Labs Lab 02/14/17 1555 02/15/17 0558 02/16/17 0422 02/17/17 0455 02/18/17 0449  NA 138 139 142 142 141  K 3.4* 3.0* 3.3* 4.0 3.7  CL 101 108 111 112* 111  CO2 23 21* 20* 21* 22  GLUCOSE 100* 95 115* 126* 122*  BUN 34* 36* 49* 58* 56*  CREATININE 1.48* 1.55* 1.81* 1.67* 1.44*  CALCIUM 8.7* 7.8* 8.1* 8.4* 8.3*  MG  --   --   --  2.1  --    Coagulation Profile:  Recent Labs Lab 02/14/17 2129 02/15/17 0558 02/16/17 0422 02/17/17 0455 02/18/17 0449  INR 2.66 2.67 3.83 4.62* 3.73   Urine analysis:    Component Value Date/Time   COLORURINE YELLOW 09/21/2016 0638   APPEARANCEUR CLEAR 09/21/2016 0638   LABSPEC 1.016 09/21/2016 0638   PHURINE 5.0 09/21/2016 0638   GLUCOSEU NEGATIVE 09/21/2016 0638   HGBUR MODERATE (A) 09/21/2016 0638   BILIRUBINUR NEGATIVE 09/21/2016 [place] 09/21/2016 0638   PROTEINUR 100 (A) 09/21/2016 0638   UROBILINOGEN 4.0 (H) 08/04/2014 1608   NITRITE NEGATIVE 09/21/2016 0638   LEUKOCYTESUR NEGATIVE 09/21/2016 7564   Radiology Studies: Ct Abdomen Pelvis Wo Contrast  Result Date: 02/17/2017 CLINICAL DATA:  Sepsis secondary to bilateral lower extremity cellulitis, stage three renal failure/ sob, weakness EXAM: CT CHEST, ABDOMEN AND PELVIS WITHOUT CONTRAST TECHNIQUE: Multidetector CT imaging of the chest, abdomen and pelvis was performed following the standard protocol without IV contrast. COMPARISON:  09/20/2016 chest x-ray FINDINGS: CT CHEST FINDINGS Cardiovascular: The heart is markedly enlarged. There is massive enlargement of the left atrium. Moderate enlargement of the right atrium. Coronary artery calcifications are present. There is mitral annulus repair.  Status post median sternotomy. There is atherosclerotic calcification of the thoracic aorta not associated with aneurysm. Mediastinum/Nodes: The visualized portion of the thyroid gland has a normal appearance. No mediastinal, hilar, or axillary adenopathy. Lungs/Pleura: There are small bilateral pleural effusions. There is bibasilar atelectasis. Musculoskeletal: Marked kyphosis degenerative changes are present. CT ABDOMEN PELVIS FINDINGS Hepatobiliary: The liver is homogeneous. No focal mass identified. Gallbladder is present. Dependent calcifications are noted in the gallbladder. Pancreas: Unremarkable. No pancreatic ductal dilatation or surrounding inflammatory changes. Spleen: Normal in size without focal abnormality. Adrenals/Urinary Tract: Normal appearance of the adrenal glands. There is a small hyperdense lesion involving the upper pole the right kidney,  measuring less than 1 cm and stable in appearance, consistent with hemorrhagic cyst. There is chronic marked left hydronephrosis and loss of renal parenchyma. Urinary bladder is partially obscured but appears normal. Stomach/Bowel: The stomach and small bowel loops are normal in appearance. There is dilatation of the colonic loops, containing air-fluid levels and compatible with diarrhea. Vascular/Lymphatic: There is significant atherosclerotic calcification of the abdominal aorta. No aneurysm. Reproductive: The uterus is present. Portions of the pelvis are obscured by metallic artifact in the left hip. Other: Diffuse body wall edema. Musculoskeletal: Mild degenerative changes in the lumbar spine at L5-S1. No acute fracture. Status post median sternotomy. Left hip arthroplasty. IMPRESSION: 1. Marked enlargement of the left eighth item. Mitral annulus repair. 2.  Aortic atherosclerosis.  (ICD10-I70.0) 3. Coronary artery disease. 4. Small bilateral pleural effusions and bibasilar atelectasis. 5. Cholelithiasis. 6. Long-standing obstruction of the left kidney  with resulting loss of chronic hydronephrosis and loss of renal parenchyma. 7. Dilated, fluid-filled colon. 8. Diffuse body wall edema. 9. Status post left hip arthroplasty. 10. Exaggerated thoracic kyphosis and lumbar lordosis. Electronically Signed   By: Nolon Nations M.D.   On: 02/17/2017 16:09   Ct Chest Wo Contrast  Result Date: 02/17/2017 CLINICAL DATA:  Sepsis secondary to bilateral lower extremity cellulitis, stage three renal failure/ sob, weakness EXAM: CT CHEST, ABDOMEN AND PELVIS WITHOUT CONTRAST TECHNIQUE: Multidetector CT imaging of the chest, abdomen and pelvis was performed following the standard protocol without IV contrast. COMPARISON:  09/20/2016 chest x-ray FINDINGS: CT CHEST FINDINGS Cardiovascular: The heart is markedly enlarged. There is massive enlargement of the left atrium. Moderate enlargement of the right atrium. Coronary artery calcifications are present. There is mitral annulus repair. Status post median sternotomy. There is atherosclerotic calcification of the thoracic aorta not associated with aneurysm. Mediastinum/Nodes: The visualized portion of the thyroid gland has a normal appearance. No mediastinal, hilar, or axillary adenopathy. Lungs/Pleura: There are small bilateral pleural effusions. There is bibasilar atelectasis. Musculoskeletal: Marked kyphosis degenerative changes are present. CT ABDOMEN PELVIS FINDINGS Hepatobiliary: The liver is homogeneous. No focal mass identified. Gallbladder is present. Dependent calcifications are noted in the gallbladder. Pancreas: Unremarkable. No pancreatic ductal dilatation or surrounding inflammatory changes. Spleen: Normal in size without focal abnormality. Adrenals/Urinary Tract: Normal appearance of the adrenal glands. There is a small hyperdense lesion involving the upper pole the right kidney, measuring less than 1 cm and stable in appearance, consistent with hemorrhagic cyst. There is chronic marked left hydronephrosis and loss  of renal parenchyma. Urinary bladder is partially obscured but appears normal. Stomach/Bowel: The stomach and small bowel loops are normal in appearance. There is dilatation of the colonic loops, containing air-fluid levels and compatible with diarrhea. Vascular/Lymphatic: There is significant atherosclerotic calcification of the abdominal aorta. No aneurysm. Reproductive: The uterus is present. Portions of the pelvis are obscured by metallic artifact in the left hip. Other: Diffuse body wall edema. Musculoskeletal: Mild degenerative changes in the lumbar spine at L5-S1. No acute fracture. Status post median sternotomy. Left hip arthroplasty. IMPRESSION: 1. Marked enlargement of the left eighth item. Mitral annulus repair. 2.  Aortic atherosclerosis.  (ICD10-I70.0) 3. Coronary artery disease. 4. Small bilateral pleural effusions and bibasilar atelectasis. 5. Cholelithiasis. 6. Long-standing obstruction of the left kidney with resulting loss of chronic hydronephrosis and loss of renal parenchyma. 7. Dilated, fluid-filled colon. 8. Diffuse body wall edema. 9. Status post left hip arthroplasty. 10. Exaggerated thoracic kyphosis and lumbar lordosis. Electronically Signed   By: Nolon Nations M.D.  On: 02/17/2017 16:09    Scheduled Meds: . calcium-vitamin D  1 tablet Oral QPM  . cholecalciferol  2,000 Units Oral QPM  . feeding supplement (ENSURE ENLIVE)  237 mL Oral BID BM  . latanoprost  1 drop Both Eyes QHS  . levothyroxine  50 mcg Oral QAC breakfast  . multivitamin with minerals  1 tablet Oral QPM  . niacin  500 mg Oral BID WC  . potassium chloride SA  20 mEq Oral Daily  . timolol  1 drop Both Eyes Daily  . warfarin  0.5 mg Oral ONCE-1800  . Warfarin - Pharmacist Dosing Inpatient   Does not apply q1800   Continuous Infusions: . piperacillin-tazobactam (ZOSYN)  IV 3.375 g (02/18/17 1307)     LOS: 4 days   Time spent: 25 minutes   Faye Ramsay, MD [facility] Pager  [phone]  If 7PM-7AM, please contact night-coverage [email or web address] Password [email or web address] 02/18/2017, 2:49 PM

## 2017-02-18 NOTE — Progress Notes (Signed)
Patient has not urinated in 9 hrs. Bladder scanned patient and it showed 300cc. On call made aware. New orders given to in and out cath when bladder scan shows greater than 400 cc

## 2017-02-18 NOTE — Progress Notes (Signed)
[place] for Warfarin and vanc/zosyn for cellulitis Indication: mechanical MVR; atrial fib, h/o DVT  Allergies  Allergen Reactions  . Heparin Anaphylaxis  . Codeine Other (See Comments)    Unknown   . Tape     Plastic tape causes skin tears and blisters   Patient Measurements: Height: 5\' 4"  (162.6 cm) Weight: 142 lb 6.4 oz (64.6 kg) IBW/kg (Calculated) : 54.7 TBW: 56.7 kg  Vital Signs: Temp: 97.6 F (36.4 C) (10/28 0402) Temp Source: Oral (10/28 0402) BP: 125/55 (10/28 0402) Pulse Rate: 93 (10/28 0402)  Labs:  Recent Labs  02/16/17 0422 02/17/17 0455 02/18/17 0449  HGB 12.5 12.0 11.8*  HCT 36.5 34.5* 34.8*  PLT 104* 95* 118*  LABPROT 37.4* 43.3* 36.6*  INR 3.83 4.62* 3.73  CREATININE 1.81* 1.67* 1.44*   Estimated Creatinine Clearance: 22 mL/min (A) (by C-G formula based on SCr of 1.44 mg/dL (H)).  Assessment: 83 y/o F with mechanical mitral valve, afib, hx DVT - home warfarin dosage reportedly 1.5 mg Tu,Th,Su; 3mg  other days - family member noted patient took 3mg  on 10/23 instead of 1.5mg   Today,02/18/2017 INR supratherapeutic but improved after no warfarin x 2 days Dysphagia 3 diet; recently charted that pt has been consuming 20 to 25% of meals.  Lower dietary intake can contribute to increased INR on warfarin DDI: broad-spectrum antibiotic (Zosyn) may increase INR on warfarin Hgb relatively stable Pltc low but improved No bleeding reported in chart note   Goal of Therapy:  INR goal 2.5-3.5    Plan:   Resume warfarin with small dose today, 0.5 mg.    Daily Protime/INR while inpatient  Follow H/H, Pltc, any reports of bleeding  Clayburn Pert, PharmD, BCPS Pager: [phone] 02/18/2017  9:00 AM

## 2017-02-18 NOTE — Progress Notes (Signed)
Pt temp was 102. Paged on call for tylenol suppository. Reassessed temp and was 100.8.

## 2017-02-18 NOTE — Progress Notes (Signed)
PHARMACY NOTE:  ANTIMICROBIAL RENAL DOSAGE ADJUSTMENT  Current antimicrobial regimen includes a mismatch between antimicrobial dosage and estimated renal function.  As per policy approved by the Pharmacy & Therapeutics and Medical Executive Committees, the antimicrobial dosage will be adjusted accordingly.  Current antimicrobial dosage:  Zosyn 2.25 grams IV q8h  Indication: cellulitis  Renal Function:   Estimated Creatinine Clearance: 22 mL/min (A) (by C-G formula based on SCr of 1.44 mg/dL (H)). []      On intermittent HD, scheduled: []      On CRRT    Antimicrobial dosage has been changed to:  Zosyn 3.375 grams IV q8h (4-hour infusion) for CrCl > 20 mL/min   Clayburn Pert, PharmD, BCPS Pager: [phone] 02/18/2017  7:09 AM

## 2017-02-19 LAB — CBC
HEMATOCRIT: 34.8 % — AB (ref 36.0–46.0)
HEMOGLOBIN: 11.7 g/dL — AB (ref 12.0–15.0)
MCH: 31.6 pg (ref 26.0–34.0)
MCHC: 33.6 g/dL (ref 30.0–36.0)
MCV: 94.1 fL (ref 78.0–100.0)
Platelets: 143 10*3/uL — ABNORMAL LOW (ref 150–400)
RBC: 3.7 MIL/uL — ABNORMAL LOW (ref 3.87–5.11)
RDW: 16.3 % — AB (ref 11.5–15.5)
WBC: 15 10*3/uL — AB (ref 4.0–10.5)

## 2017-02-19 LAB — BASIC METABOLIC PANEL
ANION GAP: 8 (ref 5–15)
BUN: 58 mg/dL — ABNORMAL HIGH (ref 6–20)
CALCIUM: 8.5 mg/dL — AB (ref 8.9–10.3)
CHLORIDE: 113 mmol/L — AB (ref 101–111)
CO2: 21 mmol/L — AB (ref 22–32)
Creatinine, Ser: 1.44 mg/dL — ABNORMAL HIGH (ref 0.44–1.00)
GFR calc Af Amer: 36 mL/min — ABNORMAL LOW (ref >60)
GFR calc non Af Amer: 31 mL/min — ABNORMAL LOW (ref >60)
GLUCOSE: 108 mg/dL — AB (ref 65–99)
Potassium: 3.9 mmol/L (ref 3.5–5.1)
Sodium: 142 mmol/L (ref 135–145)

## 2017-02-19 LAB — PROCALCITONIN: Procalcitonin: 18.89 ng/mL

## 2017-02-19 LAB — PROTIME-INR
INR: 3.47
Prothrombin Time: 34.7 seconds — ABNORMAL HIGH (ref 11.4–15.2)

## 2017-02-19 MED ORDER — WARFARIN 0.5 MG HALF TABLET
0.5000 mg | ORAL_TABLET | Freq: Once | ORAL | Status: AC
  Administered 2017-02-19: 0.5 mg via ORAL
  Filled 2017-02-19: qty 1

## 2017-02-19 MED ORDER — OXYCODONE HCL 5 MG/5ML PO SOLN
7.5000 mg | Freq: Once | ORAL | Status: AC | PRN
  Administered 2017-02-19: 7.5 mg via ORAL
  Filled 2017-02-19: qty 10

## 2017-02-19 NOTE — Progress Notes (Signed)
[facility])  Received request from [place] for family interest in [facility] services after discharge. Chart reviewed and attempted visit x2. No family present either time. Spoke briefly with patient who was alert and deferred to her daughter. Spoke with daughter Daleen Snook by phone who confirms interest but undecided if plan should be home or in-patient. Explained what home hospice services include. She reports she and her sister have health problems and are unable to provide total care for patient. She requested follow up tomorrow, which we will do. Left brochure at bedside during second attempted visit. Updated RNCM Cookie re above.   Thank you,  Erling Conte, LCSW [phone]

## 2017-02-19 NOTE — Care Management Important Message (Signed)
Important Message  Patient Details  Name: SHAKENDRA GRIFFETH MRN: [id number] Date of Birth: [birth year]   Medicare Important Message Given:  Yes    Kerin Salen 02/19/2017, 11:17 AMImportant Message  Patient Details  Name: TRAMYA SCHOENFELDER MRN: [id number] Date of Birth: 1924/03/19   Medicare Important Message Given:  Yes    Kerin Salen 02/19/2017, 11:17 AM

## 2017-02-19 NOTE — Progress Notes (Signed)
Physical Therapy Treatment Patient Details Name: Amanda Richardson MRN: [id number] DOB: [birth year] Today's Date: 02/19/2017    History of Present Illness 83 y.o. female with chronic diastolic CHF, chronic atrial fibrillation on Coumadin, bullous pemphigoid, CAD, CABG, CVA, ischemic bowel disease and admitted for sepsis due to bilateral LE cellulitis    PT Comments    Pt with limited participation d/t pain--denies pain but then cries with movement of extremities, LEs more so than UEs; pt agrees to AAROM/PROM as tolerated, UEs repositioned and pt reported being comfortable afterward;  continue to recommend SNF, If d/c's home will need 24hr assist and hospital bed  Follow Up Recommendations  SNF;Supervision/Assistance - 24 hour     Equipment Recommendations  Hospital bed    Recommendations for Other Services       Precautions / Restrictions Precautions Precautions: Fall Precaution Comments: incontinent Restrictions Weight Bearing Restrictions: No    Mobility  Bed Mobility Overal bed mobility: Needs Assistance             General bed mobility comments: attempted EOB, pt unable to tolerate d/t pain with movement  Transfers                 General transfer comment: Pt unable to tolerate  Ambulation/Gait                 Stairs            Wheelchair Mobility    Modified Rankin (Stroke Patients Only)       Balance                                            Cognition Arousal/Alertness: Awake/alert (falls asleep easily, arouses to voice) Behavior During Therapy: Flat affect Overall Cognitive Status: No family/caregiver present to determine baseline cognitive functioning                         Following Commands: Follows one step commands inconsistently;Follows one step commands with increased time     Problem Solving: Slow processing;Decreased initiation;Difficulty sequencing;Requires verbal cues;Requires  tactile cues General Comments: pt verbalizes very little, mostly cries out or moans  as if in pain      Exercises General Exercises - Upper Extremity Shoulder Flexion: PROM;5 reps;Supine Shoulder ABduction: PROM;Both;5 reps;Supine Shoulder ADduction: PROM;5 reps;Both Shoulder Horizontal ABduction: PROM;Both;5 reps Shoulder Horizontal ADduction: PROM;Both;5 reps Elbow Flexion: PROM;AAROM;Both;5 reps Elbow Extension: PROM;Both;5 reps;Supine Digit Composite Flexion: AROM;Both;5 reps;Supine Composite Extension: AROM;Both;5 reps;Supine Other Exercises Other Exercises: attempted LE ROM--pt too painful    General Comments        Pertinent Vitals/Pain Pain Assessment: Faces Faces Pain Scale: Hurts whole lot Pain Location: pt denies pain at rest or when asked--cries out with any movement Pain Descriptors / Indicators: Moaning;Tender;Grimacing;Guarding Pain Intervention(s): Monitored during session;Repositioned    Home Living                      Prior Function            PT Goals (current goals can now be found in the care plan section) Acute Rehab PT Goals Patient Stated Goal: Pt did not state.  Family hopes she will progress, but also want her comfortable  PT Goal Formulation: With patient Time For Goal Achievement: 03/02/17 Potential to Achieve Goals: Fair Progress towards PT goals: Not  progressing toward goals - comment (pain with any & all movement)    Frequency    Min 2X/week      PT Plan Current plan remains appropriate;Frequency needs to be updated    Co-evaluation              AM-PAC PT "6 Clicks" Daily Activity  Outcome Measure  Difficulty turning over in bed (including adjusting bedclothes, sheets and blankets)?: Unable Difficulty moving from lying on back to sitting on the side of the bed? : Unable Difficulty sitting down on and standing up from a chair with arms (e.g., wheelchair, bedside commode, etc,.)?: Unable Help needed moving to  and from a bed to chair (including a wheelchair)?: Total Help needed walking in hospital room?: Total Help needed climbing 3-5 steps with a railing? : Total 6 Click Score: 6    End of Session   Activity Tolerance: Patient limited by pain Patient left: in bed;with call bell/phone within reach;with bed alarm set   PT Visit Diagnosis: Muscle weakness (generalized) (M62.81)     Time: 9450-3888 PT Time Calculation (min) (ACUTE ONLY): 11 min  Charges:  $Therapeutic Exercise: 8-22 mins                    G CodesKenyon Ana, PT Pager: [phone] 02/19/2017    Kenyon Ana 02/19/2017, 11:08 AM

## 2017-02-19 NOTE — Progress Notes (Signed)
Mepitel received.  Dressings have been cleansed and changed per order. Will continue to monitor.

## 2017-02-19 NOTE — Progress Notes (Signed)
Palliative care progress note Reason for consult: Goals of care in light of recurrent infections from bullous pemphigoid  I met with Ms. Disbro today.  No family present at bedside at time of encounter.  She was sleeping on my arrival but aroused easily.  She denied any needs today and reports she is unsure if she gets any benefit from pain medication that is ordered.  She stated that she is tired and then fell back asleep.  Yesterday, family agreed with plan to continue current therapies for 48 hours.  They were also in agreement with meeting with hospice as most likely disposition will either be home with hospice versus residential hospice based on her clinical course.  While ongoing of service, I will ask Dr. Rowe Pavy or another member palliative medicine team to follow-up with patient and her family tomorrow.  Hospice also noted to be planning to meet with family tomorrow as well.  Total time: 15 minutes Greater than 50%  of this time was spent counseling and coordinating care related to the above assessment and plan.  Micheline Rough, MD [facility] [phone]

## 2017-02-19 NOTE — Progress Notes (Signed)
Referral for [facility] given to in house rep.

## 2017-02-19 NOTE — Progress Notes (Signed)
Hospital out of mepitel dressing.  Materials notified and stated they would have to order more; it would be here today but they are unsure of the time.  Only one dressing changed (L hip dressing) due to limited supplies.  Patient has been cleaned and linens changed.  Maudie Flakes, WOC RN, paged to request if there is an alternative dressing that can be placed on wounds.  Patient was uncomfortable during dressing change, but pain medication was administered per orders and patient is currently resting comfortably in bed.  Will continue to monitor.

## 2017-02-19 NOTE — Progress Notes (Signed)
[place] for Warfarin and vanc/zosyn for cellulitis Indication: mechanical MVR; atrial fib, h/o DVT  Allergies  Allergen Reactions  . Heparin Anaphylaxis  . Codeine Other (See Comments)    Unknown   . Tape     Plastic tape causes skin tears and blisters   Patient Measurements: Height: 5\' 4"  (162.6 cm) Weight: 135 lb 6.4 oz (61.4 kg) IBW/kg (Calculated) : 54.7 TBW: 56.7 kg  Vital Signs: Temp: 97.7 F (36.5 C) (10/29 0551) Temp Source: Oral (10/29 0551) BP: 143/60 (10/29 0551) Pulse Rate: 97 (10/29 0551)  Labs:  Recent Labs  02/17/17 0455 02/18/17 0449 02/19/17 0448  HGB 12.0 11.8* 11.7*  HCT 34.5* 34.8* 34.8*  PLT 95* 118* 143*  LABPROT 43.3* 36.6* 34.7*  INR 4.62* 3.73 3.47  CREATININE 1.67* 1.44* 1.44*   Estimated Creatinine Clearance: 22 mL/min (A) (by C-G formula based on SCr of 1.44 mg/dL (H)).  Assessment: 83 y/o F with mechanical mitral valve, afib, hx DVT - home warfarin dosage reportedly 1.5 mg Tu,Th,Su; 3mg  other days - family member noted patient took 3mg  on 10/23 instead of 1.5mg   Today,02/19/2017 INR in therapeutic range this am, held x 2 prior to 0.5mg  yesterday Dysphagia 3 diet; recently charted that pt has been consuming 20 to 25% of meals.  Lower dietary intake can contribute to increased INR on warfarin DDI: broad-spectrum antibiotic (Zosyn) may increase INR on warfarin Hgb relatively stable Pltc low but improved No bleeding reported in chart note  Goal of Therapy:  INR goal 2.5-3.5    Plan:   Continue Warfarin 0.5 mg today  Daily Protime/INR while inpatient  Follow H/H, Pltc, any reports of bleeding  Minda Ditto PharmD Pager [phone] 02/19/2017, 11:46 AM

## 2017-02-19 NOTE — Progress Notes (Signed)
Patient ID: Amanda Richardson, female   DOB: 12-08-1923, 83 y.o.   MRN: [id number]    PROGRESS NOTE  Darria Corvera Baller  QAE:[id number] DOB: March 18, 1924 DOA: 02/14/2017  PCP: Merrilee Seashore, MD   Brief Narrative:  83 y.o. female with chronic diastolic CHF, chronic atrial fibrillation on Coumadin, bullous pemphigoid, presenting to emergency department with main concern of several days duration of progressively worsening lower extremity erythema and edema, tenderness to touch. Please note that patient provides very little history and daughter at bedside was able to assist with more details. Daughter explained that patient gets home health nurse that used to come twice a week and perform wound care on lower extremities and initially patient has been doing well. As patient's lower extremity wounds improved, nurse was coming to the house once a week and daughter reports noticing worsening erythema and swelling, some blisters got worse and ruptured. Patient has history of cellulitis in lower extremities and has been hospitalized for the problem before. Daughter reports subjective fevers and chills, occasional confusion and poor oral intake. Patient denies chest pain, no shortness of breath, no specific abdominal or urinary concerns.  Assessment & Plan:  Active Problems: Sepsis secondary to bilateral lower extremity cellulitis - Please note that patient met criteria for sepsis - Sepsis order set in place - cont Zosyn day #6/7 - WBC overall trending down, no further escalation of care  - reevaluate in 24 hours, allowing comfort   Atrial fibrillation with RVR - Patient on Coreg at home as well as Coumadin - Coreg has been held due to low blood pressure - cont Coumadin, continue metoprolol as needed   Acute kidney injury imposed on CKD stage III - Baseline creatinine about 1.2 - Creatinine up on this admission likely prerenal in etiology - lasix and lisinopril still on hold - Cr overall stable ~  1.4 - BMP in AM  Bullous pemphigoid - has been following with dermatologist but seems like has been getting worse recently   Hypokalemia - supplemented and WNL this AM   Thrombocytopenia - suspect reactive - overall stable    Physical deconditioning - appreciate PCT assistance - DNR code status  - allowing comfort but also continue current medical measures   DVT prophylaxis: Coumadin  Code Status: DNR Family Communication: family at bedside  Disposition Plan: to be determined   Consultants:   PCT  Procedures:   None  Antimicrobials:   Vancomycin 10/24 --> 10/26  Zosyn 10/24 -->  Subjective: No events overnight.   Objective: Vitals:   02/18/17 0402 02/18/17 1356 02/18/17 2133 02/19/17 0551  BP: (!) 125/55 (!) 114/55 (!) 141/68 (!) 143/60  Pulse: 93 (!) 50 (!) 103 97  Resp: (!) 22  (!) 24 (!) 22  Temp: 97.6 F (36.4 C) 98 F (36.7 C) 99.1 F (37.3 C) 97.7 F (36.5 C)  TempSrc: Oral Oral Oral Oral  SpO2: 95% 99% 97% 97%  Weight: 64.6 kg (142 lb 6.4 oz)   61.4 kg (135 lb 6.4 oz)  Height:        Intake/Output Summary (Last 24 hours) at 02/19/17 0853 Last data filed at 02/19/17 0600  Gross per 24 hour  Intake              360 ml  Output                0 ml  Net              360 ml  Filed Weights   02/17/17 0500 02/18/17 0402 02/19/17 0551  Weight: 58.5 kg (128 lb 15.5 oz) 64.6 kg (142 lb 6.4 oz) 61.4 kg (135 lb 6.4 oz)    Physical Exam  Constitutional: Appears calm, resting, asked me not examine her this AM    Data Reviewed: I have personally reviewed following labs and imaging studies  CBC:  Recent Labs Lab 02/14/17 1555 02/15/17 0558 02/16/17 0422 02/17/17 0455 02/18/17 0449 02/19/17 0448  WBC 17.1* 23.5* 26.0* 25.6* 22.2* 15.0*  NEUTROABS 15.5*  --   --   --   --   --   HGB 13.5 11.9* 12.5 12.0 11.8* 11.7*  HCT 39.5 34.2* 36.5 34.5* 34.8* 34.8*  MCV 93.4 92.7 93.1 92.7 93.5 94.1  PLT 118* 97* 104* 95* 118* 143*   Basic  Metabolic Panel:  Recent Labs Lab 02/15/17 0558 02/16/17 0422 02/17/17 0455 02/18/17 0449 02/19/17 0448  NA 139 142 142 141 142  K 3.0* 3.3* 4.0 3.7 3.9  CL 108 111 112* 111 113*  CO2 21* 20* 21* 22 21*  GLUCOSE 95 115* 126* 122* 108*  BUN 36* 49* 58* 56* 58*  CREATININE 1.55* 1.81* 1.67* 1.44* 1.44*  CALCIUM 7.8* 8.1* 8.4* 8.3* 8.5*  MG  --   --  2.1  --   --    Coagulation Profile:  Recent Labs Lab 02/15/17 0558 02/16/17 0422 02/17/17 0455 02/18/17 0449 02/19/17 0448  INR 2.67 3.83 4.62* 3.73 3.47   Urine analysis:    Component Value Date/Time   COLORURINE YELLOW 09/21/2016 [place] 09/21/2016 0638   LABSPEC 1.016 09/21/2016 0638   PHURINE 5.0 09/21/2016 0638   GLUCOSEU NEGATIVE 09/21/2016 0638   HGBUR MODERATE (A) 09/21/2016 0638   BILIRUBINUR NEGATIVE 09/21/2016 [place] 09/21/2016 0638   PROTEINUR 100 (A) 09/21/2016 0638   UROBILINOGEN 4.0 (H) 08/04/2014 1608   NITRITE NEGATIVE 09/21/2016 0638   LEUKOCYTESUR NEGATIVE 09/21/2016 1438   Radiology Studies: Ct Abdomen Pelvis Wo Contrast  Result Date: 02/17/2017 CLINICAL DATA:  Sepsis secondary to bilateral lower extremity cellulitis, stage three renal failure/ sob, weakness EXAM: CT CHEST, ABDOMEN AND PELVIS WITHOUT CONTRAST TECHNIQUE: Multidetector CT imaging of the chest, abdomen and pelvis was performed following the standard protocol without IV contrast. COMPARISON:  09/20/2016 chest x-ray FINDINGS: CT CHEST FINDINGS Cardiovascular: The heart is markedly enlarged. There is massive enlargement of the left atrium. Moderate enlargement of the right atrium. Coronary artery calcifications are present. There is mitral annulus repair. Status post median sternotomy. There is atherosclerotic calcification of the thoracic aorta not associated with aneurysm. Mediastinum/Nodes: The visualized portion of the thyroid gland has a normal appearance. No mediastinal, hilar, or axillary  adenopathy. Lungs/Pleura: There are small bilateral pleural effusions. There is bibasilar atelectasis. Musculoskeletal: Marked kyphosis degenerative changes are present. CT ABDOMEN PELVIS FINDINGS Hepatobiliary: The liver is homogeneous. No focal mass identified. Gallbladder is present. Dependent calcifications are noted in the gallbladder. Pancreas: Unremarkable. No pancreatic ductal dilatation or surrounding inflammatory changes. Spleen: Normal in size without focal abnormality. Adrenals/Urinary Tract: Normal appearance of the adrenal glands. There is a small hyperdense lesion involving the upper pole the right kidney, measuring less than 1 cm and stable in appearance, consistent with hemorrhagic cyst. There is chronic marked left hydronephrosis and loss of renal parenchyma. Urinary bladder is partially obscured but appears normal. Stomach/Bowel: The stomach and small bowel loops are normal in appearance. There is dilatation of the colonic loops, containing air-fluid levels  and compatible with diarrhea. Vascular/Lymphatic: There is significant atherosclerotic calcification of the abdominal aorta. No aneurysm. Reproductive: The uterus is present. Portions of the pelvis are obscured by metallic artifact in the left hip. Other: Diffuse body wall edema. Musculoskeletal: Mild degenerative changes in the lumbar spine at L5-S1. No acute fracture. Status post median sternotomy. Left hip arthroplasty. IMPRESSION: 1. Marked enlargement of the left eighth item. Mitral annulus repair. 2.  Aortic atherosclerosis.  (ICD10-I70.0) 3. Coronary artery disease. 4. Small bilateral pleural effusions and bibasilar atelectasis. 5. Cholelithiasis. 6. Long-standing obstruction of the left kidney with resulting loss of chronic hydronephrosis and loss of renal parenchyma. 7. Dilated, fluid-filled colon. 8. Diffuse body wall edema. 9. Status post left hip arthroplasty. 10. Exaggerated thoracic kyphosis and lumbar lordosis. Electronically  Signed   By: Nolon Nations M.D.   On: 02/17/2017 16:09   Ct Chest Wo Contrast  Result Date: 02/17/2017 CLINICAL DATA:  Sepsis secondary to bilateral lower extremity cellulitis, stage three renal failure/ sob, weakness EXAM: CT CHEST, ABDOMEN AND PELVIS WITHOUT CONTRAST TECHNIQUE: Multidetector CT imaging of the chest, abdomen and pelvis was performed following the standard protocol without IV contrast. COMPARISON:  09/20/2016 chest x-ray FINDINGS: CT CHEST FINDINGS Cardiovascular: The heart is markedly enlarged. There is massive enlargement of the left atrium. Moderate enlargement of the right atrium. Coronary artery calcifications are present. There is mitral annulus repair. Status post median sternotomy. There is atherosclerotic calcification of the thoracic aorta not associated with aneurysm. Mediastinum/Nodes: The visualized portion of the thyroid gland has a normal appearance. No mediastinal, hilar, or axillary adenopathy. Lungs/Pleura: There are small bilateral pleural effusions. There is bibasilar atelectasis. Musculoskeletal: Marked kyphosis degenerative changes are present. CT ABDOMEN PELVIS FINDINGS Hepatobiliary: The liver is homogeneous. No focal mass identified. Gallbladder is present. Dependent calcifications are noted in the gallbladder. Pancreas: Unremarkable. No pancreatic ductal dilatation or surrounding inflammatory changes. Spleen: Normal in size without focal abnormality. Adrenals/Urinary Tract: Normal appearance of the adrenal glands. There is a small hyperdense lesion involving the upper pole the right kidney, measuring less than 1 cm and stable in appearance, consistent with hemorrhagic cyst. There is chronic marked left hydronephrosis and loss of renal parenchyma. Urinary bladder is partially obscured but appears normal. Stomach/Bowel: The stomach and small bowel loops are normal in appearance. There is dilatation of the colonic loops, containing air-fluid levels and compatible with  diarrhea. Vascular/Lymphatic: There is significant atherosclerotic calcification of the abdominal aorta. No aneurysm. Reproductive: The uterus is present. Portions of the pelvis are obscured by metallic artifact in the left hip. Other: Diffuse body wall edema. Musculoskeletal: Mild degenerative changes in the lumbar spine at L5-S1. No acute fracture. Status post median sternotomy. Left hip arthroplasty. IMPRESSION: 1. Marked enlargement of the left eighth item. Mitral annulus repair. 2.  Aortic atherosclerosis.  (ICD10-I70.0) 3. Coronary artery disease. 4. Small bilateral pleural effusions and bibasilar atelectasis. 5. Cholelithiasis. 6. Long-standing obstruction of the left kidney with resulting loss of chronic hydronephrosis and loss of renal parenchyma. 7. Dilated, fluid-filled colon. 8. Diffuse body wall edema. 9. Status post left hip arthroplasty. 10. Exaggerated thoracic kyphosis and lumbar lordosis. Electronically Signed   By: Nolon Nations M.D.   On: 02/17/2017 16:09    Scheduled Meds: . calcium-vitamin D  1 tablet Oral QPM  . cholecalciferol  2,000 Units Oral QPM  . feeding supplement (ENSURE ENLIVE)  237 mL Oral BID BM  . latanoprost  1 drop Both Eyes QHS  . levothyroxine  50 mcg Oral QAC  breakfast  . multivitamin with minerals  1 tablet Oral QPM  . niacin  500 mg Oral BID WC  . potassium chloride SA  20 mEq Oral Daily  . timolol  1 drop Both Eyes Daily  . Warfarin - Pharmacist Dosing Inpatient   Does not apply q1800   Continuous Infusions: . piperacillin-tazobactam (ZOSYN)  IV 3.375 g (02/19/17 0214)     LOS: 5 days   Time spent: 25 minutes   Faye Ramsay, MD [facility] Pager [phone]  If 7PM-7AM, please contact night-coverage [email or web address] Password [email or web address] 02/19/2017, 8:53 AM

## 2017-02-20 LAB — BASIC METABOLIC PANEL
Anion gap: 9 (ref 5–15)
BUN: 46 mg/dL — AB (ref 6–20)
CALCIUM: 8.5 mg/dL — AB (ref 8.9–10.3)
CO2: 21 mmol/L — AB (ref 22–32)
Chloride: 111 mmol/L (ref 101–111)
Creatinine, Ser: 1.28 mg/dL — ABNORMAL HIGH (ref 0.44–1.00)
GFR calc Af Amer: 41 mL/min — ABNORMAL LOW (ref >60)
GFR calc non Af Amer: 35 mL/min — ABNORMAL LOW (ref >60)
GLUCOSE: 113 mg/dL — AB (ref 65–99)
Potassium: 4.2 mmol/L (ref 3.5–5.1)
Sodium: 141 mmol/L (ref 135–145)

## 2017-02-20 LAB — CBC
HEMATOCRIT: 35.8 % — AB (ref 36.0–46.0)
Hemoglobin: 12.2 g/dL (ref 12.0–15.0)
MCH: 32.4 pg (ref 26.0–34.0)
MCHC: 34.1 g/dL (ref 30.0–36.0)
MCV: 95.2 fL (ref 78.0–100.0)
Platelets: 167 10*3/uL (ref 150–400)
RBC: 3.76 MIL/uL — ABNORMAL LOW (ref 3.87–5.11)
RDW: 16.3 % — AB (ref 11.5–15.5)
WBC: 11.2 10*3/uL — ABNORMAL HIGH (ref 4.0–10.5)

## 2017-02-20 LAB — CULTURE, BLOOD (ROUTINE X 2)
CULTURE: NO GROWTH
CULTURE: NO GROWTH
SPECIAL REQUESTS: ADEQUATE
Special Requests: ADEQUATE

## 2017-02-20 LAB — PROTIME-INR
INR: 3.45
PROTHROMBIN TIME: 34.4 s — AB (ref 11.4–15.2)

## 2017-02-20 LAB — PROCALCITONIN: Procalcitonin: 8.65 ng/mL

## 2017-02-20 MED ORDER — WARFARIN 0.5 MG HALF TABLET
0.5000 mg | ORAL_TABLET | Freq: Once | ORAL | Status: AC
  Filled 2017-02-20: qty 1

## 2017-02-20 NOTE — Progress Notes (Signed)
Palliative care progress note Reason for consult: Goals of care in light of recurrent infections from bullous pemphigoid  I met with Amanda Richardson and her daughter today.     Patient is awake, appears weak and is not eating much. She stated that her pain was well controlled at present, she has 2 daughters, her other daughter was d/c from the hospital on 02-19-17.    She does appear weak and tired, she eventually fell back asleep. BP (!) 114/54 (BP Location: Left Arm)   Pulse 85   Temp 98 F (36.7 C) (Oral)   Resp 18   Ht 5' 4"  (1.626 m)   Wt 61.2 kg (134 lb 14.7 oz)   SpO2 97%   BMI 23.16 kg/m  Labs and imaging noted.   Awake, but appears weak, falls back asleep easily.  Regular Wounds bilateral LE  Abdomen soft Frail, with muscle wasting evident    Disposition issues addressed in detail, daughter is conflicted about best possible d.c options for her mother. She understands that the patient is at high risk for ongoing recurrent infections and possible sepsis episodes.   We reviewed hospice philosophy of care, I appreciate Erling Conte LCSW assistance, who is now at the bedside discussing with patient's daughter.   Patient's daughter is also requesting information from case management about SNF rehab with palliative, she is also interested in finding out about a list of 24/7 caregivers in order to consider home with hospice discharge.   There is no d/c plan yet. I gave the daughter my card, encouraged her to continue conversations with case management as well as with hospice staff.    Total time: 25 minutes Greater than 50%  of this time was spent counseling and coordinating care related to the above assessment and plan.  Loistine Chance, MD [place] [facility] [phone]

## 2017-02-20 NOTE — Progress Notes (Signed)
Patient ID: Amanda Richardson, female   DOB: 06/16/1923, 83 y.o.   MRN: [id number]    PROGRESS NOTE  Amanda Richardson  KDX:[id number] DOB: Mar 25, 1924 DOA: 02/14/2017  PCP: Amanda Seashore, MD   Brief Narrative:  83 y.o. female with chronic diastolic CHF, chronic atrial fibrillation on Coumadin, bullous pemphigoid, presenting to emergency department with main concern of several days duration of progressively worsening lower extremity erythema and edema, tenderness to touch. Please note that patient provides very little history and daughter at bedside was able to assist with more details. Daughter explained that patient gets home health nurse that used to come twice a week and perform wound care on lower extremities and initially patient has been doing well. As patient's lower extremity wounds improved, nurse was coming to the house once a week and daughter reports noticing worsening erythema and swelling, some blisters got worse and ruptured. Patient has history of cellulitis in lower extremities and has been hospitalized for the problem before. Daughter reports subjective fevers and chills, occasional confusion and poor oral intake. Patient denies chest pain, no shortness of breath, no specific abdominal or urinary concerns.  Assessment & Plan:  Active Problems: Sepsis secondary to bilateral lower extremity cellulitis - Please note that patient met criteria for sepsis - Sepsis order set in place - cont Zosyn day #7, will need to re evaluate in AM as pt still with some erythema and significant TTP  - WBC overall trending down, no further escalation of care  - reevaluate in AM and possible d/c of ABX in AM - WBC in AM   Atrial fibrillation with RVR - Patient on Coreg at home as well as Coumadin - Coreg has been held due to low blood pressure - cont Coumadin - HR has been in 50-100's, if remains elevated > 90, consider placing back on Coreg home regimen   Acute kidney injury imposed on  CKD stage III - Baseline creatinine about 1.2 - Creatinine up on this admission likely prerenal in etiology - lasix and lisinopril still on hold - still minimal urine output, pt has not has but < 10% of her breakfast this AM - Cr is trending down - will repeat BMP In AM - avoiding fluid as pt is significant pain and does not want to have more IV lines  Bullous pemphigoid - has been following with dermatologist but seems like has been getting worse recently  - still with significant pain, moaning with even slight touch - will discuss with PCT about analgesia - pt has had some hallucinations today and last night and per daughter thinks it is related to fentanyl - if we see signs of worsening mental status, may change fentanyl to other type of analgesia   Hypokalemia - has been supplemented   Thrombocytopenia - suspect reactive - overall stable    Physical deconditioning - appreciate PCT assistance - DNR code status  - to determine if residential hospice eligible   DVT prophylaxis: Coumadin  Code Status: DNR Family Communication: family at bedside  Disposition Plan: to be determined   Consultants:   PCT  Procedures:   None  Antimicrobials:   Vancomycin 10/24 --> 10/26  Zosyn 10/24 --> 10/31  Subjective: More pain this AM, hallucinating.   Objective: Vitals:   02/20/17 0500 02/20/17 0643 02/20/17 0902 02/20/17 1216  BP:  (!) 112/59 (!) 152/65 (!) 114/54  Pulse:  (!) 104 (!) 52 85  Resp:  18    Temp:  98.6 F (37  C) 97.6 F (36.4 C) 98 F (36.7 C)  TempSrc:  Oral Axillary Oral  SpO2:  96% 99% 97%  Weight: 61.2 kg (134 lb 14.7 oz)     Height:        Intake/Output Summary (Last 24 hours) at 02/20/17 1613 Last data filed at 02/20/17 0700  Gross per 24 hour  Intake              110 ml  Output              750 ml  Net             -640 ml   Filed Weights   02/18/17 0402 02/19/17 0551 02/20/17 0500  Weight: 64.6 kg (142 lb 6.4 oz) 61.4 kg (135 lb 6.4  oz) 61.2 kg (134 lb 14.7 oz)    Physical Exam  Constitutional: Appears confused, noted visual hallucinations, in significant pain and asking me not to touch her   Data Reviewed: I have personally reviewed following labs and imaging studies  CBC:  Recent Labs Lab 02/14/17 1555  02/16/17 0422 02/17/17 0455 02/18/17 0449 02/19/17 0448 02/20/17 0440  WBC 17.1*  < > 26.0* 25.6* 22.2* 15.0* 11.2*  NEUTROABS 15.5*  --   --   --   --   --   --   HGB 13.5  < > 12.5 12.0 11.8* 11.7* 12.2  HCT 39.5  < > 36.5 34.5* 34.8* 34.8* 35.8*  MCV 93.4  < > 93.1 92.7 93.5 94.1 95.2  PLT 118*  < > 104* 95* 118* 143* 167  < > = values in this interval not displayed. Basic Metabolic Panel:  Recent Labs Lab 02/16/17 0422 02/17/17 0455 02/18/17 0449 02/19/17 0448 02/20/17 0440  NA 142 142 141 142 141  K 3.3* 4.0 3.7 3.9 4.2  CL 111 112* 111 113* 111  CO2 20* 21* 22 21* 21*  GLUCOSE 115* 126* 122* 108* 113*  BUN 49* 58* 56* 58* 46*  CREATININE 1.81* 1.67* 1.44* 1.44* 1.28*  CALCIUM 8.1* 8.4* 8.3* 8.5* 8.5*  MG  --  2.1  --   --   --    Coagulation Profile:  Recent Labs Lab 02/16/17 0422 02/17/17 0455 02/18/17 0449 02/19/17 0448 02/20/17 0440  INR 3.83 4.62* 3.73 3.47 3.45   Urine analysis:    Component Value Date/Time   COLORURINE YELLOW 09/21/2016 0638   APPEARANCEUR CLEAR 09/21/2016 0638   LABSPEC 1.016 09/21/2016 0638   PHURINE 5.0 09/21/2016 0638   GLUCOSEU NEGATIVE 09/21/2016 0638   HGBUR MODERATE (A) 09/21/2016 0638   BILIRUBINUR NEGATIVE 09/21/2016 0638   KETONESUR NEGATIVE 09/21/2016 0638   PROTEINUR 100 (A) 09/21/2016 0638   UROBILINOGEN 4.0 (H) 08/04/2014 1608   NITRITE NEGATIVE 09/21/2016 [place] 09/21/2016 3734   Radiology Studies: No results found.  Scheduled Meds: . calcium-vitamin D  1 tablet Oral QPM  . cholecalciferol  2,000 Units Oral QPM  . feeding supplement (ENSURE ENLIVE)  237 mL Oral BID BM  . latanoprost  1 drop Both Eyes  QHS  . levothyroxine  50 mcg Oral QAC breakfast  . multivitamin with minerals  1 tablet Oral QPM  . niacin  500 mg Oral BID WC  . potassium chloride SA  20 mEq Oral Daily  . timolol  1 drop Both Eyes Daily  . warfarin  0.5 mg Oral ONCE-1800  . Warfarin - Pharmacist Dosing Inpatient   Does not apply q1800   Continuous  Infusions: . piperacillin-tazobactam (ZOSYN)  IV Stopped (02/20/17 1435)     LOS: 6 days   Time spent: 25 minutes   Faye Ramsay, MD [facility] Pager [phone]  If 7PM-7AM, please contact night-coverage [email or web address] Password [email or web address] 02/20/2017, 4:13 PM

## 2017-02-20 NOTE — Progress Notes (Signed)
[place] for Warfarin and vanc/zosyn for cellulitis Indication: mechanical MVR; atrial fib, h/o DVT  Allergies  Allergen Reactions  . Heparin Anaphylaxis  . Codeine Other (See Comments)    Unknown   . Tape     Plastic tape causes skin tears and blisters   Patient Measurements: Height: 5\' 4"  (162.6 cm) Weight: 134 lb 14.7 oz (61.2 kg) IBW/kg (Calculated) : 54.7 TBW: 56.7 kg  Vital Signs: Temp: 97.6 F (36.4 C) (10/30 0902) Temp Source: Axillary (10/30 0902) BP: 152/65 (10/30 0902) Pulse Rate: 52 (10/30 0902)  Labs:  Recent Labs  02/18/17 0449 02/19/17 0448 02/20/17 0440  HGB 11.8* 11.7* 12.2  HCT 34.8* 34.8* 35.8*  PLT 118* 143* 167  LABPROT 36.6* 34.7* 34.4*  INR 3.73 3.47 3.45  CREATININE 1.44* 1.44* 1.28*   Estimated Creatinine Clearance: 24.7 mL/min (A) (by C-G formula based on SCr of 1.28 mg/dL (H)).  Assessment: 83 y/o F with mechanical mitral valve, afib, hx DVT - home warfarin dosage reportedly 1.5 mg Tu,Th,Su; 3mg  other days - family member noted patient took 3mg  on 10/23 instead of 1.5mg   Today,02/20/2017 INR remains in therapeutic range this am,  Dysphagia 3 diet; recently charted that pt has been consuming 20 to 25% of meals.  Lower dietary intake can contribute to increased INR on warfarin DDI: broad-spectrum antibiotic (Zosyn) may increase INR on warfarin Hgb improved Pltc improved, now wnl No bleeding reported in chart note  Goal of Therapy:  INR goal 2.5-3.5   Plan:   Continue Warfarin 0.5 mg today  Daily Protime/INR while inpatient  Follow H/H, Pltc, any reports of bleeding  Minda Ditto PharmD Pager [phone] 02/20/2017, 10:32 AM

## 2017-02-20 NOTE — Progress Notes (Signed)
Spoke with pt's daughter Daleen Snook who states that she is going over to her sister's home to talk about disposition for pt. Explained to [place] again [facility], Private Duty, SNF and Hospice. Pt is active with [facility] at home. Daleen Snook ask that I leave Private Duty list with her friend at pt's bedside. Private duty list left in pt's room with friend.

## 2017-02-20 NOTE — Progress Notes (Signed)
SLP Cancellation Note  Patient Details Name: Amanda Richardson MRN: [id number] DOB: [birth year]   Cancelled treatment:       Reason Eval/Treat Not Completed: Other (comment) (RN reports pt refusing po medications, she did consume Ensure without difficulties and is to possibly dc today with hospice)   Claudie Fisherman, [place] [facility] SLP [phone]

## 2017-02-21 DIAGNOSIS — Z515 Encounter for palliative care: Secondary | ICD-10-CM

## 2017-02-21 DIAGNOSIS — L03115 Cellulitis of right lower limb: Secondary | ICD-10-CM

## 2017-02-21 DIAGNOSIS — L03116 Cellulitis of left lower limb: Secondary | ICD-10-CM

## 2017-02-21 DIAGNOSIS — A419 Sepsis, unspecified organism: Principal | ICD-10-CM

## 2017-02-21 LAB — BASIC METABOLIC PANEL
Anion gap: 10 (ref 5–15)
BUN: 38 mg/dL — AB (ref 6–20)
CHLORIDE: 110 mmol/L (ref 101–111)
CO2: 22 mmol/L (ref 22–32)
Calcium: 8.5 mg/dL — ABNORMAL LOW (ref 8.9–10.3)
Creatinine, Ser: 1.14 mg/dL — ABNORMAL HIGH (ref 0.44–1.00)
GFR calc Af Amer: 47 mL/min — ABNORMAL LOW (ref >60)
GFR calc non Af Amer: 41 mL/min — ABNORMAL LOW (ref >60)
GLUCOSE: 124 mg/dL — AB (ref 65–99)
POTASSIUM: 4 mmol/L (ref 3.5–5.1)
Sodium: 142 mmol/L (ref 135–145)

## 2017-02-21 LAB — CBC
HEMATOCRIT: 36.9 % (ref 36.0–46.0)
HEMOGLOBIN: 12.7 g/dL (ref 12.0–15.0)
MCH: 32.9 pg (ref 26.0–34.0)
MCHC: 34.4 g/dL (ref 30.0–36.0)
MCV: 95.6 fL (ref 78.0–100.0)
Platelets: 190 10*3/uL (ref 150–400)
RBC: 3.86 MIL/uL — ABNORMAL LOW (ref 3.87–5.11)
RDW: 16.3 % — ABNORMAL HIGH (ref 11.5–15.5)
WBC: 12 10*3/uL — ABNORMAL HIGH (ref 4.0–10.5)

## 2017-02-21 LAB — PROTIME-INR
INR: 3.06
Prothrombin Time: 31.4 seconds — ABNORMAL HIGH (ref 11.4–15.2)

## 2017-02-21 MED ORDER — AMOXICILLIN-POT CLAVULANATE 500-125 MG PO TABS
1.0000 | ORAL_TABLET | Freq: Two times a day (BID) | ORAL | Status: DC
  Administered 2017-02-21 – 2017-02-22 (×2): 500 mg via ORAL
  Filled 2017-02-21 (×2): qty 1

## 2017-02-21 MED ORDER — WARFARIN SODIUM 1 MG PO TABS
1.5000 mg | ORAL_TABLET | Freq: Once | ORAL | Status: AC
  Administered 2017-02-21: 1.5 mg via ORAL
  Filled 2017-02-21: qty 1

## 2017-02-21 NOTE — Progress Notes (Addendum)
Nutrition Follow-up  DOCUMENTATION CODES:   Not applicable  INTERVENTION:    Ensure Enlive po BID, each supplement provides 350 kcal and 20 grams of protein  Tech or RN to provide meal assistance if no family is present  Continue to encourage PO intake of meals and supplements  NUTRITION DIAGNOSIS:   Predicted suboptimal nutrient intake related to acute illness, lethargy/confusion as evidenced by per patient/family report.  Ongoing  GOAL:   Patient will meet greater than or equal to 90% of their needs  Not meeting- 19% average intake for last 8 meals  MONITOR:   PO intake, Supplement acceptance, Weight trends, Labs  REASON FOR ASSESSMENT:   Malnutrition Screening Tool, Low Braden    ASSESSMENT:   83 y.o. female with chronic diastolic CHF, chronic atrial fibrillation on Coumadin, bullous pemphigoid, presenting to emergency department with main concern of several days duration of progressively worsening lower extremity erythema and edema, tenderness to touch. Please note that patient provides very little history and daughter at bedside was able to assist with more details. Daughter explained that patient gets home health nurse that used to come twice a week and perform wound care on lower extremities and initially patient has been doing well. As patient's lower extremity wounds improved, nurse was coming to the house once a week and daughter reports noticing worsening erythema and swelling, some blisters got worse and ruptured. Patient has history of cellulitis in lower extremities and has been hospitalized for the problem before. Daughter reports subjective fevers and chills, occasional confusion and poor oral intake.   Pt unable to answer question at time of visit. No family at bedside to give information. Spoke with RN, who reports family has been feeding patient and her appetite is off and on. RD observed meal tray at bedside with 25% eaten. The Ensure was half full. Suspect  pt is not consuming much at meal times. Meal completions charted as 19% average for last eight meals.   Weight noted be increased by 9 lb since beginning of admission. Most likely fluid related.   Limited Nutrition-Focused physical exam completed. Findings are moderate fat depletion, moderate muscle depletion, and moderate bilateral lower extremity edema. Suspect depletions are from advanced age verus prolonged poor PO intake as family said appetite was great prior to admission. Also, fluid accumulation in bilateral lower extremities may be masking losses.   Medications reviewed and include: calcium-vit D, Vit D, MVI with minerals, KCl Labs reviewed: BUN 38 (H) Creatinine 1.14 (H)  Diet Order:  DIET DYS 3 Room service appropriate? Yes; Fluid consistency: Thin  EDUCATION NEEDS:   No education needs identified at this time  Skin:  Skin Assessment: Skin Integrity Issues: Skin Integrity Issues:: Stage II (coccyx)  Last BM:  02/19/17  Height:   Ht Readings from Last 1 Encounters:  02/14/17 5\' 4"  (1.626 m)    Weight:   Wt Readings from Last 1 Encounters:  02/20/17 134 lb 14.7 oz (61.2 kg)    Ideal Body Weight:  54.54 kg  BMI:  Body mass index is 23.16 kg/m.  Estimated Nutritional Needs:   Kcal:  1305-1530 (23-27 kcal/kg)  Protein:  45-55 grams  Fluid:  >/= 1.4 L/day    Mariana Single RD, LDN Clinical Nutrition Pager # - [phone]

## 2017-02-21 NOTE — Progress Notes (Addendum)
[facility])  Met with patient's daughter Daleen Snook yesterday at hospital to discuss hospice services at home verses hospice services in a facility. Daleen Snook gave permission for patient's record to be reviewed my [facility] physician and hospice eligibility has been confirmed for home care services. She is not eligible for [facility] at this time per review of Technical brewer. Made RNCM Cookie aware. No family is present at this time. Will follow up with Daleen Snook by phone later today.   Please do not hesitate to call with questions.   [place] with daughter Daleen Snook in sitting area to make her aware patient is not eligible for [facility] at this time. Per Daleen Snook, she is working with [place] re placement. [facility] will continue to follow until disposition is determined.   If hospice services are desired at facility, please include in discharge summary an order for [facility] to follow at facility.   Please do not hesitate to call with questions.   Thank you,  Erling Conte, LCSW [phone]

## 2017-02-21 NOTE — NC FL2 (Signed)
[place] LEVEL OF CARE SCREENING TOOL     IDENTIFICATION  Patient Name: Amanda Richardson Birthdate: 08/09/1923 Sex: female Admission Date (Current Location): 02/14/2017  [facility] and [place] Number:  Herbalist and Address:  [facility],  [place], [place]      Provider Number: [id number]  Attending Physician Name and Address:  Barton Dubois, MD  Relative Name and Phone Number:       Current Level of Care: Hospital Recommended Level of Care: [place] Prior Approval Number:    Date Approved/Denied:   PASRR Number: [id number]  Discharge Plan: SNF    Current Diagnoses: Patient Active Problem List   Diagnosis Date Noted  . Cellulitis of left lower leg   . Pressure injury of skin 09/22/2016  . Acute metabolic encephalopathy 75/91/6384  . Sepsis ([place]) 09/21/2016  . Cellulitis 09/21/2016  . Permanent atrial fibrillation ([place])   . Fall   . Chronic diastolic CHF (congestive heart failure) ([place]) 07/16/2016  . Anemia 07/16/2016  . Thrombocytopenia ([place]) 07/16/2016  . Chronic venous insufficiency 07/16/2016  . Hematoma of left hip 07/13/2016  . H/O mechanical mitral valve replacement 2001   . Hypertension   . S/p nephrectomy   . H/O coronary artery bypass surgery   . Chronic atrial fibrillation ([place])   . Hypothyroidism   . Bullous pemphigoid   . Chronic anticoagulation     Orientation RESPIRATION BLADDER Height & Weight     Self, Time, Place  Normal External catheter, Incontinent Weight: 134 lb 14.7 oz (61.2 kg) Height:  5\' 4"  (162.6 cm)  BEHAVIORAL SYMPTOMS/MOOD NEUROLOGICAL BOWEL NUTRITION STATUS      Incontinent Diet (dysphasia III, thin fluid consistency)  AMBULATORY STATUS COMMUNICATION OF NEEDS Skin   Total Care Verbally PU Stage and Appropriate Care      stage II pressure wound coccyx, gauze dressing changes x2 day  Blister left upper/medial thigh, gauze dressing changes x2 day                  Personal Care Assistance Level of Assistance  Bathing, Feeding, Dressing Bathing Assistance: Maximum assistance Feeding assistance: Maximum assistance Dressing Assistance: Maximum assistance     Functional Limitations Info  Sight, Hearing, Speech Sight Info: Adequate Hearing Info: Adequate Speech Info: Adequate    SPECIAL CARE FACTORS FREQUENCY  Speech therapy             Speech Therapy Frequency: 1x      Contractures Contractures Info: Not present    Additional Factors Info  Code Status, Allergies Code Status Info: DNR Allergies Info: Heparin, Codeine, Tape           Current Medications (02/21/2017):  This is the current hospital active medication list Current Facility-Administered Medications  Medication Dose Route Frequency Provider Last Rate Last Dose  . acetaminophen (TYLENOL) suppository 650 mg  650 mg Rectal Q4H PRN Lovey Newcomer T, NP   650 mg at 02/17/17 2141  . acetaminophen (TYLENOL) tablet 650 mg  650 mg Oral Q6H PRN Theodis Blaze, MD   650 mg at 02/16/17 1250  . amoxicillin-clavulanate (AUGMENTIN) 500-125 MG per tablet 500 mg  1 tablet Oral BID Barton Dubois, MD      . bisacodyl (DULCOLAX) EC tablet 5 mg  5 mg Oral Daily PRN Theodis Blaze, MD      . calcium-vitamin D (OSCAL WITH D) 500-200 MG-UNIT per tablet 1 tablet  1 tablet Oral QPM Doyle Askew,  Angeline Slim, MD   1 tablet at 02/19/17 1804  . cholecalciferol (VITAMIN D) tablet 2,000 Units  2,000 Units Oral QPM Theodis Blaze, MD   2,000 Units at 02/19/17 1804  . clobetasol ointment (TEMOVATE) 7.40 % 1 application  1 application Topical BID PRN Theodis Blaze, MD      . feeding supplement (ENSURE ENLIVE) (ENSURE ENLIVE) liquid 237 mL  237 mL Oral BID BM Theodis Blaze, MD   237 mL at 02/21/17 0933  . fentaNYL (SUBLIMAZE) injection 12.5 mcg  12.5 mcg Intravenous Q2H PRN Micheline Rough, MD   12.5 mcg at 02/20/17 1151  . iopamidol (ISOVUE-300) 61 % injection 30 mL  30 mL Oral Once PRN Theodis Blaze,  MD   30 mL at 02/17/17 1230  . latanoprost (XALATAN) 0.005 % ophthalmic solution 1 drop  1 drop Both Eyes QHS Theodis Blaze, MD   1 drop at 02/20/17 2148  . levothyroxine (SYNTHROID, LEVOTHROID) tablet 50 mcg  50 mcg Oral QAC breakfast Theodis Blaze, MD   50 mcg at 02/21/17 0932  . metoprolol tartrate (LOPRESSOR) injection 5 mg  5 mg Intravenous Q6H PRN Theodis Blaze, MD   5 mg at 02/15/17 0118  . multivitamin with minerals tablet 1 tablet  1 tablet Oral QPM Theodis Blaze, MD   1 tablet at 02/19/17 1804  . niacin tablet 500 mg  500 mg Oral BID WC Theodis Blaze, MD   500 mg at 02/21/17 0932  . ondansetron (ZOFRAN) tablet 4 mg  4 mg Oral Q6H PRN Theodis Blaze, MD       Or  . ondansetron [facility]) injection 4 mg  4 mg Intravenous Q6H PRN Theodis Blaze, MD      . potassium chloride SA (K-DUR,KLOR-CON) CR tablet 20 mEq  20 mEq Oral Daily Theodis Blaze, MD   20 mEq at 02/21/17 0933  . timolol (TIMOPTIC) 0.5 % ophthalmic solution 1 drop  1 drop Both Eyes Daily Theodis Blaze, MD   1 drop at 02/21/17 0933  . warfarin (COUMADIN) tablet 0.5 mg  0.5 mg Oral ONCE-1800 Green, Terri L, RPH      . warfarin (COUMADIN) tablet 1.5 mg  1.5 mg Oral ONCE-1800 Berton Mount, RPH      . Warfarin - Pharmacist Dosing Inpatient   Does not apply q1800 Minda Ditto, Festus at 02/16/17 1800     Discharge Medications: Please see discharge summary for a list of discharge medications.  Relevant Imaging Results:  Relevant Lab Results:   Additional Information SSN:  [id number]. Hospice services to continue following at facility. Pt needing long-term private pay SNF placement  Nila Nephew, LCSW

## 2017-02-21 NOTE — Progress Notes (Addendum)
OT Cancellation Note  Patient Details Name: Amanda Richardson MRN: [id number] DOB: 1924/01/23   Cancelled Treatment:    Reason Eval/Treat Not Completed: Spoke to RN.  Pt limited by pain at this time  [facility] 02/21/2017, 1:42 PM  Lesle Chris, OTR/L [phone] 02/21/2017

## 2017-02-21 NOTE — Progress Notes (Signed)
Daughter Daleen Snook at bedside who states that she needs to talk with her sister at home. Plan to discharge to SNF, CSW following.

## 2017-02-21 NOTE — Progress Notes (Signed)
   02/21/17 1632  Clinical Encounter Type  Visited With Patient and family together  Visit Type Initial  Spiritual Encounters  Spiritual Needs Emotional;Prayer  Stress Factors  Family Stress Factors Health changes   Following up on Palliative list.  Initially patient was asleep.  Visited with daughter.  Some concern from daughter as plans have changed from [facility] being an option to now a Hospice supported SNF.  We talked for a while about family and the patients life.  Provided emotional support and a listening ear for the daughter.  Patient did wake up and we prayed together.  Will follow as needed. Chaplain Katherene Ponto

## 2017-02-21 NOTE — Progress Notes (Signed)
Patient's daughter declined day shift to change dressings as the plan is for patient to be discharged to SNF today and they will have to due a head to toe skin assessment along with all of the moving for transport. She did not want her mother to be exposed to that much pain. The plan is now that the patient will be discharged on 02/22/17 due to bed availability at [facility]. Day shift RN will ask night RN to change dressings. Supplies at the bedside.

## 2017-02-21 NOTE — Progress Notes (Signed)
CSW spoke with patient's daughter regarding discharge plans. Patient's daughter reported that she is interested in [facility] term care at SNF with hospice following. CSW completed FL2 and provided bed offers. Patient's daughter selected [facility] [place] SNF. Staff from [facility] SNF updated. CSW will continue to follow and assist with discharge planning.  Abundio Miu, [place] Social Worker [facility] Cell#: [phone]

## 2017-02-21 NOTE — Progress Notes (Addendum)
[place] for Warfarin Indication: mechanical MVR; atrial fib, h/o DVT  Allergies  Allergen Reactions  . Heparin Anaphylaxis  . Codeine Other (See Comments)    Unknown   . Tape     Plastic tape causes skin tears and blisters   Patient Measurements: Height: 5\' 4"  (162.6 cm) Weight: 134 lb 14.7 oz (61.2 kg) IBW/kg (Calculated) : 54.7 TBW: 56.7 kg  Vital Signs: Temp: 98.1 F (36.7 C) (10/31 0512) Temp Source: Oral (10/31 0512) BP: 151/123 (10/31 0512) Pulse Rate: 108 (10/31 0512)  Labs:  Recent Labs  02/19/17 0448 02/20/17 0440 02/21/17 0453  HGB 11.7* 12.2 12.7  HCT 34.8* 35.8* 36.9  PLT 143* 167 190  LABPROT 34.7* 34.4* 31.4*  INR 3.47 3.45 3.06  CREATININE 1.44* 1.28* 1.14*   Estimated Creatinine Clearance: 27.8 mL/min (A) (by C-G formula based on SCr of 1.14 mg/dL (H)).  Assessment: 83 y/o F with mechanical mitral valve, afib, hx DVT - home warfarin dosage reportedly 1.5 mg Tu,Th,Su; 3mg  other days - family member noted patient took 3mg  on 10/23 instead of 1.5mg   Today,02/21/2017  INR remains in therapeutic range this am at 3.06  CBC: Hgb and pltc WNL  Dysphagia 3 diet; recently charted that pt has been consuming 20 to 25% of meals.  Lower dietary intake can contribute to increased INR on warfarin  DDI: broad-spectrum antibiotic (Zosyn) may increase INR on warfarin  No bleeding reported  Patient refused meds last evening, including warfarin  Goal of Therapy:  INR goal 2.5-3.5   Plan:   Continue Warfarin 1.5 mg today  Daily Protime/INR while inpatient  Follow H/H, Pltc, any reports of bleeding  Doreene Eland, PharmD, BCPS.   Pager: [phone] 02/21/2017 8:33 AM

## 2017-02-22 DIAGNOSIS — L129 Pemphigoid, unspecified: Secondary | ICD-10-CM

## 2017-02-22 DIAGNOSIS — L03119 Cellulitis of unspecified part of limb: Secondary | ICD-10-CM

## 2017-02-22 DIAGNOSIS — E876 Hypokalemia: Secondary | ICD-10-CM

## 2017-02-22 DIAGNOSIS — I4891 Unspecified atrial fibrillation: Secondary | ICD-10-CM

## 2017-02-22 DIAGNOSIS — L02419 Cutaneous abscess of limb, unspecified: Secondary | ICD-10-CM

## 2017-02-22 DIAGNOSIS — D696 Thrombocytopenia, unspecified: Secondary | ICD-10-CM

## 2017-02-22 LAB — PROTIME-INR
INR: 2.56
Prothrombin Time: 27.3 seconds — ABNORMAL HIGH (ref 11.4–15.2)

## 2017-02-22 MED ORDER — ENSURE ENLIVE PO LIQD: 237.0000 mL | Freq: Two times a day (BID) | ORAL | 12 refills | Status: AC

## 2017-02-22 MED ORDER — AMOXICILLIN-POT CLAVULANATE 500-125 MG PO TABS: 1.0000 | ORAL_TABLET | Freq: Two times a day (BID) | ORAL | 0 refills | Status: AC

## 2017-02-22 MED ORDER — HYDROCODONE-ACETAMINOPHEN 5-325 MG PO TABS: 1.0000 | ORAL_TABLET | Freq: Four times a day (QID) | ORAL | 0 refills | Status: AC | PRN

## 2017-02-22 MED ORDER — FENTANYL CITRATE (PF) 100 MCG/2ML IJ SOLN: 12.5000 ug | INTRAMUSCULAR | 0 refills | Status: AC | PRN

## 2017-02-22 MED ORDER — ONDANSETRON 8 MG PO TBDP: 8.0000 mg | ORAL_TABLET | Freq: Three times a day (TID) | ORAL | 0 refills | Status: AC | PRN

## 2017-02-22 NOTE — Progress Notes (Signed)
Patient ID: Amanda Richardson, female   DOB: 02/21/1924, 83 y.o.   MRN: [id number]    PROGRESS NOTE  Amanda Richardson  IEP:[id number] DOB: 10/11/1923 DOA: 02/14/2017  PCP: Merrilee Seashore, MD   Brief Narrative:  83 y.o. female with chronic diastolic CHF, chronic atrial fibrillation on Coumadin, bullous pemphigoid, presenting to emergency department with main concern of several days duration of progressively worsening lower extremity erythema and edema, tenderness to touch. Please note that patient provides very little history and daughter at bedside was able to assist with more details. Daughter explained that patient gets home health nurse that used to come twice a week and perform wound care on lower extremities and initially patient has been doing well. As patient's lower extremity wounds improved, nurse was coming to the house once a week and daughter reports noticing worsening erythema and swelling, some blisters got worse and ruptured. Patient has history of cellulitis in lower extremities and has been hospitalized for the problem before. Daughter reports subjective fevers and chills, occasional confusion and poor oral intake. Patient denies chest pain, no shortness of breath, no specific abdominal or urinary concerns.  Assessment & Plan:  Active Problems: Sepsis secondary to bilateral lower extremity cellulitis - Please note that patient met criteria for sepsis on admission. -overall sepsis features resolving -will transition abx's to PO to complete treatment. - WBC overall trending down, no further escalation of care   Atrial fibrillation with RVR - Patient on Coreg at home as well as Coumadin - Coreg has been held due to low blood pressure - cont Coumadin as per family request - HR has been in 50-100's, if remains elevated > 90, consider placing back on Coreg home regimen .  Acute kidney injury imposed on CKD stage III - Baseline creatinine about 1.2 - Creatinine up on this  admission likely prerenal in etiology - lasix and lisinopril discontinued - encourage to maintain adequate hydration - Cr was trending down on last check; no further blood work anticipated. - avoiding fluid as pt is significant pain and does not want to have more IV lines  Bullous pemphigoid - has been following with dermatologist but seems like has been getting worse recently  - still with significant pain, moaning with even slight touch - will discuss and follow rec's for analgesia with palliative care.with PCT. - pt to finish antibiotics by mouth -main goal is comfort  -continue pain meds and supportive care  Hypokalemia -has been supplemented  -encourage PO intake as tolerated   Thrombocytopenia - suspect reactive - no signs of bleeding -no further blood draws plan for now    Physical deconditioning - DNR code status  - found not to be inpatient hospice eligible -will pursuit SNF with palliative care   DVT prophylaxis: Coumadin  Code Status: DNR Family Communication: family at bedside  Disposition Plan: after discussing with patient's daughter plan is for placement at SNF with active palliative care follow up. Main focus is comfort care.  Consultants:   Palliative Care  Procedures:   None  Antimicrobials:   Vancomycin 10/24 --> 10/26  Zosyn 10/24 --> 10/31  augmentin 10/31  Subjective: Patient is more alert, no CP, no SOB. No nausea, no vomiting. Continue to have pain her legs.   Objective: Vitals:   02/20/17 2055 02/21/17 0512 02/21/17 1345 02/21/17 2212  BP: (!) 150/63 (!) 151/123 (!) 124/56 (!) 143/60  Pulse: (!) 43 (!) 108 (!) 107 (!) 106  Resp: 18 20 (!) 21 (!) 22  Temp: 98.8 F (37.1 C) 98.1 F (36.7 C) 99.8 F (37.7 C) 98.9 F (37.2 C)  TempSrc: Oral Oral Oral Oral  SpO2: 98% 100% 96% 95%  Weight:      Height:        Intake/Output Summary (Last 24 hours) at 02/22/17 0037 Last data filed at 02/21/17 1347  Gross per 24 hour  Intake               560 ml  Output              850 ml  Net             -290 ml   Filed Weights   02/18/17 0402 02/19/17 0551 02/20/17 0500  Weight: 64.6 kg (142 lb 6.4 oz) 61.4 kg (135 lb 6.4 oz) 61.2 kg (134 lb 14.7 oz)    Physical Exam  Constitutional: afebrile, was able to follow simple commands and answer questions. No CP, no SOB. Patient continue expressed severe pain in her skin and LE's. Heart: no JV, no rubs, no gallops Lungs: no wheezing, no crackles Abd: soft, NT, ND, positive BS' Extremities/skin: with open breakdowns and in her skin and diffuse erythema, some intact pemphigoid bullae appreciated. No cyanosis and no clubbing.  Data Reviewed: I have personally reviewed following labs and imaging studies  CBC:  Recent Labs Lab 02/17/17 0455 02/18/17 0449 02/19/17 0448 02/20/17 0440 02/21/17 0453  WBC 25.6* 22.2* 15.0* 11.2* 12.0*  HGB 12.0 11.8* 11.7* 12.2 12.7  HCT 34.5* 34.8* 34.8* 35.8* 36.9  MCV 92.7 93.5 94.1 95.2 95.6  PLT 95* 118* 143* 167 938   Basic Metabolic Panel:  Recent Labs Lab 02/17/17 0455 02/18/17 0449 02/19/17 0448 02/20/17 0440 02/21/17 0453  NA 142 141 142 141 142  K 4.0 3.7 3.9 4.2 4.0  CL 112* 111 113* 111 110  CO2 21* 22 21* 21* 22  GLUCOSE 126* 122* 108* 113* 124*  BUN 58* 56* 58* 46* 38*  CREATININE 1.67* 1.44* 1.44* 1.28* 1.14*  CALCIUM 8.4* 8.3* 8.5* 8.5* 8.5*  MG 2.1  --   --   --   --    Coagulation Profile:  Recent Labs Lab 02/17/17 0455 02/18/17 0449 02/19/17 0448 02/20/17 0440 02/21/17 0453  INR 4.62* 3.73 3.47 3.45 3.06   Urine analysis:    Component Value Date/Time   COLORURINE YELLOW 09/21/2016 0638   APPEARANCEUR CLEAR 09/21/2016 0638   LABSPEC 1.016 09/21/2016 0638   PHURINE 5.0 09/21/2016 0638   GLUCOSEU NEGATIVE 09/21/2016 0638   HGBUR MODERATE (A) 09/21/2016 0638   BILIRUBINUR NEGATIVE 09/21/2016 0638   KETONESUR NEGATIVE 09/21/2016 0638   PROTEINUR 100 (A) 09/21/2016 0638   UROBILINOGEN 4.0 (H)  08/04/2014 1608   NITRITE NEGATIVE 09/21/2016 0638   LEUKOCYTESUR NEGATIVE 09/21/2016 1017   Radiology Studies: No results found.  Scheduled Meds: . amoxicillin-clavulanate  1 tablet Oral BID  . calcium-vitamin D  1 tablet Oral QPM  . cholecalciferol  2,000 Units Oral QPM  . feeding supplement (ENSURE ENLIVE)  237 mL Oral BID BM  . latanoprost  1 drop Both Eyes QHS  . levothyroxine  50 mcg Oral QAC breakfast  . multivitamin with minerals  1 tablet Oral QPM  . niacin  500 mg Oral BID WC  . potassium chloride SA  20 mEq Oral Daily  . timolol  1 drop Both Eyes Daily  . Warfarin - Pharmacist Dosing Inpatient   Does not apply q1800   Continuous Infusions:  LOS: 8 days   Time spent: 25 minutes   Barton Dubois, MD [facility] Pager [phone]  If 7PM-7AM, please contact night-coverage [email or web address] Password [facility] 02/22/2017, 12:37 AM

## 2017-02-22 NOTE — Clinical Social Work Placement (Signed)
Patient received and accepted bed offer at [facility]. Facility aware of patient's discharge and confirmed bed offer. PTAR contacted, patient's family notified. Patient's RN can call report to [phone], packet complete. CSW signing off, no other needs identified.  CLINICAL SOCIAL WORK PLACEMENT  NOTE  Date:  02/22/2017  Patient Details  Name: Amanda Richardson MRN: [id number] Date of Birth: [birth year]  Clinical Social Work is seeking post-discharge placement for this patient at the [place] level of care (*CSW will initial, date and re-position this form in  chart as items are completed):  Yes   Patient/family provided with [facility] Work Department's list of facilities offering this level of care within the geographic area requested by the patient (or if unable, by the patient's family).  Yes   Patient/family informed of their freedom to choose among providers that offer the needed level of care, that participate in Medicare, Medicaid or managed care program needed by the patient, have an available bed and are willing to accept the patient.  Yes   Patient/family informed of [facility] [place]'s ownership interest in [facility] and [facility], as well as of the fact that they are under no obligation to receive care at these facilities.  PASRR submitted to EDS on       PASRR number received on       Existing PASRR number confirmed on 02/21/17     FL2 transmitted to all facilities in geographic area requested by pt/family on 02/21/17     FL2 transmitted to all facilities within larger geographic area on       Patient informed that his/her managed care company has contracts with or will negotiate with certain facilities, including the following:        Yes   Patient/family informed of bed offers received.  Patient chooses bed at [facility]     Physician recommends and patient chooses bed at      Patient to be transferred to [facility] on  02/22/17.  Patient to be transferred to facility by PTAR     Patient family notified on 02/22/17 of transfer.  Name of family member notified:  Jason Fila     PHYSICIAN       Additional Comment:    _______________________________________________ Burnis Medin, LCSW 02/22/2017, 11:19 AM

## 2017-02-22 NOTE — Discharge Summary (Signed)
Physician Discharge Summary  Amanda Richardson GBT:[id number] DOB: [birth year] DOA: 02/14/2017  PCP: Merrilee Seashore, MD  Admit date: 02/14/2017 Discharge date: 02/22/2017  Time spent: 35 minutes  Recommendations for Outpatient Follow-up:  1. Palliative care following patient  2. Avoid future hospital admissions 3. Base on patient further response consider checking blood work to follow platelets counts, electrolytes and renal function. 4. Main goal is comfort care.  Discharge Diagnoses:  Sepsis due to to LE cellulitis Bilateral Cellulitis of lower leg A. Fib Acute on chronic renal failure; stage 3 at baseline  Bullous pemphigoid  Hypokalemia Thrombocytopenia Physical deconditioning    Discharge Condition: stable overall and currently comfortable. No CP, no SOB. Will discharge to SNF with active Palliative care follow up.  Diet recommendation: watch sodium intake; comfort feeding   Filed Weights   02/19/17 0551 02/20/17 0500 02/22/17 0619  Weight: 61.4 kg (135 lb 6.4 oz) 61.2 kg (134 lb 14.7 oz) 60.8 kg (134 lb 1.6 oz)    History of present illness:  83 y.o.femalewith chronic diastolic CHF, chronic atrial fibrillation on Coumadin, bullous pemphigoid, presenting to emergency department with main concern ofseveral days duration of progressively worsening lower extremity erythema and edema, tenderness to touch. Please note that patient provides very little history and daughter at bedside was able to assist with more details. Daughter explained that patient gets home health nurse that used to come twice a week and perform wound care on lower extremities and initially patient has been doing well. As patient's lower extremity wounds improved, nurse was coming to the house once a week and daughter reports noticing worsening erythema and swelling, some blisters got worse and ruptured. Patient has history of cellulitis in lower extremities and has been hospitalized for the problem  before. Daughter reports subjective fevers and chills, occasional confusion and poor oral intake. Patient denies chest pain, no shortness of breath, no specific abdominal or urinary concerns.  Hospital Course:  Sepsis secondary to bilateral lower extremity cellulitis - Please note that patient met criteria for sepsis on admission. -overall sepsis features resolving -will transition abx's to PO to complete treatment. - WBC overall trended down, no further escalation of care is anticipating -will discharge to SNF with active palliative care follow up.  Atrial fibrillation with RVR - Patient was on Coreg at home as well as Coumadin - Coreg has been held due to low blood pressure - cont Coumadin as per family request - HR has been controlled. -further discussion at facility and probably stopping coumadin in near future. -overall goal is comfort.  Acute kidney injury imposed on CKD stage III - Baseline creatinine about 1.2 - Creatinine up on this admission likely prerenal in etiology - lasix and lisinopril discontinued - encourage to maintain adequate hydration - Cr was trending down on last check; no further blood work anticipated for now; but can be considered base on patient future clinical response. -advise to maintain adequate oral hydration.   Bullous pemphigoid -has been following with dermatologist but seems like has been getting worse recently  -still with significant pain, moaning with even slight touch -will discuss and follow rec's for analgesia with palliative care -pt to finish antibiotics by mouth as instructed -main goal is comfort  -continue pain meds and supportive care  Hypokalemia -has been supplemented  -encourage PO intake as tolerated  -base on patient clinical response, determine to follow blood work in near future to assess electrolytes.  Thrombocytopenia - suspect reactive - no signs of bleeding -base on  patient clinical response, determine to  follow blood work in near future to assess platelets trend.  Physical deconditioning -DNR code status clarify -found not to be inpatient hospice eligible -family will like to pursuit SNF with palliative care following her along -main goal is focusing on comfort and do what she can.  Procedures:  See below for x-ray reports   Consultations:  Palliative care   Discharge Exam: Vitals:   02/21/17 2212 02/22/17 0619  BP: (!) 143/60 (!) 145/67  Pulse: (!) 106 (!) 118  Resp: (!) 22 (!) 24  Temp: 98.9 F (37.2 C) 98 F (36.7 C)  SpO2: 95% 100%   Constitutional: afebrile, able to follow simple commands and answer questions. No CP, no SOB. Patient continue to expressed severe pain in her LE's and on her skin. Decrease oral intake and feeling weal/tired. Heart: no JV, no rubs, no gallops Lungs: no wheezing, no crackles Abd: soft, NT, ND, positive BS' Extremities/skin: with open breakdowns and in her skin and diffuse erythema, some intact pemphigoid bullae appreciated. No cyanosis and no clubbing.    Discharge Instructions   Discharge Instructions    Discharge instructions    Complete by:  As directed    Continue care and wound care. Palliative care to actively follow patient at SNF. Continue transition to full comfort care and tailor her medication list to reflects that (especially further discussion with family and coumadin discontinuation). Avoid new hospitalizations if possible. Maintain adequate hydration     Current Discharge Medication List    START taking these medications   Details  amoxicillin-clavulanate (AUGMENTIN) 500-125 MG tablet Take 1 tablet (500 mg total) by mouth 2 (two) times daily. Qty: 6 tablet, Refills: 0    feeding supplement, ENSURE ENLIVE, (ENSURE ENLIVE) LIQD Take 237 mLs by mouth 2 (two) times daily between meals. Qty: 237 mL, Refills: 12    fentaNYL (SUBLIMAZE) 100 MCG/2ML injection Inject 0.25 mLs (12.5 mcg total) into the vein every 2 (two)  hours as needed (Please give 15 minutes prior to bedding or dressing changes.). Qty: 4 mL, Refills: 0    ondansetron (ZOFRAN ODT) 8 MG disintegrating tablet Take 1 tablet (8 mg total) by mouth every 8 (eight) hours as needed for nausea or vomiting. Qty: 20 tablet, Refills: 0      CONTINUE these medications which have CHANGED   Details  HYDROcodone-acetaminophen (NORCO/VICODIN) 5-325 MG tablet Take 1 tablet by mouth every 6 (six) hours as needed for moderate pain. Qty: 20 tablet, Refills: 0      CONTINUE these medications which have NOT CHANGED   Details  acetaminophen (TYLENOL) 325 MG tablet Take 2 tablets (650 mg total) by mouth every 6 (six) hours as needed for mild pain (or Fever >/= 101).    Cholecalciferol 2000 units CAPS Take 1 capsule by mouth every evening.    latanoprost (XALATAN) 0.005 % ophthalmic solution Place 1 drop into both eyes at bedtime.     levothyroxine (SYNTHROID, LEVOTHROID) 50 MCG tablet Take 50 mcg by mouth daily. Refills: 6    Multiple Vitamin (MULTIVITAMIN WITH MINERALS) TABS Take 1 tablet by mouth every evening.     Probiotic Product (PROBIOTIC PO) Take 1 capsule by mouth every other day.    timolol (BETIMOL) 0.5 % ophthalmic solution Place 1 drop into both eyes daily.    warfarin (COUMADIN) 3 MG tablet Take 1.5-3 mg by mouth as directed.     bisacodyl (DULCOLAX) 5 MG EC tablet Take 1 tablet (5 mg total)  by mouth daily as needed for moderate constipation. Qty: 30 tablet, Refills: 0    clobetasol ointment (TEMOVATE) 4.85 % Apply 1 application topically 2 (two) times daily as needed (blisters for bulous pemphigoid).       STOP taking these medications     calcium-vitamin D (OSCAL WITH D) 500-200 MG-UNIT per tablet      carvedilol (COREG) 6.25 MG tablet      doxycycline (VIBRAMYCIN) 100 MG capsule      furosemide (LASIX) 40 MG tablet      KLOR-CON M20 20 MEQ tablet      lisinopril (PRINIVIL,ZESTRIL) 10 MG tablet      niacinamide 500 MG  tablet      amoxicillin (AMOXIL) 500 MG capsule      lisinopril (ZESTRIL) 2.5 MG tablet        Allergies  Allergen Reactions  . Heparin Anaphylaxis  . Codeine Other (See Comments)    Unknown   . Tape     Plastic tape causes skin tears and blisters    The results of significant diagnostics from this hospitalization (including imaging, microbiology, ancillary and laboratory) are listed below for reference.    Significant Diagnostic Studies: Ct Abdomen Pelvis Wo Contrast  Result Date: 02/17/2017 CLINICAL DATA:  Sepsis secondary to bilateral lower extremity cellulitis, stage three renal failure/ sob, weakness EXAM: CT CHEST, ABDOMEN AND PELVIS WITHOUT CONTRAST TECHNIQUE: Multidetector CT imaging of the chest, abdomen and pelvis was performed following the standard protocol without IV contrast. COMPARISON:  09/20/2016 chest x-ray FINDINGS: CT CHEST FINDINGS Cardiovascular: The heart is markedly enlarged. There is massive enlargement of the left atrium. Moderate enlargement of the right atrium. Coronary artery calcifications are present. There is mitral annulus repair. Status post median sternotomy. There is atherosclerotic calcification of the thoracic aorta not associated with aneurysm. Mediastinum/Nodes: The visualized portion of the thyroid gland has a normal appearance. No mediastinal, hilar, or axillary adenopathy. Lungs/Pleura: There are small bilateral pleural effusions. There is bibasilar atelectasis. Musculoskeletal: Marked kyphosis degenerative changes are present. CT ABDOMEN PELVIS FINDINGS Hepatobiliary: The liver is homogeneous. No focal mass identified. Gallbladder is present. Dependent calcifications are noted in the gallbladder. Pancreas: Unremarkable. No pancreatic ductal dilatation or surrounding inflammatory changes. Spleen: Normal in size without focal abnormality. Adrenals/Urinary Tract: Normal appearance of the adrenal glands. There is a small hyperdense lesion involving the  upper pole the right kidney, measuring less than 1 cm and stable in appearance, consistent with hemorrhagic cyst. There is chronic marked left hydronephrosis and loss of renal parenchyma. Urinary bladder is partially obscured but appears normal. Stomach/Bowel: The stomach and small bowel loops are normal in appearance. There is dilatation of the colonic loops, containing air-fluid levels and compatible with diarrhea. Vascular/Lymphatic: There is significant atherosclerotic calcification of the abdominal aorta. No aneurysm. Reproductive: The uterus is present. Portions of the pelvis are obscured by metallic artifact in the left hip. Other: Diffuse body wall edema. Musculoskeletal: Mild degenerative changes in the lumbar spine at L5-S1. No acute fracture. Status post median sternotomy. Left hip arthroplasty. IMPRESSION: 1. Marked enlargement of the left eighth item. Mitral annulus repair. 2.  Aortic atherosclerosis.  (ICD10-I70.0) 3. Coronary artery disease. 4. Small bilateral pleural effusions and bibasilar atelectasis. 5. Cholelithiasis. 6. Long-standing obstruction of the left kidney with resulting loss of chronic hydronephrosis and loss of renal parenchyma. 7. Dilated, fluid-filled colon. 8. Diffuse body wall edema. 9. Status post left hip arthroplasty. 10. Exaggerated thoracic kyphosis and lumbar lordosis. Electronically Signed   By:  Nolon Nations M.D.   On: 02/17/2017 16:09   Ct Chest Wo Contrast  Result Date: 02/17/2017 CLINICAL DATA:  Sepsis secondary to bilateral lower extremity cellulitis, stage three renal failure/ sob, weakness EXAM: CT CHEST, ABDOMEN AND PELVIS WITHOUT CONTRAST TECHNIQUE: Multidetector CT imaging of the chest, abdomen and pelvis was performed following the standard protocol without IV contrast. COMPARISON:  09/20/2016 chest x-ray FINDINGS: CT CHEST FINDINGS Cardiovascular: The heart is markedly enlarged. There is massive enlargement of the left atrium. Moderate enlargement of the  right atrium. Coronary artery calcifications are present. There is mitral annulus repair. Status post median sternotomy. There is atherosclerotic calcification of the thoracic aorta not associated with aneurysm. Mediastinum/Nodes: The visualized portion of the thyroid gland has a normal appearance. No mediastinal, hilar, or axillary adenopathy. Lungs/Pleura: There are small bilateral pleural effusions. There is bibasilar atelectasis. Musculoskeletal: Marked kyphosis degenerative changes are present. CT ABDOMEN PELVIS FINDINGS Hepatobiliary: The liver is homogeneous. No focal mass identified. Gallbladder is present. Dependent calcifications are noted in the gallbladder. Pancreas: Unremarkable. No pancreatic ductal dilatation or surrounding inflammatory changes. Spleen: Normal in size without focal abnormality. Adrenals/Urinary Tract: Normal appearance of the adrenal glands. There is a small hyperdense lesion involving the upper pole the right kidney, measuring less than 1 cm and stable in appearance, consistent with hemorrhagic cyst. There is chronic marked left hydronephrosis and loss of renal parenchyma. Urinary bladder is partially obscured but appears normal. Stomach/Bowel: The stomach and small bowel loops are normal in appearance. There is dilatation of the colonic loops, containing air-fluid levels and compatible with diarrhea. Vascular/Lymphatic: There is significant atherosclerotic calcification of the abdominal aorta. No aneurysm. Reproductive: The uterus is present. Portions of the pelvis are obscured by metallic artifact in the left hip. Other: Diffuse body wall edema. Musculoskeletal: Mild degenerative changes in the lumbar spine at L5-S1. No acute fracture. Status post median sternotomy. Left hip arthroplasty. IMPRESSION: 1. Marked enlargement of the left eighth item. Mitral annulus repair. 2.  Aortic atherosclerosis.  (ICD10-I70.0) 3. Coronary artery disease. 4. Small bilateral pleural effusions and  bibasilar atelectasis. 5. Cholelithiasis. 6. Long-standing obstruction of the left kidney with resulting loss of chronic hydronephrosis and loss of renal parenchyma. 7. Dilated, fluid-filled colon. 8. Diffuse body wall edema. 9. Status post left hip arthroplasty. 10. Exaggerated thoracic kyphosis and lumbar lordosis. Electronically Signed   By: Nolon Nations M.D.   On: 02/17/2017 16:09    Microbiology: Recent Results (from the past 240 hour(s))  Culture, blood (x 2)     Status: None   Collection Time: 02/14/17 10:56 PM  Result Value Ref Range Status   Specimen Description BLOOD RIGHT ARM  Final   Special Requests   Final    BOTTLES DRAWN AEROBIC ONLY Blood Culture adequate volume   Culture   Final    NO GROWTH 5 DAYS Performed at [facility], [address] [place], [place] [address]    Report Status 02/20/2017 FINAL  Final  Culture, blood (x 2)     Status: None   Collection Time: 02/14/17 10:56 PM  Result Value Ref Range Status   Specimen Description BLOOD RIGHT HAND  Final   Special Requests IN PEDIATRIC BOTTLE Blood Culture adequate volume  Final   Culture   Final    NO GROWTH 5 DAYS Performed at [facility], [place] [address] [place], [place] [address]    Report Status 02/20/2017 FINAL  Final  C difficile quick scan w PCR reflex  Status: None   Collection Time: 02/18/17 12:48 AM  Result Value Ref Range Status   C Diff antigen NEGATIVE NEGATIVE Final   C Diff toxin NEGATIVE NEGATIVE Final   C Diff interpretation No C. difficile detected.  Final    Labs: Basic Metabolic Panel:  Recent Labs Lab 02/17/17 0455 02/18/17 0449 02/19/17 0448 02/20/17 0440 02/21/17 0453  NA 142 141 142 141 142  K 4.0 3.7 3.9 4.2 4.0  CL 112* 111 113* 111 110  CO2 21* 22 21* 21* 22  GLUCOSE 126* 122* 108* 113* 124*  BUN 58* 56* 58* 46* 38*  CREATININE 1.67* 1.44* 1.44* 1.28* 1.14*  CALCIUM 8.4* 8.3* 8.5* 8.5* 8.5*  MG 2.1  --   --   --   --    CBC:  Recent Labs Lab  02/17/17 0455 02/18/17 0449 02/19/17 0448 02/20/17 0440 02/21/17 0453  WBC 25.6* 22.2* 15.0* 11.2* 12.0*  HGB 12.0 11.8* 11.7* 12.2 12.7  HCT 34.5* 34.8* 34.8* 35.8* 36.9  MCV 92.7 93.5 94.1 95.2 95.6  PLT 95* 118* 143* 167 190   BNP (last 3 results)  Recent Labs  09/21/16 0138  BNP 1,143.1*   Signed:  Barton Dubois MD.  [facility] 02/22/2017, 7:50 AM

## 2017-02-22 NOTE — Progress Notes (Signed)
Physical Therapy Treatment Patient Details Name: Amanda Richardson MRN: [id number] DOB: 08/06/1923 Today's Date: 02/22/2017    History of Present Illness 83 y.o. female with chronic diastolic CHF, chronic atrial fibrillation on Coumadin, bullous pemphigoid, CAD, CABG, CVA, ischemic bowel disease and admitted for sepsis due to bilateral LE cellulitis    PT Comments    Pt denied pain at rest but moaned and cried with minimal movement of BLEs. Pt not able to tolerate activity. She was not able to state her birthdate today, daughter reports until now she had been able to do this correctly.  Pt is not progressing with PT.   Follow Up Recommendations  SNF;Supervision/Assistance - 24 hour     Equipment Recommendations  Hospital bed    Recommendations for Other Services       Precautions / Restrictions Precautions Precautions: Fall Precaution Comments: incontinent Restrictions Weight Bearing Restrictions: No    Mobility  Bed Mobility Overal bed mobility: Needs Assistance             General bed mobility comments: pt unable to tolerate due to pain with minimal movement of BLEs  Transfers                 General transfer comment: Pt unable to tolerate  Ambulation/Gait                 Stairs            Wheelchair Mobility    Modified Rankin (Stroke Patients Only)       Balance                                            Cognition Arousal/Alertness: Awake/alert Behavior During Therapy: WFL for tasks assessed/performed Overall Cognitive Status: Impaired/Different from baseline Area of Impairment: Attention;Following commands;Problem solving;Orientation                               General Comments: pt verbalizes very little, mostly cries out or moans  as if in pain; not able to state her birthdate (daughter stated pt has been able to state birthdate up until now)      Exercises General Exercises - Lower  Extremity Ankle Circles/Pumps: AROM;Both;10 reps;Supine Heel Slides: AAROM;Other (comment);Both;Supine (3 reps on L, 1 rep on R -pt unable to tolerate 2* pain)    General Comments        Pertinent Vitals/Pain Faces Pain Scale: Hurts whole lot Pain Location: pt denies pain at rest or when asked--cries out with any movement of B legs Pain Descriptors / Indicators: Moaning;Tender;Grimacing;Guarding Pain Intervention(s): Limited activity within patient's tolerance;Monitored during session;Repositioned    Home Living                      Prior Function            PT Goals (current goals can now be found in the care plan section) Acute Rehab PT Goals Patient Stated Goal: Pt did not state.  Family hopes she will progress, but also want her comfortable  PT Goal Formulation: With patient/family Time For Goal Achievement: 03/02/17 Potential to Achieve Goals: Fair Progress towards PT goals: Not progressing toward goals - comment (pain limiting tolerance of activity)    Frequency    Min 2X/week      PT  Plan Current plan remains appropriate    Co-evaluation              AM-PAC PT "6 Clicks" Daily Activity  Outcome Measure  Difficulty turning over in bed (including adjusting bedclothes, sheets and blankets)?: Unable Difficulty moving from lying on back to sitting on the side of the bed? : Unable Difficulty sitting down on and standing up from a chair with arms (e.g., wheelchair, bedside commode, etc,.)?: Unable Help needed moving to and from a bed to chair (including a wheelchair)?: Total Help needed walking in hospital room?: Total Help needed climbing 3-5 steps with a railing? : Total 6 Click Score: 6    End of Session   Activity Tolerance: Patient limited by pain Patient left: in bed;with call bell/phone within reach;with bed alarm set;with nursing/sitter in room;with family/visitor present Nurse Communication: Mobility status PT Visit Diagnosis: Muscle  weakness (generalized) (M62.81)     Time: 6190-1222 PT Time Calculation (min) (ACUTE ONLY): 9 min  Charges:  $Therapeutic Exercise: 8-22 mins                    G Codes:          [name] 02/22/2017, 10:32 AM [phone]

## 2017-02-22 NOTE — Progress Notes (Signed)
Amanda Richardson for Warfarin Indication: mechanical MVR; atrial fib, h/o DVT  Allergies  Allergen Reactions  . Heparin Anaphylaxis  . Codeine Other (See Comments)    Unknown   . Tape     Plastic tape causes skin tears and blisters   Patient Measurements: Height: 5\' 4"  (162.6 cm) Weight: 134 lb 1.6 oz (60.8 kg) IBW/kg (Calculated) : 54.7 TBW: 56.7 kg  Vital Signs: Temp: 98 F (36.7 C) (11/01 0619) Temp Source: Oral (11/01 0619) BP: 145/67 (11/01 0619) Pulse Rate: 118 (11/01 0619)  Labs:  Recent Labs  02/20/17 0440 02/21/17 0453 02/22/17 0429  HGB 12.2 12.7  --   HCT 35.8* 36.9  --   PLT 167 190  --   LABPROT 34.4* 31.4* 27.3*  INR 3.45 3.06 2.56  CREATININE 1.28* 1.14*  --    Estimated Creatinine Clearance: 27.8 mL/min (A) (by C-G formula based on SCr of 1.14 mg/dL (H)).  Assessment: 83 y/o F with mechanical mitral valve, afib, hx DVT - home warfarin dosage reportedly 1.5 mg Tu,Th,Su; 3mg  other days - family member noted patient took 3mg  on 10/23 instead of 1.5mg   Today,02/22/2017  INR remains in therapeutic range but trending down (refused dose 10/30)  CBC: Hgb and pltc WNL  Dysphagia 3 diet; recently charted that pt has been consuming 20 to 25% of meals.  Lower dietary intake can contribute to increased INR on warfarin  DDI: broad-spectrum antibiotic (Zosyn) may increase INR on warfarin  No bleeding reported  Patient refused meds 10/30, including warfarin  Goal of Therapy:  INR goal 2.5-3.5   Plan:   For discharge today,  Agree prior to admission warfarin dosing appropriate.  If to remain inpatient will order warfarin 3mg  PO x 1 for INR trending down to low end of goal (dose refused 10/30)  Daily Protime/INR while inpatient  Follow H/H, Pltc, any reports of bleeding  Doreene Eland, PharmD, BCPS.   Pager: [phone] 02/22/2017 8:21 AM

## 2017-02-22 NOTE — Progress Notes (Signed)
Patient discharged to [facility] via ambulance, discharge instructions given and explained to patient's daughter. Report called to facility given to Pam-LPN. Discharge packet prepared by CSW and given to EMS for facility. Pain medication given to patient prior to transportation. Daughter at the bedside during transportation to facility. PIV in place to continue pain management at the facility. IV clean/dry/intact. Blisters on legs, dressing clean/dry/intact and the daughter did NOT want dressing change done again prior to discharge because of pain.

## 2017-03-24 DEATH — deceased

## 2017-12-11 IMAGING — DX DG HIP (WITH OR WITHOUT PELVIS) 1V PORT*L*
3 series · 3 of 3 positions shown · non-contrast
Comparison: Radiographs July 13, 2016.

CLINICAL DATA: Acute left hip pain without known injury.

EXAM:
DG HIP (WITH OR WITHOUT PELVIS) 1V PORT LEFT

[pelvis ap]
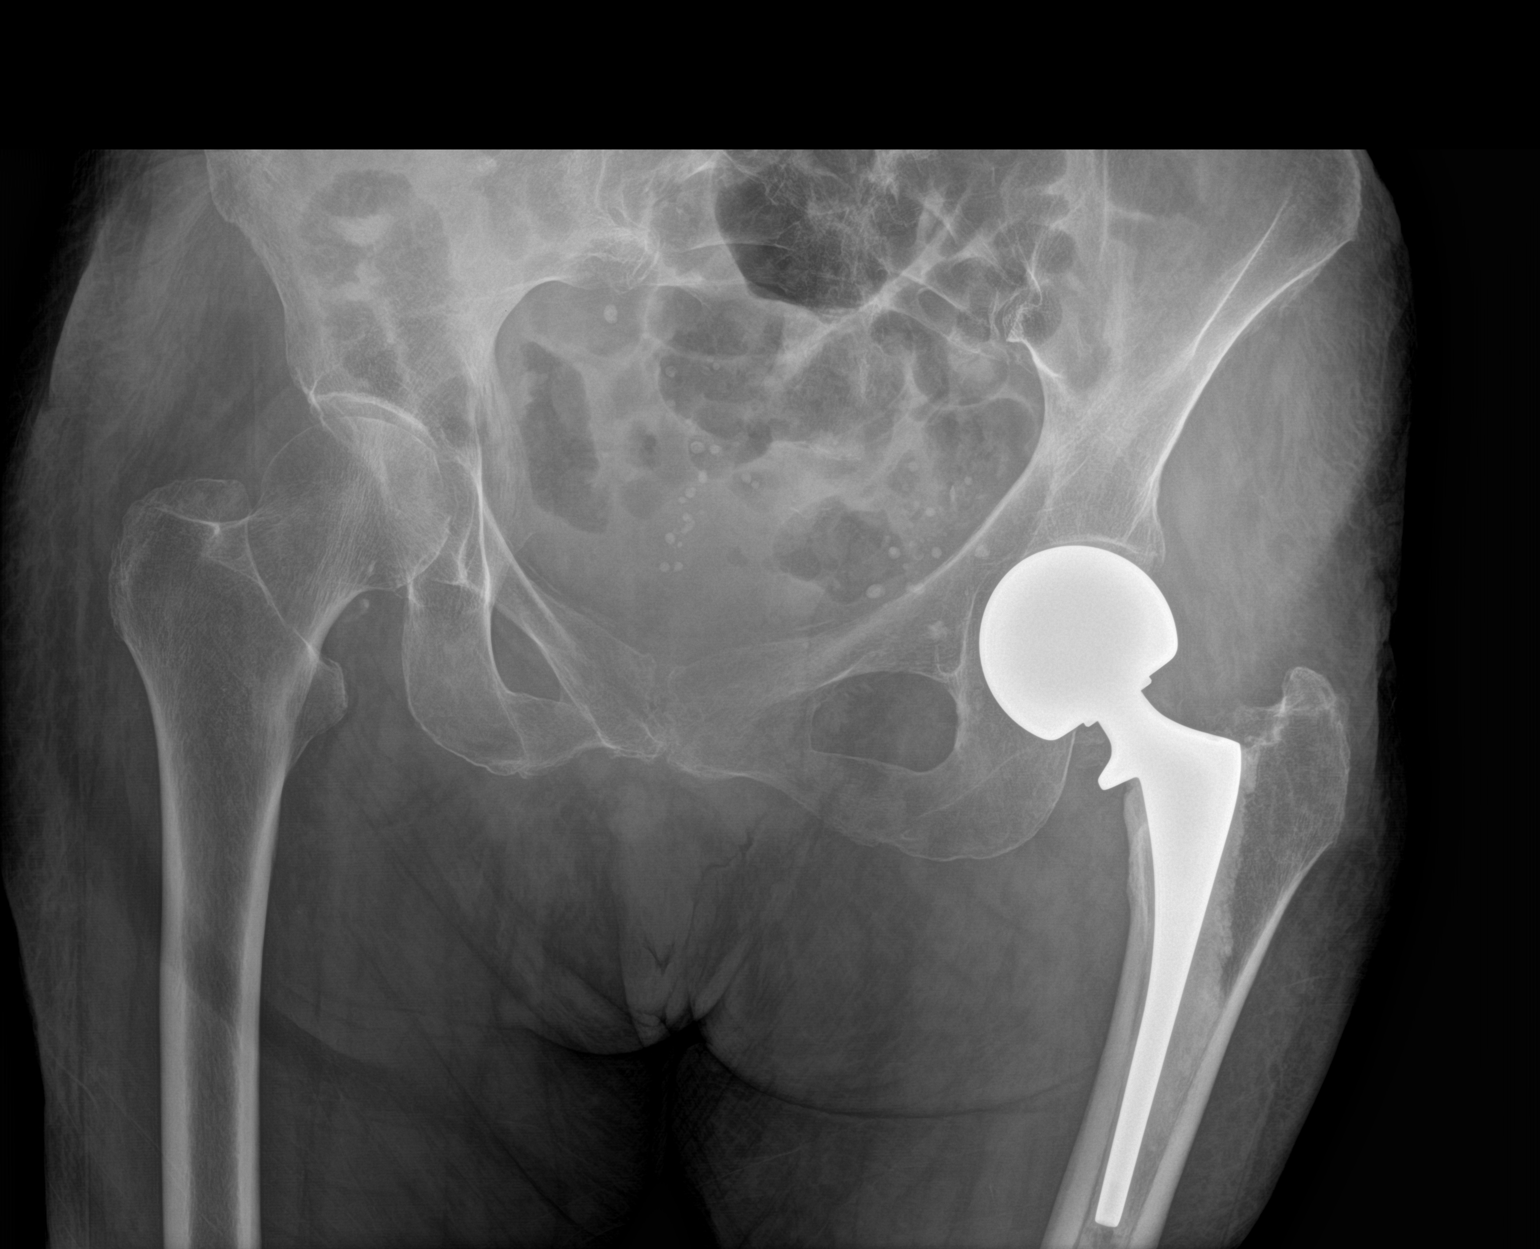

[hip ap]
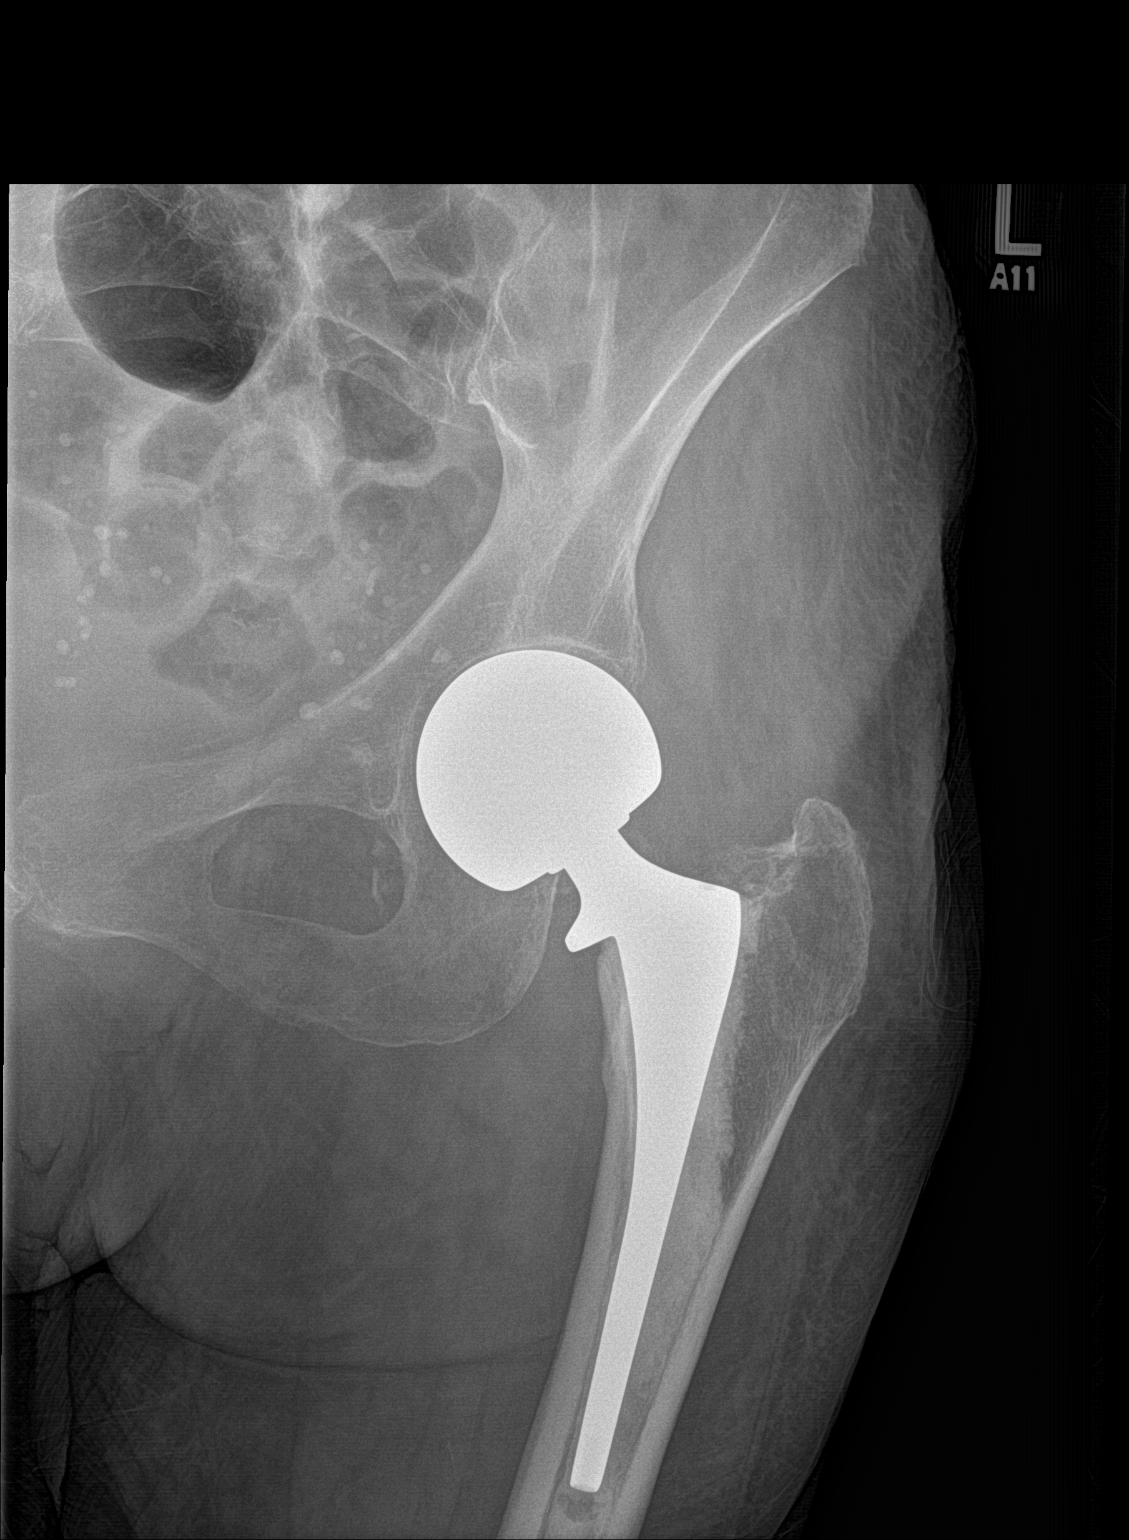

[hip frog leg]
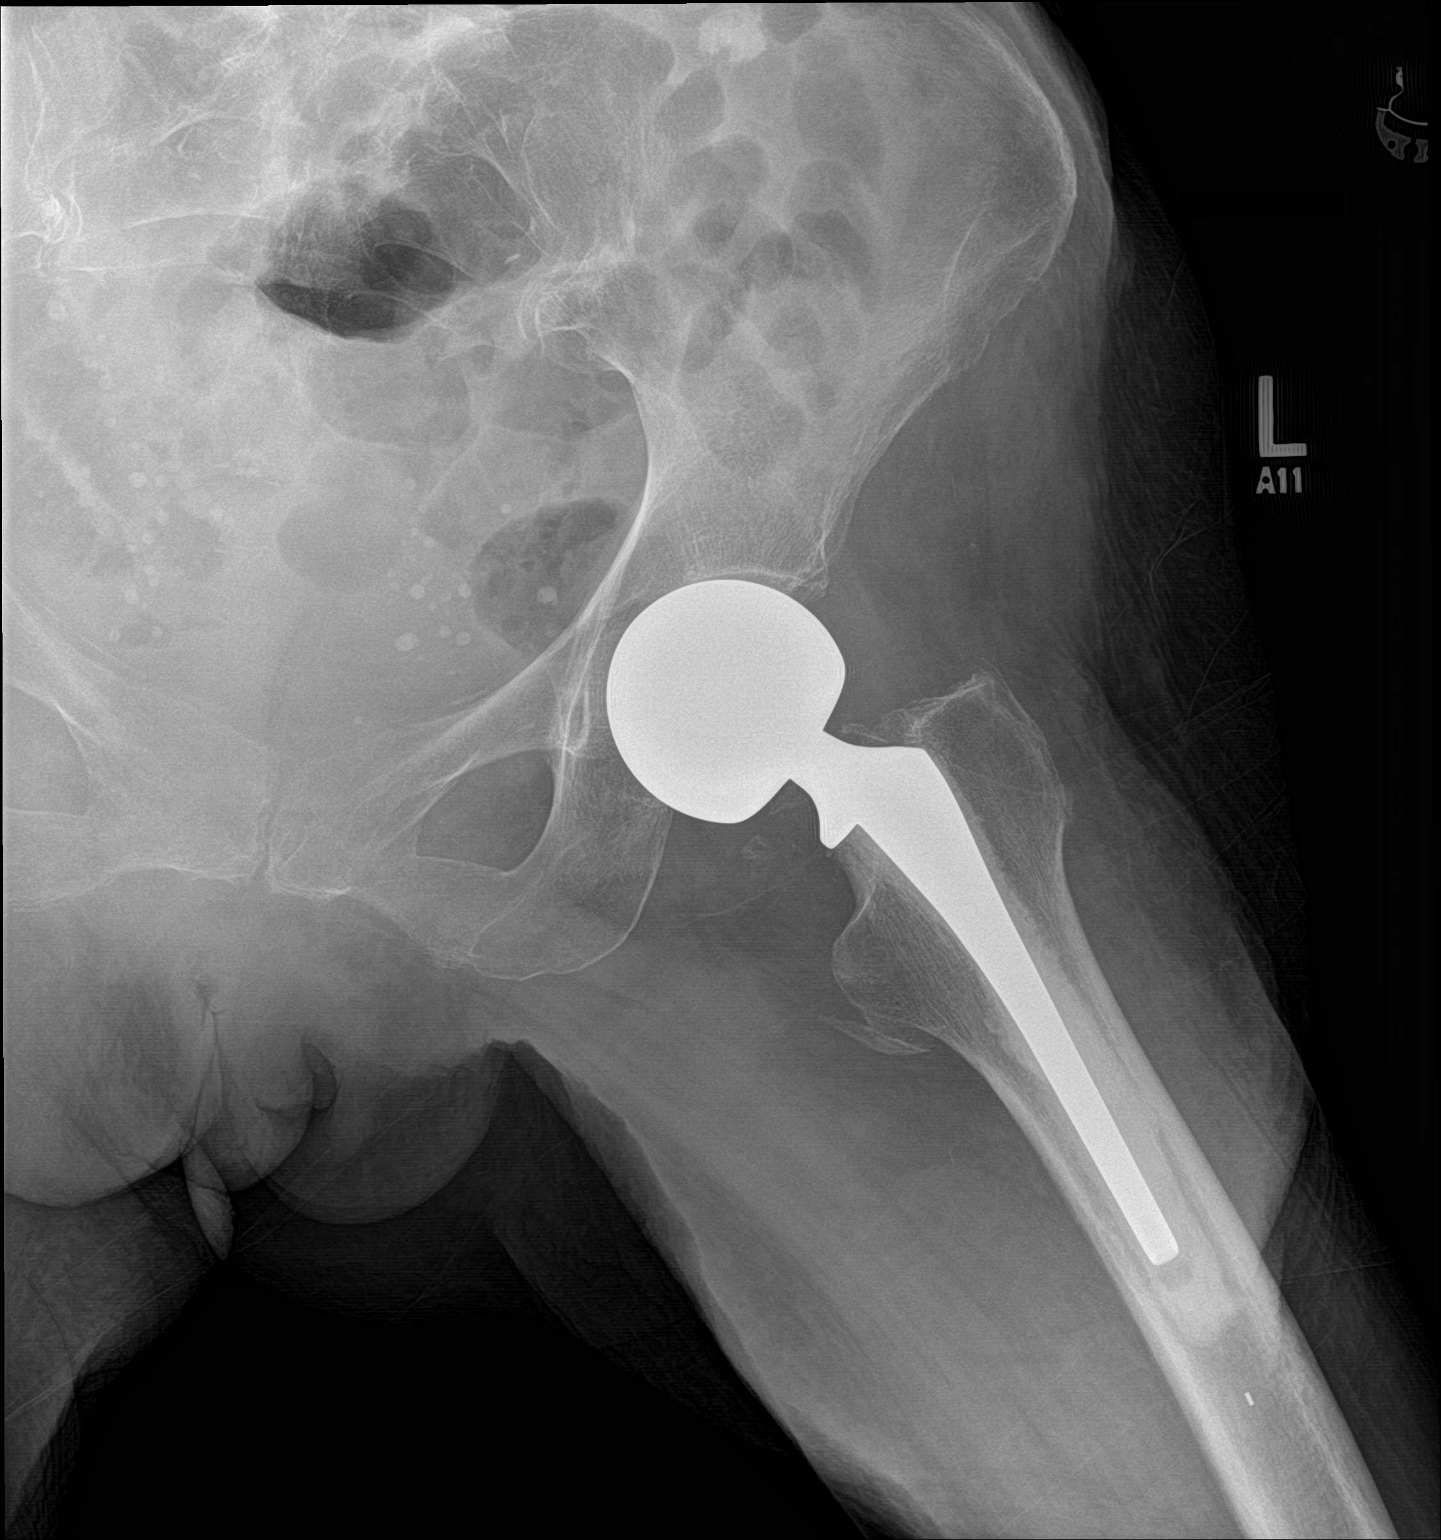

[3 of 3 positions shown; findings below may reference images not displayed]

FINDINGS: Status post left hip arthroplasty. No acute fracture or dislocation
is noted.
IMPRESSION: No acute abnormality seen in the left hip.
